# Patient Record
Sex: Male | Born: 1955 | Race: White | Hispanic: No | Smoking: Current some day smoker
Health system: Southern US, Community
[De-identification: ages and names within clinical notes are randomized; demographics above are authoritative.]

## PROBLEM LIST (undated history)

## (undated) DIAGNOSIS — K219 Gastro-esophageal reflux disease without esophagitis: Secondary | ICD-10-CM

## (undated) DIAGNOSIS — R0602 Shortness of breath: Secondary | ICD-10-CM

## (undated) DIAGNOSIS — B49 Unspecified mycosis: Secondary | ICD-10-CM

## (undated) DIAGNOSIS — Z8672 Personal history of thrombophlebitis: Secondary | ICD-10-CM

## (undated) DIAGNOSIS — K2960 Other gastritis without bleeding: Secondary | ICD-10-CM

## (undated) DIAGNOSIS — K208 Other esophagitis without bleeding: Secondary | ICD-10-CM

## (undated) DIAGNOSIS — F319 Bipolar disorder, unspecified: Secondary | ICD-10-CM

## (undated) DIAGNOSIS — I2510 Atherosclerotic heart disease of native coronary artery without angina pectoris: Secondary | ICD-10-CM

## (undated) DIAGNOSIS — M545 Low back pain, unspecified: Secondary | ICD-10-CM

## (undated) DIAGNOSIS — E118 Type 2 diabetes mellitus with unspecified complications: Secondary | ICD-10-CM

## (undated) DIAGNOSIS — I219 Acute myocardial infarction, unspecified: Secondary | ICD-10-CM

## (undated) DIAGNOSIS — E785 Hyperlipidemia, unspecified: Secondary | ICD-10-CM

## (undated) DIAGNOSIS — G8929 Other chronic pain: Secondary | ICD-10-CM

## (undated) DIAGNOSIS — J449 Chronic obstructive pulmonary disease, unspecified: Secondary | ICD-10-CM

## (undated) DIAGNOSIS — J189 Pneumonia, unspecified organism: Secondary | ICD-10-CM

## (undated) DIAGNOSIS — E669 Obesity, unspecified: Secondary | ICD-10-CM

## (undated) DIAGNOSIS — I252 Old myocardial infarction: Secondary | ICD-10-CM

## (undated) DIAGNOSIS — I209 Angina pectoris, unspecified: Secondary | ICD-10-CM

## (undated) DIAGNOSIS — J45909 Unspecified asthma, uncomplicated: Secondary | ICD-10-CM

## (undated) DIAGNOSIS — M1990 Unspecified osteoarthritis, unspecified site: Secondary | ICD-10-CM

## (undated) DIAGNOSIS — J42 Unspecified chronic bronchitis: Secondary | ICD-10-CM

## (undated) DIAGNOSIS — Z22322 Carrier or suspected carrier of Methicillin resistant Staphylococcus aureus: Secondary | ICD-10-CM

## (undated) DIAGNOSIS — Z9049 Acquired absence of other specified parts of digestive tract: Secondary | ICD-10-CM

## (undated) DIAGNOSIS — F419 Anxiety disorder, unspecified: Secondary | ICD-10-CM

## (undated) DIAGNOSIS — I82409 Acute embolism and thrombosis of unspecified deep veins of unspecified lower extremity: Secondary | ICD-10-CM

## (undated) DIAGNOSIS — E7800 Pure hypercholesterolemia, unspecified: Secondary | ICD-10-CM

## (undated) DIAGNOSIS — F329 Major depressive disorder, single episode, unspecified: Secondary | ICD-10-CM

## (undated) DIAGNOSIS — J984 Other disorders of lung: Secondary | ICD-10-CM

## (undated) DIAGNOSIS — G894 Chronic pain syndrome: Secondary | ICD-10-CM

## (undated) DIAGNOSIS — F250 Schizoaffective disorder, bipolar type: Secondary | ICD-10-CM

## (undated) DIAGNOSIS — I10 Essential (primary) hypertension: Secondary | ICD-10-CM

## (undated) DIAGNOSIS — E039 Hypothyroidism, unspecified: Secondary | ICD-10-CM

## (undated) DIAGNOSIS — K5732 Diverticulitis of large intestine without perforation or abscess without bleeding: Secondary | ICD-10-CM

## (undated) DIAGNOSIS — I509 Heart failure, unspecified: Secondary | ICD-10-CM

## (undated) DIAGNOSIS — Z95820 Peripheral vascular angioplasty status with implants and grafts: Secondary | ICD-10-CM

## (undated) HISTORY — DX: Other gastritis without bleeding: K29.60

## (undated) HISTORY — PX: TONSILLECTOMY AND ADENOIDECTOMY: SUR1326

## (undated) HISTORY — DX: Shortness of breath: R06.02

## (undated) HISTORY — DX: Carrier or suspected carrier of methicillin resistant Staphylococcus aureus: Z22.322

## (undated) HISTORY — DX: Acquired absence of other specified parts of digestive tract: Z90.49

## (undated) HISTORY — DX: Acute myocardial infarction, unspecified: I21.9

## (undated) HISTORY — PX: KNEE ARTHROSCOPY: SHX127

## (undated) HISTORY — DX: Schizoaffective disorder, bipolar type: F25.0

## (undated) HISTORY — DX: Chronic pain syndrome: G89.4

## (undated) HISTORY — DX: Pneumonia, unspecified organism: J18.9

## (undated) HISTORY — DX: Gastro-esophageal reflux disease without esophagitis: K21.9

## (undated) HISTORY — PX: APPENDECTOMY: SHX54

## (undated) HISTORY — DX: Atherosclerotic heart disease of native coronary artery without angina pectoris: I25.10

## (undated) HISTORY — PX: INGUINAL HERNIA REPAIR: SUR1180

## (undated) HISTORY — DX: Hyperlipidemia, unspecified: E78.5

## (undated) HISTORY — DX: Major depressive disorder, single episode, unspecified: F32.9

## (undated) HISTORY — DX: Other disorders of lung: J98.4

## (undated) HISTORY — DX: Type 2 diabetes mellitus with unspecified complications: E11.8

## (undated) HISTORY — DX: Unspecified mycosis: B49

## (undated) HISTORY — DX: Other esophagitis: K20.8

## (undated) HISTORY — DX: Other esophagitis without bleeding: K20.80

## (undated) HISTORY — DX: Diverticulitis of large intestine without perforation or abscess without bleeding: K57.32

## (undated) HISTORY — PX: LAPAROSCOPIC CHOLECYSTECTOMY: SUR755

## (undated) HISTORY — DX: Old myocardial infarction: I25.2

## (undated) HISTORY — DX: Personal history of thrombophlebitis: Z86.72

## (undated) HISTORY — DX: Obesity, unspecified: E66.9

---

## 1989-02-21 DIAGNOSIS — I82409 Acute embolism and thrombosis of unspecified deep veins of unspecified lower extremity: Secondary | ICD-10-CM

## 1989-02-21 HISTORY — DX: Acute embolism and thrombosis of unspecified deep veins of unspecified lower extremity: I82.409

## 2000-02-22 DIAGNOSIS — F259 Schizoaffective disorder, unspecified: Secondary | ICD-10-CM

## 2000-02-22 HISTORY — DX: Schizoaffective disorder, unspecified: F25.9

## 2004-05-18 ENCOUNTER — Observation Stay (HOSPITAL_COMMUNITY): Admission: EM | Admit: 2004-05-18 | Discharge: 2004-05-19 | Payer: Self-pay | Admitting: Emergency Medicine

## 2004-09-30 ENCOUNTER — Ambulatory Visit: Payer: Self-pay | Admitting: Psychiatry

## 2004-09-30 ENCOUNTER — Other Ambulatory Visit (HOSPITAL_COMMUNITY): Admission: RE | Admit: 2004-09-30 | Discharge: 2004-10-15 | Payer: Self-pay | Admitting: Psychiatry

## 2004-10-29 ENCOUNTER — Inpatient Hospital Stay (HOSPITAL_COMMUNITY): Admission: RE | Admit: 2004-10-29 | Discharge: 2004-11-08 | Payer: Self-pay | Admitting: Psychiatry

## 2004-11-10 ENCOUNTER — Ambulatory Visit: Payer: Self-pay | Admitting: Psychiatry

## 2004-11-10 ENCOUNTER — Other Ambulatory Visit (HOSPITAL_COMMUNITY): Admission: RE | Admit: 2004-11-10 | Discharge: 2004-11-10 | Payer: Self-pay | Admitting: Psychiatry

## 2004-11-11 ENCOUNTER — Inpatient Hospital Stay (HOSPITAL_COMMUNITY): Admission: RE | Admit: 2004-11-11 | Discharge: 2004-11-18 | Payer: Self-pay | Admitting: Psychiatry

## 2004-11-22 ENCOUNTER — Other Ambulatory Visit (HOSPITAL_COMMUNITY): Admission: RE | Admit: 2004-11-22 | Discharge: 2004-12-08 | Payer: Self-pay | Admitting: Psychiatry

## 2004-12-13 ENCOUNTER — Inpatient Hospital Stay (HOSPITAL_COMMUNITY): Admission: EM | Admit: 2004-12-13 | Discharge: 2004-12-15 | Payer: Self-pay | Admitting: Emergency Medicine

## 2004-12-22 ENCOUNTER — Ambulatory Visit (HOSPITAL_COMMUNITY): Payer: Self-pay | Admitting: Psychiatry

## 2005-01-03 ENCOUNTER — Ambulatory Visit (HOSPITAL_COMMUNITY): Payer: Self-pay | Admitting: Psychiatry

## 2005-01-11 ENCOUNTER — Ambulatory Visit (HOSPITAL_COMMUNITY): Payer: Self-pay | Admitting: Psychiatry

## 2005-01-16 ENCOUNTER — Emergency Department (HOSPITAL_COMMUNITY): Admission: EM | Admit: 2005-01-16 | Discharge: 2005-01-17 | Payer: Self-pay | Admitting: *Deleted

## 2005-01-17 ENCOUNTER — Inpatient Hospital Stay (HOSPITAL_COMMUNITY): Admission: RE | Admit: 2005-01-17 | Discharge: 2005-01-24 | Payer: Self-pay | Admitting: Psychiatry

## 2005-01-17 ENCOUNTER — Ambulatory Visit: Payer: Self-pay | Admitting: Psychiatry

## 2005-01-21 ENCOUNTER — Ambulatory Visit: Payer: Self-pay | Admitting: Psychiatry

## 2005-01-31 ENCOUNTER — Ambulatory Visit (HOSPITAL_COMMUNITY): Payer: Self-pay | Admitting: Psychiatry

## 2005-09-27 ENCOUNTER — Emergency Department (HOSPITAL_COMMUNITY): Admission: EM | Admit: 2005-09-27 | Discharge: 2005-09-28 | Payer: Self-pay | Admitting: Emergency Medicine

## 2005-10-08 ENCOUNTER — Emergency Department (HOSPITAL_COMMUNITY): Admission: EM | Admit: 2005-10-08 | Discharge: 2005-10-08 | Payer: Self-pay | Admitting: Emergency Medicine

## 2007-04-03 ENCOUNTER — Ambulatory Visit: Payer: Self-pay | Admitting: Internal Medicine

## 2007-04-03 ENCOUNTER — Emergency Department (HOSPITAL_COMMUNITY): Admission: EM | Admit: 2007-04-03 | Discharge: 2007-04-03 | Payer: Self-pay | Admitting: Emergency Medicine

## 2007-04-03 ENCOUNTER — Emergency Department (HOSPITAL_COMMUNITY): Admission: EM | Admit: 2007-04-03 | Discharge: 2007-04-03 | Payer: Self-pay | Admitting: Family Medicine

## 2007-04-18 ENCOUNTER — Ambulatory Visit: Payer: Self-pay | Admitting: *Deleted

## 2007-04-18 ENCOUNTER — Ambulatory Visit: Payer: Self-pay | Admitting: Internal Medicine

## 2007-07-01 ENCOUNTER — Emergency Department (HOSPITAL_COMMUNITY): Admission: EM | Admit: 2007-07-01 | Discharge: 2007-07-02 | Payer: Self-pay | Admitting: Emergency Medicine

## 2007-07-02 ENCOUNTER — Ambulatory Visit: Payer: Self-pay | Admitting: Internal Medicine

## 2007-12-07 ENCOUNTER — Ambulatory Visit: Payer: Self-pay | Admitting: Family Medicine

## 2008-01-27 ENCOUNTER — Inpatient Hospital Stay (HOSPITAL_COMMUNITY): Admission: EM | Admit: 2008-01-27 | Discharge: 2008-02-01 | Payer: Self-pay | Admitting: Emergency Medicine

## 2008-01-27 ENCOUNTER — Ambulatory Visit: Payer: Self-pay | Admitting: Internal Medicine

## 2008-01-29 ENCOUNTER — Encounter (INDEPENDENT_AMBULATORY_CARE_PROVIDER_SITE_OTHER): Payer: Self-pay | Admitting: Gastroenterology

## 2008-01-30 ENCOUNTER — Encounter (INDEPENDENT_AMBULATORY_CARE_PROVIDER_SITE_OTHER): Payer: Self-pay | Admitting: Gastroenterology

## 2008-02-22 DIAGNOSIS — I219 Acute myocardial infarction, unspecified: Secondary | ICD-10-CM

## 2008-02-22 HISTORY — DX: Acute myocardial infarction, unspecified: I21.9

## 2008-02-22 HISTORY — PX: CORONARY ANGIOPLASTY WITH STENT PLACEMENT: SHX49

## 2008-07-02 ENCOUNTER — Emergency Department (HOSPITAL_COMMUNITY): Admission: EM | Admit: 2008-07-02 | Discharge: 2008-07-02 | Payer: Self-pay | Admitting: Emergency Medicine

## 2008-07-07 ENCOUNTER — Emergency Department (HOSPITAL_COMMUNITY): Admission: EM | Admit: 2008-07-07 | Discharge: 2008-07-07 | Payer: Self-pay | Admitting: Emergency Medicine

## 2008-07-24 ENCOUNTER — Ambulatory Visit (HOSPITAL_COMMUNITY): Admission: RE | Admit: 2008-07-24 | Discharge: 2008-07-24 | Payer: Self-pay | Admitting: Family Medicine

## 2008-11-19 ENCOUNTER — Inpatient Hospital Stay (HOSPITAL_COMMUNITY): Admission: EM | Admit: 2008-11-19 | Discharge: 2008-11-21 | Payer: Self-pay | Admitting: Cardiology

## 2008-11-19 ENCOUNTER — Encounter: Payer: Self-pay | Admitting: Cardiology

## 2008-11-19 ENCOUNTER — Ambulatory Visit: Payer: Self-pay | Admitting: Cardiology

## 2008-11-20 ENCOUNTER — Encounter: Payer: Self-pay | Admitting: Cardiology

## 2008-11-25 ENCOUNTER — Telehealth: Payer: Self-pay | Admitting: Cardiology

## 2008-11-26 ENCOUNTER — Encounter: Payer: Self-pay | Admitting: Cardiology

## 2008-12-05 DIAGNOSIS — J449 Chronic obstructive pulmonary disease, unspecified: Secondary | ICD-10-CM

## 2008-12-05 DIAGNOSIS — J984 Other disorders of lung: Secondary | ICD-10-CM

## 2008-12-05 DIAGNOSIS — I2510 Atherosclerotic heart disease of native coronary artery without angina pectoris: Secondary | ICD-10-CM

## 2008-12-05 DIAGNOSIS — K2960 Other gastritis without bleeding: Secondary | ICD-10-CM

## 2008-12-05 DIAGNOSIS — E669 Obesity, unspecified: Secondary | ICD-10-CM

## 2008-12-05 DIAGNOSIS — F319 Bipolar disorder, unspecified: Secondary | ICD-10-CM

## 2008-12-05 DIAGNOSIS — F329 Major depressive disorder, single episode, unspecified: Secondary | ICD-10-CM

## 2008-12-05 DIAGNOSIS — I252 Old myocardial infarction: Secondary | ICD-10-CM

## 2008-12-05 DIAGNOSIS — K219 Gastro-esophageal reflux disease without esophagitis: Secondary | ICD-10-CM

## 2008-12-05 DIAGNOSIS — F17200 Nicotine dependence, unspecified, uncomplicated: Secondary | ICD-10-CM | POA: Insufficient documentation

## 2008-12-05 DIAGNOSIS — Z8672 Personal history of thrombophlebitis: Secondary | ICD-10-CM

## 2008-12-05 DIAGNOSIS — I119 Hypertensive heart disease without heart failure: Secondary | ICD-10-CM

## 2008-12-05 DIAGNOSIS — E785 Hyperlipidemia, unspecified: Secondary | ICD-10-CM

## 2008-12-05 HISTORY — DX: Other disorders of lung: J98.4

## 2008-12-05 HISTORY — DX: Other gastritis without bleeding: K29.60

## 2008-12-05 HISTORY — DX: Atherosclerotic heart disease of native coronary artery without angina pectoris: I25.10

## 2008-12-05 HISTORY — DX: Old myocardial infarction: I25.2

## 2008-12-05 HISTORY — DX: Gastro-esophageal reflux disease without esophagitis: K21.9

## 2008-12-05 HISTORY — DX: Personal history of thrombophlebitis: Z86.72

## 2008-12-05 HISTORY — DX: Major depressive disorder, single episode, unspecified: F32.9

## 2008-12-05 HISTORY — DX: Obesity, unspecified: E66.9

## 2008-12-09 ENCOUNTER — Encounter: Payer: Self-pay | Admitting: Nurse Practitioner

## 2008-12-09 ENCOUNTER — Ambulatory Visit: Payer: Self-pay | Admitting: Cardiology

## 2008-12-26 ENCOUNTER — Emergency Department (HOSPITAL_COMMUNITY): Admission: EM | Admit: 2008-12-26 | Discharge: 2008-12-26 | Payer: Self-pay | Admitting: Emergency Medicine

## 2009-01-05 ENCOUNTER — Observation Stay (HOSPITAL_COMMUNITY): Admission: EM | Admit: 2009-01-05 | Discharge: 2009-01-05 | Payer: Self-pay | Admitting: Emergency Medicine

## 2009-03-26 ENCOUNTER — Encounter (INDEPENDENT_AMBULATORY_CARE_PROVIDER_SITE_OTHER): Payer: Self-pay | Admitting: *Deleted

## 2010-03-23 NOTE — Letter (Signed)
Summary: Appointment - Missed  [facility], Main Office  [address]   [place], [place] [address]   Phone: [phone]  Fax: [phone]     March 26, 2009 MRN: [id number]   [name] [address] [place], [place]  [address]   Dear [name],  Our records indicate you missed your appointment on March 18, 2009  with Dr. Juanda Chance. It is very important that we reach you to reschedule this appointment. We look forward to participating in your health care needs. Please contact us at the number listed above at your earliest convenience to reschedule this appointment.     Sincerely, Neurosurgeon Team LG

## 2010-05-26 LAB — COMPREHENSIVE METABOLIC PANEL
ALT: 23 U/L (ref 0–53)
AST: 27 U/L (ref 0–37)
Albumin: 4.2 g/dL (ref 3.5–5.2)
Alkaline Phosphatase: 55 U/L (ref 39–117)
BUN: 5 mg/dL — ABNORMAL LOW (ref 6–23)
CO2: 24 mEq/L (ref 19–32)
Calcium: 8.7 mg/dL (ref 8.4–10.5)
Chloride: 104 mEq/L (ref 96–112)
Creatinine, Ser: 0.71 mg/dL (ref 0.4–1.5)
GFR calc Af Amer: >60 mL/min (ref >60)
GFR calc non Af Amer: >60 mL/min (ref >60)
Glucose, Bld: 105 mg/dL — ABNORMAL HIGH (ref 70–99)
Potassium: 3.3 mEq/L — ABNORMAL LOW (ref 3.5–5.1)
Sodium: 137 mEq/L (ref 135–145)
Total Bilirubin: 0.6 mg/dL (ref 0.3–1.2)
Total Protein: 7.2 g/dL (ref 6.0–8.3)

## 2010-05-26 LAB — URINALYSIS, ROUTINE W REFLEX MICROSCOPIC
Bilirubin Urine: NEGATIVE
Glucose, UA: NEGATIVE mg/dL
Hgb urine dipstick: NEGATIVE
Ketones, ur: NEGATIVE mg/dL
Nitrite: NEGATIVE
Protein, ur: NEGATIVE mg/dL
Specific Gravity, Urine: 1.012 (ref 1.005–1.030)
Urobilinogen, UA: 2 mg/dL — ABNORMAL HIGH (ref 0.0–1.0)
pH: 6 (ref 5.0–8.0)

## 2010-05-26 LAB — CBC
HCT: 41.6 % (ref 39.0–52.0)
Hemoglobin: 14.6 g/dL (ref 13.0–17.0)
MCHC: 35.1 g/dL (ref 30.0–36.0)
MCV: 92.8 fL (ref 78.0–100.0)
Platelets: 149 10*3/uL — ABNORMAL LOW (ref 150–400)
RBC: 4.48 MIL/uL (ref 4.22–5.81)
RDW: 14 % (ref 11.5–15.5)
WBC: 7.1 10*3/uL (ref 4.0–10.5)

## 2010-05-26 LAB — DIFFERENTIAL
Basophils Absolute: 0 10*3/uL (ref 0.0–0.1)
Basophils Relative: 0 % (ref 0–1)
Eosinophils Absolute: 0.1 10*3/uL (ref 0.0–0.7)
Eosinophils Relative: 2 % (ref 0–5)
Lymphocytes Relative: 24 % (ref 12–46)
Lymphs Abs: 1.7 10*3/uL (ref 0.7–4.0)
Monocytes Absolute: 0.4 10*3/uL (ref 0.1–1.0)
Monocytes Relative: 6 % (ref 3–12)
Neutro Abs: 4.8 10*3/uL (ref 1.7–7.7)
Neutrophils Relative %: 68 % (ref 43–77)

## 2010-05-26 LAB — LIPASE, BLOOD: Lipase: 11 U/L (ref 11–59)

## 2010-05-28 LAB — CBC
HCT: 42.5 % (ref 39.0–52.0)
Hemoglobin: 14.7 g/dL (ref 13.0–17.0)
MCHC: 34.4 g/dL (ref 30.0–36.0)
MCV: 93.5 fL (ref 78.0–100.0)
Platelets: 138 10*3/uL — ABNORMAL LOW (ref 150–400)
Platelets: 161 10*3/uL (ref 150–400)
RBC: 4.6 MIL/uL (ref 4.22–5.81)
WBC: 7.5 10*3/uL (ref 4.0–10.5)
WBC: 8 10*3/uL (ref 4.0–10.5)

## 2010-05-28 LAB — CARDIAC PANEL(CRET KIN+CKTOT+MB+TROPI)
CK, MB: 2.1 ng/mL (ref 0.3–4.0)
CK, MB: 31 ng/mL — ABNORMAL HIGH (ref 0.3–4.0)
Relative Index: INVALID (ref 0.0–2.5)
Total CK: 382 U/L — ABNORMAL HIGH (ref 7–232)
Total CK: 495 U/L — ABNORMAL HIGH (ref 7–232)
Total CK: 67 U/L (ref 7–232)
Troponin I: 0.06 ng/mL (ref 0.00–0.06)
Troponin I: 7.36 ng/mL (ref 0.00–0.06)

## 2010-05-28 LAB — HEMOGLOBIN A1C: Mean Plasma Glucose: 103 mg/dL

## 2010-05-28 LAB — TSH: TSH: 2.211 u[IU]/mL (ref 0.350–4.500)

## 2010-05-28 LAB — COMPREHENSIVE METABOLIC PANEL
AST: 51 U/L — ABNORMAL HIGH (ref 0–37)
Albumin: 3.5 g/dL (ref 3.5–5.2)
Albumin: 4.1 g/dL (ref 3.5–5.2)
Alkaline Phosphatase: 55 U/L (ref 39–117)
BUN: 7 mg/dL (ref 6–23)
CO2: 20 mEq/L (ref 19–32)
Calcium: 8.1 mg/dL — ABNORMAL LOW (ref 8.4–10.5)
Chloride: 105 mEq/L (ref 96–112)
Creatinine, Ser: 0.87 mg/dL (ref 0.4–1.5)
GFR calc Af Amer: >60 mL/min (ref >60)
GFR calc non Af Amer: >60 mL/min (ref >60)
Glucose, Bld: 136 mg/dL — ABNORMAL HIGH (ref 70–99)
Potassium: 3.6 mEq/L (ref 3.5–5.1)
Total Bilirubin: 0.8 mg/dL (ref 0.3–1.2)

## 2010-05-28 LAB — POCT I-STAT, CHEM 8
BUN: 6 mg/dL (ref 6–23)
Creatinine, Ser: 0.8 mg/dL (ref 0.4–1.5)
Sodium: 137 mEq/L (ref 135–145)
TCO2: 19 mmol/L (ref 0–100)

## 2010-05-28 LAB — LIPID PANEL
HDL: 25 mg/dL — ABNORMAL LOW (ref >39)
Triglycerides: 436 mg/dL — ABNORMAL HIGH (ref <150)
VLDL: UNDETERMINED mg/dL (ref 0–40)

## 2010-07-06 NOTE — Op Note (Signed)
NAME:  [name], CALI NO.:  [id number]   MEDICAL RECORD NO.:  [id number]          PATIENT TYPE:  INP   LOCATION:  5532                         FACILITY:  [facility]   PHYSICIAN:  Danise Edge, M.D.   DATE OF BIRTH:  26-Oct-1955   DATE OF PROCEDURE:  01/29/2008  DATE OF DISCHARGE:                               OPERATIVE REPORT   PROCEDURE INDICATIONS:  [name] is a 55 year old male, born  07-Apr-1955.  [name] was admitted to [facility] to  evaluate abdominal pain, diarrhea, and Hemoccult positive stool.  He  underwent a CT scan of the abdomen and pelvis which revealed thickening  in the distal esophageal wall.   ENDOSCOPIST:  Danise Edge, MD   PREMEDICATIONS:  Fentanyl 75 mcg and Versed 7.5 mg.   PROCEDURE:  After obtaining informed consent, [name] was placed in  the left lateral decubitus position.  I administered intravenous  fentanyl and intravenous Versed to achieve conscious sedation for the  procedure.  The patient's blood pressure, oxygen saturation, and cardiac  rhythm were monitored throughout the procedure and documented in the  medical record.   The Pentax gastroscope was passed through the posterior hypopharynx into  the proximal esophagus without difficulty.  The hypopharynx, larynx, and  vocal cords appeared normal.   Esophagoscopy:  The proximal and mid segments of the esophageal mucosa  appeared normal.  There are linear erosions in the distal esophagus  extending up from the esophagogastric junction which is noted at  approximately 45 cm from the incisor teeth.  There is no endoscopic  evidence for the presence of Barrett esophagus or esophageal tumors.  Biopsies were performed from the distal esophagus.   Gastroscopy:  Retroflex view of the gastric cardia and fundus was  normal.  The gastric body, antrum, and pylorus appeared normal.  The  pylorus is patent.   Duodenoscopy:  The duodenal bulb and descending  duodenum appeared  normal.   ASSESSMENT:  Gastroesophageal reflux associated with linear erosions in  the distal esophagus, but no endoscopic evidence for the presence of  Barrett esophagus or esophageal tumors.  Distal esophageal biopsies were  performed.   RECOMMENDATIONS:  1. Continue proton pump inhibitor at each morning.  2. Schedule colonoscopy to evaluate guaiac-positive stool.           ______________________________  Danise Edge, M.D.    MJ/MEDQ  D:  01/29/2008  T:  01/29/2008  Job:  [id number]

## 2010-07-06 NOTE — Consult Note (Signed)
[name], [name]:  [id number]   MEDICAL RECORD NO.:  [id number]          PATIENT TYPE:  INP   LOCATION:  5532                         FACILITY:  [facility]   PHYSICIAN:  Danise Edge, M.D.   DATE OF BIRTH:  08-08-1955   DATE OF CONSULTATION:  01/28/2008  DATE OF DISCHARGE:                                 CONSULTATION   PRIMARY CARE PHYSICIAN:  Windle Guard, MD in [place].   PRIMARY GASTROENTEROLOGIST:  None.   Consulting gastroenterologist:  Dr. Vida Rigger   REASON FOR CONSULTATION:  Epigastric pain, FOBT positive, question of  esophageal thickening on CT scan.   HISTORY OF PRESENT ILLNESS:  The patient is a 55 year old male with past  medical history of bipolar disorder, GERD, dyslipidemia, COPD, obesity,  and suicide attempt in 2006, who presented to the emergency room via EMS  complaining of upper abdominal pain that started the day prior to  admission and diarrhea that started the day of admission.  He notes  upper abdominal pain that is constant and aching in nature and  associated with nausea, but no vomiting.  He then developed diarrhea the  next day, but denies melena or hematochezia.  He has not traveled  anywhere recently and has not eaten anything out of the ordinary.  He  resides in a nursing home and several of the other residents have had GI  upset recently.  He denies any recent antibiotic use.  He notes using  ibuprofen 400 mg b.i.d. for 1 week last week, but no other NSAID use.  He notes starting niacin the day before his symptoms started, but no  other medication changes have been made.  He says he had EGD and  colonoscopy about 30 years ago and was notable for GERD.  He has no  family history of GI malignancy.  He has had a 40-to- 50-pound weight  loss in the last year, but notes that this has been intentional and has  been exercising more.  He denies fevers, chills, hematochezia,  hematemesis, and melena.  He also notes URI  symptoms for the last 5 days  prior to admission.  He uses tobacco, but denies any illegal drugs or  alcohol.   PAST MEDICAL HISTORY:  1. GERD - EGD/colonoscopy 30 years ago.  2. Bipolar disorder, followed by the [facility].  3. History of suicide attempts in 2006.  4. Obesity.  5. Dyslipidemia.  6. Tobacco dependence.  7. Herniated disk.  8. COPD.  9. History of DVT, post knee arthroscopy, status post Coumadin      therapy.   PAST SURGICAL HISTORY:  1. Appendectomy.  2. Hernia repair.  3. Tonsillectomy.  4. Knee arthroscopy.   MEDICATIONS:  1. Neurontin 600 mg p.o. t.i.d.  2. Trazodone 300 mg p.o. nightly.  3. Benztropine 1 mg p.o. b.i.d.  4. Cymbalta 60 mg p.o. b.i.d.  5. Seroquel 30 mg p.o. t.i.d.  6. Flexeril p.r.n.  7. Albuterol p.r.n.  8. Protonix 40 mg p.o. daily.  9. Niacin p.o. daily.  10.Ambien p.o. nightly p.r.n.  11.Sudafed p.r.n.   ALLERGIES:  COMPAZINE.   SOCIAL HISTORY:  He is a divorced gentleman who currently resides in a  nursing home.  He is on disability.  He smokes a half a pack a day for  many years, but denies alcohol.  No illegal drugs.   FAMILY HISTORY:  He notes coronary artery disease in his parents.  His  grandmother had breast cancer.  No history of GI malignancy in any  family members.   PHYSICAL EXAMINATION:  VITAL SIGNS:  Temperature max 98.8, blood  pressure 96/59, heart rate 90, respiratory rate 16, and O2 sat 95% on  room air.  GENERAL:  He is alert and oriented x3, appears slightly anxious.  HEENT:  Sclerae anicteric, moist mucous membranes, NG tube in place.  CARDIOVASCULAR:  Regular rate and rhythm, distant heart sounds, no  murmurs, rubs, or gallops.  LUNGS:  Clear to auscultation bilaterally anteriorly.  ABDOMEN:  Hypoactive bowel sounds, obese, soft, nondistended, tender to  palpation in the right upper quadrant, epigastrium, and left upper  quadrant without rebound or guarding.  Well-healed incision noted in the   right lower quadrant.  EXTREMITIES:  No peripheral edema.   LABORATORY DATA:  White blood cell count 10.6, hemoglobin 15.2, platelet  count 150, and MCV 93.2.  Sodium 137, potassium 4.7, chloride 103,  bicarb 24, BUN 9, creatinine 0.88, glucose 100, total bilirubin 1.0,  alkaline phosphatase 54, AST 28, ALT 25, total protein 6.7, albumin 4.0,  and calcium 9.3.  Hemoccult positive, 1out of 2.  TSH 3.622, free T4 is  0.92, LDH 140, and lipase 15.  Lactic acid 3.1.  Urinalysis within  normal limits except for elevated specific gravity of greater than  1.046.   DIAGNOSTIC IMAGING:  1. Acute abdominal series with a question of ileus versus small bowel      obstructions.  2. CAT scan of the abdomen and pelvis with:      a.     Esophageal wall thickening.      b.     Question of partial small bowel obstruction.      c.     A 6-mm pulmonary nodule.   ASSESSMENT/PLAN:  The patient is a 55 year old male with:  1. Epigastric pain/diarrhea:  Unsure if these are actually related at      this point.  Differential to include infectious gastroenteritis      such as viral, Clostridium difficile given history of residing in a      nursing home, esophagitis, peptic ulcer disease, given that he is      hemoccult positive and having significant pain.  I will also      consider malignancy given his history of weight loss.  Medications      may also be contributing to his symptoms.  At this point, we will      proceed with EGD tomorrow to evaluate the esophagus given      abnormality seen on CAT scan.  We will discontinue the patient's NG-      tube given that he is having bowel movements and so does not have a      complete small bowel obstruction and we will start on clear      liquids.  We will make him n.p.o. after midnight for EGD tomorrow.      We will provide symptomatic relief if needed.  Will followup his      stool studies to include stools for enteric pathogens, fecal      lactoferrin,  and C.  diff toxin.  The patient may also benefit from      colonoscopy at some point, especially given that he is hemoccult      positive, however, pending the results of his EGD, this may be able      to be done in the outpatient setting.      Joaquin Courts, MD  Electronically Signed     ______________________________  Danise Edge, M.D.    VW/MEDQ  D:  01/28/2008  T:  01/29/2008  Job:  [id number]

## 2010-07-06 NOTE — Op Note (Signed)
Kenneth, Ray NO.:  [id number]   MEDICAL RECORD NO.:  [id number]          PATIENT TYPE:  INP   LOCATION:  5532                         FACILITY:  [facility]   PHYSICIAN:  Danise Edge, M.D.   DATE OF BIRTH:  1955/06/18   DATE OF PROCEDURE:  01/30/2008  DATE OF DISCHARGE:                               OPERATIVE REPORT   REFERRING PHYSICIAN:  Alvester Morin, MD   OPERATION:  Colonoscopy with colonic biopsy.   PROCEDURE INDICATIONS:  [name] is a 55 year old male born on  [birth year].  [name] to the hospital to evaluate  nonbloody diarrhea and Hemoccult-positive stool.  A CT scan of the  abdomen and pelvis revealed thickening in his distal esophagus.  An  esophagogastroduodenoscopy performed yesterday was essentially normal  except for linear erosions in the distal esophagus.  To evaluate his  guaiac-positive stool, he is undergoing a diagnostic colonoscopy.  To  rule out microscopic colitis, random colonic biopsies will be performed.   ENDOSCOPIST:  Danise Edge, MD   PREMEDICATIONS:  1. Fentanyl 75 mcg.  2. Versed 5 mg.   PROCEDURE:  After obtaining informed consent, [name] was placed in  the left lateral decubitus position.  I administered intravenous  fentanyl and intravenous Versed to achieve conscious sedation for the  procedure.  The patient's blood pressure, oxygen saturation, and cardiac  rhythm were monitored throughout the procedure and documented in the  medical record.   An anal inspection and digital rectal exam were normal.  The Pentax  pediatric colonoscope was introduced into the rectum and advanced to the  mid ascending colon.  With a fair amount of difficulty rolling the  patient from the left lateral decubitus position to the supine position  and the right lateral decubitus position applying abdominal pressure, I  eventually was able to advance the endoscope into the cecum.  Colonic  preparation  for the exam today was good.   Rectum normal.  Retroflexed view of the distal rectum normal.   Sigmoid colon and descending colon.  A few sigmoid colonic diverticula  are noted.  Otherwise, the exam was normal.   Splenic flexure normal.   Transverse colon normal.   Hepatic flexure normal.   Descending colon.  A few diverticula are present in the ascending colon.   Cecum and ileocecal valve normal.   BIOPSIES:  Random colonic biopsies were taken from the right colon and  left colon to rule out microscopic colitis.   ASSESSMENT:  Normal proctocolonoscopy to the cecum.  A few diverticula  are noted in the ascending colon and sigmoid colon.  No endoscopic  evidence for the presence of inflammatory bowel disease or colorectal  neoplasia.  Colonic biopsies to rule out microscopic colitis pending.   RECOMMENDATIONS:  Repeat screening colonoscopy to prevent colon cancer  in 10 years.           ______________________________  Danise Edge, M.D.     MJ/MEDQ  D:  01/30/2008  T:  01/30/2008  Job:  [id number]   cc:   [name], M.D.

## 2010-07-06 NOTE — Discharge Summary (Signed)
NAMEMANDRELL, VANGILDER NO.:  [id number]   MEDICAL RECORD NO.:  [id number]          PATIENT TYPE:  INP   LOCATION:  5501                         FACILITY:  [facility]   PHYSICIAN:  Ileana Roup, M.D.  DATE OF BIRTH:  1955-12-25   DATE OF ADMISSION:  01/27/2008  DATE OF DISCHARGE:  02/01/2008                               DISCHARGE SUMMARY   ADDENDUM   The patient has to stay in the hospital for extra 1 day.  Now his vital  signs have been stable during the extra 1 day.  There are no significant  changes to the discharge summary.  The patient is advised to continue  all the medication that I mentioned in the previously dictated discharge  summary.  The patient is to go to his assisted living facility [facility] [employer]) and he is to follow up with his primary  care physician, Dr. Jeannetta Nap, on February 18, 2008, at 10:30 a.m.  Apparently on the day of discharge, the patient's heparin antibody  screen confirmatory test came back negative, so the patient is not  allergic to heparin.      Blondell Reveal, MD  Electronically Signed      Ileana Roup, M.D.  Electronically Signed    VB/MEDQ  D:  02/01/2008  T:  02/01/2008  Job:  [id number]   cc:   Buren Kos, M.D.

## 2010-07-06 NOTE — Discharge Summary (Signed)
[name], OBESO NO.:  [id number]   MEDICAL RECORD NO.:  [id number]          PATIENT TYPE:  INP   LOCATION:  5501                         FACILITY:  [facility]   PHYSICIAN:  Ileana Roup, M.D.  DATE OF BIRTH:  Dec 19, 1955   DATE OF ADMISSION:  01/27/2008  DATE OF DISCHARGE:                               DISCHARGE SUMMARY   DISCHARGE DIAGNOSES   1. Abdominal pain, most likely secondary to erosive esophagitis (upper      GI endoscopy proven).  2. Lung nodule in left lower lobe (6-mm size on CT scan), needs repeat      CT scan in 6 months.  3. Chronic obstructive pulmonary disease/emphysema.  4. Bipolar disease disorder/major depression.  5. Herniated disk at L4-L5.  6. Obesity.  7. Hypertension, not sure if the patient really has high blood      pressure, not on any medications.  8. Gastroesophageal reflux disease.  9. Hyperlipidemia.  10.History of provoked deep vein thrombosis after arthroscopy of knee.  11.History of Klonopin overdose in 2006.  12.Heparin antibody screen positive, confirmatory test pending.  13.Tobacco abuse, ongoing.  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   DISCHARGE MEDICATIONS   1. Neurontin 600 mg 1 pill 3 times daily.  2. Trazodone 300 mg 1 pill at bedtime.  3. Benztropine 1 mg 1 pill 2 times daily.  4. Cymbalta 60 mg 1 pill 2 times daily.  5. Seroquel 300 mg 1 pill 3 times daily.  6. Cyclobenzaprine 1 pill as needed.  7. Albuterol 1-2 puffs as needed for shortness of breath.  8. Pravastatin 40 mg 1 pill once daily.  9. Protonix 40 mg 1 pill once daily 30 minutes before breakfast.  10.Ambien 10 mg 1 pill at bedtime as needed for insomnia.  11.Percocet 10/650 mg 1 pill every 6 hours as needed for pain.  12.Sudafed 1 tablet as needed for nasal congestion.  13.The patient is advised to stop smoking.   DISPOSITION AND FOLLOWUP   The  patient is to follow up with Dr. Jeannetta Nap ([facility]) on February 18, 2008, at 10:30 a.m. the phone number of Dr.  Jeannetta Nap is [phone].  Dr. Jeannetta Nap is to monitor the patient's abdominal  pain.  Dr. Jeannetta Nap is to follow up with him in 6 months and he is to  advise regarding repeating CT scan of his chest to make sure that the  lung nodules are not increasing in size.  Dr. Jeannetta Nap is to advise  regarding smoking cessation.   PROCEDURES PERFORMED   1. Acute abdominal series done on January 27, 2008.  Impression, no      acute cardiopulmonary process.  Diffuse gaseous small bowel      distention with air scattered along the nondistended colon.  There      is a large volume of residual debris in the stomach.  Imaging      features could be secondary to severe ileus although small bowel      obstruction is a concern.  CT imaging maybe be helpful to further      evaluate.   1. CT of the abdomen and pelvis: impression, there is a      circumferential distal esophageal wall thickening maybe related to      reflux or esophagitis.  The followup endoscopy or barium swallow      would be helpful to further evaluate. A 6-mm pulmonary nodule is      seen in the left lower lobe.  The nodule is incompletely      visualized.  A 4-mm nodule is seen in the left lower lobe on image      30 and 2 small nodules in the left lower lobe.  A followup chest CT      could be performed at 6-12 months. Fluid-filled distended stomach      and proximal small bowel.  Diffuse fatty infiltration of the liver.      There is no focal abnormality in the liver or spleen.  The stomach      is distended.  The duodenum, pancreas, gallbladder, and retinal      glands have normal imaging features.  There is no intraperitoneal      free fluid.  No abdominal lymphadenopathy.  Scattered diverticula      are seen in the abdominal segments of the colon.  CT scan of the      pelvis shows impression of fluid-filled  distended small bowel in      the abdomen and pelvis.  This is associated with fluid-filled      distended stomach.  The patient has air and fluid in the distal      ilium and the proximal colon.  Imaging features may be secondary to      an early or partial small bowel obstruction.  Given the relative      lack of colon distention, ileus is considered less likely at this      time.  There is no evidence of bowel wall thickening.  No      intraperitoneal free fluid or mesenteric interloop fluid.   1. Upper GI endoscopy.  Gastroesophageal reflux associated with linear      erosions in the distal esophagus, but no endoscopic evidence for      the presence of Barrett esophagus or esophageal tumors.  Distal      esophageal biopsies were performed.  Recommend continuing the      proton pump inhibitors at each morning.  Scheduled colonoscopy to      evaluate guaiac-positive stools.   1. Colonoscopy: Assessment of the colonoscopy normal,      proctocolonoscopy to the cecum.  A few diverticula are noted in the      ascending colon and sigmoid colon.  No endoscopic evidence for the      presence of inflammatory bowel disease or colorectal neoplasia.      Recommendations include repeat screening colonoscopy to prevent      colon cancer in 10 years.   1. Biopsy of the esophagus. Inflamed gastroesophageal junction mucosa      with focal erosion.  No fungi, intestinal metaplasia, dysplasia, or      malignancy identified.   1. Biopsy of the colon, benign colonic mucosa.  No significant      inflammation or other abnormalities identified.  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    LABS  Serum lipase 15.  Comprehensive metabolic panel; sodium  134, potassium  4.4, chloride 102, carbon dioxide 19, glucose 134, BUN 11, creatinine  0.84, total bilirubin 0.8, alkaline phosphatase 61, AST 28, ALT 22,  total protein 7.7, albumin 4.7, and calcium 10.3.  Fecal  occult blood  test is negative.  Venous lactic acid level 3.1, which is slightly  elevated.  Blood alcohol level is less than 5, blood LDH level is 140,  hemoglobin A1c 5.3, TSH 3.22, free T4 0.92, blood acetaminophen level is  less than 10, and blood salicylate level is less than 4.  Urinalysis is  negative for nitrites and leukocytes.  Repeat fecal occult blood test is  positive.  Fasting lipid profile; total cholesterol 167, triglycerides  497, HDL cholesterol 30, LDL cholesterol unable to calculate because  triglycerides are over 400.  VLDL cholesterol unable to calculate  because triglycerides are more than 400.  Clostridium difficile toxins  negative.  Fecal lactoferrin positive.  GRDR screen negative.  Cryptosporidium screen negative.  Heparin antibody screen positive, but  this is sent for confirmation and confirmatory test is pending.  PTT 30,  PT 13.8, and INR 1.0.  Stool culture negative for Salmonella, Shigella,  Campylobacter, or Yersinia.   CONSULTATIONS   1. Gastrointestinal consultation: Dr. Danise Edge.  Dr. Laural Benes was      consulted for GI secondary to slightly elevated lactic acid levels      and to rule out the possibility of mesenteric ischemia and for a      possible colonoscopy for a positive fecal occult blood test.  Dr.      Laural Benes recommended upper GI endoscopy to rule out the upper GI      lesions and colonoscopy to rule out the possibility of colonic      neoplasia.   BRIEF ADMITTING HISTORY AND PHYSICAL   He is a 55 year old Caucasian with past medical history of COPD,  hypertension, hyperlipidemia, gastroesophageal reflux disease, obesity,  bipolar disease and bipolar disorder, and major depression, comes to the  emergency room with chief complaints of abdominal pain, diarrhea, and  nausea since 7 p.m. last night.  Complaints have started with diarrhea,  6 times, watery, nonbloody, no mucus.  Abdominal pain started in  umbilical area, crampy and  intermittent and lasted a few hours.  Then  abdominal pain started in upper abdomen (mostly epigastric) constant,  burning type, and 10/10 in severity associated with nausea.  The patient  denies any vomitings, fevers, sick contacts, or new food.  He had [place]  for dinner and other people who ate the same food had no similar  complaints.  He noticed some dark-colored urine this a.m.  Denies any  shortness of breath and chest pain.  Denies any other complaints.  Apparently, the patient describes significant weight loss (questionable  intentional) from 320 lbs to 276 lbs, over the last 1 year.  The patient  denies any fatigue, palpitations, or suicidal ideations.   PHYSICAL EXAMINATION   VITAL SIGNS:  Temperature 97.3, blood pressure 141/95, pulse rate 102,  respiratory 20, and oxygen saturation 96% on room air.  Weight 276 lbs.  GENERAL:  The patient is moderately obese, appears in mild distress  secondary to abdominal pain and looks dehydrated.  EYES:  Pupils equal, round, and reacting to light equal.  Extraocular  movements intact.  No anemia or icterus noted.  ENT:  Moist mucous membranes.  No thrush or erythema.  NECK:  Supple.  No lymph nodes.  No carotid bruit.  RESPIRATORY:  Clear to auscultation bilaterally.  No wheezes.  No  crepitus.  No rhonchi.  CARDIOVASCULAR:  S1 and S2 regular in rate and rhythm.  No murmurs.  No  rubs.  No gallops.  GASTROINTESTINAL:  Abdomen is soft, obese, questionable mild distention,  and tenderness to palpation in the epigastric area and right upper  quadrant and left upper quadrant areas.  No guarding.  No rigidity.  No  fluid wave.  Bowel sounds positive.  Guaiac is negative.  Normal rectal  tone.  EXTREMITIES:  No pedal edema.  SKIN:  Looks dehydrated.  No erythema or any lesions.  LYMPH:  No palpable lymphadenopathy.  The patient is moving all  extremities.  NEUROLOGIC:  Nonfocal.  Motor strength is 5/5 in all 4 extremities.  Sensation is  intact.  Cranial nerves are II through XII are grossly  intact.  Cerebellar signs intact.  PSYCH:  Appropriate.   LABS AT THE TIME OF PRESENTATION   1. At the  time of presentation, CBC; white count of 11.9, RBC 5.29,      hemoglobin 16.7, hematocrit 49.3, MCV 93.2, MCHC 33.9, RDW 13.4,      and platelet count 182.  2. Comprehensive metabolic panel; sodium 134, potassium 4.4, chloride      102, carbon dioxide 19, glucose 134, BUN 11, creatinine 0.84, total      bilirubin 0.8, alkaline phosphatase 61, AST 28, ALT 22, total      protein 7.7, albumin in blood 4.7, and calcium 10.3.   HOSPITAL COURSE   1. Abdominal pain, nausea, and diarrhea.  Initially, the differentials      were very broad and included small bowel obstruction (partial small      bowel obstruction), peptic ulcer disease, gastroesophageal reflux      disease, mesenteric ischemia, acute pancreatitis, and infectious      gastroenteritis.  Initially, the serum lipase is negative ruling      out the possibility of acute pancreatitis and abdominal x-ray      showed gaseous dilatation of small bowels and distention of the      stomach with free air fluid levels suggesting a diagnosis of      partial small bowel obstruction.  We put a nasogastric tube and      drained more than 2 L of fluid.  We treated him symptomatically      with IV fluids for pain control with morphine and antiemetics.  The      patient's nausea and diarrhea resolved the next day.  The patient's      abdominal pain got better.  Secondary to his slightly elevated      lactic acid levels, there was a question of mesenteric ischemia, so      we did the CT scan of the abdomen to rule out the possibility of      mesenteric ischemia.  CT scan of the abdomen also showed findings      suggestive of small bowel obstruction, but there were no signs      ileus or mesenteric ischemia.  We consulted the GI doctors      secondary to slightly elevated lactic acid levels  as well as the      fecal occult blood test being positive.  GI recommended colonoscopy      for the possible colonic polyps bleeding and giving rise to a fecal      occult blood being positive.  They also suggested upper GI  endoscopy.  Upper GI endoscopy showed the findings as mentioned      above and recommended using proton pump inhibitors.  Colonoscopy      was basically normal and advised repeat colonoscopy in 10 years.      The patient at the time of discharge does not have any nausea or      diarrhea, but still complains of mild 2/10 in severity of abdominal      pain.  As the upper GI endoscopy suggested erosions in the distal      part of the esophagus secondary to gastroesophageal reflux disease,      the patient needs to take proton pump inhibitors for a couple of      months.  The patient is to follow up with his primary care      physician Dr. Jeannetta Nap.   1. Lung nodules in the left lower lobe on CT scan.  Apparently, as we      did a CT scan of the abdomen that included the lower portion of the      chest also.  The CT scan of the abdomen showed a 6-mm lung nodule      in the left lower lobe.  We discussed with the Radiology regarding      the pulmonary nodules in the left lower lobe.  Basically      radiologist recommended, if the patient has a history of cancer,      dedicated CT imaging of the chest may be indicated to evaluate for      other nodules.  In the absence of a cancer history, follow up could      be performed as per the Fleischner criteria.  If the patient is at      high risk for bronchogenic carcinoma, followup chest CT at 6-12      months is recommended.  If the patient is at low risk for      bronchogenic carcinoma, followup chest CT at 12 months is      recommended.  As the patient is a smoker for a longtime for nearly      35 years, he is definitely at high risk for bronchogenic carcinoma.      So according to these guidelines he needs to follow up,  so he will      have a follow up chest CT scan at 6 months.  I talked with Dr.      Jeannetta Nap over the phone at [phone] and made him aware of the      pulmonary nodules in the left lower lobe.  We will forward the      carbon copy of the discharge summary to Dr. Jeannetta Nap and Mr. Poer     is to follow up with Dr. Jeannetta Nap in 6 months to the get repeat CT      scan of the chest.  We advised the patient extensively on smoke      cessation.   1. COPD/emphysema.  During the hospitalization, his COPD has been very      stable.  We gave him albuterol nebulizations q.6 h. p.r.n. and we      did not have any problems during the entire hospitalization.  2. Bipolar disorder/major depression.  We continued all his      psychiatric medications.  He needs to follow up with psychiatry as      needed for his psychiatric complaints.  3. Hyperlipidemia.  Secondary to his  GI complaints we stopped his all      statins during the hospitalization.  The patient is to restart all      his home medications at the time of discharge.  4. Hypertension.  Basically, we are not sure if the patient really has      a history of hypertension.  The patient described that he had one      episode of high blood pressure with systolics in the range of 180-      190s during an episode of drug overdose/withdrawal.  Apparently,      the patient is not taking any medications for his blood pressure.      The patient is to follow up with his primary care physician Dr.      Jeannetta Nap in the future and is to follow up with his blood pressure.  5. Thrombocytopenia.  Secondary to his history of DVT in the past,      which was provoked after arthroscopic surgery of the knee, we      started him on a prophylactic dose of heparin.  The patient      developed a thrombocytopenia on the first and second days after      starting heparin treatment.  We checked for heparin antibody      screen, which came back positive.  But the confirmatory test  for      the heparin antibody screen is pending.  As the heparin antibody      preliminary screening test gives a very high false positive      results, it is too early to label him as heparin allergic.  We will      inform Dr. Jeannetta Nap with the confirmatory heparin test results.  6. Herniated disk at L4-L5.  We did not have any problems during the      entire hospitalization.  We gave him his home pain medications and      the patient did not have any problems during the entire      hospitalization.  7. Tobacco abuse.  The patient mentioned that he smokes about half      pack per day over the last 35 years.  We counseled him extensively      during the hospitalization regarding smoking cessation.  We      explained him about the findings of the CT scan of the chest and      the possibility of it being a malignancy.  We explained to him the      need for a repeat chest CT scan in 6 months and the need for a      follow up with Dr. Jeannetta Nap.  During the hospitalization, we gave him      nicotine patches, but the patient mentioned that he is motivated      enough to quit smoking without any nicotine patches.  The patient      is to follow up with Dr. Jeannetta Nap if he needs any help with the      smoking cessation.   DISCHARGE LABS AND VITALS   Temperature 97, blood pressure 116/76, pulse 68, and respiratory 20.  CBC; white count 5.4, RBC 3.82, hemoglobin 12.2, hematocrit 34.9, MCV  91.6, MCHC 34.8, RDW 13.2, and platelet count 122.  Basic metabolic  panel; sodium 141, potassium 3.1, chloride 111, carbon dioxide 24,  glucose 92, BUN 3, creatinine 0.74, calcium 7.9.   On the day of discharge, the patient is  stable, alert and oriented, and  is not in any acute distress.  The patient is to follow up with his  primary care physician Dr. Jeannetta Nap on February 18, 2008, at 10:30 a.m.  His potassium has been repleted on the day of discharge.      Blondell Reveal, MD  Electronically Signed       Ileana Roup, M.D.  Electronically Signed    VB/MEDQ  D:  01/31/2008  T:  02/01/2008  Job:  [id number]   cc:   Windle Guard, M.D.

## 2010-07-09 NOTE — H&P (Signed)
[name], [name]:  [id number]   MEDICAL RECORD NO.:  [id number]          PATIENT TYPE:  INP   LOCATION:  1843                         FACILITY:  [facility]   PHYSICIAN:  Renato Battles, M.D.     DATE OF BIRTH:  [birth year]   DATE OF ADMISSION:  12/13/2004  DATE OF DISCHARGE:                                HISTORY & PHYSICAL   PRIMARY CARE PHYSICIAN:  Unassigned.   REASON FOR ADMISSION:  Overdose and suicide attempt.   HISTORY OF PRESENT ILLNESS:  The patient is a 55 year old white male who has  been feeling depressed recently.  Today he took 30 to 40 tablets of Klonopin  0.25 mg with intention to kill himself.  He also drank half a fifth of  vodka, however, he changed his mind and called a friend of his asking for  help.  His friend called EMS and he was transferred to [facility].  There was no loss of consciousness along the way.  He is saturating well.  He was normotensive on the scene and did not become unstable at any point.   REVIEW OF SYSTEMS:  CONSTITUTIONAL:  No fevers, chills, or night sweats.  Positive for less appetite and weight loss.  GASTROINTESTINAL:  Positive for  nausea.  Positive for constipation.  GENITOURINARY:  No dysuria, hematuria,  or retention.  CARDIOPULMONARY:  No chest pain, shortness of breath, no  orthopnea, no PND, and no cough.   PAST MEDICAL HISTORY:  1.  Hypercholesterolemia.  2.  Low back pain with history of sciatica.  3.  Depression and anxiety.  4.  Questionable history of COPD.  The patient does not have formal      diagnosis.  However, he uses albuterol inhaler at home.  5.  History of noncardiac chest pain.   PAST SURGICAL HISTORY:  1.  Right inguinal hernia repair.  2.  Left knee arthroscopy.  3.  Appendectomy.   FAMILY HISTORY:  Positive for CAD.   SOCIAL HISTORY:  He smokes 1-1/2 packs per day and has been doing so for 30  years.  Denies any regular alcohol intake.  He denies any drug use.   ALLERGIES:  COMPAZINE.   MEDICATIONS:  1.  Zocor 20 mg p.o. nightly.  2.  Nitroglycerin 0.4 mg sublingual p.r.n.  3.  Gabapentin 300 mg p.o. t.i.d.  4.  Risperdal 0.25 mg p.o. b.i.d. plus 1 mg p.o. nightly.  5.  Klonopin 0.5 mg p.o. t.i.d.  6.  Cymbalta unclear dose, 90 mg a day.   PHYSICAL EXAMINATION:  GENERAL:  The patient is obese and has flat affect,  alert and oriented x3.  In no acute distress.  VITAL SIGNS:  Temperature 97.7, heart rate 81, respiratory rate 22, blood  pressure 111/59, O2 saturation 100% on 3 liters of nasal cannula.  HEENT:  Head is normocephalic and atraumatic.  Pupils equal, round, and  reactive to light and accommodation.  Extraocular movements intact  bilaterally.  NECK:  Short and thick.  No thyromegaly.  No carotid bruit.  CHEST:  Positive for diffuse bilateral very mild  faint wheezing.  No rales  or rhonchi.  HEART:  Regular rate and rhythm with no murmurs.  ABDOMEN:  Obese, soft, nontender, and nondistended.  Hypoactive bowel  sounds.  EXTREMITIES:  No cyanosis, clubbing, or edema.  NEUROLOGY:  Except for being drowsy, grossly nonfocal.   STUDIES:  Currently all studies are pending.  I have the drug screen back  which was positive for Benzodiazepine.   IMPRESSION:  1.  Suicide attempt.  2.  Benzodiazepine overdose.  3.  Hypercholesterolemia.  4.  Back pain.  5.  Depression and anxiety.  6.  Questionable chronic obstructive pulmonary disease with significant      smoking history.   PLAN:  1.  Admit to telemetry with a sitter in the room at all times.  2.  Suicide precautions.  3.  Monitor for 24 to 48 hours.  4.  Consult psychiatry.  5.  Continue Zocor.  6.  Continue all medications except for Klonopin.  7.  Use Senokot for constipation.      Renato Battles, M.D.  Electronically Signed     SA/MEDQ  D:  12/13/2004  T:  12/13/2004  Job:  [id number]

## 2010-07-09 NOTE — Discharge Summary (Signed)
[name], [name]              ACCOUNT NO.:  [id number]   MEDICAL RECORD NO.:  [id number]          PATIENT TYPE:  INP   LOCATION:  3740                         FACILITY:  [facility]   PHYSICIAN:  [name], M.D. DATE OF BIRTH:  April 21, 1955   DATE OF ADMISSION:  05/18/2004  DATE OF DISCHARGE:  05/19/2004                                 DISCHARGE SUMMARY   ADMISSION DIAGNOSIS:  1.  New onset angina, rule out myocardial infarction.  2.  Tobacco abuse.  3.  Morbid obesity.  4.  Positive family history of coronary artery disease.   FINAL DIAGNOSIS:  1.  Unstable angina, negative Persantine Myoview, myocardial infarction      ruled out.  2.  Tobacco abuse.  3.  Morbid obesity.  4.  Positive family history of coronary artery disease.   DISCHARGE MEDICATIONS:  1.  Enteric coated aspirin 81 mg 1 tablet daily.  2.  Nexium 40 mg 1 capsule daily half hour before breakfast.  3.  Nitro 0.4 mg sublingual use as directed.   DISCHARGE INSTRUCTIONS:  Diet low salt, low cholesterol.  Activities as  tolerated.  The patient may return back to work on May 22, 2004.  The  patient has been advised to reduce weight.  Follow up with me in two weeks.   CONDITION ON DISCHARGE:  Stable.   BRIEF HISTORY:  [name] is a 55 year old white male with no significant  past medical history except for tobacco abuse, morbid obesity, and positive  family history of coronary artery disease, came to the ER complaining of  retrosternal chest pressure described as someone sitting on his chest, rated  8 over 10, associated with nausea and diaphoresis while at work.  The pain  lasted approximately 30 minutes, relieved with sublingual nitroglycerin by  EMS.  Denies any chest pain at present.  Denies palpitations, light  headedness, but felt dizzy, no syncopal episodes.  States he had similar  chest pain approximately ten plus years ago and had left cath and was told  he had mild coronary artery disease at  another hospital.  The patient  presently complains of severe headache secondary to IV nitrates, denies  relation of chest pain to food, breathing, or movement.   PAST MEDICAL HISTORY:  As above.   PAST SURGICAL HISTORY:  He had appendectomy at the age of 55, he had right  inguinal hernia repair at the age of 55, approximately nine years ago had  left knee surgery, many years ago had tonsillectomy.  He had left cath more  than ten years ago.   SOCIAL HISTORY:  He is divorced, has one child, smokes one pack per day for  30 plus years, no history of alcohol abuse or drug abuse.   FAMILY HISTORY:  Father died of  MI at the age of 55, he had coronary artery  disease and bypass in his 42s.  He died of congestive heart failure.  Mother  had MI at the age of 60.  His grandparents  have significant coronary artery  disease.   PHYSICAL EXAMINATION:  He is alert,  awake, oriented x 3, in no acute  distress.  Blood pressure 104/60, pulse 76 and regular.  Conjunctivae pink.  Neck supple.  No JVD.  Lungs clear to auscultation without rhonchi.  Cardiovascular exam:  S1 and S2 were soft,  there was no S3 gallop, there  was no murmur.  Abdomen soft, bowel sounds present, nontender, obese.  Extremities:  There is no cyanosis, clubbing, and edema.  Pedal pulses were  2+.   LABORATORY DATA:  EKG showed normal sinus rhythm with no acute ischemic  changes.  His lipid panel is still pending.  Two sets of cardiac enzymes and  troponin I were negative.  Sodium 134, potassium 4, chloride 106, bicarb 21,  glucose 85, BUN 9, creatinine 0.7.  Homocystine level 8.32 which was in  normal range.  Hemoglobin 14.9, hematocrit 42.3, white count 13.4, repeat  hemoglobin 13.8, hematocrit 40.2, white count 9.5.   HOSPITAL COURSE:  The patient was admitted to telemetry unit, MI was ruled  out by serial enzymes and EKG, discussed at length with the patient  regarding the options of treatment, i.e., noninvasive stress  test versus  left cath, possible PTCA and stenting, the patient opted for noninvasive  stress testing initially.  The patient underwent Persantine Myoview today  which showed no evidence of reversible ischemia with EF of 61%.  The patient  did not have any further episodes of chest pain during the hospital stay,  discussed at length regarding smoking cessation and weight control, the  patient agrees and states he will try to quit smoking on his own prior to  considering any medications.  The patient will be discharged home on the  above medications and will be followed up in my office in two weeks.  We  will check his lipid panel as an outpatient and if his cholesterol is  elevated, will start him on statins as an outpatient.      MNH/MEDQ  D:  05/19/2004  T:  05/19/2004  Job:  [id number]

## 2010-07-09 NOTE — Discharge Summary (Signed)
NAMEMELQUISEDEC, JOURNEY NO.:  [id number]   MEDICAL RECORD NO.:  [id number]          PATIENT TYPE:  IPS   LOCATION:  0305                          [facility]   PHYSICIAN:  Anselm Jungling, MD  DATE OF BIRTH:  Nov 30, 1955   DATE OF ADMISSION:  11/11/2004  DATE OF DISCHARGE:  11/18/2004                                 DISCHARGE SUMMARY   IDENTIFYING DATA AND REASON FOR ADMISSION:  This was the second [facility]  admission for [name], a 55 year old divorced male who was admitted again  because of depression, anxiety, and suicidal ideation.  He was referred  directly from our Intensive Outpatient psychiatric program, which he had  been attending since his previous discharge.  Please refer to the admission  note for further details pertaining to the symptoms, circumstances, and  history that led to his re hospitalization.  Please also refer to the  initial admission and discharge summaries from his first inpatient stay.   He was again given an Axis I diagnosis of major depressive disorder,  recurrent, without psychotic features.   He came to [place] on his regimen of Cymbalta, Neurontin, and Klonopin.  Non  psychotropic medications included Crestor, for hypertension, and Nexium for  GERD.   MEDICAL AND LABORATORY:  The patient was again physically assessed prior to  admission.  A CBC and chemistry panel were within normal limits with the  exception of slightly increased WBCs at 11,700, which was attributed to  stress reaction.   HOSPITAL COURSE:  The patient was readmitted to the adult inpatient service.  He was continued on his usual trial of Cymbalta 90 mg daily.  Neurontin was  increased to 300 mg t.i.d..  He was continued on Klonopin 0.75 mg t.i.d.   A trial of Inderal to address anxiety symptoms was unsatisfactory.  However,  Risperdal in doses of 0.25 mg b.i.d. and 1 mg at h.s. were very beneficial  in reducing anxiety, and stabilizing sleep.  He was also placed on  Ambien CR  12.5 mg at bedtime for sleep.   Adam focused well and worked hard on his presenting issues.  He indicated  that he wanted to re establish his connection with his higher power, and as  such made arrangements to have the minister of his former church come and  visit him.  This visit did occur and it was very helpful and uplifting for  Annetta South.  He discussed with his minister how he might get re involved with  his church community and their activities immediately following his  discharge.   At the time of his discharge, the patient was in much better spirits, more  optimistic and future oriented, and tolerating medications well.  His sleep  had stabilized.   AFTERCARE PLANS:  The patient was to follow up with the undersigned for  outpatient medication management, and he was to resume participation in the  Intensive Outpatient Program.   DISCHARGE MEDICATIONS:  Neurontin 300 mg t.i.d., Risperdal 0.25 mg b.i.d.  and 1 mg q.h.s., Ambien CR 12.5 mg q.h.s., Klonopin 0.75 mg t.i.d., and  Cymbalta  90 mg daily.   DISCHARGE DIAGNOSES:  AXIS I:  Major depressive disorder, recurrent, without  psychotic features.  AXIS II:  Deferred.  AXIS III:  Gastroesophageal reflux disease and hypertension.  AXIS IV:  Stressors, severe.  AXIS V:  Global assessment of function on discharge 60.   Non psychotropic medications included Protonix 80 mg daily and Zocor 40 mg  daily.           ______________________________  Anselm Jungling, MD  Electronically Signed     SPB/MEDQ  D:  12/14/2004  T:  12/15/2004  Job:  [id number]

## 2010-07-09 NOTE — Discharge Summary (Signed)
[name], Kenneth NO.:  [id number]   MEDICAL RECORD NO.:  [id number]          PATIENT TYPE:  IPS   LOCATION:  0301                          [facility]ACILITY[facility]  [facility]   PHYSICIAN:  Anselm Jungling, MD  DATE OF BIRTH:  17-May-1955   DATE OF ADMISSION:  10/29/2004  DATE OF DISCHARGE:  11/08/2004                                 DISCHARGE SUMMARY   IDENTIFYING DATA AND REASON FOR ADMISSION:  This was a readmission to [facility]  for this 55 year old divorced white male.  He had recently completed a 2-  week outpatient course of intensive treatment at our [facility].  Prior to that he had been hospitalized here on an inpatient basis  because of depression and suicidal ideation.  He returned to [facility] for this  readmission after developing uncontrollable crying and shaking, thinking  about overdosing on his medications, and informing his therapist of this.  Please refer to the admission note for further details pertaining to the  symptoms, circumstances and history that lead to his re-hospitalization.  He  was given an initial Axis I diagnosis of major depressive disorder,  recurrent without psychotic features.   MEDICAL AND LABORATORY:  The patient has a history of GERD and  hypercholesterolemia.  His medical Jasman Murri is Dr. Patrecia Pour in [facility].   ALLERGIES:  He is allergic to COMPAZINE.   ADMISSION LABORATORY:  Included a CBC which was within normal limits as well  as a routine chemistry panel and TSH level.  Toxicology screen was negative  for drugs.   HOSPITAL COURSE:  The patient was readmitted to the adult inpatient  psychiatric service.  He was a good participant in the treatment program.  He was very, very depressed but sincerely wanted to feel better.  He had  intermittent suicidal ideation during the initial portion of his inpatient  stay.   A trial of Neurontin 200 mg t.i.d. was initiated, and Klonopin 0.5 mg b.i.d.  was initiated to address  severe anxiety.  Both of these were helpful and  well tolerated.  At times he had problems with headache and nausea which  were treated symptomatically with Phenergan and Percocet.   On one occasion his headache was severe enough that he needed an  intramuscular injection of Demerol and Phenergan, which resolved his  headache.  Klonopin was later increased to 0.75 mg t.i.d.   The patient discussed wanting to rediscover his religious community and a  spiritual connection with his higher power.  To this end, we requested that  the hospital Chaplain come visit the patient, which was helpful.  Also, the  patient's own church minister came to visit him which was also helpful and  uplifting.   There was a family session that occurred during his inpatient stay involving  one male and one male friend.  They pledged their support to the patient,  and this was helpful to him as well.   At the time of discharge, the patient was still depressed, although he  denied any active thoughts or intent of self-harm.  He acknowledged some  significant but not complete improvement in mood.  Eating and sleeping were  stable.  At that point the patient was felt to be appropriate for discharge  to the intensive outpatient psychiatric treatment beginning the following  day.   AFTERCARE:  The patient was to follow up with the outpatient program on  November 09, 2004.   DISCHARGE MEDICATIONS:  1.  Cymbalta 90 mg daily.  2.  Neurontin 200 mg t.i.d.  3.  Klonopin 0.75 mg t.i.d.  4.  Protonix 80 mg daily.  5.  Zocor 40 mg daily.   DISCHARGE DIAGNOSES:  AXIS I:  Major depressive disorder, recurrent, severe  without psychotic features.  AXIS II:  Deferred.  AXIS III:  Gastroesophageal reflux disease.  Hypertension.  AXIS IV:  Stressors severe.  AXIS V:  Global assessment of function on discharge 55.           ______________________________  Anselm Jungling, MD  Electronically Signed      SPB/MEDQ  D:  12/01/2004  T:  12/01/2004  Job:  [id number]

## 2010-07-09 NOTE — Discharge Summary (Signed)
[name], [name]              ACCOUNT NO.:  [id number]   MEDICAL RECORD NO.:  [id number]          PATIENT TYPE:  INP   LOCATION:  2023                         FACILITY:  [facility]   PHYSICIAN:  Jonna L. Robb Matar, M.D.DATE OF BIRTH:  08-09-1955   DATE OF ADMISSION:  12/13/2004  DATE OF DISCHARGE:  12/15/2004                                 DISCHARGE SUMMARY   PRIMARY CARE PHYSICIAN:  Unassigned.   DIAGNOSES:  1.  Overdose of Klonopin.  2.  Depression.  3.  Anxiety.  4.  Noncardiac chest pain.  5.  History of low-back pain with sciatica.  6.  Hypercholesterolemia.   CODE STATUS:  Full.   PAST SURGICAL HISTORY:  Operations - none.   CONSULTATIONS:  Antonietta Breach, M.D. from psychiatry.   HISTORY:  This is a 55 year old white male who took 30-40 tablets of  Klonopin; states he was going to kill himself.  He drank a half fifth of  vodka.  He changed his mind and called a friend to ask for help.   PHYSICAL EXAMINATION:  The physical exam is unremarkable, other than the  patient being drowsy and overweight.   LABORATORY DATA:  Initial laboratory studies were unremarkable.   HOSPITAL COURSE:  The patient was admitted to telemetry, had a sitter and  was continued on his medications other than Klonopin.  Today the patient  states he just got confused about how much he was taking and was not  planning on committing suicide.  He was supposed to have had an appointment  with Dr. Electa Sniff at [facility], which he missed on December 14, 2004  and another appointment has been made for January 21, 2005.   The patient was seen by Dr. Jeanie Sewer who concurred that the patient was not  committable and the patient did not wish to voluntarily go to the [facility], but has in the past.   DISCHARGE MEDICATIONS:  The patient will be discharged on his previous  medications:  1.  Gabapentin 300 three times a day.  2.  Risperdal 1 mg at bedtime.  3.  Clonazepam 0.5 mg 1.5  three times a day.  4.  Cymbalta 30 mg 3 tablets daily.  5.  Zocor 40 mg at bedtime.  6.  Risperdal 0.2 twice a day.  7.  Protonix 40 mg at bedtime.   The patient will contact Dr. Barrett Shell office for any additional  medications.      Jonna L. Robb Matar, M.D.  Electronically Signed     JLB/MEDQ  D:  12/15/2004  T:  12/16/2004  Job:  [id number]

## 2010-07-09 NOTE — H&P (Signed)
[name], [name]:  [id number]   MEDICAL RECORD NO.:  [id number]          PATIENT TYPE:  IPS   LOCATION:  0503                          [facility]ILITY[facility]  [facility]   PHYSICIAN:  Anselm Jungling, MD  DATE OF BIRTH:  May 03, 1955   DATE OF ADMISSION:  01/17/2005  DATE OF DISCHARGE:                         PSYCHIATRIC ADMISSION ASSESSMENT   IDENTIFYING INFORMATION:  This is a 55 year old divorced white male. This is  a voluntary admission.   HISTORY OF PRESENT ILLNESS:  The patient was brought to he hospital by his  ex-wife after he expressed suicidal thoughts, feeling that he could go on no  longer and having thoughts of killing himself somehow with a knife, cutting  on himself, trying to figure out how to make it look like an accident.  Approximately 2 weeks prior to this, he had an episode where he attempted to  commit suicide by contriving to have the police shoot him.  He called the  police to his home, had a pistol sitting on the kitchen table and when the  police told him to step away from the gun, he reached for it.  They  restrained from shooting him, but the patient was at that time admitted to  [facility] and discharged about a week and a half ago.  He  endorses depressed mood, becoming worse for the past 3 to 4 weeks, which he  attributes to the upcoming holidays and extreme loneliness, having been  divorced for about a year and a half now.  Also has considerable financial  stress and faces eviction from his apartment because he is 2 months behind  in rent.  He endorses reclusiveness, lethargy, anhedonia, depressed mood and  tearfulness for the past 3 weeks, suicidal ideation for the past 3 weeks.  No homicidal thoughts, no auditory or visual hallucinations.   PAST PSYCHIATRIC HISTORY:  The patient is followed by Dr. Geralyn Flash in  the [facility] outpatient clinic.  This is his  third admission to [facility], with the last one  being September 21 to the 28, 2006.  Most recent past admission, however, is  to [facility], where he was discharged about a week and a half ago.  He has a history of major depression without psychosis, with onset a couple  of years ago triggered by the death of his eldest daughter during  childbirth.  This was followed by separation and divorce from his marriage  of 25 years.  He has a history of prior suicidal ideation and attempt.  At  [facility], at that discharge, he had both Depakote and Lamictal  added to his treatment regimen.   SOCIAL HISTORY:  This is a divorced white male, 55 years old.  No  significant substance abuse, although recently has been drinking for the  past 3 days, almost a fifth of liquor daily, but does not typically drink  alcohol and does not typically abuse substances.  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  No legal charges.  Currently living alone in his own  apartment with significant financial stress as noted above.  Family supports  are minimal, although his ex-wife is supportive of him and brought him to  the hospital.  He also has a church community that has provided him with  some financial support and has recently contributed $500 to paying for his  rent.   ALCOHOL AND DRUG HISTORY:  The patient has no significant alcohol or street  drug abuse, has been drinking for the past 3 days, about a fifth a day, no  history of DTs or withdrawal.  His alcohol level was negative in the  emergency room.  He has never been detoxed from alcohol in the past or any  other substances.  Smokes 1 pack per day of cigarettes.  The patient also  reports smoking marijuana with a friend of his, but this is not typical for  him.   PAST MEDICAL HISTORY:  The patient is followed at [facility].  Medical problems include  hypertension and gastroesophageal reflux  disease, obesity, and chronic low back pain.   MEDICATIONS:  Cymbalta 90 mg daily, Methocarbamol 750 mg p.o. t.i.d.,  Depakote 250 mg in the morning and 500 mg at h.s., Ambien CR 12.5 mg at  h.s., Neurontin 300 mg 1 t.i.d., Zocor 40 mg daily, Lamictal 25 mg b.i.d.,  Risperdal 0.5 mg tab 1/2 twice a day and 2 at bedtime, Klonopin 0.75 mg  t.i.d., Protonix 40 mg daily.   POSITIVE PHYSICAL FINDINGS:  Well-nourished, well-developed, obese white  male in no acute distress. Full physical examination was done in the  emergency room and is noted in the record.  No new findings today.  Generally healthy in appearance but disheveled, dirty, hygiene is poor,  unshaven.  VITAL SIGNS:  Temperature 99.6, pulse 98, respirations 20, blood  pressure 133/81.  He reports that he has been compliant with his  medications.  On admission to the unit, patient is 5 feet 11 inches tall,  300 pounds.  NEUROLOGIC:  Within normal limits and nonfocal.  Ambulating  normal with normal arm swing, no remarkable findings.   DRUG ALLERGIES:  The patient is allergic to COMPAZINE.   DIAGNOSTIC STUDIES:  WBC 10.4, hemoglobin 14.3, hematocrit 41.8, platelets  202,000.  Sodium 135, potassium 3.5, chloride 99, carbon dioxide 26, BUN 9,  creatinine 1.4, glucose 110.  Urine drug screen positive for benzodiazepines  and THC.  Valproic acid level is 40.3.  Alcohol level less than 5.   MENTAL STATUS EXAM:  A fully alert male, obese, fairly good eye contact,  disheveled, dirty, hygiene is poor.  Psychomotor slowing, affect flattening  is present.  Subjectively, complains of feeling quite slowed down, lethargic  and anhedonic.  Speech is decreased in amount, slowed in pace, normal in  tone.  Mood depressed, helpless, hopeless.  Thought process logical,  coherent, no racing thoughts, no flight of ideas, no guarding or paranoia, no signs of psychosis.  Clearly suicidal with thoughts of wanting  to  possibly cut himself.  He has clearly been weighing the relative merits of  various suicidal modes, primarily cutting or going back to having a gun.  Thought process is logical, no homicidal thoughts.  Cognitively he is intact  and oriented x3.  Insight is adequate.  Impulse control and judgment within  normal limits.  Intellect within normal limits.   ADMISSION DIAGNOSIS:  AXIS I:  Major depression, recurrent, severe; alcohol  abuse, rule out dependence.  AXIS II:  Deferred.  AXIS III:  Obesity, gastroesophageal reflux disease, hypertension.  AXIS IV:  Severe financial stress, facing eviction, and also loss of his  vehicle because he has not been able to keep up payments.  AXIS V:  Current 35, past year 14.   PLAN:  Plan is to voluntarily admit the patient with q.15 minute checks in  place to alleviate his suicidal thought.  We are going to check a routine  urinalysis, liver enzymes and a TSH on him and we initiated a Librium detox  protocol, but he is actually quite a bit sedated today and has never had a  history of requiring detox before, so we are going to switch him to Librium  25 mg q.6 hours p.r.n. for any withdrawal symptoms and we will check CIWAs  on him q.6 hours while awake.  We are going to continue the Lamictal 25 mg  b.i.d., ask case management to evaluate  his situation, see what we can arrange in terms of credit counseling to  stave off having him evicted, and evaluate how he does here on the Librium  and go from there.   ESTIMATED LENGTH OF STAY:  5 days.      Margaret A. Lorin Picket, N.P.      Anselm Jungling, MD  Electronically Signed    MAS/MEDQ  D:  01/17/2005  T:  01/17/2005  Job:  [id number]

## 2010-07-09 NOTE — H&P (Signed)
NAMELYTLE, MALBURG NO.:  [id number]   MEDICAL RECORD NO.:  [id number]          PATIENT TYPE:  IPS   LOCATION:  0306                          FACILITY:  [facility]   PHYSICIAN:  Geoffery Lyons, M.D.      DATE OF BIRTH:  Jun 23, 1955   DATE OF ADMISSION:  10/29/2004  DATE OF DISCHARGE:                         PSYCHIATRIC ADMISSION ASSESSMENT   HISTORY OF PRESENT ILLNESS:  This is a 55 year old divorced white male.  Apparently he had developed uncontrollable crying and shaking.  He was  thinking about overdosing on his medications when his therapist called.  After speaking with the therapist it was decided that he should probably be  admitted.  The patient recently completed a 2-week outpatient course here at  the [facility].  He was discharged on October 15, 2004 to  return to work on October 18, 2004.  He states that he ran out of medicines  approximately 2 days ago.  As he had not been paid yet, he did not have  money to fill his prescription, and it did not occur to him to call over  here to get more samples.  The patient states he feels very alone.  He has  been separated 2 years and divorced almost 1 year.  He feels done in at 49.  He has angry dreams.  The patient states that the first time that had any  psychiatric treatment was back in the 90s.  His younger daughter who is now  77 was in a car accident and became a quadriplegic.  Two years ago his  daughter, who was at the time 42, died about a week after giving birth to  his granddaughter.   SOCIAL HISTORY:  He went to the 12th grade.  He is employed as a Research officer, trade union; he works for [employer].  He has been married twice, divorced twice.  Currently he is having relationship issues with his ex.  She is controlling  his access to his 25 year old daughter, and it is difficult for him to  participate with the 67-year-old granddaughter.   FAMILY HISTORY:  Apparently his father was an alcoholic.  His mother had  depression.  He smokes 2 packs of cigarettes per day.  He denies any alcohol  or drugs.   PRIMARY CARE Deiondra Denley:  Medical Tamerra Merkley is Dr. Dewain Penning in [facility].   MEDICAL HISTORY:  He is known to have GERD and hypercholesterolemia.   DRUG ALLERGIES:  COMPAZINE, his tongue swells and his jaw pulls to the side.   POSITIVE PHYSICAL FINDINGS:  He is obese.  His vital signs are height 5 feet  11 inches, weight 288, temperature 98, blood pressure 156/80 to 134/81,  pulse 84 to 90, respirations 18.  The remainder of his physical examination  was unremarkable.   MENTAL STATUS EXAM:  He is alert and oriented x 3.  He is appropriately  groomed, dressed and nourished.  His speech is a normal rate.  His mood is  depressed and anxious.  His affect is congruent.  His thought processes are  clear, rational and goal-oriented.  He wants to get the anxiety under  control.  He states he does not quite have panic, but it is difficult for  him to leave the house at times.  Judgment and insight were intact.  Concentration and memory are intact.  He denies suicidal or homicidal  ideation of an active nature.  However, he states if a doctor told him he  had cancer and was going to die that would be okay.  Right now he is just so  alone he feels hopeless.  He denies auditory or visual hallucinations.   ADMISSION DIAGNOSES:  AXIS I:  Depressive disorder.  AXIS II:  Deferred.  AXIS III:  Gastroesophageal reflux disease.  Hypercholesterolemia.  AXIS IV:  Problems with primary support group.  AXIS V:  35.   PLAN:  Admit for safety and stabilization.  Optimize his medications.  We  will continue the Cymbalta 60 mg p.o. daily, the Ambien 10 mg at h.s., the  Klonopin 0.25 mg t.i.d. as needed, and Crestor 10 mg p.o. daily.  We will  add some Neurontin 300 mg p.o. q.3h. while awake to help with his anxiety.      Mickie Leonarda Salon, P.A.-C.      Geoffery Lyons, M.D.  Electronically Signed    MD/MEDQ   D:  10/30/2004  T:  10/30/2004  Job:  [id number]

## 2010-07-09 NOTE — Discharge Summary (Signed)
NAME:  [name], [name]:  [id number]   MEDICAL RECORD NO.:  [id number]          PATIENT TYPE:  IPS   LOCATION:  0503                          FACILITY:  [facility]   PHYSICIAN:  Anselm Jungling, MD  DATE OF BIRTH:  01-Feb-1956   DATE OF ADMISSION:  01/17/2005  DATE OF DISCHARGE:  01/24/2005                                 DISCHARGE SUMMARY   IDENTIFYING DATA AND REASON FOR ADMISSION:  This was the third [facility] admission  for [name] a 55 year old divorced white male who was admitted on a voluntary  basis due to a resurgence of severe depression and suicidal thoughts, with a  specific plan of cutting himself and trying to figure out of how to make it  look like an accident. Approximately 2 weeks prior to this admission he had  an episode where he attempted to commit suicide by contriving to have the  police  shoot him. Please refer to the admission note for further details  pertaining to the symptoms, circumstances and history that led to his  hospitalization. The patient was customarily followed by the undersigned in  the [facility] outpatient clinic. He was given an  initial Axis I diagnosis of major depressive disorder, recurrent, severe,  alcohol abuse, and rule out bipolar disorder.   MEDICAL AND LABORATORY:  [name] returned to [place] with his usual medical history  of obesity, GERD, and hypertension. He was continued on his usual non-  psychotropic regimen consisting of Protonix 40 mg daily, Zocor 40 mg daily,  and Robaxin 750 mg t.i.d.. There were no significant medical issues during  this brief inpatient psychiatric stay.   HOSPITAL COURSE:  The patient was admitted to the adult inpatient service.  He presented as an extremely depressed, sad, tearful gentleman who was very  much wanting help and very cooperative with the admission process and with  treatment expectations throughout. He was continued on Cymbalta 60 mg daily,  Klonopin 0.75 mg  t.i.d., Neurontin 300 mg t.i.d., Depakote 250 mg b.i.d. and  750 mg q.h.s., Risperdal 0.5 mg b.i.d. and 1 mg q.h.s., and Lamictal 25 mg  b.i.d.. In addition he received Ambien CR 12.5 mg q.h.s..   Over the course of his 7-day inpatient stay, he gradually improved with  respect to mood and stability. He was able to focus well on issues  pertaining to his sense of overwhelming loneliness, social isolation, and  sense of loss as surrounds his divorce. He was able to identify sources of  support for himself, in the form of friends, and family were still  supportive of him. In addition he identified individuals at his church,  including his minister. 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   At the time of discharge, the patient was also discussing the possibility of  assisting in basketball coaching, and he had an invitation to do so just  prior.   At the time  of discharge the patient stated that he felt considerably better  and ready to resume his life and outpatient care. He was absent any suicidal  ideation or thoughts of self-harm.   AFTERCARE:  The patient is to follow up with the undersigned on January 21, 2005 at 1 p.m..   DISCHARGE MEDICATIONS:  All, as described above.   DISCHARGE DIAGNOSES:  AXIS I: Major depressive disorder, recurrent without  psychotic features, rule out bipolar disorder, depressed.  AXIS II: Deferred.  AXIS III: History of obesity, hypertension, gastroesophageal reflux disease,  back pain. AXIS IV: Stressors severe.  AXIS V: Global assessment of function  on discharge 70.           ______________________________  Anselm Jungling, MD  Electronically Signed     SPB/MEDQ  D:  01/24/2005  T:  01/24/2005  Job:  [id number]

## 2010-07-09 NOTE — H&P (Signed)
[name], [name]:  [id number]   MEDICAL RECORD NO.:  [id number]          PATIENT TYPE:  IPS   LOCATION:  0305                          FACILITY:  [facility]   PHYSICIAN:  Jeanice Lim, M.D. DATE OF BIRTH:  1956-02-10   DATE OF ADMISSION:  11/11/2004  DATE OF DISCHARGE:                         PSYCHIATRIC ADMISSION ASSESSMENT   TIME OF EXAM:  9:05 a.m.   IDENTIFYING INFORMATION:  This is a 55 year old separated white male.  This  is a voluntary admission.   HISTORY OF PRESENT ILLNESS:  This patient was discharged from [facility] November 08, 2004 and went to the intensive outpatient  clinic.  There he reports he continued to have no contact from his family,  was feeling isolated, increasingly depressed and was having thoughts of  overdosing on his Klonopin.  He told a psychiatrist on November 11, 2004  that he was going to go home and overdose on the Klonopin pills he had.  He  had had the pills by his bedside and almost had taken them the night before.  He gives a history of increasing depression for the past 4 weeks triggered  by conflict with his ex-wife coinciding with the anniversary of the death of  his eldest daughter who died 1 year ago in childbirth.  He has been  separated from his ex-wife for about 1 year now.  His 67 year old  quadriplegic daughter also lives with his ex-wife.  They have been resistant  to contact, were not willing to joint him in any of the memorial activities  about his other daughter's death.  He s feeling isolated, had become  increasingly depressed and having suicidal thoughts of overdosing prior to  his admission on October 29, 2004.  During his stay here September 8 to  November 08, 2004 none of his family members came to participate in his  family session.  He went home again, continued to feel isolated and  suicidal, sleeping poorly at night.  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  He endorsed  anhedonia, depressed mood, hopelessness, tearfulness, denies homicidal  thought, auditory or visual hallucinations.   PAST PSYCHIATRIC HISTORY:  Patient has been followed by Dr. Neita Goodnight,  his psychotherapist, who he has only seen one time prior to his admission on  October 29, 2004.  This was his first episode of depression, and his second  admission to [facility] with the prior admission  being September 8 to November 08, 2004.  He is currently in our intensive  outpatient clinic with Dr. Carolanne Grumbling.  Prior to his admission on  October 29, 2004, the patient has no prior psychiatric history, no prior  outpatient or inpatient psychiatric treatment.  Patient denies a prior  history of panic or mood swings.   SOCIAL HISTORY:  Patient was born and raised in [place], [place].  Parents are deceased.  He was an only child so has no brothers or sisters.  Separated for the past year from his wife.  Eldest  daughter deceased 1 year  ago in childbirth.  Patient has a 56 year old quadriplegic daughter who  lives with the ex-wife.  Patient himself is living alone, socially isolated.  He had previously been affiliated with a church community, has not been in  contact with them for almost a year.  He has been on leave for the past 4  weeks from his job as an Geophysicist/field seismologist.  There are no current legal  problems.  No history of substance abuse.   FAMILY HISTORY:  Noncontributory.   MEDICAL HISTORY:  Patient is followed by Dr. Malachi Bonds in [place],  [place].   MEDICAL PROBLEMS:  Gastroesophageal reflux disease.  Tension headaches by  history.   PAST MEDICAL HISTORY:  Remarkable for herniated L5 disk.  History of panic  attacks.   MEDICATIONS:  1.  Cymbalta 90 mg daily.  2.  Neurontin 200 mg p.o. t.i.d.  3.  Klonopin 0.75 mg p.o. t.i.d.  4.  Crestor 10 mg daily.  5.  Nexium 40 mg  p.o. q.h.s.  Patient reports that he was compliant with these medications at home.  These  were his discharge medications given him on November 08, 2004.   DRUG ALLERGIES:  COMPAZINE which causes dystonia.   POSITIVE PHYSICAL FINDINGS:  VITAL SIGNS:  A well-nourished, well-developed,  obese male about 5 feet 11 inches tall, 299 pounds.  Vital signs on  admission, afebrile, pulse 99, respirations 20, blood pressure 150/85.  GENERAL:  Normocephalic and atraumatic.  EARS, EYES, NOSE AND THROAT:  PERRL.  Sclerae are nonicteric.  Extraocular  movements are normal.  NECK:  Supple, no thyromegaly.  CHEST:  Clear to auscultation.  BREAST:  Exam deferred.  CARDIOVASCULAR:  S1 and S2 heard, no clicks, murmurs or gallops.  ABDOMEN:  Rounded, soft and nontender.  GENITOURINARY:  Deferred.  EXTREMITIES:  No clubbing or cyanosis.  SKIN:  Clear, no signs of self-mutilation  NEUROLOGIC:  Cranial nerves II-XII are intact.  Extraocular movements are  normal.  Motor movements are slowed.  Gait is normal with normal arm swing.  Neuro is nonfocal.  He does have some  psychomotor slowing.   DIAGNOSTIC STUDIES:  WBC 11.7, hemoglobin 14.5, hematocrit 42.2, platelet  count 222,000.  Sodium 136, potassium 3.5, chloride 101, carbon dioxide 28,  BUN 14, creatinine 0.8, glucose 94.  SGOT 21, SGPT 28, alkaline phosphatase  50.  Total bilirubin 0.6.  We will not repeat his TSH at this time since it  was done on the previous admission and was within normal limits.   MENTAL STATUS EXAM:  Fully alert male with affect flattening, motor slowing,  cooperative.  Speech slowed, otherwise normal production and normal tone.  Mood depressed, helpless, hopeless.  Feeling isolated, very lonely.  He  feels he has nothing to live for since his family will not speak to him.  Thought process positive for suicidal ideation with a plan to overdose.  No evidence of psychosis, flight of ideas or paranoia, no homicidal thought.   Cognitively he is intact and oriented x 3.  Intelligence within normal  limits.  Impulse control and judgment within normal limits.   ADMISSION DIAGNOSES:  AXIS I:  Major depression, initial episode, severe.  AXIS II:  Deferred.  AXIS III:  Tension headaches by history.  Gastroesophageal reflux disease.  AXIS IV:  Severe social isolation.  AXIS V:  Current 38, past year 60.   PLAN:  Voluntarily admit the patient with every 15 minute checks  in place.  We are going to add Risperdal 0.5 mg p.o. q.h.s. to his medication regime.  We did some ego supportive psychotherapy today in terms of reviewing  strategies of how he might begin to approach his wife and maintain contact  with his daughter who he misses very much.  Meanwhile, we also discussed a  way to reconnect with his church community.  He has also been urged by our  local spiritual counselors here to consider reestablishing those supports.  For his tension headaches we are going to give him some methocarbamol 500 mg  q.6h. p.r.n. for his headache and ibuprofen 400 mg t.i.d. for the next 4  days, and he can have some attempt to knock this headache out, and then we  will also give him some Percocet 5/325 mg 1-2 tablets q.6h. p.r.n. pain.  Patient is in agreement with the plan.  We are going to continue his  Cymbalta as it is.  Estimated length of stay is 5 days.      Margaret A. Lorin Picket, N.P.      Jeanice Lim, M.D.  Electronically Signed    MAS/MEDQ  D:  11/12/2004  T:  11/12/2004  Job:  [id number]

## 2010-11-26 LAB — CBC
HCT: 40 % (ref 39.0–52.0)
Hemoglobin: 12.9 g/dL — ABNORMAL LOW (ref 13.0–17.0)
Hemoglobin: 13.4 g/dL (ref 13.0–17.0)
Hemoglobin: 15.2 g/dL (ref 13.0–17.0)
MCHC: 33.3 g/dL (ref 30.0–36.0)
MCHC: 33.5 g/dL (ref 30.0–36.0)
MCHC: 34.8 g/dL (ref 30.0–36.0)
MCV: 91.6 fL (ref 78.0–100.0)
Platelets: 122 10*3/uL — ABNORMAL LOW (ref 150–400)
Platelets: 142 10*3/uL — ABNORMAL LOW (ref 150–400)
Platelets: 188 10*3/uL (ref 150–400)
RBC: 4.1 MIL/uL — ABNORMAL LOW (ref 4.22–5.81)
RBC: 4.32 MIL/uL (ref 4.22–5.81)
RBC: 4.91 MIL/uL (ref 4.22–5.81)
RDW: 13.4 % (ref 11.5–15.5)
RDW: 13.4 % (ref 11.5–15.5)
RDW: 13.5 % (ref 11.5–15.5)
WBC: 10.6 10*3/uL — ABNORMAL HIGH (ref 4.0–10.5)
WBC: 7.3 10*3/uL (ref 4.0–10.5)

## 2010-11-26 LAB — ACETAMINOPHEN LEVEL: Acetaminophen (Tylenol), Serum: <10 ug/mL — ABNORMAL LOW (ref 10–30)

## 2010-11-26 LAB — BLOOD GAS, ARTERIAL
Acid-base deficit: 2.9 mmol/L — ABNORMAL HIGH (ref 0.0–2.0)
pCO2 arterial: 36.5 mmHg (ref 35.0–45.0)
pH, Arterial: 7.385 (ref 7.350–7.450)

## 2010-11-26 LAB — URINALYSIS, ROUTINE W REFLEX MICROSCOPIC
Glucose, UA: NEGATIVE mg/dL
Ketones, ur: NEGATIVE mg/dL
Protein, ur: NEGATIVE mg/dL

## 2010-11-26 LAB — COMPREHENSIVE METABOLIC PANEL
ALT: 25 U/L (ref 0–53)
AST: 28 U/L (ref 0–37)
Albumin: 4.7 g/dL (ref 3.5–5.2)
Alkaline Phosphatase: 54 U/L (ref 39–117)
Alkaline Phosphatase: 61 U/L (ref 39–117)
BUN: 11 mg/dL (ref 6–23)
CO2: 24 mEq/L (ref 19–32)
Chloride: 103 mEq/L (ref 96–112)
GFR calc non Af Amer: >60 mL/min (ref >60)
Glucose, Bld: 100 mg/dL — ABNORMAL HIGH (ref 70–99)
Potassium: 4.4 mEq/L (ref 3.5–5.1)
Potassium: 4.7 mEq/L (ref 3.5–5.1)
Sodium: 134 mEq/L — ABNORMAL LOW (ref 135–145)
Sodium: 137 mEq/L (ref 135–145)
Total Protein: 7.7 g/dL (ref 6.0–8.3)

## 2010-11-26 LAB — STOOL CULTURE

## 2010-11-26 LAB — DIFFERENTIAL
Basophils Relative: 0 % (ref 0–1)
Lymphs Abs: 1.8 10*3/uL (ref 0.7–4.0)
Monocytes Absolute: 0.6 10*3/uL (ref 0.1–1.0)
Monocytes Relative: 6 % (ref 3–12)
Neutro Abs: 9.1 10*3/uL — ABNORMAL HIGH (ref 1.7–7.7)

## 2010-11-26 LAB — BASIC METABOLIC PANEL
BUN: 3 mg/dL — ABNORMAL LOW (ref 6–23)
CO2: 23 mEq/L (ref 19–32)
CO2: 24 mEq/L (ref 19–32)
Chloride: 111 mEq/L (ref 96–112)
Creatinine, Ser: 0.74 mg/dL (ref 0.4–1.5)
GFR calc Af Amer: >60 mL/min (ref >60)
GFR calc non Af Amer: >60 mL/min (ref >60)
Glucose, Bld: 94 mg/dL (ref 70–99)
Potassium: 3.7 mEq/L (ref 3.5–5.1)
Sodium: 140 mEq/L (ref 135–145)

## 2010-11-26 LAB — CLOSTRIDIUM DIFFICILE EIA: C difficile Toxins A+B, EIA: NEGATIVE

## 2010-11-26 LAB — ETHANOL: Alcohol, Ethyl (B): <5 mg/dL (ref 0–10)

## 2010-11-26 LAB — HEPARIN INDUCED THROMBOCYTOPENIA PNL
Patient O.D.: 0.246
Serotonin Release: 6 % release (ref <20)

## 2010-11-26 LAB — T4, FREE: Free T4: 0.92 ng/dL (ref 0.89–1.80)

## 2010-11-26 LAB — FECAL LACTOFERRIN, QUANT

## 2010-11-26 LAB — TSH: TSH: 3.622 u[IU]/mL (ref 0.350–4.500)

## 2010-11-26 LAB — LACTATE DEHYDROGENASE: LDH: 140 U/L (ref 94–250)

## 2010-11-26 LAB — LIPID PANEL: Cholesterol: 167 mg/dL (ref 0–200)

## 2010-11-26 LAB — SALICYLATE LEVEL: Salicylate Lvl: <4 mg/dL (ref 2.8–20.0)

## 2010-11-26 LAB — APTT: aPTT: 30 seconds (ref 24–37)

## 2010-11-26 LAB — LACTIC ACID, PLASMA: Lactic Acid, Venous: 3.1 mmol/L — ABNORMAL HIGH (ref 0.5–2.2)

## 2010-11-26 LAB — CULTURE, BLOOD (ROUTINE X 2)

## 2011-05-12 ENCOUNTER — Emergency Department (HOSPITAL_BASED_OUTPATIENT_CLINIC_OR_DEPARTMENT_OTHER)
Admission: EM | Admit: 2011-05-12 | Discharge: 2011-05-12 | Disposition: A | Discharge status: Home / Self Care | Payer: Medicare Other | Attending: Emergency Medicine | Admitting: Emergency Medicine

## 2011-05-12 ENCOUNTER — Emergency Department (INDEPENDENT_AMBULATORY_CARE_PROVIDER_SITE_OTHER): Payer: Medicare Other

## 2011-05-12 ENCOUNTER — Encounter (HOSPITAL_BASED_OUTPATIENT_CLINIC_OR_DEPARTMENT_OTHER): Payer: Self-pay | Admitting: *Deleted

## 2011-05-12 DIAGNOSIS — H9209 Otalgia, unspecified ear: Secondary | ICD-10-CM

## 2011-05-12 DIAGNOSIS — J40 Bronchitis, not specified as acute or chronic: Secondary | ICD-10-CM | POA: Insufficient documentation

## 2011-05-12 DIAGNOSIS — J449 Chronic obstructive pulmonary disease, unspecified: Secondary | ICD-10-CM | POA: Insufficient documentation

## 2011-05-12 DIAGNOSIS — R05 Cough: Secondary | ICD-10-CM

## 2011-05-12 DIAGNOSIS — J4489 Other specified chronic obstructive pulmonary disease: Secondary | ICD-10-CM | POA: Insufficient documentation

## 2011-05-12 DIAGNOSIS — F17200 Nicotine dependence, unspecified, uncomplicated: Secondary | ICD-10-CM | POA: Insufficient documentation

## 2011-05-12 DIAGNOSIS — R0602 Shortness of breath: Secondary | ICD-10-CM | POA: Insufficient documentation

## 2011-05-12 DIAGNOSIS — F319 Bipolar disorder, unspecified: Secondary | ICD-10-CM | POA: Insufficient documentation

## 2011-05-12 DIAGNOSIS — R059 Cough, unspecified: Secondary | ICD-10-CM

## 2011-05-12 HISTORY — DX: Bipolar disorder, unspecified: F31.9

## 2011-05-12 HISTORY — DX: Acute myocardial infarction, unspecified: I21.9

## 2011-05-12 HISTORY — DX: Chronic obstructive pulmonary disease, unspecified: J44.9

## 2011-05-12 MED ORDER — ALBUTEROL SULFATE (5 MG/ML) 0.5% IN NEBU
5.0000 mg | INHALATION_SOLUTION | Freq: Once | RESPIRATORY_TRACT | Status: AC
  Administered 2011-05-12: 5 mg via RESPIRATORY_TRACT
  Filled 2011-05-12: qty 1

## 2011-05-12 MED ORDER — DOXYCYCLINE HYCLATE 100 MG PO CAPS: 100.0000 mg | ORAL_CAPSULE | Freq: Two times a day (BID) | ORAL | Status: AC

## 2011-05-12 MED ORDER — HYDROCODONE-ACETAMINOPHEN 5-325 MG PO TABS: 2.0000 | ORAL_TABLET | Freq: Four times a day (QID) | ORAL | Status: AC | PRN

## 2011-05-12 MED ORDER — HYDROCODONE-ACETAMINOPHEN 5-325 MG PO TABS
2.0000 | ORAL_TABLET | Freq: Once | ORAL | Status: AC
  Administered 2011-05-12: 2 via ORAL
  Filled 2011-05-12: qty 2

## 2011-05-12 NOTE — ED Notes (Signed)
Pt also c/o bilateral ear pain, denies fever

## 2011-05-12 NOTE — ED Notes (Signed)
Pt reports cough, sore throat and bilateral ear pain x 1 week

## 2011-05-12 NOTE — Discharge Instructions (Signed)
Bronchitis Bronchitis is a problem of the air tubes leading to your lungs. This problem makes it hard for air to get in and out of the lungs. You may cough a lot because your air tubes are narrow. Going without care can cause lasting (chronic) bronchitis. HOME CARE   Drink enough fluids to keep your pee (urine) clear or pale yellow.   Use a cool mist humidifier.   Quit smoking if you smoke. If you keep smoking, the bronchitis might not get better.   Only take medicine as told by your doctor.  GET HELP RIGHT AWAY IF:   Coughing keeps you awake.   You start to wheeze.   You become more sick or weak.   You have a hard time breathing or get short of breath.   You cough up blood.   Coughing lasts more than 2 weeks.   You have a fever.   Your baby is older than 3 months with a rectal temperature of 102 F (38.9 C) or higher.   Your baby is 52 months old or younger with a rectal temperature of 100.4 F (38 C) or higher.  MAKE SURE YOU:  Understand these instructions.   Will watch your condition.   Will get help right away if you are not doing well or get worse.  Document Released: 07/27/2007 Document Revised: 01/27/2011 Document Reviewed: 01/09/2009 [facility] Patient Information 2012 [place], [place].   Use your albuterol and Advair inhalers as directed. Return if needed more frequently than prescribed. Ask Dr.Beauford to help you to stop smoking. See him in the office if not feeling better by next week. Return if her condition worsens for any reason

## 2011-05-12 NOTE — ED Provider Notes (Signed)
History     CSN: [id number]  Arrival date & time 05/12/11  1746   First MD Initiated Contact with Patient 05/12/11 2005      Chief Complaint  Patient presents with  . Otalgia    (Consider location/radiation/quality/duration/timing/severity/associated sxs/prior treatment) HPI Complains of cough for 4 days and bilateral your pain worse with coughing also complains of sore throat worse with coughing for the past 4 days treated with Tylenol and Comtrex without relief. Pain is nonradiating not made better by anything. Has been treating his cough with albuterol with partial relief. Maximum temperature 101.4 degrees. No other associated symptoms Past Medical History  Diagnosis Date  . COPD (chronic obstructive pulmonary disease)   . Bipolar 1 disorder   . Heart attack   . Additional heart attack     Past Surgical History  Procedure Date  . Coronary angioplasty with stent placement   . Appendectomy   . Tonsillectomy   . Hernia repair   . Knee surgery     No family history on file.  History  Substance Use Topics  . Smoking status: Current Everyday Smoker  . Smokeless tobacco: Never Used  . Alcohol Use: No      Review of Systems  Constitutional: Positive for fever.  HENT: Positive for ear pain.        Sore throat  Respiratory: Positive for cough and shortness of breath.   Cardiovascular: Negative.   Gastrointestinal: Negative.   Musculoskeletal: Negative.   Skin: Negative.   Neurological: Negative.   Hematological: Negative.   Psychiatric/Behavioral: Negative.   All other systems reviewed and are negative.    Allergies  Prochlorperazine edisylate  Home Medications   Current Outpatient Rx  Name Route Sig Dispense Refill  . FLUTICASONE-SALMETEROL 45-21 MCG/ACT IN AERO Inhalation Inhale 2 puffs into the lungs 2 (two) times daily.    Marland Kitchen LEVOTHYROXINE SODIUM 25 MCG PO TABS Oral Take 25 mcg by mouth daily.    . QUETIAPINE FUMARATE 300 MG PO TABS Oral Take 900 mg  by mouth at bedtime.    Marland Kitchen SIMVASTATIN 20 MG PO TABS Oral Take 20 mg by mouth every evening.    . TRAZODONE HCL 100 MG PO TABS Oral Take 200 mg by mouth at bedtime.    . VENLAFAXINE HCL 100 MG PO TABS Oral Take 300 mg by mouth daily.      BP 142/84  Pulse 90  Temp(Src) 99.1 F (37.3 C) (Oral)  Resp 20  SpO2 97%  Physical Exam  Nursing note and vitals reviewed. Constitutional:       Chronically ill-appearing  HENT:  Head: Normocephalic and atraumatic.       Bilateral tympanic membranes normal  Eyes: Conjunctivae are normal. Pupils are equal, round, and reactive to light.  Neck: Neck supple. No tracheal deviation present. No thyromegaly present.  Cardiovascular: Normal rate and regular rhythm.   No murmur heard. Pulmonary/Chest: Effort normal. He has wheezes.       Coughing  Abdominal: Soft. Bowel sounds are normal. He exhibits no distension. There is no tenderness.  Musculoskeletal: Normal range of motion. He exhibits no edema and no tenderness.  Neurological: He is alert. Coordination normal.  Skin: Skin is warm and dry. No rash noted.  Psychiatric: He has a normal mood and affect.    ED Course  Procedures (including critical care time)  Labs Reviewed - No data to display No results found.   No diagnosis found. 9:35 PM feels improved after treatment with  hydrocodone-A. Pap   MDM  Patient reports he is breathing normally and wheezes at baseline In light of patient's frail condition and history of fever Will treat with antibiotic Plan prescription doxycycline, Norco Followup with Dr. Gerome Apley if not improved by next week Patient encouraged stop smoking Diagnosis #1 bronchitis #2 otalgia #3 tobacco abuse       Doug Sou, MD 05/12/11 2138

## 2011-06-11 ENCOUNTER — Emergency Department (HOSPITAL_BASED_OUTPATIENT_CLINIC_OR_DEPARTMENT_OTHER)
Admission: EM | Admit: 2011-06-11 | Discharge: 2011-06-11 | Disposition: A | Discharge status: Other Acute Inpatient Hospital | Payer: Medicare HMO | Attending: Emergency Medicine | Admitting: Emergency Medicine

## 2011-06-11 ENCOUNTER — Emergency Department (INDEPENDENT_AMBULATORY_CARE_PROVIDER_SITE_OTHER): Payer: Medicare HMO

## 2011-06-11 ENCOUNTER — Encounter (HOSPITAL_BASED_OUTPATIENT_CLINIC_OR_DEPARTMENT_OTHER): Payer: Self-pay | Admitting: *Deleted

## 2011-06-11 DIAGNOSIS — Z79899 Other long term (current) drug therapy: Secondary | ICD-10-CM | POA: Insufficient documentation

## 2011-06-11 DIAGNOSIS — Z7982 Long term (current) use of aspirin: Secondary | ICD-10-CM | POA: Insufficient documentation

## 2011-06-11 DIAGNOSIS — J449 Chronic obstructive pulmonary disease, unspecified: Secondary | ICD-10-CM | POA: Insufficient documentation

## 2011-06-11 DIAGNOSIS — R1011 Right upper quadrant pain: Secondary | ICD-10-CM | POA: Insufficient documentation

## 2011-06-11 DIAGNOSIS — J4489 Other specified chronic obstructive pulmonary disease: Secondary | ICD-10-CM | POA: Insufficient documentation

## 2011-06-11 DIAGNOSIS — I252 Old myocardial infarction: Secondary | ICD-10-CM | POA: Insufficient documentation

## 2011-06-11 DIAGNOSIS — F319 Bipolar disorder, unspecified: Secondary | ICD-10-CM | POA: Insufficient documentation

## 2011-06-11 DIAGNOSIS — K7689 Other specified diseases of liver: Secondary | ICD-10-CM

## 2011-06-11 LAB — CBC
MCH: 31.7 pg (ref 26.0–34.0)
MCV: 89.8 fL (ref 78.0–100.0)
Platelets: 162 10*3/uL (ref 150–400)
RBC: 4.82 MIL/uL (ref 4.22–5.81)
RDW: 14.1 % (ref 11.5–15.5)

## 2011-06-11 LAB — COMPREHENSIVE METABOLIC PANEL
AST: 16 U/L (ref 0–37)
Alkaline Phosphatase: 99 U/L (ref 39–117)
BUN: 6 mg/dL (ref 6–23)
CO2: 25 mEq/L (ref 19–32)
Chloride: 100 mEq/L (ref 96–112)
Creatinine, Ser: 0.7 mg/dL (ref 0.50–1.35)
GFR calc non Af Amer: >90 mL/min (ref >90)
Potassium: 3.8 mEq/L (ref 3.5–5.1)
Total Bilirubin: 0.3 mg/dL (ref 0.3–1.2)

## 2011-06-11 LAB — DIFFERENTIAL
Basophils Absolute: 0 10*3/uL (ref 0.0–0.1)
Basophils Relative: 0 % (ref 0–1)
Eosinophils Absolute: 0.3 10*3/uL (ref 0.0–0.7)
Eosinophils Relative: 3 % (ref 0–5)
Lymphs Abs: 2.7 10*3/uL (ref 0.7–4.0)
Neutrophils Relative %: 66 % (ref 43–77)

## 2011-06-11 LAB — LIPASE, BLOOD: Lipase: 18 U/L (ref 11–59)

## 2011-06-11 LAB — URINALYSIS, ROUTINE W REFLEX MICROSCOPIC
Bilirubin Urine: NEGATIVE
Glucose, UA: NEGATIVE mg/dL
Hgb urine dipstick: NEGATIVE
Protein, ur: NEGATIVE mg/dL
Urobilinogen, UA: 0.2 mg/dL (ref 0.0–1.0)

## 2011-06-11 MED ORDER — HYDROMORPHONE HCL PF 1 MG/ML IJ SOLN
1.0000 mg | Freq: Once | INTRAMUSCULAR | Status: AC
  Administered 2011-06-11: 1 mg via INTRAVENOUS

## 2011-06-11 MED ORDER — SODIUM CHLORIDE 0.9 % IV BOLUS (SEPSIS)
1000.0000 mL | Freq: Once | INTRAVENOUS | Status: AC
  Administered 2011-06-11: 1000 mL via INTRAVENOUS

## 2011-06-11 MED ORDER — HYDROMORPHONE HCL PF 1 MG/ML IJ SOLN
1.0000 mg | Freq: Once | INTRAMUSCULAR | Status: AC
  Administered 2011-06-11: 1 mg via INTRAVENOUS
  Filled 2011-06-11: qty 1

## 2011-06-11 MED ORDER — IOHEXOL 300 MG/ML  SOLN
20.0000 mL | INTRAMUSCULAR | Status: AC
  Administered 2011-06-11 (×2): 20 mL via ORAL

## 2011-06-11 MED ORDER — SODIUM CHLORIDE 0.9 % IV SOLN
INTRAVENOUS | Status: DC
  Administered 2011-06-11: 16:00:00 via INTRAVENOUS

## 2011-06-11 MED ORDER — HYDROMORPHONE HCL PF 1 MG/ML IJ SOLN
INTRAMUSCULAR | Status: AC
  Filled 2011-06-11: qty 1

## 2011-06-11 MED ORDER — HYDROMORPHONE HCL PF 1 MG/ML IJ SOLN
INTRAMUSCULAR | Status: AC
Start: 2011-06-11 — End: 2011-06-11
  Administered 2011-06-11: 1 mg via INTRAVENOUS
  Filled 2011-06-11: qty 1

## 2011-06-11 MED ORDER — IOHEXOL 300 MG/ML  SOLN
100.0000 mL | Freq: Once | INTRAMUSCULAR | Status: AC | PRN
  Administered 2011-06-11: 100 mL via INTRAVENOUS

## 2011-06-11 NOTE — ED Notes (Signed)
Pt states that he has attempted to provide urine specimen, remains unable to do so.

## 2011-06-11 NOTE — ED Notes (Signed)
Patient transported to CT and returned. Attempting to obtain urine sample at this time.

## 2011-06-11 NOTE — ED Notes (Signed)
Pt states he has had right side abd pain for 2-3 days. "Lots of gas". Denies other s/s.

## 2011-06-11 NOTE — ED Notes (Signed)
All LDA's (lines, drains, airways, and wounds) were removed from documentation for discharge purposes.  At time of discharge all LDA's remained intact as were previously documented.    

## 2011-06-11 NOTE — ED Notes (Signed)
Pt states that he cannot provide a urine specimen at this time.  Will check back shortly.    

## 2011-06-11 NOTE — ED Provider Notes (Signed)
CT done, ?liver disease, but no other acute abnormality.  [facility] for u/s.  Wants pt sent to ED for u/s.  Dr. Leata Mouse has notified ED  Rolan Bucco, MD 06/11/11 2002

## 2011-06-11 NOTE — ED Provider Notes (Signed)
History     CSN: [id number]  Arrival date & time 06/11/11  1355   First MD Initiated Contact with Patient 06/11/11 1420      Chief Complaint  Patient presents with  . Abdominal Pain    (Consider location/radiation/quality/duration/timing/severity/associated sxs/prior treatment) HPI This 56 year old male presents with right upper quadrant abdominal pain. He states 2 days ago he started developing intermittent right upper quadrant abdominal pain lasting a few hours at a time off and on from 2 days ago until yesterday afternoon. Starting yesterday evening he developed constant right upper quadrant abdominal pain present all night long as well as all day today until this afternoon. He has no chest pain no shortness breath no cough no fever no nausea no vomiting no diarrhea no dysuria. He has no radiation of his pain. There is no treatment prior to arrival. His pain is moderately severe without radiation. His pain is worse with palpation and position changes. It has no associated symptoms. Past Medical History  Diagnosis Date  . COPD (chronic obstructive pulmonary disease)   . Bipolar 1 disorder   . Heart attack   . Additional heart attack     Past Surgical History  Procedure Date  . Coronary angioplasty with stent placement   . Appendectomy   . Tonsillectomy   . Hernia repair   . Knee surgery     History reviewed. No pertinent family history.  History  Substance Use Topics  . Smoking status: Current Everyday Smoker  . Smokeless tobacco: Never Used  . Alcohol Use: No      Review of Systems  Constitutional: Negative for fever.       10 Systems reviewed and are negative for acute change except as noted in the HPI.  HENT: Negative for congestion.   Eyes: Negative for discharge and redness.  Respiratory: Negative for cough and shortness of breath.   Cardiovascular: Negative for chest pain.  Gastrointestinal: Positive for abdominal pain. Negative for nausea, vomiting,  diarrhea and blood in stool.  Genitourinary: Negative for dysuria.  Musculoskeletal: Negative for back pain.  Skin: Negative for rash.  Neurological: Negative for syncope, numbness and headaches.  Psychiatric/Behavioral:       No behavior change.    Allergies  Rocephin and Prochlorperazine edisylate  Home Medications   Current Outpatient Rx  Name Route Sig Dispense Refill  . ACETAMINOPHEN 500 MG PO CHEW Oral Chew 1,000 mg by mouth every 6 (six) hours as needed. For pain    . ASPIRIN 325 MG PO TABS Oral Take 325 mg by mouth daily.    Marland Kitchen FLUTICASONE-SALMETEROL 45-21 MCG/ACT IN AERO Inhalation Inhale 1 puff into the lungs 2 (two) times daily.     Marland Kitchen LEVOTHYROXINE SODIUM 25 MCG PO TABS Oral Take 25 mcg by mouth daily.    . QUETIAPINE FUMARATE 400 MG PO TABS Oral Take 800 mg by mouth at bedtime.    Marland Kitchen SIMVASTATIN 20 MG PO TABS Oral Take 20 mg by mouth every evening.    . TRAZODONE HCL 100 MG PO TABS Oral Take 200 mg by mouth at bedtime.    . VENLAFAXINE HCL 100 MG PO TABS Oral Take 300 mg by mouth daily.      BP 137/83  Pulse 83  Temp(Src) 98.4 F (36.9 C) (Oral)  Resp 24  Ht 6' (1.829 m)  Wt 280 lb (127.007 kg)  BMI 37.97 kg/m2  SpO2 98%  Physical Exam  Nursing note and vitals reviewed. Constitutional:  Awake, alert, nontoxic appearance.  HENT:  Head: Atraumatic.  Eyes: Right eye exhibits no discharge. Left eye exhibits no discharge.  Neck: Neck supple.  Cardiovascular: Normal rate and regular rhythm.   No murmur heard. Pulmonary/Chest: Effort normal and breath sounds normal. No respiratory distress. He has no wheezes. He has no rales. He exhibits no tenderness.  Abdominal: Soft. Bowel sounds are normal. He exhibits no mass. There is tenderness. There is rebound and guarding.       Moderately tender right upper quadrant with localized rebound without generalized peritonitis; abdomen is morbidly obese  Musculoskeletal: He exhibits no tenderness.       Baseline ROM, no  obvious new focal weakness.  Neurological: He is alert.       Mental status and motor strength appears baseline for patient and situation.  Skin: No rash noted.  Psychiatric: He has a normal mood and affect.    ED Course  Procedures (including critical care time) ECG: Sinus rhythm, ventricular rate 85, normal axis, normal intervals, septal Q waves, no acute ST segment changes noted, no comparison ECG available.  Pain not significantly improving, abd exam unchanged, Pt requests [facility] to consider [place], hospitalist paged at [facility] for transfer request.[phone]  D/w [facility] hospitalist who requests [facility] obtain [facility] in ED since [facility] does not have [place] available either and Pt does not want to go to [facility] or [facility] for [place]. CT ordered. 1715  Labs Reviewed  COMPREHENSIVE METABOLIC PANEL - Abnormal; Notable for the following:    Glucose, Bld 111 (*)    All other components within normal limits  CBC - Abnormal; Notable for the following:    WBC 11.5 (*)    All other components within normal limits  TROPONIN I  LIPASE, BLOOD  DIFFERENTIAL  URINALYSIS, ROUTINE W REFLEX MICROSCOPIC   No results found.   No diagnosis found.    MDM  ED Dispo pnd anticipate transfer to [facility] after CT. Care endorsed to Dr. Fredderick Phenix in ED. 2130        Hurman Horn, MD 06/11/11 [phone]

## 2014-09-24 ENCOUNTER — Emergency Department (HOSPITAL_BASED_OUTPATIENT_CLINIC_OR_DEPARTMENT_OTHER): Payer: Medicare HMO

## 2014-09-24 ENCOUNTER — Encounter (HOSPITAL_BASED_OUTPATIENT_CLINIC_OR_DEPARTMENT_OTHER): Payer: Self-pay | Admitting: Emergency Medicine

## 2014-09-24 ENCOUNTER — Inpatient Hospital Stay (HOSPITAL_BASED_OUTPATIENT_CLINIC_OR_DEPARTMENT_OTHER)
Admission: EM | Admit: 2014-09-24 | Discharge: 2014-09-25 | DRG: 192 | Disposition: A | Discharge status: Home Health | Payer: Medicare HMO | Attending: Internal Medicine | Admitting: Internal Medicine

## 2014-09-24 DIAGNOSIS — R072 Precordial pain: Secondary | ICD-10-CM | POA: Diagnosis not present

## 2014-09-24 DIAGNOSIS — Z79891 Long term (current) use of opiate analgesic: Secondary | ICD-10-CM

## 2014-09-24 DIAGNOSIS — Z9049 Acquired absence of other specified parts of digestive tract: Secondary | ICD-10-CM

## 2014-09-24 DIAGNOSIS — Z6834 Body mass index (BMI) 34.0-34.9, adult: Secondary | ICD-10-CM

## 2014-09-24 DIAGNOSIS — R0789 Other chest pain: Secondary | ICD-10-CM | POA: Diagnosis present

## 2014-09-24 DIAGNOSIS — E669 Obesity, unspecified: Secondary | ICD-10-CM | POA: Diagnosis present

## 2014-09-24 DIAGNOSIS — R1011 Right upper quadrant pain: Secondary | ICD-10-CM

## 2014-09-24 DIAGNOSIS — G8929 Other chronic pain: Secondary | ICD-10-CM | POA: Diagnosis present

## 2014-09-24 DIAGNOSIS — Z885 Allergy status to narcotic agent status: Secondary | ICD-10-CM

## 2014-09-24 DIAGNOSIS — Z888 Allergy status to other drugs, medicaments and biological substances status: Secondary | ICD-10-CM

## 2014-09-24 DIAGNOSIS — Z720 Tobacco use: Secondary | ICD-10-CM | POA: Diagnosis not present

## 2014-09-24 DIAGNOSIS — J45909 Unspecified asthma, uncomplicated: Secondary | ICD-10-CM | POA: Diagnosis present

## 2014-09-24 DIAGNOSIS — M549 Dorsalgia, unspecified: Secondary | ICD-10-CM | POA: Diagnosis present

## 2014-09-24 DIAGNOSIS — Z955 Presence of coronary angioplasty implant and graft: Secondary | ICD-10-CM

## 2014-09-24 DIAGNOSIS — J441 Chronic obstructive pulmonary disease with (acute) exacerbation: Secondary | ICD-10-CM | POA: Diagnosis present

## 2014-09-24 DIAGNOSIS — Z7982 Long term (current) use of aspirin: Secondary | ICD-10-CM

## 2014-09-24 DIAGNOSIS — I25119 Atherosclerotic heart disease of native coronary artery with unspecified angina pectoris: Secondary | ICD-10-CM

## 2014-09-24 DIAGNOSIS — E039 Hypothyroidism, unspecified: Secondary | ICD-10-CM | POA: Diagnosis present

## 2014-09-24 DIAGNOSIS — K760 Fatty (change of) liver, not elsewhere classified: Secondary | ICD-10-CM | POA: Diagnosis present

## 2014-09-24 DIAGNOSIS — Z881 Allergy status to other antibiotic agents status: Secondary | ICD-10-CM

## 2014-09-24 DIAGNOSIS — I252 Old myocardial infarction: Secondary | ICD-10-CM

## 2014-09-24 DIAGNOSIS — F319 Bipolar disorder, unspecified: Secondary | ICD-10-CM | POA: Diagnosis present

## 2014-09-24 DIAGNOSIS — R109 Unspecified abdominal pain: Secondary | ICD-10-CM | POA: Diagnosis present

## 2014-09-24 DIAGNOSIS — I2510 Atherosclerotic heart disease of native coronary artery without angina pectoris: Secondary | ICD-10-CM | POA: Diagnosis present

## 2014-09-24 DIAGNOSIS — R079 Chest pain, unspecified: Secondary | ICD-10-CM | POA: Diagnosis present

## 2014-09-24 DIAGNOSIS — I10 Essential (primary) hypertension: Secondary | ICD-10-CM | POA: Diagnosis present

## 2014-09-24 DIAGNOSIS — Z79899 Other long term (current) drug therapy: Secondary | ICD-10-CM

## 2014-09-24 HISTORY — DX: Essential (primary) hypertension: I10

## 2014-09-24 HISTORY — DX: Unspecified asthma, uncomplicated: J45.909

## 2014-09-24 HISTORY — DX: Hypothyroidism, unspecified: E03.9

## 2014-09-24 LAB — COMPREHENSIVE METABOLIC PANEL
ALBUMIN: 3.5 g/dL (ref 3.5–5.0)
ALBUMIN: 3.1 g/dL — AB (ref 3.5–5.0)
ALT: 17 U/L (ref 17–63)
ALT: 17 U/L (ref 17–63)
AST: 21 U/L (ref 15–41)
AST: 18 U/L (ref 15–41)
Alkaline Phosphatase: 80 U/L (ref 38–126)
Alkaline Phosphatase: 73 U/L (ref 38–126)
Anion gap: 12 (ref 5–15)
Anion gap: 10 (ref 5–15)
BILIRUBIN TOTAL: 0.4 mg/dL (ref 0.3–1.2)
BUN: 5 mg/dL — AB (ref 6–20)
BUN: 6 mg/dL (ref 6–20)
CHLORIDE: 101 mmol/L (ref 101–111)
CHLORIDE: 103 mmol/L (ref 101–111)
CO2: 26 mmol/L (ref 22–32)
CO2: 25 mmol/L (ref 22–32)
CREATININE: 0.83 mg/dL (ref 0.61–1.24)
Calcium: 8.8 mg/dL — ABNORMAL LOW (ref 8.9–10.3)
Calcium: 8.3 mg/dL — ABNORMAL LOW (ref 8.9–10.3)
Creatinine, Ser: 0.86 mg/dL (ref 0.61–1.24)
GFR calc Af Amer: >60 mL/min (ref >60)
GFR calc Af Amer: >60 mL/min (ref >60)
GFR calc non Af Amer: >60 mL/min (ref >60)
GLUCOSE: 118 mg/dL — AB (ref 65–99)
Glucose, Bld: 112 mg/dL — ABNORMAL HIGH (ref 65–99)
POTASSIUM: 3.2 mmol/L — AB (ref 3.5–5.1)
POTASSIUM: 2.9 mmol/L — AB (ref 3.5–5.1)
Sodium: 139 mmol/L (ref 135–145)
Sodium: 138 mmol/L (ref 135–145)
Total Bilirubin: 0.4 mg/dL (ref 0.3–1.2)
Total Protein: 7 g/dL (ref 6.5–8.1)
Total Protein: 6 g/dL — ABNORMAL LOW (ref 6.5–8.1)

## 2014-09-24 LAB — RAPID URINE DRUG SCREEN, HOSP PERFORMED
Amphetamines: NOT DETECTED
BARBITURATES: NOT DETECTED
BENZODIAZEPINES: POSITIVE — AB
Cocaine: NOT DETECTED
Opiates: POSITIVE — AB
TETRAHYDROCANNABINOL: NOT DETECTED

## 2014-09-24 LAB — CBC WITH DIFFERENTIAL/PLATELET
BASOS ABS: 0 10*3/uL (ref 0.0–0.1)
BASOS PCT: 0 % (ref 0–1)
Basophils Absolute: 0 10*3/uL (ref 0.0–0.1)
Basophils Relative: 0 % (ref 0–1)
EOS PCT: 2 % (ref 0–5)
Eosinophils Absolute: 0.1 10*3/uL (ref 0.0–0.7)
Eosinophils Absolute: 0.2 10*3/uL (ref 0.0–0.7)
Eosinophils Relative: 2 % (ref 0–5)
HCT: 35.7 % — ABNORMAL LOW (ref 39.0–52.0)
HEMATOCRIT: 33.6 % — AB (ref 39.0–52.0)
Hemoglobin: 12.3 g/dL — ABNORMAL LOW (ref 13.0–17.0)
Hemoglobin: 11.7 g/dL — ABNORMAL LOW (ref 13.0–17.0)
LYMPHS ABS: 2.5 10*3/uL (ref 0.7–4.0)
LYMPHS PCT: 34 % (ref 12–46)
LYMPHS PCT: 40 % (ref 12–46)
Lymphs Abs: 3.4 10*3/uL (ref 0.7–4.0)
MCH: 28.6 pg (ref 26.0–34.0)
MCH: 28.8 pg (ref 26.0–34.0)
MCHC: 34.5 g/dL (ref 30.0–36.0)
MCHC: 34.8 g/dL (ref 30.0–36.0)
MCV: 83 fL (ref 78.0–100.0)
MCV: 82.8 fL (ref 78.0–100.0)
MONOS PCT: 6 % (ref 3–12)
Monocytes Absolute: 0.6 10*3/uL (ref 0.1–1.0)
Monocytes Absolute: 0.5 10*3/uL (ref 0.1–1.0)
Monocytes Relative: 7 % (ref 3–12)
Neutro Abs: 4.2 10*3/uL (ref 1.7–7.7)
Neutro Abs: 4.3 10*3/uL (ref 1.7–7.7)
Neutrophils Relative %: 57 % (ref 43–77)
Neutrophils Relative %: 52 % (ref 43–77)
PLATELETS: 211 10*3/uL (ref 150–400)
PLATELETS: 171 10*3/uL (ref 150–400)
RBC: 4.3 MIL/uL (ref 4.22–5.81)
RBC: 4.06 MIL/uL — ABNORMAL LOW (ref 4.22–5.81)
RDW: 15 % (ref 11.5–15.5)
RDW: 15 % (ref 11.5–15.5)
WBC: 7.4 10*3/uL (ref 4.0–10.5)
WBC: 8.4 10*3/uL (ref 4.0–10.5)

## 2014-09-24 LAB — TROPONIN I
Troponin I: <0.03 ng/mL (ref <0.031)
Troponin I: <0.03 ng/mL (ref <0.031)
Troponin I: <0.03 ng/mL (ref <0.031)

## 2014-09-24 LAB — I-STAT ARTERIAL BLOOD GAS, ED
Acid-Base Excess: 5 mmol/L — ABNORMAL HIGH (ref 0.0–2.0)
Bicarbonate: 29.2 mEq/L — ABNORMAL HIGH (ref 20.0–24.0)
O2 Saturation: 50 %
PCO2 ART: 43.1 mmHg (ref 35.0–45.0)
PH ART: 7.44 (ref 7.350–7.450)
PO2 ART: 26 mmHg — AB (ref 80.0–100.0)
Patient temperature: 98.9
TCO2: 31 mmol/L (ref 0–100)

## 2014-09-24 LAB — ETHANOL: Alcohol, Ethyl (B): <5 mg/dL (ref <5)

## 2014-09-24 LAB — URINALYSIS, ROUTINE W REFLEX MICROSCOPIC
BILIRUBIN URINE: NEGATIVE
Glucose, UA: NEGATIVE mg/dL
Hgb urine dipstick: NEGATIVE
Ketones, ur: NEGATIVE mg/dL
Leukocytes, UA: NEGATIVE
Nitrite: NEGATIVE
Protein, ur: NEGATIVE mg/dL
Specific Gravity, Urine: 1.044 — ABNORMAL HIGH (ref 1.005–1.030)
Urobilinogen, UA: 0.2 mg/dL (ref 0.0–1.0)
pH: 7 (ref 5.0–8.0)

## 2014-09-24 LAB — I-STAT CG4 LACTIC ACID, ED: Lactic Acid, Venous: 1.67 mmol/L (ref 0.5–2.0)

## 2014-09-24 LAB — ACETAMINOPHEN LEVEL: Acetaminophen (Tylenol), Serum: <10 ug/mL — ABNORMAL LOW (ref 10–30)

## 2014-09-24 LAB — PROTIME-INR
INR: 1.08 (ref 0.00–1.49)
Prothrombin Time: 14.2 seconds (ref 11.6–15.2)

## 2014-09-24 LAB — LIPASE, BLOOD: LIPASE: 13 U/L — AB (ref 22–51)

## 2014-09-24 LAB — RETICULOCYTES
RBC.: 4.06 MIL/uL — ABNORMAL LOW (ref 4.22–5.81)
RETIC COUNT ABSOLUTE: 65 10*3/uL (ref 19.0–186.0)
RETIC CT PCT: 1.6 % (ref 0.4–3.1)

## 2014-09-24 LAB — SALICYLATE LEVEL

## 2014-09-24 LAB — LACTIC ACID, PLASMA: Lactic Acid, Venous: 1.5 mmol/L (ref 0.5–2.0)

## 2014-09-24 LAB — SEDIMENTATION RATE: SED RATE: 28 mm/h — AB (ref 0–16)

## 2014-09-24 LAB — D-DIMER, QUANTITATIVE: D-Dimer, Quant: 0.7 ug/mL-FEU — ABNORMAL HIGH (ref 0.00–0.48)

## 2014-09-24 LAB — C-REACTIVE PROTEIN: CRP: 0.6 mg/dL (ref <1.0)

## 2014-09-24 LAB — BRAIN NATRIURETIC PEPTIDE: B Natriuretic Peptide: 50.4 pg/mL (ref 0.0–100.0)

## 2014-09-24 MED ORDER — MOMETASONE FURO-FORMOTEROL FUM 100-5 MCG/ACT IN AERO
2.0000 | INHALATION_SPRAY | Freq: Two times a day (BID) | RESPIRATORY_TRACT | Status: DC
  Administered 2014-09-25: 2 via RESPIRATORY_TRACT
  Filled 2014-09-24: qty 8.8

## 2014-09-24 MED ORDER — GI COCKTAIL ~~LOC~~: 30.0000 mL | Freq: Four times a day (QID) | ORAL | Status: DC | PRN

## 2014-09-24 MED ORDER — ASPIRIN 81 MG PO CHEW: 324.0000 mg | CHEWABLE_TABLET | Freq: Once | ORAL | Status: DC

## 2014-09-24 MED ORDER — ASPIRIN EC 325 MG PO TBEC: 325.0000 mg | DELAYED_RELEASE_TABLET | Freq: Every day | ORAL | Status: DC

## 2014-09-24 MED ORDER — ACETAMINOPHEN 325 MG PO TABS: 650.0000 mg | ORAL_TABLET | ORAL | Status: DC | PRN

## 2014-09-24 MED ORDER — TEMAZEPAM 15 MG PO CAPS
30.0000 mg | ORAL_CAPSULE | Freq: Every day | ORAL | Status: DC
  Administered 2014-09-24: 30 mg via ORAL
  Filled 2014-09-24: qty 2

## 2014-09-24 MED ORDER — IPRATROPIUM-ALBUTEROL 0.5-2.5 (3) MG/3ML IN SOLN
3.0000 mL | Freq: Two times a day (BID) | RESPIRATORY_TRACT | Status: DC
  Administered 2014-09-25: 3 mL via RESPIRATORY_TRACT
  Filled 2014-09-24: qty 3

## 2014-09-24 MED ORDER — IPRATROPIUM-ALBUTEROL 0.5-2.5 (3) MG/3ML IN SOLN: 3.0000 mL | Freq: Four times a day (QID) | RESPIRATORY_TRACT | Status: DC

## 2014-09-24 MED ORDER — SIMVASTATIN 20 MG PO TABS
20.0000 mg | ORAL_TABLET | Freq: Every evening | ORAL | Status: DC
  Administered 2014-09-24: 20 mg via ORAL
  Filled 2014-09-24: qty 1

## 2014-09-24 MED ORDER — ONDANSETRON HCL 4 MG/2ML IJ SOLN
4.0000 mg | Freq: Four times a day (QID) | INTRAMUSCULAR | Status: DC | PRN
  Administered 2014-09-25: 4 mg via INTRAVENOUS
  Filled 2014-09-24: qty 2

## 2014-09-24 MED ORDER — TRAZODONE HCL 100 MG PO TABS
200.0000 mg | ORAL_TABLET | Freq: Every day | ORAL | Status: DC
  Administered 2014-09-24: 200 mg via ORAL
  Filled 2014-09-24: qty 2

## 2014-09-24 MED ORDER — CYCLOBENZAPRINE HCL 10 MG PO TABS
5.0000 mg | ORAL_TABLET | Freq: Four times a day (QID) | ORAL | Status: DC | PRN
  Administered 2014-09-25: 5 mg via ORAL
  Filled 2014-09-24: qty 1

## 2014-09-24 MED ORDER — GI COCKTAIL ~~LOC~~
30.0000 mL | Freq: Once | ORAL | Status: AC
  Administered 2014-09-24: 30 mL via ORAL
  Filled 2014-09-24: qty 30

## 2014-09-24 MED ORDER — MORPHINE SULFATE 4 MG/ML IJ SOLN
4.0000 mg | Freq: Once | INTRAMUSCULAR | Status: AC
  Administered 2014-09-24: 4 mg via INTRAVENOUS
  Filled 2014-09-24: qty 1

## 2014-09-24 MED ORDER — PANTOPRAZOLE SODIUM 40 MG IV SOLR
40.0000 mg | Freq: Two times a day (BID) | INTRAVENOUS | Status: DC
Start: 2014-09-24 — End: 2014-09-25
  Administered 2014-09-24 – 2014-09-25 (×2): 40 mg via INTRAVENOUS
  Filled 2014-09-24 (×2): qty 40

## 2014-09-24 MED ORDER — NITROGLYCERIN 0.4 MG SL SUBL
SUBLINGUAL_TABLET | SUBLINGUAL | Status: AC
  Administered 2014-09-24: 0.4 mg via SUBLINGUAL
  Filled 2014-09-24: qty 3

## 2014-09-24 MED ORDER — NITROGLYCERIN 0.4 MG SL SUBL
0.4000 mg | SUBLINGUAL_TABLET | SUBLINGUAL | Status: DC | PRN
  Administered 2014-09-24 (×3): 0.4 mg via SUBLINGUAL

## 2014-09-24 MED ORDER — ALBUTEROL SULFATE (2.5 MG/3ML) 0.083% IN NEBU
5.0000 mg | INHALATION_SOLUTION | Freq: Once | RESPIRATORY_TRACT | Status: AC
  Administered 2014-09-24: 5 mg via RESPIRATORY_TRACT
  Filled 2014-09-24: qty 6

## 2014-09-24 MED ORDER — ONDANSETRON HCL 4 MG/2ML IJ SOLN
4.0000 mg | Freq: Once | INTRAMUSCULAR | Status: AC
  Administered 2014-09-24: 4 mg via INTRAVENOUS
  Filled 2014-09-24: qty 2

## 2014-09-24 MED ORDER — QUETIAPINE FUMARATE 50 MG PO TABS
100.0000 mg | ORAL_TABLET | Freq: Two times a day (BID) | ORAL | Status: DC
  Administered 2014-09-24 – 2014-09-25 (×2): 100 mg via ORAL
  Filled 2014-09-24 (×2): qty 2

## 2014-09-24 MED ORDER — LISINOPRIL 10 MG PO TABS
10.0000 mg | ORAL_TABLET | Freq: Every day | ORAL | Status: DC
  Administered 2014-09-25: 10 mg via ORAL
  Filled 2014-09-24: qty 1

## 2014-09-24 MED ORDER — IOHEXOL 350 MG/ML SOLN
100.0000 mL | Freq: Once | INTRAVENOUS | Status: AC | PRN
  Administered 2014-09-24: 100 mL via INTRAVENOUS

## 2014-09-24 MED ORDER — VENLAFAXINE HCL 75 MG PO TABS: 300.0000 mg | ORAL_TABLET | Freq: Every day | ORAL | Status: DC

## 2014-09-24 MED ORDER — MORPHINE SULFATE 2 MG/ML IJ SOLN
2.0000 mg | INTRAMUSCULAR | Status: DC | PRN
  Administered 2014-09-24 – 2014-09-25 (×2): 2 mg via INTRAVENOUS
  Filled 2014-09-24 (×2): qty 1

## 2014-09-24 MED ORDER — POTASSIUM CHLORIDE CRYS ER 20 MEQ PO TBCR
40.0000 meq | EXTENDED_RELEASE_TABLET | Freq: Once | ORAL | Status: AC
  Administered 2014-09-24: 40 meq via ORAL
  Filled 2014-09-24: qty 2

## 2014-09-24 MED ORDER — IPRATROPIUM-ALBUTEROL 0.5-2.5 (3) MG/3ML IN SOLN: 3.0000 mL | Freq: Four times a day (QID) | RESPIRATORY_TRACT | Status: DC | PRN

## 2014-09-24 MED ORDER — LEVOTHYROXINE SODIUM 25 MCG PO TABS
25.0000 ug | ORAL_TABLET | Freq: Every day | ORAL | Status: DC
  Administered 2014-09-25: 25 ug via ORAL
  Filled 2014-09-24: qty 1

## 2014-09-24 MED ORDER — OXYCODONE HCL 5 MG PO TABS
10.0000 mg | ORAL_TABLET | ORAL | Status: DC | PRN
  Administered 2014-09-25 (×3): 10 mg via ORAL
  Filled 2014-09-24 (×4): qty 2

## 2014-09-24 NOTE — Progress Notes (Signed)
Called by [facility], PCP -unassigned 59/M with Chest pain and SoB H/o COPD, CAD, with PCI and stent in 2010 Complaining of chest tightness and dyspnea CXR unremarkable, EKG unchanged, troponin, BNP negative, no wheezing -improved with nitro and Morphine x1 -Accepted to tele, [facility], Team [place], [place]

## 2014-09-24 NOTE — ED Notes (Signed)
ABG results given to MD. I am positive this was venous and not arterial. MD does not want another stick at this time. RT will continue to monitor.

## 2014-09-24 NOTE — ED Notes (Signed)
Pt c/o centralized chest pain started approximately one hour ago while walking in his house.  Pt also had some sob, diaphoresis, nausea and dizziness at the time.  Pt states the pain radiates to his left arm and up his neck with some tingling in his left hand.  Pt under went cholecystectomy 2 weeks ago.  Incisions noted to abdomen,  No infection noted.

## 2014-09-24 NOTE — ED Provider Notes (Signed)
CSN: [id number]     Arrival date & time 09/24/14  0829 History   First MD Initiated Contact with Patient 09/24/14 [phone]     Chief Complaint  Patient presents with  . Chest Pain     (Consider location/radiation/quality/duration/timing/severity/associated sxs/prior Treatment) HPI Comments: Patient presents with chest pain. He appears to be very sleepy and is difficult to understand. History is limited secondary to this. The information I gather is that he's been having some pain in his central and left chest that started during the night last night. He's had some associated shortness of breath that started last night. He denies any cough or chest congestion. He does report some intermittent fevers. He recently had a cholecystectomy about 2 weeks ago and has some mild pain to his upper abdomen but he states it's unchanged from the pain these had post surgery. He denies any urinary symptoms. He denies any alcohol use. He states he takes oxycodone for back pain but he denies taking any today. He is not oxygen requiring. He does have a history of COPD and bipolar disorder. As I'm talking to the patient, he has bizarre statements such as "I cut my swimming time down a minute and half".  Patient is a 59 y.o. male presenting with chest pain.  Chest Pain Associated symptoms: abdominal pain, fatigue, fever and shortness of breath   Associated symptoms: no back pain, no cough, no diaphoresis, no dizziness, no headache, no nausea, no numbness, not vomiting and no weakness     Past Medical History  Diagnosis Date  . COPD (chronic obstructive pulmonary disease)   . Bipolar 1 disorder   . Heart attack   . Additional heart attack   . Hypothyroidism   . Asthma   . Hypertension    Past Surgical History  Procedure Laterality Date  . Coronary angioplasty with stent placement    . Appendectomy    . Tonsillectomy    . Hernia repair    . Knee surgery    . Cholecystectomy     No family history on  file. History  Substance Use Topics  . Smoking status: Current Every Day Smoker  . Smokeless tobacco: Never Used  . Alcohol Use: No    Review of Systems  Constitutional: Positive for fever and fatigue. Negative for chills and diaphoresis.  HENT: Negative for congestion, rhinorrhea and sneezing.   Eyes: Negative.   Respiratory: Positive for shortness of breath. Negative for cough and chest tightness.   Cardiovascular: Positive for chest pain. Negative for leg swelling.  Gastrointestinal: Positive for abdominal pain. Negative for nausea, vomiting, diarrhea and blood in stool.  Genitourinary: Negative for frequency, hematuria, flank pain and difficulty urinating.  Musculoskeletal: Negative for back pain and arthralgias.  Skin: Negative for rash.  Neurological: Negative for dizziness, speech difficulty, weakness, numbness and headaches.      Allergies  Rocephin; Prochlorperazine edisylate; Toradol; and Tramadol  Home Medications   Prior to Admission medications   Medication Sig Start Date End Date Taking? Authorizing Provider  acetaminophen (TYLENOL) 500 MG chewable tablet Chew 1,000 mg by mouth every 6 (six) hours as needed. For pain    Historical Provider, MD  aspirin 325 MG tablet Take 325 mg by mouth daily.    Historical Provider, MD  fluticasone-salmeterol (ADVAIR HFA) 45-21 MCG/ACT inhaler Inhale 1 puff into the lungs 2 (two) times daily.     Historical Provider, MD  levothyroxine (SYNTHROID, LEVOTHROID) 25 MCG tablet Take 25 mcg by mouth daily.  Historical Provider, MD  QUEtiapine (SEROQUEL) 400 MG tablet Take 800 mg by mouth at bedtime.    Historical Provider, MD  simvastatin (ZOCOR) 20 MG tablet Take 20 mg by mouth every evening.    Historical Provider, MD  traZODone (DESYREL) 100 MG tablet Take 200 mg by mouth at bedtime.    Historical Provider, MD  venlafaxine (EFFEXOR) 100 MG tablet Take 300 mg by mouth daily.    Historical Provider, MD   BP 124/109 mmHg  Pulse 70   Temp(Src) 98.9 F (37.2 C)  Resp 11  Ht 6' (1.829 m)  Wt 260 lb (117.935 kg)  BMI 35.25 kg/m2  SpO2 100% Physical Exam  Constitutional: He appears well-developed and well-nourished.  HENT:  Head: Normocephalic and atraumatic.  Eyes: Pupils are equal, round, and reactive to light.  Neck: Normal range of motion. Neck supple.  Cardiovascular: Normal rate, regular rhythm and normal heart sounds.   Pulmonary/Chest: Effort normal and breath sounds normal. No respiratory distress. He has no wheezes. He has no rales. He exhibits no tenderness.  Abdominal: Soft. Bowel sounds are normal. There is tenderness (well healing surgical incisions, mild tenderness to the right upper quadrant). There is no rebound and no guarding.  Musculoskeletal: Normal range of motion. He exhibits no edema.  Lymphadenopathy:    He has no cervical adenopathy.  Neurological:  Patient is drowsy but will wake up and answer questions. He then falls back asleep. He is oriented to person place and year. He moves all extremities symmetrically without focal deficits.  Skin: Skin is warm and dry. No rash noted.  Psychiatric: He has a normal mood and affect.    ED Course  Procedures (including critical care time) Labs Review Results for orders placed or performed during the hospital encounter of 09/24/14  Comprehensive metabolic panel  Result Value Ref Range   Sodium 139 135 - 145 mmol/L   Potassium 3.2 (L) 3.5 - 5.1 mmol/L   Chloride 101 101 - 111 mmol/L   CO2 26 22 - 32 mmol/L   Glucose, Bld 112 (H) 65 - 99 mg/dL   BUN 5 (L) 6 - 20 mg/dL   Creatinine, Ser 0.86 0.61 - 1.24 mg/dL   Calcium 8.8 (L) 8.9 - 10.3 mg/dL   Total Protein 7.0 6.5 - 8.1 g/dL   Albumin 3.5 3.5 - 5.0 g/dL   AST 21 15 - 41 U/L   ALT 17 17 - 63 U/L   Alkaline Phosphatase 80 38 - 126 U/L   Total Bilirubin 0.4 0.3 - 1.2 mg/dL   GFR calc non Af Amer >60 >60 mL/min   GFR calc Af Amer >60 >60 mL/min   Anion gap 12 5 - 15  Ethanol  Result Value  Ref Range   Alcohol, Ethyl (B) <5 <5 mg/dL  CBC with Differential  Result Value Ref Range   WBC 7.4 4.0 - 10.5 K/uL   RBC 4.30 4.22 - 5.81 MIL/uL   Hemoglobin 12.3 (L) 13.0 - 17.0 g/dL   HCT 35.7 (L) 39.0 - 52.0 %   MCV 83.0 78.0 - 100.0 fL   MCH 28.6 26.0 - 34.0 pg   MCHC 34.5 30.0 - 36.0 g/dL   RDW 15.0 11.5 - 15.5 %   Platelets 211 150 - 400 K/uL   Neutrophils Relative % 57 43 - 77 %   Neutro Abs 4.2 1.7 - 7.7 K/uL   Lymphocytes Relative 34 12 - 46 %   Lymphs Abs 2.5 0.7 - 4.0  K/uL   Monocytes Relative 7 3 - 12 %   Monocytes Absolute 0.6 0.1 - 1.0 K/uL   Eosinophils Relative 2 0 - 5 %   Eosinophils Absolute 0.1 0.0 - 0.7 K/uL   Basophils Relative 0 0 - 1 %   Basophils Absolute 0.0 0.0 - 0.1 K/uL  Salicylate level  Result Value Ref Range   Salicylate Lvl <1.9 2.8 - 30.0 mg/dL  Acetaminophen level  Result Value Ref Range   Acetaminophen (Tylenol), Serum <10 (L) 10 - 30 ug/mL  D-dimer, quantitative  Result Value Ref Range   D-Dimer, Quant 0.70 (H) 0.00 - 0.48 ug/mL-FEU  Urinalysis, Routine w reflex microscopic (not at [facility])  Result Value Ref Range   Color, Urine YELLOW YELLOW   APPearance CLEAR CLEAR   Specific Gravity, Urine 1.044 (H) 1.005 - 1.030   pH 7.0 5.0 - 8.0   Glucose, UA NEGATIVE NEGATIVE mg/dL   Hgb urine dipstick NEGATIVE NEGATIVE   Bilirubin Urine NEGATIVE NEGATIVE   Ketones, ur NEGATIVE NEGATIVE mg/dL   Protein, ur NEGATIVE NEGATIVE mg/dL   Urobilinogen, UA 0.2 0.0 - 1.0 mg/dL   Nitrite NEGATIVE NEGATIVE   Leukocytes, UA NEGATIVE NEGATIVE  Urine rapid drug screen (hosp performed)  Result Value Ref Range   Opiates POSITIVE (A) NONE DETECTED   Cocaine NONE DETECTED NONE DETECTED   Benzodiazepines POSITIVE (A) NONE DETECTED   Amphetamines NONE DETECTED NONE DETECTED   Tetrahydrocannabinol NONE DETECTED NONE DETECTED   Barbiturates NONE DETECTED NONE DETECTED  Troponin I  Result Value Ref Range   Troponin I <0.03 <0.031 ng/mL  Brain natriuretic  peptide  Result Value Ref Range   B Natriuretic Peptide 50.4 0.0 - 100.0 pg/mL  Troponin I  Result Value Ref Range   Troponin I <0.03 <0.031 ng/mL  I-Stat CG4 Lactic Acid, ED  Result Value Ref Range   Lactic Acid, Venous 1.67 0.5 - 2.0 mmol/L  I-Stat arterial blood gas, ED  Result Value Ref Range   pH, Arterial 7.440 7.350 - 7.450   pCO2 arterial 43.1 35.0 - 45.0 mmHg   pO2, Arterial 26.0 (LL) 80.0 - 100.0 mmHg   Bicarbonate 29.2 (H) 20.0 - 24.0 mEq/L   TCO2 31 0 - 100 mmol/L   O2 Saturation 50.0 %   Acid-Base Excess 5.0 (H) 0.0 - 2.0 mmol/L   Patient temperature 98.9 F    Collection site RADIAL, ALLEN'S TEST ACCEPTABLE    Drawn by Operator    Sample type ARTERIAL    Comment NOTIFIED PHYSICIAN    Dg Chest 2 View  09/24/2014   CLINICAL DATA:  Chest pain  EXAM: CHEST  2 VIEW  COMPARISON:  July 30, 2014 and September 07, 2014  FINDINGS: There is no edema or consolidation. Heart size and pulmonary vascularity are normal. No adenopathy. No bone lesions. No pneumothorax.  IMPRESSION: No edema or consolidation.   Electronically Signed   By: Lowella Grip III M.D.   On: 09/24/2014 09:30   Ct Angio Chest Pe W/cm &/or Wo Cm  09/24/2014   CLINICAL DATA:  Shortness of breath and chest pain. Recent cholecystectomy.  EXAM: CT ANGIOGRAPHY CHEST WITH CONTRAST  TECHNIQUE: Multidetector CT imaging of the chest was performed using the standard protocol during bolus administration of intravenous contrast. Multiplanar CT image reconstructions and MIPs were obtained to evaluate the vascular anatomy.  CONTRAST:  127m OMNIPAQUE IOHEXOL 350 MG/ML SOLN  COMPARISON:  Chest CT Jul 07, 2014; chest radiograph September 24, 2014 new  FINDINGS: There is no demonstrable pulmonary embolus. There is no thoracic aortic aneurysm or dissection. The visualized great vessels appear unremarkable. The left vertebral artery arises directly from the aortic arch, an anatomic variant.  There is mild patchy atelectasis bilaterally. There is  no airspace consolidation or edema. There is a stable nodular opacity in the anterior segment of the left lower lobe measuring 8 x 7 mm, stable. This nodular opacity is currently seen on axial slice 59 series 6. There is a stable 3 mm nodular opacity in the lateral segment left lower lobe seen on slice 61 series 6. No new parenchymal nodular opacities are identified.  Thyroid appears normal. There is no demonstrable thoracic adenopathy. The pericardium is slightly thickened anteriorly. There is left ventricular hypertrophy. There are multiple foci of coronary artery calcification.  Visualized upper abdominal structures appear unremarkable.  There is degenerative change in the thoracic spine. There are no blastic or lytic bone lesions.  Review of the MIP images confirms the above findings.  IMPRESSION: No demonstrable pulmonary embolus.  Atherosclerotic change. There are multiple foci of coronary artery calcification.  Nodular lesions are again noted in the left lower lobe, largest measuring 8 x 7 mm. Not these nodular opacity should be followed based on Fleischner Society guidelines. If the patient is at high risk for bronchogenic carcinoma, follow-up chest CT at 3-20month is recommended. If the patient is at low risk for bronchogenic carcinoma, follow-up chest CT at 6-12 months is recommended. This recommendation follows the consensus statement: Guidelines for Management of Small Pulmonary Nodules Detected on CT Scans: A Statement from the [place] published in Radiology 2005; 237:395-400.  There is scattered atelectatic change bilaterally. No edema or consolidation. No adenopathy.  Slight anterior pericardial thickening is noted. Significance of this finding is uncertain. There is left ventricular hypertrophy.   Electronically Signed   By: WLowella GripIII M.D.   On: 09/24/2014 12:00      Imaging Review Dg Chest 2 View  09/24/2014   CLINICAL DATA:  Chest pain  EXAM: CHEST  2 VIEW   COMPARISON:  July 30, 2014 and September 07, 2014  FINDINGS: There is no edema or consolidation. Heart size and pulmonary vascularity are normal. No adenopathy. No bone lesions. No pneumothorax.  IMPRESSION: No edema or consolidation.   Electronically Signed   By: WLowella GripIII M.D.   On: 09/24/2014 09:30   Ct Angio Chest Pe W/cm &/or Wo Cm  09/24/2014   CLINICAL DATA:  Shortness of breath and chest pain. Recent cholecystectomy.  EXAM: CT ANGIOGRAPHY CHEST WITH CONTRAST  TECHNIQUE: Multidetector CT imaging of the chest was performed using the standard protocol during bolus administration of intravenous contrast. Multiplanar CT image reconstructions and MIPs were obtained to evaluate the vascular anatomy.  CONTRAST:  1056mOMNIPAQUE IOHEXOL 350 MG/ML SOLN  COMPARISON:  Chest CT Jul 07, 2014; chest radiograph September 24, 2014 new  FINDINGS: There is no demonstrable pulmonary embolus. There is no thoracic aortic aneurysm or dissection. The visualized great vessels appear unremarkable. The left vertebral artery arises directly from the aortic arch, an anatomic variant.  There is mild patchy atelectasis bilaterally. There is no airspace consolidation or edema. There is a stable nodular opacity in the anterior segment of the left lower lobe measuring 8 x 7 mm, stable. This nodular opacity is currently seen on axial slice 59 series 6. There is a stable 3 mm nodular opacity in the lateral segment left lower lobe seen on  slice 61 series 6. No new parenchymal nodular opacities are identified.  Thyroid appears normal. There is no demonstrable thoracic adenopathy. The pericardium is slightly thickened anteriorly. There is left ventricular hypertrophy. There are multiple foci of coronary artery calcification.  Visualized upper abdominal structures appear unremarkable.  There is degenerative change in the thoracic spine. There are no blastic or lytic bone lesions.  Review of the MIP images confirms the above findings.   IMPRESSION: No demonstrable pulmonary embolus.  Atherosclerotic change. There are multiple foci of coronary artery calcification.  Nodular lesions are again noted in the left lower lobe, largest measuring 8 x 7 mm. Not these nodular opacity should be followed based on Fleischner Society guidelines. If the patient is at high risk for bronchogenic carcinoma, follow-up chest CT at 3-57month is recommended. If the patient is at low risk for bronchogenic carcinoma, follow-up chest CT at 6-12 months is recommended. This recommendation follows the consensus statement: Guidelines for Management of Small Pulmonary Nodules Detected on CT Scans: A Statement from the [place] published in Radiology 2005; 237:395-400.  There is scattered atelectatic change bilaterally. No edema or consolidation. No adenopathy.  Slight anterior pericardial thickening is noted. Significance of this finding is uncertain. There is left ventricular hypertrophy.   Electronically Signed   By: WLowella GripIII M.D.   On: 09/24/2014 12:00     EKG Interpretation   Date/Time:  Wednesday September 24 2014 08:37:15 EDT Ventricular Rate:  79 PR Interval:  136 QRS Duration: 74 QT Interval:  414 QTC Calculation: 474 R Axis:   37 Text Interpretation:  Normal sinus rhythm Low voltage QRS Borderline ECG  since last tracing no significant change Confirmed by Laurette Villescas  MD, Ormand Senn  ([id number] on 09/24/2014 9:59:59 AM      MDM   Final diagnoses:  None    While patient is in the ED, he complained of ongoing tightness across his chest. He felt like it also was sitting on his chest. He had associated shortness of breath. He was given nitroglycerin 3 which ultimately relieved his symptoms in conjunction with a dose of morphine. He took a full strength aspirin prior to arrival. In review of his records, he did have an ST elevation MI in 2010 during which time he had a catheterization which stent placement.  His EKG is not showing ischemic  changes. His troponin is negative. However given his history and ongoing chest pain, I will consult the hospitalist for admission.  I did try nebulizer treatment but he did not get relief in symptoms with this. His lungs do not sound significantly wheezy are diminished.   MMalvin Johns MD 09/24/14 [phone]

## 2014-09-24 NOTE — ED Notes (Signed)
Patient transported to X-ray 

## 2014-09-24 NOTE — H&P (Signed)
[facility] History and Physical  Patient: [name]  MRN: [id number]  DOB: 01/09/1956  DOS: the patient was seen and examined on 09/24/2014 PCP: No primary care provider on file.  Referring physician: [facility] Chief Complaint: chest pain and abdominal pain  HPI: [name] is a 59 y.o. male with Past medical history of COPD, morbid obesity, bipolar disorder, hypothyroidism, hypertension, coronary artery disease, recent history of cholecystectomy. The patient is presenting with complaints of abdominal pain with nausea as well as chest pain. This all symptoms have been ongoing chronically occurring on and off. The patient mentions the chest pain has started again since last 2 days. The pain is located on the left side and feels like a sharp stabbing pain. He denies any fever or chills to compose of some cough. He denies any expectoration. He denies any oxygen use at his baseline. He complains of orthopnea as well as PND. He compose of nausea and a chronic right-sided abdominal pain since his surgery. He denies any diarrhea at present but has 4 days of black color bowel movement prior to his arrival. He denies any burning urination or active bleeding anywhere else. He mentions he is compliant with all medications. He denies any  Focal deficit.  The patient is coming from home.  At his baseline ambulates without any support And is independent for most of his ADL manages her medication on his own.  Review of Systems: as mentioned in the history of present illness.  A comprehensive review of the other systems is negative.  Past Medical History  Diagnosis Date  . COPD (chronic obstructive pulmonary disease)   . Bipolar 1 disorder   . Heart attack   . Additional heart attack   . Hypothyroidism   . Asthma   . Hypertension    Past Surgical History  Procedure Laterality Date  . Coronary angioplasty with stent placement    . Appendectomy    .  Tonsillectomy    . Hernia repair    . Knee surgery    . Cholecystectomy     Social History:  reports that he has been smoking.  He has never used smokeless tobacco. He reports that he does not drink alcohol or use illicit drugs.  Allergies  Allergen Reactions  . Rocephin [Ceftriaxone Sodium In Dextrose] Anaphylaxis  . Prochlorperazine Edisylate Other (See Comments)    Childhood Reaction.  . Toradol [Ketorolac Tromethamine] Nausea And Vomiting    Pt states he is not allergic to ibuprofen  . Tramadol Nausea And Vomiting    No family history on file.  Prior to Admission medications   Medication Sig Start Date End Date Taking? Authorizing Provider  lisinopril (PRINIVIL,ZESTRIL) 10 MG tablet Take 10 mg by mouth daily. 07/02/14 07/02/15 Yes Historical Provider, MD  predniSONE (DELTASONE) 10 MG tablet Take 50 mg by mouth daily. Ending 09-26-14 09/22/14 09/26/14 Yes Historical Provider, MD  promethazine (PHENERGAN) 25 MG suppository Place 25 mg rectally every 6 (six) hours as needed. 09/22/14 09/29/14 Yes Historical Provider, MD  promethazine (PHENERGAN) 25 MG tablet Take 25 mg by mouth every 6 (six) hours as needed for nausea.  09/22/14 09/29/14 Yes Historical Provider, MD  acetaminophen (TYLENOL) 500 MG chewable tablet Chew 1,000 mg by mouth every 6 (six) hours as needed. For pain    Historical Provider, MD  aspirin 325 MG tablet Take 325 mg by mouth daily.    Historical Provider, MD  cyclobenzaprine (FLEXERIL) 5 MG tablet Take 5 mg by mouth  every 6 (six) hours as needed (for back pain).  08/26/14   Historical Provider, MD  docusate sodium (STOOL SOFTENER) 100 MG capsule Take 100 mg by mouth 2 (two) times daily.    Historical Provider, MD  fluticasone-salmeterol (ADVAIR HFA) 45-21 MCG/ACT inhaler Inhale 1 puff into the lungs 2 (two) times daily.     Historical Provider, MD  hydrOXYzine (VISTARIL) 25 MG capsule Take 50 mg by mouth at bedtime as needed. 09/02/14   Historical Provider, MD  levothyroxine  (SYNTHROID, LEVOTHROID) 25 MCG tablet Take 25 mcg by mouth daily.    Historical Provider, MD  Multiple Vitamin (MULTI-VITAMINS) TABS Take 1 tablet by mouth daily.    Historical Provider, MD  nitroGLYCERIN (NITROSTAT) 0.4 MG SL tablet Place 0.4 mg under the tongue every 5 (five) minutes as needed for chest pain.     Historical Provider, MD  omeprazole (PRILOSEC) 20 MG capsule Take 20 mg by mouth 2 (two) times daily. 09/19/14   Historical Provider, MD  Oxycodone HCl 10 MG TABS Take 10 mg by mouth every 4 (four) hours as needed (for pain).  09/10/14   Historical Provider, MD  polyethylene glycol (MIRALAX / GLYCOLAX) packet Take 17 g by mouth daily as needed for mild constipation.     Historical Provider, MD  promethazine (PHENERGAN) 25 MG tablet Take 25 mg by mouth every 6 (six) hours as needed for nausea.  09/22/14   Historical Provider, MD  QUEtiapine (SEROQUEL) 100 MG tablet Take 100 mg by mouth 2 (two) times daily. 09/12/14   Historical Provider, MD  simvastatin (ZOCOR) 20 MG tablet Take 20 mg by mouth every evening.    Historical Provider, MD  temazepam (RESTORIL) 30 MG capsule Take 30 mg by mouth daily. 09/23/14   Historical Provider, MD  traZODone (DESYREL) 100 MG tablet Take 200 mg by mouth at bedtime.    Historical Provider, MD  venlafaxine (EFFEXOR) 100 MG tablet Take 300 mg by mouth daily.    Historical Provider, MD    Physical Exam: Filed Vitals:   09/24/14 1800 09/24/14 1858 09/24/14 1937 09/24/14 2109  BP: 147/67 124/78 131/95   Pulse: 64 71 69 69  Temp:  98 F (36.7 C) 98.3 F (36.8 C)   TempSrc:  Oral Oral   Resp: '17 19 12 17  '$ Height:  6' (1.829 m)    Weight:  117.255 kg (258 lb 8 oz)    SpO2: 92% 100% 100% 92%    General: Alert, Awake and Oriented to Time, Place and Person. Appear in moderate distress Eyes: PERRL ENT: Oral Mucosa clear moist. Neck: present JVD Cardiovascular: S1 and S2 Present, no Murmur, Peripheral Pulses Present Respiratory: Bilateral Air entry equal and  Decreased,  Faint basal Crackles, bilateral expiratory wheezes Abdomen: Bowel Sound present, Soft and right-sided tender Skin: non Rash Extremities: no Pedal edema, no calf tenderness Neurologic: Grossly no focal neuro deficit.  Labs on Admission:  CBC:  Recent Labs Lab 09/24/14 0900 09/24/14 2130  WBC 7.4 8.4  NEUTROABS 4.2 4.3  HGB 12.3* 11.7*  HCT 35.7* 33.6*  MCV 83.0 82.8  PLT 211 171    CMP     Component Value Date/Time   NA 138 09/24/2014 2130   K 2.9* 09/24/2014 2130   CL 103 09/24/2014 2130   CO2 25 09/24/2014 2130   GLUCOSE 118* 09/24/2014 2130   BUN 6 09/24/2014 2130   CREATININE 0.83 09/24/2014 2130   CALCIUM 8.3* 09/24/2014 2130   PROT 6.0* 09/24/2014 2130  ALBUMIN 3.1* 09/24/2014 2130   AST 18 09/24/2014 2130   ALT 17 09/24/2014 2130   ALKPHOS 73 09/24/2014 2130   BILITOT 0.4 09/24/2014 2130   GFRNONAA >60 09/24/2014 2130   GFRAA >60 09/24/2014 2130     Recent Labs Lab 09/24/14 2130  LIPASE 13*     Recent Labs Lab 09/24/14 0900 09/24/14 1355 09/24/14 2130  TROPONINI <0.03 <0.03 <0.03   BNP (last 3 results)  Recent Labs  09/24/14 1150  BNP 50.4    ProBNP (last 3 results) No results for input(s): PROBNP in the last 8760 hours.   Radiological Exams on Admission: Dg Chest 2 View  09/24/2014   CLINICAL DATA:  Chest pain  EXAM: CHEST  2 VIEW  COMPARISON:  July 30, 2014 and September 07, 2014  FINDINGS: There is no edema or consolidation. Heart size and pulmonary vascularity are normal. No adenopathy. No bone lesions. No pneumothorax.  IMPRESSION: No edema or consolidation.   Electronically Signed   By: Lowella Grip III M.D.   On: 09/24/2014 09:30   Ct Angio Chest Pe W/cm &/or Wo Cm  09/24/2014   CLINICAL DATA:  Shortness of breath and chest pain. Recent cholecystectomy.  EXAM: CT ANGIOGRAPHY CHEST WITH CONTRAST  TECHNIQUE: Multidetector CT imaging of the chest was performed using the standard protocol during bolus administration of  intravenous contrast. Multiplanar CT image reconstructions and MIPs were obtained to evaluate the vascular anatomy.  CONTRAST:  188m OMNIPAQUE IOHEXOL 350 MG/ML SOLN  COMPARISON:  Chest CT Jul 07, 2014; chest radiograph September 24, 2014 new  FINDINGS: There is no demonstrable pulmonary embolus. There is no thoracic aortic aneurysm or dissection. The visualized great vessels appear unremarkable. The left vertebral artery arises directly from the aortic arch, an anatomic variant.  There is mild patchy atelectasis bilaterally. There is no airspace consolidation or edema. There is a stable nodular opacity in the anterior segment of the left lower lobe measuring 8 x 7 mm, stable. This nodular opacity is currently seen on axial slice 59 series 6. There is a stable 3 mm nodular opacity in the lateral segment left lower lobe seen on slice 61 series 6. No new parenchymal nodular opacities are identified.  Thyroid appears normal. There is no demonstrable thoracic adenopathy. The pericardium is slightly thickened anteriorly. There is left ventricular hypertrophy. There are multiple foci of coronary artery calcification.  Visualized upper abdominal structures appear unremarkable.  There is degenerative change in the thoracic spine. There are no blastic or lytic bone lesions.  Review of the MIP images confirms the above findings.  IMPRESSION: No demonstrable pulmonary embolus.  Atherosclerotic change. There are multiple foci of coronary artery calcification.  Nodular lesions are again noted in the left lower lobe, largest measuring 8 x 7 mm. Not these nodular opacity should be followed based on Fleischner Society guidelines. If the patient is at high risk for bronchogenic carcinoma, follow-up chest CT at [phone] recommended. If the patient is at low risk for bronchogenic carcinoma, follow-up chest CT at 6-12 months is recommended. This recommendation follows the consensus statement: Guidelines for Management of Small  Pulmonary Nodules Detected on CT Scans: A Statement from the [employer] published in Radiology 2005; 237:395-400.  There is scattered atelectatic change bilaterally. No edema or consolidation. No adenopathy.  Slight anterior pericardial thickening is noted. Significance of this finding is uncertain. There is left ventricular hypertrophy.   Electronically Signed   By: WiLowella GripII M.D.  On: 09/24/2014 12:00   EKG: Independently reviewed. normal sinus rhythm, nonspecific ST and T waves changes.  Assessment/Plan Principal Problem:   COPD exacerbation Active Problems:   Obesity   MYOCARDIAL INFARCTION, HX OF   Coronary atherosclerosis   Chest pain   Status post cholecystectomy   1. COPD exacerbation The patient is presenting with numbness of progressively worsening shortness of breath as well as chest pain as well as nausea and abdominal pain. The patient has recently undergone cholecystectomy and has right-sided abdominal pain since then. The patient had CT scan with PE protocol which was negative for any pulmonary embolism. He has bilateral expiratory wheezing which is suggestive for possible COPD exacerbation. The patient has recently completed 2 courses of antibiotics for COPD exacerbation as well as course of prednisone twice. Currently I would continue him on prednisone and also DuoNeb's. We'll monitor him on telemetry.  2.chest pain. Less likely cardiac in etiology. We'll monitor serial troponin. Echocardiogram in the morning. Patient remains nothing by mouth after midnight.  3.right-sided abdominal pain. The patient had multiple CT scans and ER visit after his recent cholecystectomy due to his right-sided abdominal pain. At present his lactic acid as well as lipase levels are negative. LFTs are stable. With this chronic abdominal pain most likely is postoperative. Continue close monitoring. Right upper quadrant ultrasound in the  morning.  4.hypothyroidism. Continue Synthroid.  5.motor disorder. Continue home medications.   Advance goals of care discussion: full code as per my discussion with patient. Patient's daughter will be his power of attorney.   DVT Prophylaxis: subcutaneous Heparin Nutrition: nothing by mouth except medications  Disposition: Admitted as inpatient,stepdown unit.  Author: Berle Mull, MD [facility] Pager: [phone] 09/24/2014  If 7PM-7AM, please contact night-coverage [email or web address] Password [email or web address]

## 2014-09-25 ENCOUNTER — Ambulatory Visit (HOSPITAL_COMMUNITY): Payer: Medicare HMO

## 2014-09-25 ENCOUNTER — Inpatient Hospital Stay (HOSPITAL_COMMUNITY): Payer: Medicare HMO

## 2014-09-25 DIAGNOSIS — Z9049 Acquired absence of other specified parts of digestive tract: Secondary | ICD-10-CM

## 2014-09-25 DIAGNOSIS — R072 Precordial pain: Secondary | ICD-10-CM

## 2014-09-25 DIAGNOSIS — J441 Chronic obstructive pulmonary disease with (acute) exacerbation: Principal | ICD-10-CM

## 2014-09-25 HISTORY — DX: Acquired absence of other specified parts of digestive tract: Z90.49

## 2014-09-25 LAB — PROCALCITONIN: Procalcitonin: <0.1 ng/mL

## 2014-09-25 LAB — COMPREHENSIVE METABOLIC PANEL
ALT: 17 U/L (ref 17–63)
AST: 20 U/L (ref 15–41)
Albumin: 3.4 g/dL — ABNORMAL LOW (ref 3.5–5.0)
Alkaline Phosphatase: 78 U/L (ref 38–126)
Anion gap: 10 (ref 5–15)
CALCIUM: 8.7 mg/dL — AB (ref 8.9–10.3)
CO2: 25 mmol/L (ref 22–32)
Chloride: 104 mmol/L (ref 101–111)
Creatinine, Ser: 0.83 mg/dL (ref 0.61–1.24)
GFR calc non Af Amer: >60 mL/min (ref >60)
Glucose, Bld: 94 mg/dL (ref 65–99)
Potassium: 4 mmol/L (ref 3.5–5.1)
Sodium: 139 mmol/L (ref 135–145)
Total Bilirubin: 0.5 mg/dL (ref 0.3–1.2)
Total Protein: 6.5 g/dL (ref 6.5–8.1)

## 2014-09-25 LAB — CBC WITH DIFFERENTIAL/PLATELET
BASOS PCT: 0 % (ref 0–1)
Basophils Absolute: 0 10*3/uL (ref 0.0–0.1)
Eosinophils Absolute: 0.2 10*3/uL (ref 0.0–0.7)
Eosinophils Relative: 3 % (ref 0–5)
HCT: 37.8 % — ABNORMAL LOW (ref 39.0–52.0)
Hemoglobin: 12.8 g/dL — ABNORMAL LOW (ref 13.0–17.0)
Lymphocytes Relative: 34 % (ref 12–46)
Lymphs Abs: 2.4 10*3/uL (ref 0.7–4.0)
MCH: 28.2 pg (ref 26.0–34.0)
MCHC: 33.9 g/dL (ref 30.0–36.0)
MCV: 83.3 fL (ref 78.0–100.0)
Monocytes Absolute: 0.4 10*3/uL (ref 0.1–1.0)
Monocytes Relative: 5 % (ref 3–12)
Neutro Abs: 4.2 10*3/uL (ref 1.7–7.7)
Neutrophils Relative %: 58 % (ref 43–77)
Platelets: 188 10*3/uL (ref 150–400)
RBC: 4.54 MIL/uL (ref 4.22–5.81)
RDW: 15 % (ref 11.5–15.5)
WBC: 7.2 10*3/uL (ref 4.0–10.5)

## 2014-09-25 LAB — TROPONIN I: Troponin I: <0.03 ng/mL (ref <0.031)

## 2014-09-25 LAB — LACTIC ACID, PLASMA: Lactic Acid, Venous: 1.8 mmol/L (ref 0.5–2.0)

## 2014-09-25 LAB — GLUCOSE, CAPILLARY: Glucose-Capillary: 85 mg/dL (ref 65–99)

## 2014-09-25 MED ORDER — PROMETHAZINE HCL 25 MG PO TABS: 25.0000 mg | ORAL_TABLET | Freq: Four times a day (QID) | ORAL | Status: DC | PRN

## 2014-09-25 MED ORDER — METHYLPREDNISOLONE SODIUM SUCC 40 MG IJ SOLR
40.0000 mg | Freq: Two times a day (BID) | INTRAMUSCULAR | Status: DC
  Administered 2014-09-25: 40 mg via INTRAVENOUS
  Filled 2014-09-25: qty 1

## 2014-09-25 MED ORDER — POTASSIUM CHLORIDE CRYS ER 20 MEQ PO TBCR
40.0000 meq | EXTENDED_RELEASE_TABLET | Freq: Three times a day (TID) | ORAL | Status: DC
  Administered 2014-09-25: 40 meq via ORAL
  Filled 2014-09-25: qty 4

## 2014-09-25 MED ORDER — CYCLOBENZAPRINE HCL 5 MG PO TABS: 5.0000 mg | ORAL_TABLET | Freq: Four times a day (QID) | ORAL | Status: DC | PRN

## 2014-09-25 MED ORDER — VENLAFAXINE HCL ER 75 MG PO CP24
75.0000 mg | ORAL_CAPSULE | Freq: Every day | ORAL | Status: DC
  Administered 2014-09-25: 75 mg via ORAL
  Filled 2014-09-25 (×2): qty 1

## 2014-09-25 NOTE — Discharge Summary (Signed)
Physician Discharge Summary  [name] MRN: [id number] DOB/AGE: [birth year] 60 y.o.  PCP: No primary care provider on file.   Admit date: 09/24/2014 Discharge date: 09/25/2014  Discharge Diagnoses:     Principal Problem:   COPD exacerbation Active Problems:   Obesity   MYOCARDIAL INFARCTION, HX OF   Coronary atherosclerosis   Chest pain   Status post cholecystectomy    Follow-up recommendations Follow-up with PCP in 3-5 days , including although additional recommended appointments as below Follow-up CBC, CMP in 3-5 days Patient is to follow-up with his outpatient cardiologist in [facility] for further risk stratification Patient was discharged from [facility] after completion of 2-D echo Patient would need follow-up chest CT at 3-61months , to be arranged for by PCP    Medication List    TAKE these medications        acetaminophen 500 MG chewable tablet  Commonly known as:  TYLENOL  Chew 1,000 mg by mouth every 6 (six) hours as needed. For pain     aspirin 325 MG tablet  Take 325 mg by mouth daily.     cyclobenzaprine 5 MG tablet  Commonly known as:  FLEXERIL  Take 5 mg by mouth every 6 (six) hours as needed (for back pain).     fluticasone-salmeterol 45-21 MCG/ACT inhaler  Commonly known as:  ADVAIR HFA  Inhale 1 puff into the lungs 2 (two) times daily.     hydrOXYzine 25 MG capsule  Commonly known as:  VISTARIL  Take 50 mg by mouth at bedtime as needed.     levothyroxine 25 MCG tablet  Commonly known as:  SYNTHROID, LEVOTHROID  Take 25 mcg by mouth daily.     lisinopril 10 MG tablet  Commonly known as:  PRINIVIL,ZESTRIL  Take 10 mg by mouth daily.     MULTI-VITAMINS Tabs  Take 1 tablet by mouth daily.     nitroGLYCERIN 0.4 MG SL tablet  Commonly known as:  NITROSTAT  Place 0.4 mg under the tongue every 5 (five) minutes as needed for chest pain.     omeprazole 20 MG capsule  Commonly known as:  PRILOSEC  Take 20 mg by mouth 2 (two) times  daily.     Oxycodone HCl 10 MG Tabs  Take 10 mg by mouth every 4 (four) hours as needed (for pain).     polyethylene glycol packet  Commonly known as:  MIRALAX / GLYCOLAX  Take 17 g by mouth daily as needed for mild constipation.     predniSONE 10 MG tablet  Commonly known as:  DELTASONE  Take 50 mg by mouth daily. Ending 09-26-14     promethazine 25 MG suppository  Commonly known as:  PHENERGAN  Place 25 mg rectally every 6 (six) hours as needed.     promethazine 25 MG tablet  Commonly known as:  PHENERGAN  Take 1 tablet (25 mg total) by mouth every 6 (six) hours as needed for nausea.     QUEtiapine 100 MG tablet  Commonly known as:  SEROQUEL  Take 100 mg by mouth 2 (two) times daily.     simvastatin 20 MG tablet  Commonly known as:  ZOCOR  Take 20 mg by mouth every evening.     STOOL SOFTENER 100 MG capsule  Generic drug:  docusate sodium  Take 100 mg by mouth 2 (two) times daily.     temazepam 30 MG capsule  Commonly known as:  RESTORIL  Take 30 mg by  mouth daily.     traZODone 100 MG tablet  Commonly known as:  DESYREL  Take 200 mg by mouth at bedtime.     venlafaxine 100 MG tablet  Commonly known as:  EFFEXOR  Take 300 mg by mouth daily.         Discharge Condition: Stable    Disposition: 02-Short Term Hospital   Consults:  Cardiology   Significant Diagnostic Studies:  Dg Chest 2 View  09/24/2014   CLINICAL DATA:  Chest pain  EXAM: CHEST  2 VIEW  COMPARISON:  July 30, 2014 and September 07, 2014  FINDINGS: There is no edema or consolidation. Heart size and pulmonary vascularity are normal. No adenopathy. No bone lesions. No pneumothorax.  IMPRESSION: No edema or consolidation.   Electronically Signed   By: Lowella Grip III M.D.   On: 09/24/2014 09:30   Ct Angio Chest Pe W/cm &/or Wo Cm  09/24/2014   CLINICAL DATA:  Shortness of breath and chest pain. Recent cholecystectomy.  EXAM: CT ANGIOGRAPHY CHEST WITH CONTRAST  TECHNIQUE: Multidetector CT  imaging of the chest was performed using the standard protocol during bolus administration of intravenous contrast. Multiplanar CT image reconstructions and MIPs were obtained to evaluate the vascular anatomy.  CONTRAST:  150mL OMNIPAQUE IOHEXOL 350 MG/ML SOLN  COMPARISON:  Chest CT Jul 07, 2014; chest radiograph September 24, 2014 new  FINDINGS: There is no demonstrable pulmonary embolus. There is no thoracic aortic aneurysm or dissection. The visualized great vessels appear unremarkable. The left vertebral artery arises directly from the aortic arch, an anatomic variant.  There is mild patchy atelectasis bilaterally. There is no airspace consolidation or edema. There is a stable nodular opacity in the anterior segment of the left lower lobe measuring 8 x 7 mm, stable. This nodular opacity is currently seen on axial slice 59 series 6. There is a stable 3 mm nodular opacity in the lateral segment left lower lobe seen on slice 61 series 6. No new parenchymal nodular opacities are identified.  Thyroid appears normal. There is no demonstrable thoracic adenopathy. The pericardium is slightly thickened anteriorly. There is left ventricular hypertrophy. There are multiple foci of coronary artery calcification.  Visualized upper abdominal structures appear unremarkable.  There is degenerative change in the thoracic spine. There are no blastic or lytic bone lesions.  Review of the MIP images confirms the above findings.  IMPRESSION: No demonstrable pulmonary embolus.  Atherosclerotic change. There are multiple foci of coronary artery calcification.  Nodular lesions are again noted in the left lower lobe, largest measuring 8 x 7 mm. Not these nodular opacity should be followed based on Fleischner Society guidelines. If the patient is at high risk for bronchogenic carcinoma, follow-up chest CT at 3-57months is recommended. If the patient is at low risk for bronchogenic carcinoma, follow-up chest CT at 6-12 months is recommended.  This recommendation follows the consensus statement: Guidelines for Management of Small Pulmonary Nodules Detected on CT Scans: A Statement from the [place] as published in Radiology 2005; 237:395-400.  There is scattered atelectatic change bilaterally. No edema or consolidation. No adenopathy.  Slight anterior pericardial thickening is noted. Significance of this finding is uncertain. There is left ventricular hypertrophy.   Electronically Signed   By: Lowella Grip III M.D.   On: 09/24/2014 12:00   US Abdomen Limited Ruq  09/25/2014   CLINICAL DATA:  Right upper quadrant abdominal pain for the past 2 weeks since cholecystectomy  EXAM: US ABDOMEN LIMITED -  RIGHT UPPER QUADRANT  COMPARISON:  Abdominal ultrasound of September 07, 2014 performed preoperatively.  FINDINGS: Gallbladder:  The gallbladder is surgically absent. There are no abnormal echoes observed in the gallbladder fossa.  Common bile duct:  Diameter: 4.8 mm  Liver:  The hepatic echotexture remains increased diffusely. There is no focal mass or ductal dilation.  IMPRESSION: 1. There is no acute abnormality demonstrated in the gallbladder fossa. 2. Fatty infiltrative change of the liver.   Electronically Signed   By: David  Martinique M.D.   On: 09/25/2014 08:55     Echocardiogram Pending at this time    [facility] Weights   09/24/14 0840 09/24/14 1858 09/25/14 0400  Weight: 117.935 kg (260 lb) 117.255 kg (258 lb 8 oz) 116.075 kg (255 lb 14.4 oz)     Microbiology: No results found for this or any previous visit (from the past 240 hour(s)).     Blood Culture    Component Value Date/Time   SDES STOOL 01/28/2008 0223   SPECREQUEST IMMUNE:NORM 01/28/2008 0223   CULT  01/28/2008 0207    NO SALMONELLA, SHIGELLA, CAMPYLOBACTER, OR YERSINIA ISOLATED   REPTSTATUS 01/28/2008 FINAL 01/28/2008 0223      Labs: Results for orders placed or performed during the hospital encounter of 09/24/14 (from the past 48 hour(s))  Comprehensive  metabolic panel     Status: Abnormal   Collection Time: 09/24/14  9:00 AM  Result Value Ref Range   Sodium 139 135 - 145 mmol/L   Potassium 3.2 (L) 3.5 - 5.1 mmol/L   Chloride 101 101 - 111 mmol/L   CO2 26 22 - 32 mmol/L   Glucose, Bld 112 (H) 65 - 99 mg/dL   BUN 5 (L) 6 - 20 mg/dL   Creatinine, Ser 0.86 0.61 - 1.24 mg/dL   Calcium 8.8 (L) 8.9 - 10.3 mg/dL   Total Protein 7.0 6.5 - 8.1 g/dL   Albumin 3.5 3.5 - 5.0 g/dL   AST 21 15 - 41 U/L   ALT 17 17 - 63 U/L   Alkaline Phosphatase 80 38 - 126 U/L   Total Bilirubin 0.4 0.3 - 1.2 mg/dL   GFR calc non Af Amer >60 >60 mL/min   GFR calc Af Amer >60 >60 mL/min    Comment: (NOTE) The eGFR has been calculated using the CKD EPI equation. This calculation has not been validated in all clinical situations. eGFR's persistently <60 mL/min signify possible Chronic Kidney Disease.    Anion gap 12 5 - 15  Ethanol     Status: None   Collection Time: 09/24/14  9:00 AM  Result Value Ref Range   Alcohol, Ethyl (B) <5 <5 mg/dL    Comment:        LOWEST DETECTABLE LIMIT FOR SERUM ALCOHOL IS 5 mg/dL FOR MEDICAL PURPOSES ONLY   CBC with Differential     Status: Abnormal   Collection Time: 09/24/14  9:00 AM  Result Value Ref Range   WBC 7.4 4.0 - 10.5 K/uL   RBC 4.30 4.22 - 5.81 MIL/uL   Hemoglobin 12.3 (L) 13.0 - 17.0 g/dL   HCT 35.7 (L) 39.0 - 52.0 %   MCV 83.0 78.0 - 100.0 fL   MCH 28.6 26.0 - 34.0 pg   MCHC 34.5 30.0 - 36.0 g/dL   RDW 15.0 11.5 - 15.5 %   Platelets 211 150 - 400 K/uL   Neutrophils Relative % 57 43 - 77 %   Neutro Abs 4.2 1.7 - 7.7 K/uL  Lymphocytes Relative 34 12 - 46 %   Lymphs Abs 2.5 0.7 - 4.0 K/uL   Monocytes Relative 7 3 - 12 %   Monocytes Absolute 0.6 0.1 - 1.0 K/uL   Eosinophils Relative 2 0 - 5 %   Eosinophils Absolute 0.1 0.0 - 0.7 K/uL   Basophils Relative 0 0 - 1 %   Basophils Absolute 0.0 0.0 - 0.1 K/uL    Comment: Performed at [facility]  Salicylate level     Status: None   Collection  Time: 09/24/14  9:00 AM  Result Value Ref Range   Salicylate Lvl <4.0 2.8 - 30.0 mg/dL  Acetaminophen level     Status: Abnormal   Collection Time: 09/24/14  9:00 AM  Result Value Ref Range   Acetaminophen (Tylenol), Serum <10 (L) 10 - 30 ug/mL    Comment:        THERAPEUTIC CONCENTRATIONS VARY SIGNIFICANTLY. A RANGE OF 10-30 ug/mL MAY BE AN EFFECTIVE CONCENTRATION FOR MANY PATIENTS. HOWEVER, SOME ARE BEST TREATED AT CONCENTRATIONS OUTSIDE THIS RANGE. ACETAMINOPHEN CONCENTRATIONS >150 ug/mL AT 4 HOURS AFTER INGESTION AND >50 ug/mL AT 12 HOURS AFTER INGESTION ARE OFTEN ASSOCIATED WITH TOXIC REACTIONS.   D-dimer, quantitative     Status: Abnormal   Collection Time: 09/24/14  9:00 AM  Result Value Ref Range   D-Dimer, Quant 0.70 (H) 0.00 - 0.48 ug/mL-FEU    Comment:        AT THE INHOUSE ESTABLISHED CUTOFF VALUE OF 0.48 ug/mL FEU, THIS ASSAY HAS BEEN DOCUMENTED IN THE LITERATURE TO HAVE A SENSITIVITY AND NEGATIVE PREDICTIVE VALUE OF AT LEAST 98 TO 99%.  THE TEST RESULT SHOULD BE CORRELATED WITH AN ASSESSMENT OF THE CLINICAL PROBABILITY OF DVT / VTE.   Troponin I     Status: None   Collection Time: 09/24/14  9:00 AM  Result Value Ref Range   Troponin I <0.03 <0.031 ng/mL    Comment:        NO INDICATION OF MYOCARDIAL INJURY.   I-Stat CG4 Lactic Acid, ED     Status: None   Collection Time: 09/24/14  9:20 AM  Result Value Ref Range   Lactic Acid, Venous 1.67 0.5 - 2.0 mmol/L  I-Stat arterial blood gas, ED     Status: Abnormal   Collection Time: 09/24/14  9:20 AM  Result Value Ref Range   pH, Arterial 7.440 7.350 - 7.450   pCO2 arterial 43.1 35.0 - 45.0 mmHg   pO2, Arterial 26.0 (LL) 80.0 - 100.0 mmHg   Bicarbonate 29.2 (H) 20.0 - 24.0 mEq/L   TCO2 31 0 - 100 mmol/L   O2 Saturation 50.0 %   Acid-Base Excess 5.0 (H) 0.0 - 2.0 mmol/L   Patient temperature 98.9 F    Collection site RADIAL, ALLEN'S TEST ACCEPTABLE    Drawn by Operator    Sample type ARTERIAL     Comment NOTIFIED PHYSICIAN   Brain natriuretic peptide     Status: None   Collection Time: 09/24/14 11:50 AM  Result Value Ref Range   B Natriuretic Peptide 50.4 0.0 - 100.0 pg/mL  Troponin I     Status: None   Collection Time: 09/24/14  1:55 PM  Result Value Ref Range   Troponin I <0.03 <0.031 ng/mL    Comment:        NO INDICATION OF MYOCARDIAL INJURY.   Urinalysis, Routine w reflex microscopic (not at [facility])     Status: Abnormal   Collection Time: 09/24/14  2:00 PM  Result Value Ref Range   Color, Urine YELLOW YELLOW   APPearance CLEAR CLEAR   Specific Gravity, Urine 1.044 (H) 1.005 - 1.030   pH 7.0 5.0 - 8.0   Glucose, UA NEGATIVE NEGATIVE mg/dL   Hgb urine dipstick NEGATIVE NEGATIVE   Bilirubin Urine NEGATIVE NEGATIVE   Ketones, ur NEGATIVE NEGATIVE mg/dL   Protein, ur NEGATIVE NEGATIVE mg/dL   Urobilinogen, UA 0.2 0.0 - 1.0 mg/dL   Nitrite NEGATIVE NEGATIVE   Leukocytes, UA NEGATIVE NEGATIVE    Comment: MICROSCOPIC NOT DONE ON URINES WITH NEGATIVE PROTEIN, BLOOD, LEUKOCYTES, NITRITE, OR GLUCOSE <1000 mg/dL.  Urine rapid drug screen (hosp performed)     Status: Abnormal   Collection Time: 09/24/14  2:00 PM  Result Value Ref Range   Opiates POSITIVE (A) NONE DETECTED   Cocaine NONE DETECTED NONE DETECTED   Benzodiazepines POSITIVE (A) NONE DETECTED   Amphetamines NONE DETECTED NONE DETECTED   Tetrahydrocannabinol NONE DETECTED NONE DETECTED   Barbiturates NONE DETECTED NONE DETECTED    Comment:        DRUG SCREEN FOR MEDICAL PURPOSES ONLY.  IF CONFIRMATION IS NEEDED FOR ANY PURPOSE, NOTIFY LAB WITHIN 5 DAYS.        LOWEST DETECTABLE LIMITS FOR URINE DRUG SCREEN Drug Class       Cutoff (ng/mL) Amphetamine      1000 Barbiturate      200 Benzodiazepine   809 Tricyclics       983 Opiates          300 Cocaine          300 THC              50   Procalcitonin - Baseline     Status: None   Collection Time: 09/24/14  8:34 PM  Result Value Ref Range   Procalcitonin  <0.10 ng/mL    Comment:        Interpretation: PCT (Procalcitonin) <= 0.5 ng/mL: Systemic infection (sepsis) is not likely. Local bacterial infection is possible. REPEATED TO VERIFY (NOTE)         ICU PCT Algorithm               Non ICU PCT Algorithm    ----------------------------     ------------------------------         PCT < 0.25 ng/mL                 PCT < 0.1 ng/mL     Stopping of antibiotics            Stopping of antibiotics       strongly encouraged.               strongly encouraged.    ----------------------------     ------------------------------       PCT level decrease by               PCT < 0.25 ng/mL       >= 80% from peak PCT       OR PCT 0.25 - 0.5 ng/mL          Stopping of antibiotics                                             encouraged.     Stopping of antibiotics  encouraged.    ----------------------------     ------------------------------       PCT level decrease by              PCT >= 0.25 ng/mL       < 80% from peak PCT        AND PCT >= 0.5 ng/mL             Continuing antibiotics                                              encouraged.       Continuing antibiotics            encouraged.    ----------------------------     ------------------------------     PCT level increase compared          PCT > 0.5 ng/mL         with peak PCT AND          PCT >= 0.5 ng/mL             Escalation of antibiotics                                          strongly encouraged.      Escalation of antibiotics        strongly encouraged.   CBC with Differential/Platelet     Status: Abnormal   Collection Time: 09/24/14  9:30 PM  Result Value Ref Range   WBC 8.4 4.0 - 10.5 K/uL    Comment: REPEATED TO VERIFY WHITE COUNT CONFIRMED ON SMEAR    RBC 4.06 (L) 4.22 - 5.81 MIL/uL   Hemoglobin 11.7 (L) 13.0 - 17.0 g/dL   HCT 33.6 (L) 39.0 - 52.0 %   MCV 82.8 78.0 - 100.0 fL   MCH 28.8 26.0 - 34.0 pg   MCHC 34.8 30.0 - 36.0 g/dL   RDW 15.0 11.5 - 15.5 %    Platelets 171 150 - 400 K/uL    Comment: REPEATED TO VERIFY PLATELET COUNT CONFIRMED BY SMEAR    Neutrophils Relative % 52 43 - 77 %   Lymphocytes Relative 40 12 - 46 %   Monocytes Relative 6 3 - 12 %   Eosinophils Relative 2 0 - 5 %   Basophils Relative 0 0 - 1 %   Neutro Abs 4.3 1.7 - 7.7 K/uL   Lymphs Abs 3.4 0.7 - 4.0 K/uL   Monocytes Absolute 0.5 0.1 - 1.0 K/uL   Eosinophils Absolute 0.2 0.0 - 0.7 K/uL   Basophils Absolute 0.0 0.0 - 0.1 K/uL  Comprehensive metabolic panel     Status: Abnormal   Collection Time: 09/24/14  9:30 PM  Result Value Ref Range   Sodium 138 135 - 145 mmol/L   Potassium 2.9 (L) 3.5 - 5.1 mmol/L   Chloride 103 101 - 111 mmol/L   CO2 25 22 - 32 mmol/L   Glucose, Bld 118 (H) 65 - 99 mg/dL   BUN 6 6 - 20 mg/dL   Creatinine, Ser 0.83 0.61 - 1.24 mg/dL   Calcium 8.3 (L) 8.9 - 10.3 mg/dL   Total Protein 6.0 (L) 6.5 - 8.1 g/dL   Albumin 3.1 (L) 3.5 - 5.0 g/dL  AST 18 15 - 41 U/L   ALT 17 17 - 63 U/L   Alkaline Phosphatase 73 38 - 126 U/L   Total Bilirubin 0.4 0.3 - 1.2 mg/dL   GFR calc non Af Amer >60 >60 mL/min   GFR calc Af Amer >60 >60 mL/min    Comment: (NOTE) The eGFR has been calculated using the CKD EPI equation. This calculation has not been validated in all clinical situations. eGFR's persistently <60 mL/min signify possible Chronic Kidney Disease.    Anion gap 10 5 - 15  Protime-INR     Status: None   Collection Time: 09/24/14  9:30 PM  Result Value Ref Range   Prothrombin Time 14.2 11.6 - 15.2 seconds   INR 1.08 0.00 - 1.49  Lactic acid, plasma     Status: None   Collection Time: 09/24/14  9:30 PM  Result Value Ref Range   Lactic Acid, Venous 1.5 0.5 - 2.0 mmol/L  Lipase, blood     Status: Abnormal   Collection Time: 09/24/14  9:30 PM  Result Value Ref Range   Lipase 13 (L) 22 - 51 U/L  Troponin I (q 6hr x 3)     Status: None   Collection Time: 09/24/14  9:30 PM  Result Value Ref Range   Troponin I <0.03 <0.031 ng/mL     Comment:        NO INDICATION OF MYOCARDIAL INJURY.   C-reactive protein     Status: None   Collection Time: 09/24/14  9:30 PM  Result Value Ref Range   CRP 0.6 <1.0 mg/dL  Sedimentation rate     Status: Abnormal   Collection Time: 09/24/14  9:30 PM  Result Value Ref Range   Sed Rate 28 (H) 0 - 16 mm/hr  Reticulocytes     Status: Abnormal   Collection Time: 09/24/14  9:30 PM  Result Value Ref Range   Retic Ct Pct 1.6 0.4 - 3.1 %   RBC. 4.06 (L) 4.22 - 5.81 MIL/uL   Retic Count, Manual 65.0 19.0 - 186.0 K/uL  Lactic acid, plasma     Status: None   Collection Time: 09/24/14 10:20 PM  Result Value Ref Range   Lactic Acid, Venous 1.8 0.5 - 2.0 mmol/L  Troponin I (q 6hr x 3)     Status: None   Collection Time: 09/25/14  2:45 AM  Result Value Ref Range   Troponin I <0.03 <0.031 ng/mL    Comment:        NO INDICATION OF MYOCARDIAL INJURY.   Troponin I (q 6hr x 3)     Status: None   Collection Time: 09/25/14  7:55 AM  Result Value Ref Range   Troponin I <0.03 <0.031 ng/mL    Comment:        NO INDICATION OF MYOCARDIAL INJURY.   CBC with Differential/Platelet     Status: Abnormal   Collection Time: 09/25/14  7:55 AM  Result Value Ref Range   WBC 7.2 4.0 - 10.5 K/uL   RBC 4.54 4.22 - 5.81 MIL/uL   Hemoglobin 12.8 (L) 13.0 - 17.0 g/dL   HCT 37.8 (L) 39.0 - 52.0 %   MCV 83.3 78.0 - 100.0 fL   MCH 28.2 26.0 - 34.0 pg   MCHC 33.9 30.0 - 36.0 g/dL   RDW 15.0 11.5 - 15.5 %   Platelets 188 150 - 400 K/uL   Neutrophils Relative % 58 43 - 77 %   Neutro Abs 4.2  1.7 - 7.7 K/uL   Lymphocytes Relative 34 12 - 46 %   Lymphs Abs 2.4 0.7 - 4.0 K/uL   Monocytes Relative 5 3 - 12 %   Monocytes Absolute 0.4 0.1 - 1.0 K/uL   Eosinophils Relative 3 0 - 5 %   Eosinophils Absolute 0.2 0.0 - 0.7 K/uL   Basophils Relative 0 0 - 1 %   Basophils Absolute 0.0 0.0 - 0.1 K/uL  Comprehensive metabolic panel     Status: Abnormal   Collection Time: 09/25/14  7:55 AM  Result Value Ref Range    Sodium 139 135 - 145 mmol/L   Potassium 4.0 3.5 - 5.1 mmol/L   Chloride 104 101 - 111 mmol/L   CO2 25 22 - 32 mmol/L   Glucose, Bld 94 65 - 99 mg/dL   BUN <5 (L) 6 - 20 mg/dL   Creatinine, Ser 0.83 0.61 - 1.24 mg/dL   Calcium 8.7 (L) 8.9 - 10.3 mg/dL   Total Protein 6.5 6.5 - 8.1 g/dL   Albumin 3.4 (L) 3.5 - 5.0 g/dL   AST 20 15 - 41 U/L   ALT 17 17 - 63 U/L   Alkaline Phosphatase 78 38 - 126 U/L   Total Bilirubin 0.5 0.3 - 1.2 mg/dL   GFR calc non Af Amer >60 >60 mL/min   GFR calc Af Amer >60 >60 mL/min    Comment: (NOTE) The eGFR has been calculated using the CKD EPI equation. This calculation has not been validated in all clinical situations. eGFR's persistently <60 mL/min signify possible Chronic Kidney Disease.    Anion gap 10 5 - 15  Glucose, capillary     Status: None   Collection Time: 09/25/14 11:33 AM  Result Value Ref Range   Glucose-Capillary 85 65 - 99 mg/dL     Lipid Panel     Component Value Date/Time   CHOL  11/20/2008 0425    162        ATP III CLASSIFICATION:  <200     mg/dL   Desirable  200-239  mg/dL   Borderline High  >=240    mg/dL   High          TRIG 436* 11/20/2008 0425   HDL 25* 11/20/2008 0425   CHOLHDL 6.5 11/20/2008 0425   VLDL UNABLE TO CALCULATE IF TRIGLYCERIDE OVER 400 mg/dL 11/20/2008 0425   LDLCALC  11/20/2008 0425    UNABLE TO CALCULATE IF TRIGLYCERIDE OVER 400 mg/dL        Total Cholesterol/HDL:CHD Risk Coronary Heart Disease Risk Table                     Men   Women  1/2 Average Risk   3.4   3.3  Average Risk       5.0   4.4  2 X Average Risk   9.6   7.1  3 X Average Risk  23.4   11.0        Use the calculated Patient Ratio above and the CHD Risk Table to determine the patient's CHD Risk.        ATP III CLASSIFICATION (LDL):  <100     mg/dL   Optimal  100-129  mg/dL   Near or Above                    Optimal  130-159  mg/dL   Borderline  160-189  mg/dL   High  >190  mg/dL   Very High     Lab Results   Component Value Date   HGBA1C  11/19/2008    5.2 (NOTE) The ADA recommends the following therapeutic goal for glycemic control related to Hgb A1c measurement: Goal of therapy: <6.5 Hgb A1c  Reference: American Diabetes Association: Clinical Practice Recommendations 2010, Diabetes Care, 2010, 33: (Suppl  1).   HGBA1C  01/27/2008    5.3 (NOTE)   The ADA recommends the following therapeutic goal for glycemic   control related to Hgb A1C measurement:   Goal of Therapy:   < 7.0% Hgb A1C   Reference: American Diabetes Association: Clinical Practice   Recommendations 2008, Diabetes Care,  2008, 31:(Suppl 1).     Lab Results  Component Value Date   LDLCALC  11/20/2008    UNABLE TO CALCULATE IF TRIGLYCERIDE OVER 400 mg/dL        Total Cholesterol/HDL:CHD Risk Coronary Heart Disease Risk Table                     Men   Women  1/2 Average Risk   3.4   3.3  Average Risk       5.0   4.4  2 X Average Risk   9.6   7.1  3 X Average Risk  23.4   11.0        Use the calculated Patient Ratio above and the CHD Risk Table to determine the patient's CHD Risk.        ATP III CLASSIFICATION (LDL):  <100     mg/dL   Optimal  100-129  mg/dL   Near or Above                    Optimal  130-159  mg/dL   Borderline  160-189  mg/dL   High  >190     mg/dL   Very High   CREATININE 0.83 09/25/2014     HPI : Patient presents with chest pain. He appears to be very sleepy and is difficult to understand. History is limited secondary to this. The information I gather is that he's been having some pain in his central and left chest that started during the night last night. He's had some associated shortness of breath that started last night. He denies any cough or chest congestion. He does report some intermittent fevers. He recently had a cholecystectomy about 2 weeks ago and has some mild pain to his upper abdomen but he states it's unchanged from the pain these had post surgery. He denies any urinary symptoms.  He denies any alcohol use. He states he takes oxycodone for back pain but he denies taking any today. He is not oxygen requiring. He does have a history of COPD and bipolar disorder. Patient has been on 50 mg of prednisone at home that she was supposed to complete on 09/26/14. He also stated that his abdominal pain is chronic and was present prior to his gallbladder surgery.   HOSPITAL COURSE: COPD exacerbation-patient was already on steroids prior to admission, no wheezing upon admission Oxygen requirements unchanged and the patient is currently on room air. CT PE protocol showed no pulmonary embolism, it showed Nodular lesions are again noted in the left lower lobe, largest measuring 8 x 7 mm. Not these nodular opacity should be followed based on Fleischner Society guidelines. If the patient is at high risk for bronchogenic carcinoma, follow-up chest CT at 3-75months  No antibiotics are being  provided, pulse oximetry evaluation being done prior to discharge  Abdominal pain, very nonspecific and present for many many months, lipase and liver function negative, right upper quadrant ultrasound shows fatty liver, no other acute pathology, patient is already on chronic narcotics and no changes are being made in this regimen.   Chest pain, no evidence of PE, cardiology does not suspect unstable angina, his chest pain has atypical features, low risk EKG and cardiac enzymes. do not recommend any inpatient workup. Patient will discharge home today and follow-up with his outpatient cardiologist   Discharge Exam:    Blood pressure 132/82, pulse 81, temperature 97.9 F (36.6 C), temperature source Oral, resp. rate 18, height 6' (1.829 m), weight 116.075 kg (255 lb 14.4 oz), SpO2 98 %.  General: Alert, Awake and Oriented to Time, Place and Person. Appear in moderate distress Eyes: PERRL ENT: Oral Mucosa clear moist. Neck: present JVD Cardiovascular: S1 and S2 Present, no Murmur, Peripheral Pulses  Present Respiratory: Bilateral Air entry equal and Decreased,  Faint basal Crackles, bilateral expiratory wheezes Abdomen: Bowel Sound present, Soft and right-sided tender Skin: non Rash Extremities: no Pedal edema, no calf tenderness Neurologic: Grossly no focal neuro deficit.       Discharge Instructions    Diet - low sodium heart healthy    Complete by:  As directed      Increase activity slowly    Complete by:  As directed            Follow-up Information    Follow up with Dr.  Senaida Lange. Schedule an appointment as soon as possible for a visit in 3 days.   Contact information:   Patient's primary cardiologist      Follow up with PCP. Schedule an appointment as soon as possible for a visit in 3 days.      SignedReyne Dumas 09/25/2014, 11:43 AM        Time spent >45 mins

## 2014-09-25 NOTE — Evaluation (Signed)
Physical Therapy Evaluation Patient Details Name: [name] MRN: [id number] DOB: September 04, 1955 Today's Date: 09/25/2014   History of Present Illness  59 y.o. male admitted to [facility] on 09/24/14 with chest and abdominal pain.  Pt dx with COPD exacerbation.  Pt with significant PMHx of bipolar d/o, heart attack, asthma, HTN, hernia repair, and knee surgery.  Clinical Impression  Pt weak and deconditioned.  He could not walk further than 5 feet without feeling weak and lightheaded. VSS throughout session including O2 sats on RA in the mid to upper 90s despite DOE 2/4.  Orthostatic BPs tested and were negative.  Pt reports he really has not eaten in 4 days.  He is not safe to go home in current state of mobility and independence.  Recommend SNF placement for rehab before returning home alone.   PT to follow acutely for deficits listed below.       Follow Up Recommendations SNF;Supervision/Assistance - 24 hour    Equipment Recommendations  None recommended by PT    Recommendations for Other Services    NA   Precautions / Restrictions Precautions Precautions: Fall Precaution Comments: per pt h/o falls      Mobility  Bed Mobility Overal bed mobility: Modified Independent             General bed mobility comments: slow to move HOB elevated and relying on bed rail to get to sitting.   Transfers Overall transfer level: Needs assistance Equipment used: Rolling walker (2 wheeled) Transfers: Sit to/from Stand Sit to Stand: Min guard         General transfer comment: Min guard assist to help him transition to standing over weak legs.   Ambulation/Gait Ambulation/Gait assistance: Min assist Ambulation Distance (Feet): 5 Feet Assistive device: Rolling walker (2 wheeled) Gait Pattern/deviations: Step-through pattern;Shuffle Gait velocity: decreased Gait velocity interpretation: Below normal speed for age/gender General Gait Details: Pt with slow, shuffling gait pattern.  Reports  lightheadedness and weakness in standing, unable to walk further.  Chair pulled up behind him as his trunk was swaying (at his hips) and his knees were buckling bilaterally.  Eyes were open with no signs of them rolling back in his head.  O2 sats, HR were stable 2/4 DOE on RA.  Orthostatic BPs were checked and negative prior to attempt at gait.          Balance Overall balance assessment: Needs assistance Sitting-balance support: Feet supported;No upper extremity supported Sitting balance-Leahy Scale: Good     Standing balance support: Bilateral upper extremity supported Standing balance-Leahy Scale: Poor                               Pertinent Vitals/Pain Pain Assessment: 0-10 Pain Score: 6  Pain Location: abdomen and chest, generalized Pain Descriptors / Indicators: Burning;Constant Pain Intervention(s): Limited activity within patient's tolerance;Monitored during session;Repositioned    Home Living Family/patient expects to be discharged to:: Private residence Living Arrangements: Alone   Type of Home: House Home Access: Stairs to enter Entrance Stairs-Rails: None Entrance Stairs-Number of Steps: 5 Home Layout: One level Home Equipment: Environmental consultant - 2 wheels;Cane - quad      Prior Function Level of Independence: Independent         Comments: per pt, history of falls due to "getting dizzy and blacking out"        Extremity/Trunk Assessment   Upper Extremity Assessment: Generalized weakness  Lower Extremity Assessment: Generalized weakness      Cervical / Trunk Assessment: Normal (per pt h/o chronic low back pain, no surgery)  Communication   Communication: HOH  Cognition Arousal/Alertness: Awake/alert Behavior During Therapy: WFL for tasks assessed/performed Overall Cognitive Status: Within Functional Limits for tasks assessed                         Exercises General Exercises - Lower Extremity Long Arc Quad:  AROM;Both;10 reps;Seated Hip ABduction/ADduction: AROM;Both;10 reps;Seated Hip Flexion/Marching: AROM;Both;10 reps;Seated Toe Raises: AROM;Both;10 reps;Seated Heel Raises: AROM;Both;10 reps;Seated Other Exercises Other Exercises: HEP printout of above listed exercises given to pt      Assessment/Plan    PT Assessment Patient needs continued PT services  PT Diagnosis Difficulty walking;Abnormality of gait;Generalized weakness   PT Problem List Decreased strength;Decreased activity tolerance;Decreased mobility;Decreased balance;Decreased knowledge of use of DME;Obesity;Pain  PT Treatment Interventions DME instruction;Gait training;Stair training;Functional mobility training;Therapeutic activities;Therapeutic exercise;Balance training;Neuromuscular re-education;Patient/family education   PT Goals (Current goals can be found in the Care Plan section) Acute Rehab PT Goals Patient Stated Goal: to get stronger PT Goal Formulation: With patient Time For Goal Achievement: 10/09/14 Potential to Achieve Goals: Good    Frequency Min 3X/week   Barriers to discharge Decreased caregiver support pt has no assistance at discharge.        End of Session Equipment Utilized During Treatment: Gait belt Activity Tolerance: Patient limited by fatigue Patient left: in chair;with call bell/phone within reach;with chair alarm set Nurse Communication: Mobility status         Time: 1130-1206 PT Time Calculation (min) (ACUTE ONLY): 36 min   Charges:   PT Evaluation $Initial PT Evaluation Tier I: 1 Procedure PT Treatments $Therapeutic Activity: 8-22 mins        [name] B. [place], [place], DPT [phone]   09/25/2014, 12:26 PM

## 2014-09-25 NOTE — Clinical Social Work Note (Signed)
Clinical Social Work Assessment  Patient Details  Name: [name] MRN: [id number] Date of Birth: 1955/04/18  Date of referral:  09/25/14               Reason for consult:  Facility Placement                Permission sought to share information with:    Permission granted to share information::  No  Name::        Agency::     Relationship::     Contact Information:     Housing/Transportation Living arrangements for the past 2 months:  Single Family Home Source of Information:  Patient Patient Interpreter Needed:  None Criminal Activity/Legal Involvement Pertinent to Current Situation/Hospitalization:  No - Comment as needed Significant Relationships:  Friend, Neighbor Lives with:  Self Do you feel safe going back to the place where you live?  Yes Need for family participation in patient care:  No (Coment)  Care giving concerns:  Pt lives alone and ambulating very little and requiring assistance.    Social Worker assessment / plan:  CSW visited pt room to discuss recommendation. Pt seated in chair and open to speaking with CSW. Pt understanding of recommendation and of medical team concerns however he is hesitant to agree to SNF. CSW asked what was causing pt hesitation and he answered that he worries he will never be able to leave. CSW explained insurance authorization process and payment as well as pt right to leave facility as long as he has capacity to make this decision. Pt expressed understanding of this information but decided he would like to dc home. CSW asked if pt has friends/family that will be able to stay with pt at dc. Pt explained that he has a friend/neighbor that will be able to check-in on him. CSW asked if pt friend would be able to stay with him. Pt informed CSW that he would not be able to stay with him 24 hr but would potentially be possible for him to stay with pt at night and check in during the day. Pt is open to [place] with [facility] as he has used  them before. He is also open to home health social worker incase he changes his mind about SNF or has other resources needs once he is home. CSW notified RNCM, MD, and charge nurse of plan. Pt informed CSW he will need a taxi called for him at dc but he will be able to pay for this. Pt has no further hospital social work needs. CSW signing off.  Employment status:    Nurse, adult PT Recommendations:  [place] / Referral to community resources:  [place]  Patient/Family's Response to care:  Pt not agreeable to SNF and would like to dc home  Patient/Family's Understanding of and Emotional Response to Diagnosis, Current Treatment, and Prognosis:  CSW unsure of pt understanding of his condition. However, he was able to state understanding of medical team concerns for home safety. Pt with appropriate emotional response.  Emotional Assessment Appearance:  Appears stated age Attitude/Demeanor/Rapport:  Other (Cooperative) Affect (typically observed):  Calm, Pleasant Orientation:  Oriented to Self, Oriented to Place, Oriented to  Time, Oriented to Situation Alcohol / Substance use:  Not Applicable Psych involvement (Current and /or in the community):  No (Comment)  Discharge Needs  Concerns to be addressed:  Discharge Planning Concerns, Home Safety Concerns Readmission within the last 30  days:  No Current discharge risk:  Dependent with Mobility Barriers to Discharge:  No Barriers Identified   [place], [place]

## 2014-09-25 NOTE — Care Management Note (Signed)
Case Management Note Marvetta Gibbons RN, BSN Unit 2W-Case Manager [phone] Covering 3w  Patient Details  Name: Russel Morain MRN: [id number] Date of Birth: [birth year]  Subjective/Objective:   Pt admitted with COPD                 Action/Plan: PTA pt lived at home- per PT recommendation for SNF- CSW saw pt - declined SNF wants to return home with [facility]- has used Haematologist in past- spoke with pt and confirmed agency of choice as [facility]- referral called to [employer] with Haematologist for [facility] -PT/OT/aide/SW- CSW assisting with transportation - no further CM needed noted  Expected Discharge Date:      09/25/14            Expected Discharge Plan:  [place]  In-House Referral:  Clinical Social Work  Discharge planning Services  CM Consult  Post Acute Care Choice:    Choice offered to:  Patient  DME Arranged:    DME Agency:     HH Arranged:  PT, OT, Nurse's Aide [place] Agency:  [facility]  Status of Service:  Completed, signed off  Medicare Important Message Given:    NA- <3days Date Medicare IM Given:    Medicare IM give by:    Date Additional Medicare IM Given:    Additional Medicare Important Message give by:     If discussed at [place] of Stay Meetings, dates discussed:    Additional Comments:  Dawayne Patricia, RN 09/25/2014, 2:24 PM

## 2014-09-25 NOTE — Progress Notes (Signed)
Pt anxious to go home and called for taxi before echocardiogram could be performed. Educated pt to have follow-up with his cardiologist in 3 days and sent home with discharge packet with instructions and information about home health. IV's removed and belongings that were kept in safe were returned to pt. Pt was taken by wheelchair to meet taxi.

## 2014-09-25 NOTE — Progress Notes (Addendum)
Chest pain rounds Atypical symptoms for cardiac etiology of chest pain, low risk ECG and cardiac enzymes. Echo pending. At this point further cardiac assessment does not appear to be necessary, please reconsult if justified by change in clinical scenario or echocardiography findings. The patient has known coronary artery disease and has not had recent follow-up. Please arrange outpatient cardiology appointment at discharge.

## 2014-09-25 NOTE — Care Management Important Message (Signed)
Important Message  Patient Details  Name: [name] MRN: [id number] Date of Birth: 1955-07-01   Medicare Important Message Given:  N/A - LOS <3 / Initial given by admissions    Dawayne Patricia, RN 09/25/2014, 2:45 PM

## 2014-09-25 NOTE — Progress Notes (Signed)
Utilization review completed.  

## 2014-10-20 NOTE — Progress Notes (Signed)
Received call post discharge on 10/20/14 from [place] liaison regarding pt- [facility] has been attempted to find pt since discharge with several drive by attempts without success- and have made multiple phone call attempts and have been unable to reach pt to start [facility] services per conversation with [place] Evens on the last drive by- someone at the address that is listed in epic did answer the door only to tell [place] RN that no one by pt's name lived there nor did they know of anyone by that name- stating that they had lived there 10 years. Upon looking into pt's record in epic- the address in the current chart is the same one listed in one from 2006 and is the same one listed on pt's current copy of his driver's license which is valid until 2019. Unsure if pt just doesn't want [place] services or is staying elsewhere- [facility] at this time will close out the referral.

## 2014-10-23 ENCOUNTER — Emergency Department (HOSPITAL_BASED_OUTPATIENT_CLINIC_OR_DEPARTMENT_OTHER): Payer: Medicare HMO

## 2014-10-23 ENCOUNTER — Encounter (HOSPITAL_BASED_OUTPATIENT_CLINIC_OR_DEPARTMENT_OTHER): Payer: Self-pay | Admitting: Adult Health

## 2014-10-23 ENCOUNTER — Emergency Department (HOSPITAL_BASED_OUTPATIENT_CLINIC_OR_DEPARTMENT_OTHER)
Admission: EM | Admit: 2014-10-23 | Discharge: 2014-10-23 | Disposition: A | Discharge status: Home / Self Care | Payer: Medicare HMO | Attending: Emergency Medicine | Admitting: Emergency Medicine

## 2014-10-23 DIAGNOSIS — Z7982 Long term (current) use of aspirin: Secondary | ICD-10-CM | POA: Diagnosis not present

## 2014-10-23 DIAGNOSIS — I252 Old myocardial infarction: Secondary | ICD-10-CM | POA: Diagnosis not present

## 2014-10-23 DIAGNOSIS — R110 Nausea: Secondary | ICD-10-CM

## 2014-10-23 DIAGNOSIS — Z720 Tobacco use: Secondary | ICD-10-CM | POA: Insufficient documentation

## 2014-10-23 DIAGNOSIS — Z79899 Other long term (current) drug therapy: Secondary | ICD-10-CM | POA: Insufficient documentation

## 2014-10-23 DIAGNOSIS — Z9049 Acquired absence of other specified parts of digestive tract: Secondary | ICD-10-CM | POA: Diagnosis not present

## 2014-10-23 DIAGNOSIS — R1013 Epigastric pain: Secondary | ICD-10-CM | POA: Insufficient documentation

## 2014-10-23 DIAGNOSIS — I10 Essential (primary) hypertension: Secondary | ICD-10-CM | POA: Diagnosis not present

## 2014-10-23 DIAGNOSIS — E039 Hypothyroidism, unspecified: Secondary | ICD-10-CM | POA: Diagnosis not present

## 2014-10-23 DIAGNOSIS — F319 Bipolar disorder, unspecified: Secondary | ICD-10-CM | POA: Insufficient documentation

## 2014-10-23 DIAGNOSIS — R1010 Upper abdominal pain, unspecified: Secondary | ICD-10-CM | POA: Diagnosis not present

## 2014-10-23 DIAGNOSIS — Z9089 Acquired absence of other organs: Secondary | ICD-10-CM | POA: Insufficient documentation

## 2014-10-23 DIAGNOSIS — J441 Chronic obstructive pulmonary disease with (acute) exacerbation: Secondary | ICD-10-CM | POA: Diagnosis not present

## 2014-10-23 DIAGNOSIS — Z9861 Coronary angioplasty status: Secondary | ICD-10-CM | POA: Diagnosis not present

## 2014-10-23 DIAGNOSIS — R112 Nausea with vomiting, unspecified: Secondary | ICD-10-CM | POA: Diagnosis not present

## 2014-10-23 DIAGNOSIS — R109 Unspecified abdominal pain: Secondary | ICD-10-CM | POA: Diagnosis present

## 2014-10-23 LAB — COMPREHENSIVE METABOLIC PANEL
ALT: 15 U/L — ABNORMAL LOW (ref 17–63)
ANION GAP: 12 (ref 5–15)
AST: 26 U/L (ref 15–41)
Albumin: 3.9 g/dL (ref 3.5–5.0)
Alkaline Phosphatase: 77 U/L (ref 38–126)
BUN: 6 mg/dL (ref 6–20)
CHLORIDE: 103 mmol/L (ref 101–111)
CO2: 25 mmol/L (ref 22–32)
Calcium: 8.9 mg/dL (ref 8.9–10.3)
Creatinine, Ser: 0.8 mg/dL (ref 0.61–1.24)
GFR calc Af Amer: >60 mL/min (ref >60)
Glucose, Bld: 141 mg/dL — ABNORMAL HIGH (ref 65–99)
POTASSIUM: 3.2 mmol/L — AB (ref 3.5–5.1)
Sodium: 140 mmol/L (ref 135–145)
Total Bilirubin: 0.5 mg/dL (ref 0.3–1.2)
Total Protein: 7 g/dL (ref 6.5–8.1)

## 2014-10-23 LAB — CBC WITH DIFFERENTIAL/PLATELET
BASOS PCT: 0 % (ref 0–1)
Basophils Absolute: 0 10*3/uL (ref 0.0–0.1)
EOS PCT: 2 % (ref 0–5)
Eosinophils Absolute: 0.2 10*3/uL (ref 0.0–0.7)
HCT: 37.6 % — ABNORMAL LOW (ref 39.0–52.0)
Hemoglobin: 12.9 g/dL — ABNORMAL LOW (ref 13.0–17.0)
Lymphocytes Relative: 24 % (ref 12–46)
Lymphs Abs: 2.2 10*3/uL (ref 0.7–4.0)
MCH: 28.9 pg (ref 26.0–34.0)
MCHC: 34.3 g/dL (ref 30.0–36.0)
MCV: 84.3 fL (ref 78.0–100.0)
MONO ABS: 0.5 10*3/uL (ref 0.1–1.0)
Monocytes Relative: 5 % (ref 3–12)
Neutro Abs: 6.3 10*3/uL (ref 1.7–7.7)
Neutrophils Relative %: 69 % (ref 43–77)
PLATELETS: 191 10*3/uL (ref 150–400)
RBC: 4.46 MIL/uL (ref 4.22–5.81)
RDW: 14.9 % (ref 11.5–15.5)
WBC: 9.2 10*3/uL (ref 4.0–10.5)

## 2014-10-23 LAB — I-STAT CG4 LACTIC ACID, ED
LACTIC ACID, VENOUS: 3.14 mmol/L — AB (ref 0.5–2.0)
LACTIC ACID, VENOUS: 2.24 mmol/L — AB (ref 0.5–2.0)

## 2014-10-23 LAB — LIPASE, BLOOD: LIPASE: 13 U/L — AB (ref 22–51)

## 2014-10-23 LAB — URINALYSIS, ROUTINE W REFLEX MICROSCOPIC
Bilirubin Urine: NEGATIVE
Glucose, UA: NEGATIVE mg/dL
Hgb urine dipstick: NEGATIVE
Ketones, ur: NEGATIVE mg/dL
Leukocytes, UA: NEGATIVE
NITRITE: NEGATIVE
PH: 6.5 (ref 5.0–8.0)
Protein, ur: NEGATIVE mg/dL
Specific Gravity, Urine: 1.011 (ref 1.005–1.030)
UROBILINOGEN UA: 1 mg/dL (ref 0.0–1.0)

## 2014-10-23 MED ORDER — PREDNISONE 20 MG PO TABS: 40.0000 mg | ORAL_TABLET | Freq: Every day | ORAL | Status: DC

## 2014-10-23 MED ORDER — PROMETHAZINE HCL 25 MG/ML IJ SOLN
12.5000 mg | Freq: Once | INTRAMUSCULAR | Status: AC
  Administered 2014-10-23: 12.5 mg via INTRAVENOUS
  Filled 2014-10-23: qty 1

## 2014-10-23 MED ORDER — PROMETHAZINE HCL 25 MG/ML IJ SOLN
25.0000 mg | Freq: Once | INTRAMUSCULAR | Status: AC
  Administered 2014-10-23: 25 mg via INTRAVENOUS
  Filled 2014-10-23: qty 1

## 2014-10-23 MED ORDER — IPRATROPIUM-ALBUTEROL 0.5-2.5 (3) MG/3ML IN SOLN
3.0000 mL | Freq: Four times a day (QID) | RESPIRATORY_TRACT | Status: DC
  Filled 2014-10-23 (×2): qty 3

## 2014-10-23 MED ORDER — SODIUM CHLORIDE 0.9 % IV BOLUS (SEPSIS)
500.0000 mL | Freq: Once | INTRAVENOUS | Status: AC
  Administered 2014-10-23: 500 mL via INTRAVENOUS

## 2014-10-23 MED ORDER — MORPHINE SULFATE (PF) 4 MG/ML IV SOLN
4.0000 mg | Freq: Once | INTRAVENOUS | Status: AC
  Administered 2014-10-23: 4 mg via INTRAVENOUS
  Filled 2014-10-23: qty 1

## 2014-10-23 MED ORDER — ONDANSETRON HCL 4 MG/2ML IJ SOLN
4.0000 mg | Freq: Once | INTRAMUSCULAR | Status: AC
  Administered 2014-10-23: 4 mg via INTRAVENOUS
  Filled 2014-10-23: qty 2

## 2014-10-23 MED ORDER — DOXYCYCLINE HYCLATE 100 MG PO CAPS: 100.0000 mg | ORAL_CAPSULE | Freq: Two times a day (BID) | ORAL | Status: DC

## 2014-10-23 MED ORDER — OXYCODONE-ACETAMINOPHEN 5-325 MG PO TABS
1.0000 | ORAL_TABLET | Freq: Once | ORAL | Status: AC
Start: 2014-10-23 — End: 2014-10-23
  Administered 2014-10-23: 1 via ORAL
  Filled 2014-10-23: qty 1

## 2014-10-23 MED ORDER — PREDNISONE 10 MG PO TABS
60.0000 mg | ORAL_TABLET | Freq: Once | ORAL | Status: AC
  Administered 2014-10-23: 60 mg via ORAL
  Filled 2014-10-23 (×2): qty 1

## 2014-10-23 MED ORDER — SODIUM CHLORIDE 0.9 % IV BOLUS (SEPSIS)
1000.0000 mL | Freq: Once | INTRAVENOUS | Status: AC
  Administered 2014-10-23: 1000 mL via INTRAVENOUS

## 2014-10-23 MED ORDER — PROMETHAZINE HCL 25 MG PO TABS: 25.0000 mg | ORAL_TABLET | Freq: Four times a day (QID) | ORAL | Status: DC | PRN

## 2014-10-23 NOTE — ED Notes (Signed)
Presents with 3 days of dull abdominal pain associated with nausea and vomiting-endorses 6 episodes of emesis-denies diarrhea. Endorses fevers of 102.0 at home-unable to hold down fluids.

## 2014-10-23 NOTE — ED Provider Notes (Signed)
CSN: [id number]     Arrival date & time 10/23/14  0429 History   First MD Initiated Contact with Patient 10/23/14 [phone]     Chief Complaint  Patient presents with  . Abdominal Pain     (Consider location/radiation/quality/duration/timing/severity/associated sxs/prior Treatment) HPI  This is a 59 year old male with a history of COPD, coronary artery disease, hypertension who presents with a 2-3 day history of dull abdominal pain. Patient reports that the pain is in the midline and nonradiating. It is dull. Currently 8 out of 10. He has associated nausea and vomiting. Denies any diarrhea. Last normal bowel movement was proximal to 4 days ago. States that he had a fever to 101 at home last night. He reports that he is unable to take his medications at home secondary to vomiting. Reports that this pain is different than his prior pain following his cholecystectomy. Denies any dysuria or hematuria. Denies any chest pain. Baseline shortness of breath with severe COPD.  Past Medical History  Diagnosis Date  . COPD (chronic obstructive pulmonary disease)   . Bipolar 1 disorder   . Heart attack   . Additional heart attack   . Hypothyroidism   . Asthma   . Hypertension    Past Surgical History  Procedure Laterality Date  . Coronary angioplasty with stent placement    . Appendectomy    . Tonsillectomy    . Hernia repair    . Knee surgery    . Cholecystectomy     History reviewed. No pertinent family history. Social History  Substance Use Topics  . Smoking status: Current Every Day Smoker  . Smokeless tobacco: Never Used  . Alcohol Use: No    Review of Systems  Constitutional: Negative.  Negative for fever.  Respiratory: Positive for shortness of breath and wheezing. Negative for chest tightness.   Cardiovascular: Negative.  Negative for chest pain.  Gastrointestinal: Positive for nausea, vomiting and abdominal pain. Negative for diarrhea.  Genitourinary: Negative.  Negative for  dysuria and hematuria.  Musculoskeletal: Negative for back pain.  Neurological: Negative for headaches.  All other systems reviewed and are negative.     Allergies  Rocephin; Prochlorperazine edisylate; Toradol; and Tramadol  Home Medications   Prior to Admission medications   Medication Sig Start Date End Date Taking? Authorizing Provider  acetaminophen (TYLENOL) 500 MG chewable tablet Chew 1,000 mg by mouth every 6 (six) hours as needed. For pain    Historical Provider, MD  aspirin 325 MG tablet Take 325 mg by mouth daily.    Historical Provider, MD  cyclobenzaprine (FLEXERIL) 5 MG tablet Take 1 tablet (5 mg total) by mouth every 6 (six) hours as needed (for back pain). 09/25/14   Reyne Dumas, MD  docusate sodium (STOOL SOFTENER) 100 MG capsule Take 100 mg by mouth 2 (two) times daily.    Historical Provider, MD  fluticasone-salmeterol (ADVAIR HFA) 45-21 MCG/ACT inhaler Inhale 1 puff into the lungs 2 (two) times daily.     Historical Provider, MD  hydrOXYzine (VISTARIL) 25 MG capsule Take 50 mg by mouth at bedtime as needed. 09/02/14   Historical Provider, MD  levothyroxine (SYNTHROID, LEVOTHROID) 25 MCG tablet Take 25 mcg by mouth daily.    Historical Provider, MD  lisinopril (PRINIVIL,ZESTRIL) 10 MG tablet Take 10 mg by mouth daily. 07/02/14 07/02/15  Historical Provider, MD  Multiple Vitamin (MULTI-VITAMINS) TABS Take 1 tablet by mouth daily.    Historical Provider, MD  nitroGLYCERIN (NITROSTAT) 0.4 MG SL tablet Place 0.4  mg under the tongue every 5 (five) minutes as needed for chest pain.     Historical Provider, MD  omeprazole (PRILOSEC) 20 MG capsule Take 20 mg by mouth 2 (two) times daily. 09/19/14   Historical Provider, MD  Oxycodone HCl 10 MG TABS Take 10 mg by mouth every 4 (four) hours as needed (for pain).  09/10/14   Historical Provider, MD  polyethylene glycol (MIRALAX / GLYCOLAX) packet Take 17 g by mouth daily as needed for mild constipation.     Historical Provider, MD   promethazine (PHENERGAN) 25 MG suppository Place 25 mg rectally every 6 (six) hours as needed. 09/22/14 09/29/14  Historical Provider, MD  promethazine (PHENERGAN) 25 MG tablet Take 1 tablet (25 mg total) by mouth every 6 (six) hours as needed for nausea. 09/25/14   Reyne Dumas, MD  QUEtiapine (SEROQUEL) 100 MG tablet Take 100 mg by mouth 2 (two) times daily. 09/12/14   Historical Provider, MD  simvastatin (ZOCOR) 20 MG tablet Take 20 mg by mouth every evening.    Historical Provider, MD  temazepam (RESTORIL) 30 MG capsule Take 30 mg by mouth daily. 09/23/14   Historical Provider, MD  traZODone (DESYREL) 100 MG tablet Take 200 mg by mouth at bedtime.    Historical Provider, MD  venlafaxine (EFFEXOR) 100 MG tablet Take 300 mg by mouth daily.    Historical Provider, MD   BP 148/94 mmHg  Pulse 89  Temp(Src) 98.3 F (36.8 C) (Oral)  Resp 26  Ht 6' (1.829 m)  Wt 252 lb (114.306 kg)  BMI 34.17 kg/m2  SpO2 100% Physical Exam  Constitutional: He is oriented to person, place, and time. No distress.  Elderly, chronically ill-appearing  HENT:  Head: Normocephalic and atraumatic.  Mucous membranes dry  Eyes: Pupils are equal, round, and reactive to light.  Neck: Neck supple.  Cardiovascular: Normal rate, regular rhythm and normal heart sounds.   No murmur heard. Pulmonary/Chest: Effort normal. No respiratory distress. He has wheezes.  Mild tachypnea  Abdominal: Soft. Bowel sounds are normal. There is tenderness. There is no rebound and no guarding.  Mild epigastric tenderness to palpation without rebound or guarding  Musculoskeletal: He exhibits no edema.  Neurological: He is alert and oriented to person, place, and time.  Skin: Skin is warm and dry.  Psychiatric: He has a normal mood and affect.  Nursing note and vitals reviewed.   ED Course  Procedures (including critical care time) Labs Review Labs Reviewed  CBC WITH DIFFERENTIAL/PLATELET - Abnormal; Notable for the following:     Hemoglobin 12.9 (*)    HCT 37.6 (*)    All other components within normal limits  COMPREHENSIVE METABOLIC PANEL - Abnormal; Notable for the following:    Potassium 3.2 (*)    Glucose, Bld 141 (*)    ALT 15 (*)    All other components within normal limits  LIPASE, BLOOD - Abnormal; Notable for the following:    Lipase 13 (*)    All other components within normal limits  I-STAT CG4 LACTIC ACID, ED - Abnormal; Notable for the following:    Lactic Acid, Venous 3.14 (*)    All other components within normal limits  URINALYSIS, ROUTINE W REFLEX MICROSCOPIC (NOT AT [facility])  I-STAT CG4 LACTIC ACID, ED    Imaging Review Dg Abd Acute W/chest  10/23/2014   CLINICAL DATA:  Abdominal pain, nausea and fever.  EXAM: DG ABDOMEN ACUTE W/ 1V CHEST  COMPARISON:  CT 10/21/2014.  FINDINGS: There  is no evidence of dilated bowel loops or free intraperitoneal air. No biliary or urinary calculi are evident. Heart size and mediastinal contours are within normal limits. Both lungs are clear except for a 4 mm nodular opacity in the lateral left lung which is unchanged from 07/17/2012 and benign.  IMPRESSION: Negative abdominal radiographs.  No acute cardiopulmonary disease.   Electronically Signed   By: Andreas Newport M.D.   On: 10/23/2014 05:44   I have personally reviewed and evaluated these images and lab results as part of my medical decision-making.   EKG Interpretation None      MDM   Final diagnoses:  None    Patient presents with abdominal pain and vomiting. Has had several presentations since his cholecystectomy with similar symptoms. I reviewed the patient's chart and it appears he was seen approximately 36 hours ago at [facility]. At that time he had a CT scan that was reassuring. He was discharged home. When I asked the patient, patient states that that the symptoms persisted. Basic labwork obtained. Lactate is mildly elevated at 3.14. Otherwise lab work is reassuring. Patient was given  fluids and repeat lactate obtained.  Repeat lactate after 1 L of fluids down trending to 2.2. Patient was ordered an additional 500 mL of fluid. Of note, she was noted to be wheezing. He was in no respiratory distress. States that he feels a little more short of breath than his normal. Chest x-ray shows no pneumonia. Patient was given a duo neb and prednisone. Suspect mild COPD exacerbation. This is similar to his prior presentation here when he was admitted with abdominal pain and COPD exacerbation in early August.  On recheck, he reports improvement of his shortness of breath with duo neb. He continues to endorse nausea but has not produced any emesis and tolerated oral steroidal and oral pain medication. He has pain medication at home. He does not have anything for nausea. Will discharge with Phenergan. Was given information for GI follow-up. Was given strict return precautions.  After history, exam, and medical workup I feel the patient has been appropriately medically screened and is safe for discharge home. Pertinent diagnoses were discussed with the patient. Patient was given return precautions.     Merryl Hacker, MD 10/23/14 [phone]

## 2014-10-23 NOTE — ED Notes (Signed)
Pt attempting to find ride home due to narcotics administration. Will hold PO percocet and d/c instructions until driver arrives. Pt in agreement with plan.

## 2014-10-23 NOTE — Discharge Instructions (Signed)
You were seen today for abdominal pain. The cause of your pain at this time is unknown. He had a CT scan less than 2 days ago that was reassuring. Given the chronicity of your pain, you need to see a GI specialist. You will be given nausea medication.  You were also noted to be wheezing. He has a history of severe COPD. You will given a course of steroids and antibiotics.  Abdominal Pain Many things can cause abdominal pain. Usually, abdominal pain is not caused by a disease and will improve without treatment. It can often be observed and treated at home. Your health care provider will do a physical exam and possibly order blood tests and X-rays to help determine the seriousness of your pain. However, in many cases, more time must pass before a clear cause of the pain can be found. Before that point, your health care provider may not know if you need more testing or further treatment. HOME CARE INSTRUCTIONS  Monitor your abdominal pain for any changes. The following actions may help to alleviate any discomfort you are experiencing:  Only take over-the-counter or prescription medicines as directed by your health care provider.  Do not take laxatives unless directed to do so by your health care provider.  Try a clear liquid diet (broth, tea, or water) as directed by your health care provider. Slowly move to a bland diet as tolerated. SEEK MEDICAL CARE IF:  You have unexplained abdominal pain.  You have abdominal pain associated with nausea or diarrhea.  You have pain when you urinate or have a bowel movement.  You experience abdominal pain that wakes you in the night.  You have abdominal pain that is worsened or improved by eating food.  You have abdominal pain that is worsened with eating fatty foods.  You have a fever. SEEK IMMEDIATE MEDICAL CARE IF:   Your pain does not go away within 2 hours.  You keep throwing up (vomiting).  Your pain is felt only in portions of the abdomen, such  as the right side or the left lower portion of the abdomen.  You pass bloody or black tarry stools. MAKE SURE YOU:  Understand these instructions.   Will watch your condition.   Will get help right away if you are not doing well or get worse.  Document Released: 11/17/2004 Document Revised: 02/12/2013 Document Reviewed: 10/17/2012 [facility] Patient Information 2015 [place], [place]. This information is not intended to replace advice given to you by your health care provider. Make sure you discuss any questions you have with your health care provider. Chronic Obstructive Pulmonary Disease Chronic obstructive pulmonary disease (COPD) is a common lung condition in which airflow from the lungs is limited. COPD is a general term that can be used to describe many different lung problems that limit airflow, including both chronic bronchitis and emphysema. If you have COPD, your lung function will probably never return to normal, but there are measures you can take to improve lung function and make yourself feel better.  CAUSES   Smoking (common).   Exposure to secondhand smoke.   Genetic problems.  Chronic inflammatory lung diseases or recurrent infections. SYMPTOMS   Shortness of breath, especially with physical activity.   Deep, persistent (chronic) cough with a large amount of thick mucus.   Wheezing.   Rapid breaths (tachypnea).   Gray or bluish discoloration (cyanosis) of the skin, especially in fingers, toes, or lips.   Fatigue.   Weight loss.   Frequent  infections or episodes when breathing symptoms become much worse (exacerbations).   Chest tightness. DIAGNOSIS  Your health care provider will take a medical history and perform a physical examination to make the initial diagnosis. Additional tests for COPD may include:   Lung (pulmonary) function tests.  Chest X-ray.  CT scan.  Blood tests. TREATMENT  Treatment available to help you feel better when  you have COPD includes:   Inhaler and nebulizer medicines. These help manage the symptoms of COPD and make your breathing more comfortable.  Supplemental oxygen. Supplemental oxygen is only helpful if you have a low oxygen level in your blood.   Exercise and physical activity. These are beneficial for nearly all people with COPD. Some people may also benefit from a pulmonary rehabilitation program. HOME CARE INSTRUCTIONS   Take all medicines (inhaled or pills) as directed by your health care provider.  Avoid over-the-counter medicines or cough syrups that dry up your airway (such as antihistamines) and slow down the elimination of secretions unless instructed otherwise by your health care provider.   If you are a smoker, the most important thing that you can do is stop smoking. Continuing to smoke will cause further lung damage and breathing trouble. Ask your health care provider for help with quitting smoking. He or she can direct you to community resources or hospitals that provide support.  Avoid exposure to irritants such as smoke, chemicals, and fumes that aggravate your breathing.  Use oxygen therapy and pulmonary rehabilitation if directed by your health care provider. If you require home oxygen therapy, ask your health care provider whether you should purchase a pulse oximeter to measure your oxygen level at home.   Avoid contact with individuals who have a contagious illness.  Avoid extreme temperature and humidity changes.  Eat healthy foods. Eating smaller, more frequent meals and resting before meals may help you maintain your strength.  Stay active, but balance activity with periods of rest. Exercise and physical activity will help you maintain your ability to do things you want to do.  Preventing infection and hospitalization is very important when you have COPD. Make sure to receive all the vaccines your health care provider recommends, especially the pneumococcal and  influenza vaccines. Ask your health care provider whether you need a pneumonia vaccine.  Learn and use relaxation techniques to manage stress.  Learn and use controlled breathing techniques as directed by your health care provider. Controlled breathing techniques include:   Pursed lip breathing. Start by breathing in (inhaling) through your nose for 1 second. Then, purse your lips as if you were going to whistle and breathe out (exhale) through the pursed lips for 2 seconds.   Diaphragmatic breathing. Start by putting one hand on your abdomen just above your waist. Inhale slowly through your nose. The hand on your abdomen should move out. Then purse your lips and exhale slowly. You should be able to feel the hand on your abdomen moving in as you exhale.   Learn and use controlled coughing to clear mucus from your lungs. Controlled coughing is a series of short, progressive coughs. The steps of controlled coughing are:  1. Lean your head slightly forward.  2. Breathe in deeply using diaphragmatic breathing.  3. Try to hold your breath for 3 seconds.  4. Keep your mouth slightly open while coughing twice.  5. Spit any mucus out into a tissue.  6. Rest and repeat the steps once or twice as needed. SEEK MEDICAL CARE IF:  You are coughing up more mucus than usual.   There is a change in the color or thickness of your mucus.   Your breathing is more labored than usual.   Your breathing is faster than usual.  SEEK IMMEDIATE MEDICAL CARE IF:   You have shortness of breath while you are resting.   You have shortness of breath that prevents you from:  Being able to talk.   Performing your usual physical activities.   You have chest pain lasting longer than 5 minutes.   Your skin color is more cyanotic than usual.  You measure low oxygen saturations for longer than 5 minutes with a pulse oximeter. MAKE SURE YOU:   Understand these instructions.  Will watch your  condition.  Will get help right away if you are not doing well or get worse. Document Released: 11/17/2004 Document Revised: 06/24/2013 Document Reviewed: 10/04/2012 [facility] Patient Information 2015 [place], [place]. This information is not intended to replace advice given to you by your health care provider. Make sure you discuss any questions you have with your health care provider.

## 2014-12-17 ENCOUNTER — Emergency Department (HOSPITAL_BASED_OUTPATIENT_CLINIC_OR_DEPARTMENT_OTHER)
Admission: EM | Admit: 2014-12-17 | Discharge: 2014-12-18 | Disposition: A | Discharge status: Home / Self Care | Payer: Medicare HMO | Attending: Emergency Medicine | Admitting: Emergency Medicine

## 2014-12-17 ENCOUNTER — Emergency Department (HOSPITAL_BASED_OUTPATIENT_CLINIC_OR_DEPARTMENT_OTHER): Payer: Medicare HMO

## 2014-12-17 ENCOUNTER — Encounter (HOSPITAL_BASED_OUTPATIENT_CLINIC_OR_DEPARTMENT_OTHER): Payer: Self-pay

## 2014-12-17 DIAGNOSIS — F319 Bipolar disorder, unspecified: Secondary | ICD-10-CM | POA: Insufficient documentation

## 2014-12-17 DIAGNOSIS — Z720 Tobacco use: Secondary | ICD-10-CM | POA: Diagnosis not present

## 2014-12-17 DIAGNOSIS — E039 Hypothyroidism, unspecified: Secondary | ICD-10-CM | POA: Insufficient documentation

## 2014-12-17 DIAGNOSIS — Z7982 Long term (current) use of aspirin: Secondary | ICD-10-CM | POA: Diagnosis not present

## 2014-12-17 DIAGNOSIS — Z9861 Coronary angioplasty status: Secondary | ICD-10-CM | POA: Insufficient documentation

## 2014-12-17 DIAGNOSIS — R39198 Other difficulties with micturition: Secondary | ICD-10-CM | POA: Diagnosis not present

## 2014-12-17 DIAGNOSIS — I10 Essential (primary) hypertension: Secondary | ICD-10-CM | POA: Diagnosis not present

## 2014-12-17 DIAGNOSIS — Z79899 Other long term (current) drug therapy: Secondary | ICD-10-CM | POA: Insufficient documentation

## 2014-12-17 DIAGNOSIS — R350 Frequency of micturition: Secondary | ICD-10-CM | POA: Insufficient documentation

## 2014-12-17 DIAGNOSIS — J441 Chronic obstructive pulmonary disease with (acute) exacerbation: Secondary | ICD-10-CM | POA: Insufficient documentation

## 2014-12-17 DIAGNOSIS — R112 Nausea with vomiting, unspecified: Secondary | ICD-10-CM | POA: Diagnosis not present

## 2014-12-17 DIAGNOSIS — R0782 Intercostal pain: Secondary | ICD-10-CM | POA: Diagnosis not present

## 2014-12-17 DIAGNOSIS — R61 Generalized hyperhidrosis: Secondary | ICD-10-CM | POA: Insufficient documentation

## 2014-12-17 DIAGNOSIS — R079 Chest pain, unspecified: Secondary | ICD-10-CM | POA: Diagnosis present

## 2014-12-17 DIAGNOSIS — Z8674 Personal history of sudden cardiac arrest: Secondary | ICD-10-CM | POA: Insufficient documentation

## 2014-12-17 DIAGNOSIS — Z792 Long term (current) use of antibiotics: Secondary | ICD-10-CM | POA: Insufficient documentation

## 2014-12-17 DIAGNOSIS — M7981 Nontraumatic hematoma of soft tissue: Secondary | ICD-10-CM | POA: Diagnosis not present

## 2014-12-17 LAB — CBC
HCT: 35.9 % — ABNORMAL LOW (ref 39.0–52.0)
Hemoglobin: 12.6 g/dL — ABNORMAL LOW (ref 13.0–17.0)
MCH: 29.4 pg (ref 26.0–34.0)
MCHC: 35.1 g/dL (ref 30.0–36.0)
MCV: 83.9 fL (ref 78.0–100.0)
PLATELETS: 253 10*3/uL (ref 150–400)
RBC: 4.28 MIL/uL (ref 4.22–5.81)
RDW: 14.2 % (ref 11.5–15.5)
WBC: 17 10*3/uL — AB (ref 4.0–10.5)

## 2014-12-17 LAB — HEPATIC FUNCTION PANEL
ALBUMIN: 4 g/dL (ref 3.5–5.0)
ALT: 18 U/L (ref 17–63)
AST: 30 U/L (ref 15–41)
Alkaline Phosphatase: 79 U/L (ref 38–126)
BILIRUBIN INDIRECT: 0.4 mg/dL (ref 0.3–0.9)
Bilirubin, Direct: 0.1 mg/dL (ref 0.1–0.5)
TOTAL PROTEIN: 7.2 g/dL (ref 6.5–8.1)
Total Bilirubin: 0.5 mg/dL (ref 0.3–1.2)

## 2014-12-17 LAB — BASIC METABOLIC PANEL
Anion gap: 11 (ref 5–15)
BUN: 6 mg/dL (ref 6–20)
CALCIUM: 8.7 mg/dL — AB (ref 8.9–10.3)
CO2: 21 mmol/L — ABNORMAL LOW (ref 22–32)
CREATININE: 0.65 mg/dL (ref 0.61–1.24)
Chloride: 98 mmol/L — ABNORMAL LOW (ref 101–111)
GFR calc Af Amer: >60 mL/min (ref >60)
GLUCOSE: 149 mg/dL — AB (ref 65–99)
POTASSIUM: 3 mmol/L — AB (ref 3.5–5.1)
SODIUM: 130 mmol/L — AB (ref 135–145)

## 2014-12-17 LAB — TROPONIN I

## 2014-12-17 LAB — LIPASE, BLOOD: LIPASE: 15 U/L (ref 11–51)

## 2014-12-17 MED ORDER — FAMOTIDINE 20 MG PO TABS: 20.0000 mg | ORAL_TABLET | Freq: Two times a day (BID) | ORAL | Status: DC

## 2014-12-17 MED ORDER — IPRATROPIUM-ALBUTEROL 0.5-2.5 (3) MG/3ML IN SOLN
3.0000 mL | Freq: Four times a day (QID) | RESPIRATORY_TRACT | Status: DC
  Administered 2014-12-17: 3 mL via RESPIRATORY_TRACT
  Filled 2014-12-17: qty 3

## 2014-12-17 MED ORDER — ONDANSETRON 4 MG PO TBDP: ORAL_TABLET | ORAL | Status: DC

## 2014-12-17 MED ORDER — PANTOPRAZOLE SODIUM 40 MG IV SOLR
40.0000 mg | Freq: Once | INTRAVENOUS | Status: AC
  Administered 2014-12-17: 40 mg via INTRAVENOUS
  Filled 2014-12-17: qty 40

## 2014-12-17 MED ORDER — ONDANSETRON HCL 4 MG/2ML IJ SOLN
4.0000 mg | Freq: Once | INTRAMUSCULAR | Status: AC
  Administered 2014-12-17: 4 mg via INTRAVENOUS
  Filled 2014-12-17: qty 2

## 2014-12-17 NOTE — ED Notes (Signed)
Pt c/o cp, sob, n/v x 3 hours  States pain is radiating to jaw

## 2014-12-17 NOTE — ED Provider Notes (Addendum)
CSN: [id number]     Arrival date & time 12/17/14  2042 History  By signing my name below, I, Terrance Branch, attest that this documentation has been prepared under the direction and in the presence of Charlesetta Shanks, MD. Electronically Signed: Randa Evens, ED Scribe. 12/17/2014. 11:52 PM.     Chief Complaint  Patient presents with  . Chest Pain  . Shortness of Breath   Patient is a 59 y.o. male presenting with chest pain and shortness of breath. The history is provided by the patient. No language interpreter was used.  Chest Pain Associated symptoms: diaphoresis, nausea, shortness of breath and vomiting   Associated symptoms: no fever   Shortness of Breath Associated symptoms: chest pain, diaphoresis and vomiting   Associated symptoms: no fever    HPI Comments: [name] is a 59 y.o. male who presents to the Emergency Department complaining of CP onset today at 6 PM. The patient symptoms started very abruptly with vomiting. He reports this is something that has occurred sporadically since his gallbladder removal. He reports he threw up 5 times fairly close together and then after that developed pain in his left lower chest. He can't think of anything he ate that triggered the symptoms. Pt describes the pain as a pressure that increases to a sharp pain when deep breathing Pt states that he his symptoms began with nausea and vomiting x5. Pt also reports SOB, diaphoresis. Pt states that the pain is worse with deep breathing. Pt states that he has an everyday smoker. Denies fever or cough. Pt states that he has a Hx of 2 MI's and DVT. Pt also reports having difficulty urinating and frequency at night.   Past Medical History  Diagnosis Date  . COPD (chronic obstructive pulmonary disease) ([place])   . Bipolar 1 disorder ([place])   . Heart attack ([place])   . Additional heart attack   . Hypothyroidism   . Asthma   . Hypertension    Past Surgical History  Procedure Laterality Date  .  Coronary angioplasty with stent placement    . Appendectomy    . Tonsillectomy    . Hernia repair    . Knee surgery    . Cholecystectomy     No family history on file. Social History  Substance Use Topics  . Smoking status: Current Every Day Smoker  . Smokeless tobacco: Never Used  . Alcohol Use: No    Review of Systems  Constitutional: Positive for diaphoresis. Negative for fever.  Respiratory: Positive for shortness of breath.   Cardiovascular: Positive for chest pain.  Gastrointestinal: Positive for nausea and vomiting.  Genitourinary: Positive for frequency and difficulty urinating. Negative for dysuria.  All other systems reviewed and are negative.    Allergies  Rocephin; Prochlorperazine edisylate; Toradol; and Tramadol  Home Medications   Prior to Admission medications   Medication Sig Start Date End Date Taking? Authorizing Provider  acetaminophen (TYLENOL) 500 MG chewable tablet Chew 1,000 mg by mouth every 6 (six) hours as needed. For pain    Historical Provider, MD  aspirin 325 MG tablet Take 325 mg by mouth daily.    Historical Provider, MD  cyclobenzaprine (FLEXERIL) 5 MG tablet Take 1 tablet (5 mg total) by mouth every 6 (six) hours as needed (for back pain). 09/25/14   Reyne Dumas, MD  docusate sodium (STOOL SOFTENER) 100 MG capsule Take 100 mg by mouth 2 (two) times daily.    Historical Provider, MD  doxycycline (VIBRAMYCIN) 100 MG  capsule Take 1 capsule (100 mg total) by mouth 2 (two) times daily. 10/23/14   Merryl Hacker, MD  famotidine (PEPCID) 20 MG tablet Take 1 tablet (20 mg total) by mouth 2 (two) times daily. 12/17/14   Charlesetta Shanks, MD  fluticasone-salmeterol (ADVAIR HFA) [id number] MCG/ACT inhaler Inhale 1 puff into the lungs 2 (two) times daily.     Historical Provider, MD  hydrOXYzine (VISTARIL) 25 MG capsule Take 50 mg by mouth at bedtime as needed. 09/02/14   Historical Provider, MD  levothyroxine (SYNTHROID, LEVOTHROID) 25 MCG tablet Take 25 mcg by  mouth daily.    Historical Provider, MD  lisinopril (PRINIVIL,ZESTRIL) 10 MG tablet Take 10 mg by mouth daily. 07/02/14 07/02/15  Historical Provider, MD  Multiple Vitamin (MULTI-VITAMINS) TABS Take 1 tablet by mouth daily.    Historical Provider, MD  nitroGLYCERIN (NITROSTAT) 0.4 MG SL tablet Place 0.4 mg under the tongue every 5 (five) minutes as needed for chest pain.     Historical Provider, MD  omeprazole (PRILOSEC) 20 MG capsule Take 20 mg by mouth 2 (two) times daily. 09/19/14   Historical Provider, MD  ondansetron (ZOFRAN ODT) 4 MG disintegrating tablet '4mg'$  ODT q4 hours prn nausea/vomit 12/17/14   Charlesetta Shanks, MD  Oxycodone HCl 10 MG TABS Take 10 mg by mouth every 4 (four) hours as needed (for pain).  09/10/14   Historical Provider, MD  polyethylene glycol (MIRALAX / GLYCOLAX) packet Take 17 g by mouth daily as needed for mild constipation.     Historical Provider, MD  predniSONE (DELTASONE) 20 MG tablet Take 2 tablets (40 mg total) by mouth daily with breakfast. 10/23/14   Merryl Hacker, MD  promethazine (PHENERGAN) 25 MG tablet Take 1 tablet (25 mg total) by mouth every 6 (six) hours as needed for nausea or vomiting. 10/23/14   Merryl Hacker, MD  QUEtiapine (SEROQUEL) 100 MG tablet Take 100 mg by mouth 2 (two) times daily. 09/12/14   Historical Provider, MD  simvastatin (ZOCOR) 20 MG tablet Take 20 mg by mouth every evening.    Historical Provider, MD  temazepam (RESTORIL) 30 MG capsule Take 30 mg by mouth daily. 09/23/14   Historical Provider, MD  traZODone (DESYREL) 100 MG tablet Take 200 mg by mouth at bedtime.    Historical Provider, MD  venlafaxine (EFFEXOR) 100 MG tablet Take 300 mg by mouth daily.    Historical Provider, MD   BP 144/86 mmHg  Pulse 90  Temp(Src) 98.7 F (37.1 C) (Oral)  Resp 16  Ht 6' (1.829 m)  Wt 252 lb (114.306 kg)  BMI 34.17 kg/m2  SpO2 99%   Physical Exam  Constitutional: He is oriented to person, place, and time. He appears well-developed and  well-nourished. No distress.  HENT:  Head: Normocephalic and atraumatic.  Eyes: Conjunctivae and EOM are normal.  Neck: Neck supple. No tracheal deviation present.  Cardiovascular: Normal rate and regular rhythm.  Exam reveals no gallop and no friction rub.   No murmur heard. Pulmonary/Chest: Effort normal. No respiratory distress. He has wheezes.  Expiratory wheezing in right base.   Musculoskeletal: Normal range of motion. He exhibits no edema or tenderness.  Patient has a minor abrasion to the pretibial right lower extremity. There is mild erythema surrounding that. His calves are soft and nontender. He does not have lower extremity edema.  Neurological: He is alert and oriented to person, place, and time.  Skin: Skin is warm and dry.  Psychiatric: He has a normal  mood and affect. His behavior is normal.  Nursing note and vitals reviewed.   ED Course  Procedures (including critical care time) DIAGNOSTIC STUDIES: Oxygen Saturation is 99% on 2L [place], normal by my interpretation.    COORDINATION OF CARE: 9:45 PM-Discussed treatment plan with pt at bedside and pt agreed to plan.     Labs Review Labs Reviewed  BASIC METABOLIC PANEL - Abnormal; Notable for the following:    Sodium 130 (*)    Potassium 3.0 (*)    Chloride 98 (*)    CO2 21 (*)    Glucose, Bld 149 (*)    Calcium 8.7 (*)    All other components within normal limits  CBC - Abnormal; Notable for the following:    WBC 17.0 (*)    Hemoglobin 12.6 (*)    HCT 35.9 (*)    All other components within normal limits  TROPONIN I  HEPATIC FUNCTION PANEL  LIPASE, BLOOD  TROPONIN I    Imaging Review Dg Chest 2 View  12/17/2014  CLINICAL DATA:  Left-sided chest pain.  Shortness of breath. EXAM: CHEST  2 VIEW COMPARISON:  11/29/2014 FINDINGS: Lungs are clear without airspace disease or pulmonary edema. Heart and mediastinum are within normal limits and stable. The trachea is midline. No pleural effusions. IMPRESSION: No  active cardiopulmonary disease. Electronically Signed   By: Markus Daft M.D.   On: 12/17/2014 21:29   I have personally reviewed and evaluated these images and lab results as part of my medical decision-making.   EKG Interpretation   Date/Time:  Wednesday December 17 2014 20:49:34 EDT Ventricular Rate:  97 PR Interval:  142 QRS Duration: 76 QT Interval:  388 QTC Calculation: 492 R Axis:   20 Text Interpretation:  Normal sinus rhythm Low voltage QRS Cannot rule out  Anterior infarct , age undetermined Abnormal ECG Confirmed by Johnney Killian,  MD, Jeannie Done [phone]) on 12/17/2014 9:22:00 PM      MDM   Final diagnoses:  Non-intractable vomiting with nausea, vomiting of unspecified type  Intercostal pain   Patient's pain began after an abrupt onset of vomiting multiple episodes. This apparently has occurred sporadically since the patient's cholecystectomy. The patient does not show signs of acute obstruction. His pain is now chest pain that is reproducible with movements and deep breath. This is consistent with chest wall strain after vigorous emesis. Epigastrium is soft without guarding. EKG does not have ischemic appearance. Patient has not had further vomiting since in the emergency department. Vital signs are stable. Patient be treated for nausea with Zofran and initiated on twice a day Pepcid. He is instructed to follow-up with his family doctor check within one to 2 days. Patient reports that his insurance will not cover for Zofran and will only cover for tablet form Phenergan. He reports he is almost out of that and would like to get another prescription. He reports at home he typically takes oxycodone 10 mg immediate release for pain control.     Charlesetta Shanks, MD 12/17/14 [id number]  Charlesetta Shanks, MD 12/18/14 [phone]

## 2014-12-17 NOTE — Discharge Instructions (Signed)
Nausea and Vomiting Nausea is a sick feeling that often comes before throwing up (vomiting). Vomiting is a reflex where stomach contents come out of your mouth. Vomiting can cause severe loss of body fluids (dehydration). Children and elderly adults can become dehydrated quickly, especially if they also have diarrhea. Nausea and vomiting are symptoms of a condition or disease. It is important to find the cause of your symptoms. CAUSES   Direct irritation of the stomach lining. This irritation can result from increased acid production (gastroesophageal reflux disease), infection, food poisoning, taking certain medicines (such as nonsteroidal anti-inflammatory drugs), alcohol use, or tobacco use.  Signals from the brain.These signals could be caused by a headache, heat exposure, an inner ear disturbance, increased pressure in the brain from injury, infection, a tumor, or a concussion, pain, emotional stimulus, or metabolic problems.  An obstruction in the gastrointestinal tract (bowel obstruction).  Illnesses such as diabetes, hepatitis, gallbladder problems, appendicitis, kidney problems, cancer, sepsis, atypical symptoms of a heart attack, or eating disorders.  Medical treatments such as chemotherapy and radiation.  Receiving medicine that makes you sleep (general anesthetic) during surgery. DIAGNOSIS Your caregiver may ask for tests to be done if the problems do not improve after a few days. Tests may also be done if symptoms are severe or if the reason for the nausea and vomiting is not clear. Tests may include:  Urine tests.  Blood tests.  Stool tests.  Cultures (to look for evidence of infection).  X-rays or other imaging studies. Test results can help your caregiver make decisions about treatment or the need for additional tests. TREATMENT You need to stay well hydrated. Drink frequently but in small amounts.You may wish to drink water, sports drinks, clear broth, or eat frozen  ice pops or gelatin dessert to help stay hydrated.When you eat, eating slowly may help prevent nausea.There are also some antinausea medicines that may help prevent nausea. HOME CARE INSTRUCTIONS   Take all medicine as directed by your caregiver.  If you do not have an appetite, do not force yourself to eat. However, you must continue to drink fluids.  If you have an appetite, eat a normal diet unless your caregiver tells you differently.  Eat a variety of complex carbohydrates (rice, wheat, potatoes, bread), lean meats, yogurt, fruits, and vegetables.  Avoid high-fat foods because they are more difficult to digest.  Drink enough water and fluids to keep your urine clear or pale yellow.  If you are dehydrated, ask your caregiver for specific rehydration instructions. Signs of dehydration may include:  Severe thirst.  Dry lips and mouth.  Dizziness.  Dark urine.  Decreasing urine frequency and amount.  Confusion.  Rapid breathing or pulse. SEEK IMMEDIATE MEDICAL CARE IF:   You have blood or brown flecks (like coffee grounds) in your vomit.  You have black or bloody stools.  You have a severe headache or stiff neck.  You are confused.  You have severe abdominal pain.  You have chest pain or trouble breathing.  You do not urinate at least once every 8 hours.  You develop cold or clammy skin.  You continue to vomit for longer than 24 to 48 hours.  You have a fever. MAKE SURE YOU:   Understand these instructions.  Will watch your condition.  Will get help right away if you are not doing well or get worse.   This information is not intended to replace advice given to you by your health care provider. Make sure  you discuss any questions you have with your health care provider.   Document Released: 02/07/2005 Document Revised: 05/02/2011 Document Reviewed: 07/07/2010 Elsevier Interactive Patient Education 2016 [employer]  Chest Wall Pain Chest wall pain  is pain in or around the bones and muscles of your chest. Sometimes, an injury causes this pain. Sometimes, the cause may not be known. This pain may take several weeks or longer to get better. HOME CARE INSTRUCTIONS  Pay attention to any changes in your symptoms. Take these actions to help with your pain:   Rest as told by your health care provider.   Avoid activities that cause pain. These include any activities that use your chest muscles or your abdominal and side muscles to lift heavy items.   If directed, apply ice to the painful area:  Put ice in a plastic bag.  Place a towel between your skin and the bag.  Leave the ice on for 20 minutes, 2-3 times per day.  Take over-the-counter and prescription medicines only as told by your health care provider.  Do not use tobacco products, including cigarettes, chewing tobacco, and e-cigarettes. If you need help quitting, ask your health care provider.  Keep all follow-up visits as told by your health care provider. This is important. SEEK MEDICAL CARE IF:  You have a fever.  Your chest pain becomes worse.  You have new symptoms. SEEK IMMEDIATE MEDICAL CARE IF:  You have nausea or vomiting.  You feel sweaty or light-headed.  You have a cough with phlegm (sputum) or you cough up blood.  You develop shortness of breath.   This information is not intended to replace advice given to you by your health care provider. Make sure you discuss any questions you have with your health care provider.   Document Released: 02/07/2005 Document Revised: 10/29/2014 Document Reviewed: 05/05/2014 Elsevier Interactive Patient Education [employer].

## 2014-12-17 NOTE — ED Notes (Signed)
Pt c/o left side chest pain that started at 1815.  Pain radiates to right side of chest with SOB, n/v and diaphoresis associated with pain.  Pt has hx of COPD, 2 heart attacks with 3 stent placements.

## 2014-12-18 LAB — TROPONIN I

## 2014-12-18 MED ORDER — ACETAMINOPHEN 500 MG PO TABS
1000.0000 mg | ORAL_TABLET | Freq: Once | ORAL | Status: AC
  Administered 2014-12-18: 1000 mg via ORAL
  Filled 2014-12-18: qty 2

## 2014-12-18 MED ORDER — PROMETHAZINE HCL 25 MG PO TABS: 25.0000 mg | ORAL_TABLET | Freq: Four times a day (QID) | ORAL | Status: DC | PRN

## 2015-02-08 ENCOUNTER — Emergency Department (HOSPITAL_BASED_OUTPATIENT_CLINIC_OR_DEPARTMENT_OTHER): Payer: Medicare HMO

## 2015-02-08 ENCOUNTER — Emergency Department (HOSPITAL_BASED_OUTPATIENT_CLINIC_OR_DEPARTMENT_OTHER)
Admission: EM | Admit: 2015-02-08 | Discharge: 2015-02-08 | Payer: Medicare HMO | Attending: Emergency Medicine | Admitting: Emergency Medicine

## 2015-02-08 ENCOUNTER — Encounter (HOSPITAL_BASED_OUTPATIENT_CLINIC_OR_DEPARTMENT_OTHER): Payer: Self-pay | Admitting: Emergency Medicine

## 2015-02-08 DIAGNOSIS — J441 Chronic obstructive pulmonary disease with (acute) exacerbation: Secondary | ICD-10-CM | POA: Insufficient documentation

## 2015-02-08 DIAGNOSIS — F319 Bipolar disorder, unspecified: Secondary | ICD-10-CM | POA: Insufficient documentation

## 2015-02-08 DIAGNOSIS — I252 Old myocardial infarction: Secondary | ICD-10-CM | POA: Diagnosis not present

## 2015-02-08 DIAGNOSIS — I10 Essential (primary) hypertension: Secondary | ICD-10-CM | POA: Insufficient documentation

## 2015-02-08 DIAGNOSIS — Z7982 Long term (current) use of aspirin: Secondary | ICD-10-CM | POA: Diagnosis not present

## 2015-02-08 DIAGNOSIS — R079 Chest pain, unspecified: Secondary | ICD-10-CM | POA: Diagnosis present

## 2015-02-08 DIAGNOSIS — Z79899 Other long term (current) drug therapy: Secondary | ICD-10-CM | POA: Insufficient documentation

## 2015-02-08 DIAGNOSIS — Z7951 Long term (current) use of inhaled steroids: Secondary | ICD-10-CM | POA: Diagnosis not present

## 2015-02-08 DIAGNOSIS — E039 Hypothyroidism, unspecified: Secondary | ICD-10-CM | POA: Insufficient documentation

## 2015-02-08 DIAGNOSIS — Z792 Long term (current) use of antibiotics: Secondary | ICD-10-CM | POA: Insufficient documentation

## 2015-02-08 DIAGNOSIS — F17200 Nicotine dependence, unspecified, uncomplicated: Secondary | ICD-10-CM | POA: Insufficient documentation

## 2015-02-08 DIAGNOSIS — Z7952 Long term (current) use of systemic steroids: Secondary | ICD-10-CM | POA: Diagnosis not present

## 2015-02-08 LAB — BASIC METABOLIC PANEL
Anion gap: 12 (ref 5–15)
BUN: 6 mg/dL (ref 6–20)
CALCIUM: 9.1 mg/dL (ref 8.9–10.3)
CO2: 22 mmol/L (ref 22–32)
Chloride: 99 mmol/L — ABNORMAL LOW (ref 101–111)
Creatinine, Ser: 0.68 mg/dL (ref 0.61–1.24)
GFR calc Af Amer: >60 mL/min (ref >60)
Glucose, Bld: 151 mg/dL — ABNORMAL HIGH (ref 65–99)
POTASSIUM: 3.2 mmol/L — AB (ref 3.5–5.1)
SODIUM: 133 mmol/L — AB (ref 135–145)

## 2015-02-08 LAB — TROPONIN I: TROPONIN I: 0.03 ng/mL (ref <0.031)

## 2015-02-08 LAB — CBC
HEMATOCRIT: 38.4 % — AB (ref 39.0–52.0)
Hemoglobin: 13.4 g/dL (ref 13.0–17.0)
MCH: 28.8 pg (ref 26.0–34.0)
MCHC: 34.9 g/dL (ref 30.0–36.0)
MCV: 82.6 fL (ref 78.0–100.0)
PLATELETS: 230 10*3/uL (ref 150–400)
RBC: 4.65 MIL/uL (ref 4.22–5.81)
RDW: 13.8 % (ref 11.5–15.5)
WBC: 12 10*3/uL — AB (ref 4.0–10.5)

## 2015-02-08 MED ORDER — FENTANYL CITRATE (PF) 100 MCG/2ML IJ SOLN
100.0000 ug | Freq: Once | INTRAMUSCULAR | Status: AC
  Administered 2015-02-08: 100 ug via INTRAVENOUS
  Filled 2015-02-08: qty 2

## 2015-02-08 MED ORDER — LEVALBUTEROL HCL 0.63 MG/3ML IN NEBU
0.6300 mg | INHALATION_SOLUTION | Freq: Once | RESPIRATORY_TRACT | Status: AC
  Administered 2015-02-08: 0.63 mg via RESPIRATORY_TRACT
  Filled 2015-02-08: qty 3

## 2015-02-08 MED ORDER — NITROGLYCERIN 0.4 MG SL SUBL
0.4000 mg | SUBLINGUAL_TABLET | SUBLINGUAL | Status: AC | PRN
  Administered 2015-02-08 (×3): 0.4 mg via SUBLINGUAL
  Filled 2015-02-08: qty 1

## 2015-02-08 MED ORDER — POTASSIUM CHLORIDE CRYS ER 20 MEQ PO TBCR
40.0000 meq | EXTENDED_RELEASE_TABLET | Freq: Once | ORAL | Status: AC
  Administered 2015-02-08: 40 meq via ORAL
  Filled 2015-02-08: qty 2

## 2015-02-08 MED ORDER — ONDANSETRON HCL 4 MG/2ML IJ SOLN
4.0000 mg | Freq: Once | INTRAMUSCULAR | Status: AC
  Administered 2015-02-08: 4 mg via INTRAVENOUS
  Filled 2015-02-08: qty 2

## 2015-02-08 MED ORDER — PROMETHAZINE HCL 25 MG/ML IJ SOLN
12.5000 mg | Freq: Once | INTRAMUSCULAR | Status: AC
  Administered 2015-02-08: 12.5 mg via INTRAVENOUS
  Filled 2015-02-08: qty 1

## 2015-02-08 MED ORDER — ONDANSETRON HCL 4 MG/2ML IJ SOLN: 4.0000 mg | Freq: Once | INTRAMUSCULAR | Status: DC

## 2015-02-08 NOTE — ED Provider Notes (Signed)
CSN: [id number]     Arrival date & time 02/08/15  8546 History   First MD Initiated Contact with Patient 02/08/15 0305     Chief Complaint  Patient presents with  . Chest Pain     (Consider location/radiation/quality/duration/timing/severity/associated sxs/prior Treatment) HPI  This is a 59 year old male with a history of coronary artery disease and COPD. He is here with chest pain that began about an hour prior to arrival. The pain is located in his substernal region radiating to his jaw. He characterizes it as like previous angina and rates a 9 out of 10 presently. He took 3 nitroglycerin tablets prior to arrival without relief. He did develop a headache after nitroglycerin. He takes aspirin 325 milligrams daily. He has associated shortness of breath and nausea and had diaphoresis with its onset.   Past Medical History  Diagnosis Date  . COPD (chronic obstructive pulmonary disease) ([place])   . Bipolar 1 disorder ([place])   . Heart attack ([place])   . Additional heart attack   . Hypothyroidism   . Asthma   . Hypertension    Past Surgical History  Procedure Laterality Date  . Coronary angioplasty with stent placement    . Appendectomy    . Tonsillectomy    . Hernia repair    . Knee surgery    . Cholecystectomy     No family history on file. Social History  Substance Use Topics  . Smoking status: Current Every Day Smoker  . Smokeless tobacco: Never Used  . Alcohol Use: No    Review of Systems  All other systems reviewed and are negative.   Allergies  Rocephin; Prochlorperazine edisylate; Toradol; and Tramadol  Home Medications   Prior to Admission medications   Medication Sig Start Date End Date Taking? Authorizing Provider  acetaminophen (TYLENOL) 500 MG chewable tablet Chew 1,000 mg by mouth every 6 (six) hours as needed. For pain    Historical Provider, MD  aspirin 325 MG tablet Take 325 mg by mouth daily.    Historical Provider, MD  cyclobenzaprine (FLEXERIL) 5 MG  tablet Take 1 tablet (5 mg total) by mouth every 6 (six) hours as needed (for back pain). 09/25/14   Reyne Dumas, MD  docusate sodium (STOOL SOFTENER) 100 MG capsule Take 100 mg by mouth 2 (two) times daily.    Historical Provider, MD  doxycycline (VIBRAMYCIN) 100 MG capsule Take 1 capsule (100 mg total) by mouth 2 (two) times daily. 10/23/14   Merryl Hacker, MD  famotidine (PEPCID) 20 MG tablet Take 1 tablet (20 mg total) by mouth 2 (two) times daily. 12/17/14   Charlesetta Shanks, MD  fluticasone-salmeterol (ADVAIR HFA) [phone] MCG/ACT inhaler Inhale 1 puff into the lungs 2 (two) times daily.     Historical Provider, MD  hydrOXYzine (VISTARIL) 25 MG capsule Take 50 mg by mouth at bedtime as needed. 09/02/14   Historical Provider, MD  levothyroxine (SYNTHROID, LEVOTHROID) 25 MCG tablet Take 25 mcg by mouth daily.    Historical Provider, MD  lisinopril (PRINIVIL,ZESTRIL) 10 MG tablet Take 10 mg by mouth daily. 07/02/14 07/02/15  Historical Provider, MD  Multiple Vitamin (MULTI-VITAMINS) TABS Take 1 tablet by mouth daily.    Historical Provider, MD  nitroGLYCERIN (NITROSTAT) 0.4 MG SL tablet Place 0.4 mg under the tongue every 5 (five) minutes as needed for chest pain.     Historical Provider, MD  omeprazole (PRILOSEC) 20 MG capsule Take 20 mg by mouth 2 (two) times daily. 09/19/14  Historical Provider, MD  ondansetron (ZOFRAN ODT) 4 MG disintegrating tablet '4mg'$  ODT q4 hours prn nausea/vomit 12/17/14   Charlesetta Shanks, MD  Oxycodone HCl 10 MG TABS Take 10 mg by mouth every 4 (four) hours as needed (for pain).  09/10/14   Historical Provider, MD  polyethylene glycol (MIRALAX / GLYCOLAX) packet Take 17 g by mouth daily as needed for mild constipation.     Historical Provider, MD  predniSONE (DELTASONE) 20 MG tablet Take 2 tablets (40 mg total) by mouth daily with breakfast. 10/23/14   Merryl Hacker, MD  promethazine (PHENERGAN) 25 MG tablet Take 1 tablet (25 mg total) by mouth every 6 (six) hours as needed for  nausea or vomiting. 10/23/14   Merryl Hacker, MD  promethazine (PHENERGAN) 25 MG tablet Take 1 tablet (25 mg total) by mouth every 6 (six) hours as needed for nausea or vomiting. 12/18/14   Charlesetta Shanks, MD  QUEtiapine (SEROQUEL) 100 MG tablet Take 100 mg by mouth 2 (two) times daily. 09/12/14   Historical Provider, MD  simvastatin (ZOCOR) 20 MG tablet Take 20 mg by mouth every evening.    Historical Provider, MD  temazepam (RESTORIL) 30 MG capsule Take 30 mg by mouth daily. 09/23/14   Historical Provider, MD  traZODone (DESYREL) 100 MG tablet Take 200 mg by mouth at bedtime.    Historical Provider, MD  venlafaxine (EFFEXOR) 100 MG tablet Take 300 mg by mouth daily.    Historical Provider, MD   BP 139/75 mmHg  Pulse 73  Resp 10  Ht 6' (1.829 m)  Wt 250 lb (113.399 kg)  BMI 33.90 kg/m2  SpO2 100%   Physical Exam  General: Well-developed, well-nourished male in no acute distress; appearance consistent with age of record HENT: normocephalic; atraumatic Eyes: pupils equal, round and reactive to light; extraocular muscles intact Neck: supple Heart: regular rate and rhythm; no murmurs, rubs or gallops Lungs: Inspiratory and expiratory wheezing; tachypnea Abdomen: soft; nondistended; nontender; hepatomegaly; bowel sounds present Extremities: No deformity; full range of motion; pulses normal Neurologic: Awake, alert and oriented; motor function intact in all extremities and symmetric; no facial droop Skin: Warm and dry Psychiatric: Anxious    ED Course  Procedures (including critical care time)  CRITICAL CARE Performed by: Doyt Castellana L Total critical care time: 35 minutes Critical care time was exclusive of separately billable procedures and treating other patients. Critical care was necessary to treat or prevent imminent or life-threatening deterioration. Critical care was time spent personally by me on the following activities: development of treatment plan with patient and/or  surrogate as well as nursing, discussions with consultants, evaluation of patient's response to treatment, examination of patient, obtaining history from patient or surrogate, ordering and performing treatments and interventions, ordering and review of laboratory studies, ordering and review of radiographic studies, pulse oximetry and re-evaluation of patient's condition.   MDM  Nursing notes and vitals signs, including pulse oximetry, reviewed.  Summary of this visit's results, reviewed by myself:   EKG Interpretation  Date/Time:  Sunday February 08 2015 03:50:26 EST Ventricular Rate:  78 PR Interval:  140 QRS Duration: 74 QT Interval:  436 QTC Calculation: 497 R Axis:   29 Text Interpretation:  Normal sinus rhythm Low voltage QRS Overwise unchanged Confirmed by Florina Ou  MD, Jenny Reichmann ([id number]) on 02/08/2015 3:56:30 AM       EKG Interpretation  Date/Time:  Sunday February 08 2015 03:50:26 EST Ventricular Rate:  78 PR Interval:  140 QRS Duration: 74 QT  Interval:  436 QTC Calculation: 497 R Axis:   29 Text Interpretation:  Normal sinus rhythm Low voltage QRS Overwise unchanged Confirmed by Lihanna Biever  MD, Jenny Reichmann ([id number]) on 02/08/2015 3:56:30 AM       Labs:  Results for orders placed or performed during the hospital encounter of 02/08/15 (from the past 24 hour(s))  Basic metabolic panel     Status: Abnormal   Collection Time: 02/08/15  3:05 AM  Result Value Ref Range   Sodium 133 (L) 135 - 145 mmol/L   Potassium 3.2 (L) 3.5 - 5.1 mmol/L   Chloride 99 (L) 101 - 111 mmol/L   CO2 22 22 - 32 mmol/L   Glucose, Bld 151 (H) 65 - 99 mg/dL   BUN 6 6 - 20 mg/dL   Creatinine, Ser 0.68 0.61 - 1.24 mg/dL   Calcium 9.1 8.9 - 10.3 mg/dL   GFR calc non Af Amer >60 >60 mL/min   GFR calc Af Amer >60 >60 mL/min   Anion gap 12 5 - 15  CBC     Status: Abnormal   Collection Time: 02/08/15  3:05 AM  Result Value Ref Range   WBC 12.0 (H) 4.0 - 10.5 K/uL   RBC 4.65 4.22 - 5.81 MIL/uL   Hemoglobin 13.4  13.0 - 17.0 g/dL   HCT 38.4 (L) 39.0 - 52.0 %   MCV 82.6 78.0 - 100.0 fL   MCH 28.8 26.0 - 34.0 pg   MCHC 34.9 30.0 - 36.0 g/dL   RDW 13.8 11.5 - 15.5 %   Platelets 230 150 - 400 K/uL  Troponin I     Status: None   Collection Time: 02/08/15  3:05 AM  Result Value Ref Range   Troponin I <0.03 <0.031 ng/mL  Troponin I     Status: None   Collection Time: 02/08/15  4:45 AM  Result Value Ref Range   Troponin I 0.03 <0.031 ng/mL    Imaging Studies: Dg Chest 2 View  02/08/2015  CLINICAL DATA:  Chest pain with cold sweats starting about an hour ago. Shortness of breath and nausea. EXAM: CHEST  2 VIEW COMPARISON:  01/13/2015 FINDINGS: The heart size and mediastinal contours are within normal limits. Both lungs are clear. The visualized skeletal structures are unremarkable. IMPRESSION: No active cardiopulmonary disease. Electronically Signed   By: Lucienne Capers M.D.   On: 02/08/2015 03:23   3:58 AM Pain now 5 out of 10 after 3 sublingual nitroglycerin and 100 micrograms of fentanyl IV. EKG essentially unchanged.   4:42 AM Accepted for transfer to [facility] by Dr. Milana Obey. Pain now 4/10. Repeat troponin pending.  5:35 AM Awaiting transfer to [facility].     Shanon Rosser, MD 02/08/15 [phone]

## 2015-02-08 NOTE — ED Notes (Signed)
[facility] Team at bedside

## 2015-02-08 NOTE — ED Notes (Signed)
Patient transported to X-ray 

## 2015-02-08 NOTE — ED Notes (Signed)
Chest pain with "cold sweats" that started about an hour ago. Took 3 nitroglycerin at home with no relief. Reports he takes '324mg'$  of ASA daily. Reports shortness of breath, and nausea. Personal history of 2 MI's and 3 cardiac stents.

## 2015-02-08 NOTE — ED Notes (Signed)
Pt resting quietly, cont on cardiac monitor with NSR noted, denies any chest pain at this time

## 2015-07-22 LAB — LIPID PANEL
Cholesterol: 131 (ref 0–200)
HDL: 32 — AB (ref 35–70)
LDL Cholesterol: 58
Triglycerides: 206 — AB (ref 40–160)

## 2015-11-05 ENCOUNTER — Emergency Department (HOSPITAL_BASED_OUTPATIENT_CLINIC_OR_DEPARTMENT_OTHER): Payer: Medicare Other

## 2015-11-05 ENCOUNTER — Encounter (HOSPITAL_COMMUNITY)
Admission: EM | Disposition: A | Discharge status: Home / Self Care | Payer: Self-pay | Source: Home / Self Care | Attending: Cardiology

## 2015-11-05 ENCOUNTER — Encounter (HOSPITAL_BASED_OUTPATIENT_CLINIC_OR_DEPARTMENT_OTHER): Payer: Self-pay | Admitting: Emergency Medicine

## 2015-11-05 ENCOUNTER — Inpatient Hospital Stay (HOSPITAL_BASED_OUTPATIENT_CLINIC_OR_DEPARTMENT_OTHER)
Admission: EM | Admit: 2015-11-05 | Discharge: 2015-11-06 | DRG: 247 | Disposition: A | Discharge status: Home / Self Care | Payer: Medicare Other | Attending: Cardiology | Admitting: Cardiology

## 2015-11-05 DIAGNOSIS — N189 Chronic kidney disease, unspecified: Secondary | ICD-10-CM | POA: Diagnosis not present

## 2015-11-05 DIAGNOSIS — I25110 Atherosclerotic heart disease of native coronary artery with unstable angina pectoris: Secondary | ICD-10-CM | POA: Diagnosis not present

## 2015-11-05 DIAGNOSIS — I2584 Coronary atherosclerosis due to calcified coronary lesion: Secondary | ICD-10-CM | POA: Diagnosis not present

## 2015-11-05 DIAGNOSIS — Z7982 Long term (current) use of aspirin: Secondary | ICD-10-CM | POA: Diagnosis not present

## 2015-11-05 DIAGNOSIS — E785 Hyperlipidemia, unspecified: Secondary | ICD-10-CM | POA: Diagnosis present

## 2015-11-05 DIAGNOSIS — F319 Bipolar disorder, unspecified: Secondary | ICD-10-CM | POA: Diagnosis not present

## 2015-11-05 DIAGNOSIS — Z7952 Long term (current) use of systemic steroids: Secondary | ICD-10-CM | POA: Diagnosis not present

## 2015-11-05 DIAGNOSIS — Z955 Presence of coronary angioplasty implant and graft: Secondary | ICD-10-CM | POA: Diagnosis not present

## 2015-11-05 DIAGNOSIS — I252 Old myocardial infarction: Secondary | ICD-10-CM

## 2015-11-05 DIAGNOSIS — I209 Angina pectoris, unspecified: Secondary | ICD-10-CM | POA: Diagnosis not present

## 2015-11-05 DIAGNOSIS — F17210 Nicotine dependence, cigarettes, uncomplicated: Secondary | ICD-10-CM | POA: Diagnosis present

## 2015-11-05 DIAGNOSIS — E039 Hypothyroidism, unspecified: Secondary | ICD-10-CM | POA: Diagnosis not present

## 2015-11-05 DIAGNOSIS — Z79899 Other long term (current) drug therapy: Secondary | ICD-10-CM | POA: Diagnosis not present

## 2015-11-05 DIAGNOSIS — Z86718 Personal history of other venous thrombosis and embolism: Secondary | ICD-10-CM

## 2015-11-05 DIAGNOSIS — R079 Chest pain, unspecified: Secondary | ICD-10-CM | POA: Diagnosis present

## 2015-11-05 DIAGNOSIS — I129 Hypertensive chronic kidney disease with stage 1 through stage 4 chronic kidney disease, or unspecified chronic kidney disease: Secondary | ICD-10-CM | POA: Diagnosis not present

## 2015-11-05 DIAGNOSIS — Z6833 Body mass index (BMI) 33.0-33.9, adult: Secondary | ICD-10-CM

## 2015-11-05 DIAGNOSIS — Z23 Encounter for immunization: Secondary | ICD-10-CM | POA: Diagnosis not present

## 2015-11-05 DIAGNOSIS — I10 Essential (primary) hypertension: Secondary | ICD-10-CM | POA: Diagnosis not present

## 2015-11-05 DIAGNOSIS — E669 Obesity, unspecified: Secondary | ICD-10-CM | POA: Diagnosis not present

## 2015-11-05 DIAGNOSIS — I2510 Atherosclerotic heart disease of native coronary artery without angina pectoris: Secondary | ICD-10-CM

## 2015-11-05 DIAGNOSIS — Z7951 Long term (current) use of inhaled steroids: Secondary | ICD-10-CM | POA: Diagnosis not present

## 2015-11-05 DIAGNOSIS — Z95820 Peripheral vascular angioplasty status with implants and grafts: Secondary | ICD-10-CM

## 2015-11-05 DIAGNOSIS — I25119 Atherosclerotic heart disease of native coronary artery with unspecified angina pectoris: Secondary | ICD-10-CM

## 2015-11-05 DIAGNOSIS — J449 Chronic obstructive pulmonary disease, unspecified: Secondary | ICD-10-CM | POA: Diagnosis present

## 2015-11-05 DIAGNOSIS — K219 Gastro-esophageal reflux disease without esophagitis: Secondary | ICD-10-CM | POA: Diagnosis not present

## 2015-11-05 HISTORY — PX: CARDIAC CATHETERIZATION: SHX172

## 2015-11-05 HISTORY — DX: Atherosclerotic heart disease of native coronary artery without angina pectoris: I25.10

## 2015-11-05 HISTORY — DX: Low back pain, unspecified: M54.50

## 2015-11-05 HISTORY — DX: Unspecified chronic bronchitis: J42

## 2015-11-05 HISTORY — DX: Anxiety disorder, unspecified: F41.9

## 2015-11-05 HISTORY — DX: Other chronic pain: G89.29

## 2015-11-05 HISTORY — DX: Low back pain: M54.5

## 2015-11-05 HISTORY — PX: CORONARY ANGIOPLASTY WITH STENT PLACEMENT: SHX49

## 2015-11-05 HISTORY — DX: Peripheral vascular angioplasty status with implants and grafts: Z95.820

## 2015-11-05 HISTORY — DX: Pneumonia, unspecified organism: J18.9

## 2015-11-05 HISTORY — DX: Angina pectoris, unspecified: I20.9

## 2015-11-05 HISTORY — DX: Pure hypercholesterolemia, unspecified: E78.00

## 2015-11-05 HISTORY — DX: Unspecified osteoarthritis, unspecified site: M19.90

## 2015-11-05 HISTORY — DX: Acute embolism and thrombosis of unspecified deep veins of unspecified lower extremity: I82.409

## 2015-11-05 HISTORY — DX: Gastro-esophageal reflux disease without esophagitis: K21.9

## 2015-11-05 LAB — BASIC METABOLIC PANEL
ANION GAP: 9 (ref 5–15)
BUN: 13 mg/dL (ref 6–20)
CALCIUM: 9.3 mg/dL (ref 8.9–10.3)
CO2: 25 mmol/L (ref 22–32)
Chloride: 101 mmol/L (ref 101–111)
Creatinine, Ser: 0.73 mg/dL (ref 0.61–1.24)
GFR calc Af Amer: >60 mL/min (ref >60)
GLUCOSE: 100 mg/dL — AB (ref 65–99)
POTASSIUM: 4 mmol/L (ref 3.5–5.1)
SODIUM: 135 mmol/L (ref 135–145)

## 2015-11-05 LAB — BRAIN NATRIURETIC PEPTIDE: B NATRIURETIC PEPTIDE 5: 6 pg/mL (ref 0.0–100.0)

## 2015-11-05 LAB — CBC
HCT: 43.2 % (ref 39.0–52.0)
HCT: 41.1 % (ref 39.0–52.0)
HEMOGLOBIN: 15.2 g/dL (ref 13.0–17.0)
Hemoglobin: 13.7 g/dL (ref 13.0–17.0)
MCH: 29.6 pg (ref 26.0–34.0)
MCH: 28.5 pg (ref 26.0–34.0)
MCHC: 35.2 g/dL (ref 30.0–36.0)
MCHC: 33.3 g/dL (ref 30.0–36.0)
MCV: 84 fL (ref 78.0–100.0)
MCV: 85.4 fL (ref 78.0–100.0)
Platelets: 242 10*3/uL (ref 150–400)
Platelets: 215 10*3/uL (ref 150–400)
RBC: 5.14 MIL/uL (ref 4.22–5.81)
RBC: 4.81 MIL/uL (ref 4.22–5.81)
RDW: 15.1 % (ref 11.5–15.5)
RDW: 15 % (ref 11.5–15.5)
WBC: 12.6 10*3/uL — AB (ref 4.0–10.5)
WBC: 17.2 10*3/uL — AB (ref 4.0–10.5)

## 2015-11-05 LAB — POCT ACTIVATED CLOTTING TIME
Activated Clotting Time: 241 s
Activated Clotting Time: 252 s
Activated Clotting Time: 367 s

## 2015-11-05 LAB — CREATININE, SERUM
Creatinine, Ser: 0.94 mg/dL (ref 0.61–1.24)
GFR calc non Af Amer: >60 mL/min (ref >60)

## 2015-11-05 LAB — MRSA PCR SCREENING: MRSA by PCR: NEGATIVE

## 2015-11-05 LAB — TROPONIN I: Troponin I: <0.03 ng/mL (ref <0.03)

## 2015-11-05 SURGERY — LEFT HEART CATH AND CORONARY ANGIOGRAPHY

## 2015-11-05 MED ORDER — IOPAMIDOL (ISOVUE-370) INJECTION 76%
INTRAVENOUS | Status: AC
  Filled 2015-11-05: qty 100

## 2015-11-05 MED ORDER — SODIUM CHLORIDE 0.9% FLUSH
3.0000 mL | Freq: Two times a day (BID) | INTRAVENOUS | Status: DC
  Administered 2015-11-05: 3 mL via INTRAVENOUS

## 2015-11-05 MED ORDER — HEPARIN BOLUS VIA INFUSION
4000.0000 [IU] | Freq: Once | INTRAVENOUS | Status: AC
  Administered 2015-11-05: 4000 [IU] via INTRAVENOUS

## 2015-11-05 MED ORDER — ADULT MULTIVITAMIN W/MINERALS CH
1.0000 | ORAL_TABLET | Freq: Every day | ORAL | Status: DC
  Administered 2015-11-06: 08:00:00 1 via ORAL
  Filled 2015-11-05 (×2): qty 1

## 2015-11-05 MED ORDER — INFLUENZA VAC SPLIT QUAD 0.5 ML IM SUSY
0.5000 mL | PREFILLED_SYRINGE | INTRAMUSCULAR | Status: AC
  Administered 2015-11-06: 10:00:00 0.5 mL via INTRAMUSCULAR

## 2015-11-05 MED ORDER — HYDROMORPHONE HCL 1 MG/ML IJ SOLN
INTRAMUSCULAR | Status: DC | PRN
  Administered 2015-11-05: 0.5 mg via INTRAVENOUS

## 2015-11-05 MED ORDER — POLYETHYLENE GLYCOL 3350 17 G PO PACK: 17.0000 g | PACK | Freq: Every day | ORAL | Status: DC | PRN

## 2015-11-05 MED ORDER — FENTANYL CITRATE (PF) 100 MCG/2ML IJ SOLN
INTRAMUSCULAR | Status: DC | PRN
  Administered 2015-11-05: 25 ug via INTRAVENOUS
  Administered 2015-11-05: 50 ug via INTRAVENOUS
  Administered 2015-11-05: 25 ug via INTRAVENOUS

## 2015-11-05 MED ORDER — NITROGLYCERIN 1 MG/10 ML FOR IR/CATH LAB
INTRA_ARTERIAL | Status: DC | PRN
  Administered 2015-11-05: 200 ug via INTRACORONARY

## 2015-11-05 MED ORDER — IPRATROPIUM-ALBUTEROL 0.5-2.5 (3) MG/3ML IN SOLN: 3.0000 mL | RESPIRATORY_TRACT | Status: DC

## 2015-11-05 MED ORDER — VERAPAMIL HCL 2.5 MG/ML IV SOLN
INTRAVENOUS | Status: DC | PRN
  Administered 2015-11-05: 10 mL via INTRA_ARTERIAL

## 2015-11-05 MED ORDER — QUETIAPINE FUMARATE 100 MG PO TABS
100.0000 mg | ORAL_TABLET | Freq: Two times a day (BID) | ORAL | Status: DC
  Administered 2015-11-05 – 2015-11-06 (×2): 100 mg via ORAL
  Filled 2015-11-05 (×2): qty 1

## 2015-11-05 MED ORDER — OXYCODONE HCL 5 MG PO TABS
10.0000 mg | ORAL_TABLET | ORAL | Status: DC | PRN
  Administered 2015-11-05 – 2015-11-06 (×5): 10 mg via ORAL
  Filled 2015-11-05 (×5): qty 2

## 2015-11-05 MED ORDER — ATORVASTATIN CALCIUM 80 MG PO TABS
80.0000 mg | ORAL_TABLET | Freq: Every day | ORAL | Status: DC
  Administered 2015-11-05: 22:00:00 80 mg via ORAL
  Filled 2015-11-05: qty 1

## 2015-11-05 MED ORDER — HEPARIN (PORCINE) IN NACL 100-0.45 UNIT/ML-% IJ SOLN
1200.0000 [IU]/h | INTRAMUSCULAR | Status: DC
  Administered 2015-11-05: 1200 [IU]/h via INTRAVENOUS
  Filled 2015-11-05: qty 250

## 2015-11-05 MED ORDER — TEMAZEPAM 15 MG PO CAPS
30.0000 mg | ORAL_CAPSULE | Freq: Every day | ORAL | Status: DC
  Administered 2015-11-05: 30 mg via ORAL
  Filled 2015-11-05: qty 2

## 2015-11-05 MED ORDER — VENLAFAXINE HCL ER 150 MG PO CP24
300.0000 mg | ORAL_CAPSULE | Freq: Every day | ORAL | Status: DC
  Administered 2015-11-05 – 2015-11-06 (×2): 300 mg via ORAL
  Filled 2015-11-05 (×2): qty 2

## 2015-11-05 MED ORDER — LISINOPRIL 10 MG PO TABS
10.0000 mg | ORAL_TABLET | Freq: Every day | ORAL | Status: DC
Start: 2015-11-06 — End: 2015-11-06
  Administered 2015-11-06: 10 mg via ORAL
  Filled 2015-11-05: qty 1

## 2015-11-05 MED ORDER — ENOXAPARIN SODIUM 40 MG/0.4ML ~~LOC~~ SOLN
40.0000 mg | SUBCUTANEOUS | Status: DC
  Administered 2015-11-06: 40 mg via SUBCUTANEOUS
  Filled 2015-11-05: qty 0.4

## 2015-11-05 MED ORDER — CLOPIDOGREL BISULFATE 300 MG PO TABS
ORAL_TABLET | ORAL | Status: DC | PRN
  Administered 2015-11-05: 600 mg via ORAL

## 2015-11-05 MED ORDER — IPRATROPIUM-ALBUTEROL 0.5-2.5 (3) MG/3ML IN SOLN
3.0000 mL | Freq: Two times a day (BID) | RESPIRATORY_TRACT | Status: DC
  Administered 2015-11-06: 08:00:00 3 mL via RESPIRATORY_TRACT
  Filled 2015-11-05: qty 3

## 2015-11-05 MED ORDER — ASPIRIN 81 MG PO CHEW
324.0000 mg | CHEWABLE_TABLET | Freq: Once | ORAL | Status: AC
  Administered 2015-11-05: 324 mg via ORAL
  Filled 2015-11-05: qty 4

## 2015-11-05 MED ORDER — HYDROMORPHONE HCL 1 MG/ML IJ SOLN
INTRAMUSCULAR | Status: AC
  Filled 2015-11-05: qty 1

## 2015-11-05 MED ORDER — CLOPIDOGREL BISULFATE 75 MG PO TABS
75.0000 mg | ORAL_TABLET | Freq: Every day | ORAL | Status: DC
  Administered 2015-11-06: 75 mg via ORAL
  Filled 2015-11-05: qty 1

## 2015-11-05 MED ORDER — ANGIOPLASTY BOOK
Freq: Once | Status: AC
  Administered 2015-11-05: 22:00:00
  Filled 2015-11-05: qty 1

## 2015-11-05 MED ORDER — TRAZODONE HCL 50 MG PO TABS
200.0000 mg | ORAL_TABLET | Freq: Every day | ORAL | Status: DC
  Administered 2015-11-05: 22:00:00 200 mg via ORAL
  Filled 2015-11-05: qty 4

## 2015-11-05 MED ORDER — SODIUM CHLORIDE 0.9 % IV SOLN
INTRAVENOUS | Status: AC
  Administered 2015-11-05: 18:00:00 via INTRAVENOUS

## 2015-11-05 MED ORDER — MIDAZOLAM HCL 2 MG/2ML IJ SOLN
INTRAMUSCULAR | Status: DC | PRN
Start: 2015-11-05 — End: 2015-11-05
  Administered 2015-11-05: 1 mg via INTRAVENOUS
  Administered 2015-11-05: 2 mg via INTRAVENOUS
  Administered 2015-11-05: 1 mg via INTRAVENOUS

## 2015-11-05 MED ORDER — FENTANYL CITRATE (PF) 100 MCG/2ML IJ SOLN
INTRAMUSCULAR | Status: AC
  Filled 2015-11-05: qty 2

## 2015-11-05 MED ORDER — ADENOSINE 12 MG/4ML IV SOLN
INTRAVENOUS | Status: AC
  Filled 2015-11-05: qty 4

## 2015-11-05 MED ORDER — METHYLPREDNISOLONE SODIUM SUCC 125 MG IJ SOLR
125.0000 mg | Freq: Once | INTRAMUSCULAR | Status: AC
  Administered 2015-11-05: 125 mg via INTRAVENOUS
  Filled 2015-11-05: qty 2

## 2015-11-05 MED ORDER — HEPARIN SODIUM (PORCINE) 1000 UNIT/ML IJ SOLN
INTRAMUSCULAR | Status: AC
  Filled 2015-11-05: qty 1

## 2015-11-05 MED ORDER — MORPHINE SULFATE (PF) 4 MG/ML IV SOLN
4.0000 mg | Freq: Once | INTRAVENOUS | Status: AC
  Administered 2015-11-05: 4 mg via INTRAVENOUS
  Filled 2015-11-05: qty 1

## 2015-11-05 MED ORDER — SODIUM CHLORIDE 0.9 % IV SOLN: 250.0000 mL | INTRAVENOUS | Status: DC | PRN

## 2015-11-05 MED ORDER — MIDAZOLAM HCL 2 MG/2ML IJ SOLN
INTRAMUSCULAR | Status: AC
  Filled 2015-11-05: qty 2

## 2015-11-05 MED ORDER — IPRATROPIUM-ALBUTEROL 0.5-2.5 (3) MG/3ML IN SOLN
3.0000 mL | RESPIRATORY_TRACT | Status: DC
  Administered 2015-11-05 (×3): 3 mL via RESPIRATORY_TRACT
  Filled 2015-11-05 (×3): qty 3

## 2015-11-05 MED ORDER — LEVOTHYROXINE SODIUM 25 MCG PO TABS
25.0000 ug | ORAL_TABLET | Freq: Every day | ORAL | Status: DC
  Administered 2015-11-06: 06:00:00 25 ug via ORAL
  Filled 2015-11-05: qty 1

## 2015-11-05 MED ORDER — ASPIRIN 81 MG PO CHEW
81.0000 mg | CHEWABLE_TABLET | Freq: Every day | ORAL | Status: DC
  Administered 2015-11-06: 81 mg via ORAL
  Filled 2015-11-05: qty 1

## 2015-11-05 MED ORDER — SODIUM CHLORIDE 0.9 % IV SOLN: INTRAVENOUS | Status: DC

## 2015-11-05 MED ORDER — ACETAMINOPHEN 325 MG PO TABS: 650.0000 mg | ORAL_TABLET | ORAL | Status: DC | PRN

## 2015-11-05 MED ORDER — ONDANSETRON HCL 4 MG/2ML IJ SOLN
4.0000 mg | Freq: Once | INTRAMUSCULAR | Status: AC
  Administered 2015-11-05: 4 mg via INTRAVENOUS
  Filled 2015-11-05: qty 2

## 2015-11-05 MED ORDER — NITROGLYCERIN 1 MG/10 ML FOR IR/CATH LAB
INTRA_ARTERIAL | Status: AC
  Filled 2015-11-05: qty 10

## 2015-11-05 MED ORDER — ONDANSETRON HCL 4 MG/2ML IJ SOLN
INTRAMUSCULAR | Status: AC
  Filled 2015-11-05: qty 2

## 2015-11-05 MED ORDER — NITROGLYCERIN IN D5W 200-5 MCG/ML-% IV SOLN
5.0000 ug/min | INTRAVENOUS | Status: DC
  Administered 2015-11-05: 5 ug/min via INTRAVENOUS
  Filled 2015-11-05: qty 250

## 2015-11-05 MED ORDER — IPRATROPIUM-ALBUTEROL 0.5-2.5 (3) MG/3ML IN SOLN
3.0000 mL | Freq: Once | RESPIRATORY_TRACT | Status: AC
  Administered 2015-11-05: 3 mL via RESPIRATORY_TRACT
  Filled 2015-11-05: qty 3

## 2015-11-05 MED ORDER — ONDANSETRON HCL 4 MG/2ML IJ SOLN: 4.0000 mg | Freq: Four times a day (QID) | INTRAMUSCULAR | Status: DC | PRN

## 2015-11-05 MED ORDER — PANTOPRAZOLE SODIUM 40 MG PO TBEC
40.0000 mg | DELAYED_RELEASE_TABLET | Freq: Every day | ORAL | Status: DC
  Administered 2015-11-05 – 2015-11-06 (×2): 40 mg via ORAL
  Filled 2015-11-05 (×2): qty 1

## 2015-11-05 MED ORDER — VENLAFAXINE HCL ER 150 MG PO CP24: 300.0000 mg | ORAL_CAPSULE | Freq: Every day | ORAL | Status: DC

## 2015-11-05 MED ORDER — FAMOTIDINE 20 MG PO TABS
20.0000 mg | ORAL_TABLET | Freq: Two times a day (BID) | ORAL | Status: DC
  Administered 2015-11-05 – 2015-11-06 (×2): 20 mg via ORAL
  Filled 2015-11-05 (×2): qty 1

## 2015-11-05 MED ORDER — LIDOCAINE HCL (PF) 1 % IJ SOLN
INTRAMUSCULAR | Status: AC
  Filled 2015-11-05: qty 30

## 2015-11-05 MED ORDER — CLOPIDOGREL BISULFATE 300 MG PO TABS
ORAL_TABLET | ORAL | Status: AC
  Filled 2015-11-05: qty 1

## 2015-11-05 MED ORDER — DOCUSATE SODIUM 100 MG PO CAPS
100.0000 mg | ORAL_CAPSULE | Freq: Two times a day (BID) | ORAL | Status: DC
  Administered 2015-11-05: 22:00:00 100 mg via ORAL
  Filled 2015-11-05: qty 1

## 2015-11-05 MED ORDER — HEPARIN SODIUM (PORCINE) 1000 UNIT/ML IJ SOLN
INTRAMUSCULAR | Status: DC | PRN
  Administered 2015-11-05: 6000 [IU] via INTRAVENOUS
  Administered 2015-11-05: 5000 [IU] via INTRAVENOUS
  Administered 2015-11-05 (×2): 2000 [IU] via INTRAVENOUS

## 2015-11-05 MED ORDER — VERAPAMIL HCL 2.5 MG/ML IV SOLN
INTRAVENOUS | Status: AC
  Filled 2015-11-05: qty 2

## 2015-11-05 MED ORDER — CYCLOBENZAPRINE HCL 10 MG PO TABS
5.0000 mg | ORAL_TABLET | Freq: Four times a day (QID) | ORAL | Status: DC | PRN
  Administered 2015-11-05 – 2015-11-06 (×2): 5 mg via ORAL
  Filled 2015-11-05 (×2): qty 1

## 2015-11-05 MED ORDER — LIDOCAINE HCL (PF) 1 % IJ SOLN
INTRAMUSCULAR | Status: DC | PRN
  Administered 2015-11-05: 4 mL

## 2015-11-05 MED ORDER — IOPAMIDOL (ISOVUE-370) INJECTION 76%
INTRAVENOUS | Status: DC | PRN
  Administered 2015-11-05: 145 mL via INTRA_ARTERIAL

## 2015-11-05 MED ORDER — ADENOSINE (DIAGNOSTIC) 140MCG/KG/MIN
INTRAVENOUS | Status: DC | PRN
  Administered 2015-11-05: 140 ug/kg/min via INTRAVENOUS

## 2015-11-05 MED ORDER — MOMETASONE FURO-FORMOTEROL FUM 100-5 MCG/ACT IN AERO
2.0000 | INHALATION_SPRAY | Freq: Two times a day (BID) | RESPIRATORY_TRACT | Status: DC
  Administered 2015-11-05 – 2015-11-06 (×2): 2 via RESPIRATORY_TRACT
  Filled 2015-11-05: qty 8.8

## 2015-11-05 MED ORDER — HEPARIN (PORCINE) IN NACL 2-0.9 UNIT/ML-% IJ SOLN
INTRAMUSCULAR | Status: DC | PRN
  Administered 2015-11-05: 1500 mL

## 2015-11-05 MED ORDER — HEPARIN (PORCINE) IN NACL 2-0.9 UNIT/ML-% IJ SOLN
INTRAMUSCULAR | Status: AC
  Filled 2015-11-05: qty 1500

## 2015-11-05 MED ORDER — QUETIAPINE FUMARATE 100 MG PO TABS
100.0000 mg | ORAL_TABLET | Freq: Two times a day (BID) | ORAL | Status: DC
  Filled 2015-11-05: qty 1

## 2015-11-05 MED ORDER — ONDANSETRON HCL 4 MG/2ML IJ SOLN
INTRAMUSCULAR | Status: DC | PRN
  Administered 2015-11-05: 4 mg via INTRAVENOUS

## 2015-11-05 MED ORDER — SODIUM CHLORIDE 0.9% FLUSH: 3.0000 mL | INTRAVENOUS | Status: DC | PRN

## 2015-11-05 SURGICAL SUPPLY — 21 items
BALLN TREK RX 2.5X12 (BALLOONS) ×3
BALLN ~~LOC~~ TREK RX 3.25X12 (BALLOONS) ×3
BALLOON TREK RX 2.5X12 (BALLOONS) IMPLANT
BALLOON ~~LOC~~ TREK RX 3.25X12 (BALLOONS) IMPLANT
CATH IMPULSE 5F ANG/FL3.5 (CATHETERS) ×2 IMPLANT
CATH VISTA GUIDE 6FR XBLAD3.5 (CATHETERS) ×2 IMPLANT
COVER PRB 48X5XTLSCP FOLD TPE (BAG) IMPLANT
COVER PROBE 5X48 (BAG) ×3
COVER SWIFTLINK CONNECTOR (BAG) IMPLANT
DEVICE RAD COMP TR BAND LRG (VASCULAR PRODUCTS) ×2 IMPLANT
GLIDESHEATH SLEND SS 6F .021 (SHEATH) ×2 IMPLANT
GUIDEWIRE PRESSURE COMET II (WIRE) ×2 IMPLANT
KIT ENCORE 26 ADVANTAGE (KITS) ×2 IMPLANT
KIT HEART LEFT (KITS) ×3 IMPLANT
PACK CARDIAC CATHETERIZATION (CUSTOM PROCEDURE TRAY) ×3 IMPLANT
STENT XIENCE ALPINE RX 3.0X18 (Permanent Stent) ×2 IMPLANT
SYR MEDRAD MARK V 150ML (SYRINGE) ×3 IMPLANT
TRANSDUCER W/STOPCOCK (MISCELLANEOUS) ×3 IMPLANT
TUBING CIL FLEX 10 FLL-RA (TUBING) ×3 IMPLANT
WIRE HI TORQ VERSACORE-J 145CM (WIRE) ×2 IMPLANT
WIRE SAFE-T 1.5MM-J .035X260CM (WIRE) ×2 IMPLANT

## 2015-11-05 NOTE — ED Notes (Signed)
Patient transported from X-ray to room 2

## 2015-11-05 NOTE — ED Provider Notes (Addendum)
TIME SEEN: 5:25 AM  CHIEF COMPLAINT: Shortness of breath, chest pain  HPI: Pt is a 60 y.o. male with history of CAD status post 3 stents, hypertension, COPD, continued tobacco abuse, bipolar disorder who presents to the emergency department with shortness of breath and chest pain.  Describes the pain as a central chest heaviness that radiates into his left arm and caused tingling in his left fingers. States pain started 1 hour prior to arrival while at rest. No known aggravating factors. He states he took 2 nitroglycerin at home which did help his pain but did not completely resolve it. Has had diaphoresis, nausea and dizziness. No vomiting. No fever. Has chronic cough that is unchanged. Also wheezes chronically but he states he does not feel like this is his COPD. States that he feels like this is similar to his prior heart attacks. States his cardiologist was previously with [facility] in [place] but he no longer sees this physician. States his last stress test was over 2 years ago. Reports his last cardiac catheterization was many years ago. Denies history of PE or DVT.  ROS: See HPI Constitutional: no fever  Eyes: no drainage  ENT: no runny nose   Cardiovascular:   chest pain  Resp:  SOB  GI: no vomiting GU: no dysuria Integumentary: no rash  Allergy: no hives  Musculoskeletal: no leg swelling  Neurological: no slurred speech ROS otherwise negative  PAST MEDICAL HISTORY/PAST SURGICAL HISTORY:  Past Medical History:  Diagnosis Date  . Additional heart attack   . Asthma   . Bipolar 1 disorder ([place])   . COPD (chronic obstructive pulmonary disease) ([place])   . Heart attack ([place])   . Hypertension   . Hypothyroidism     MEDICATIONS:  Prior to Admission medications   Medication Sig Start Date End Date Taking? Authorizing Provider  acetaminophen (TYLENOL) 500 MG chewable tablet Chew 1,000 mg by mouth every 6 (six) hours as needed. For pain    Historical Provider, MD  aspirin 325 MG  tablet Take 325 mg by mouth daily.    Historical Provider, MD  cyclobenzaprine (FLEXERIL) 5 MG tablet Take 1 tablet (5 mg total) by mouth every 6 (six) hours as needed (for back pain). 09/25/14   Reyne Dumas, MD  docusate sodium (STOOL SOFTENER) 100 MG capsule Take 100 mg by mouth 2 (two) times daily.    Historical Provider, MD  doxycycline (VIBRAMYCIN) 100 MG capsule Take 1 capsule (100 mg total) by mouth 2 (two) times daily. 10/23/14   Merryl Hacker, MD  famotidine (PEPCID) 20 MG tablet Take 1 tablet (20 mg total) by mouth 2 (two) times daily. 12/17/14   Charlesetta Shanks, MD  fluticasone-salmeterol (ADVAIR HFA) [phone] MCG/ACT inhaler Inhale 1 puff into the lungs 2 (two) times daily.     Historical Provider, MD  hydrOXYzine (VISTARIL) 25 MG capsule Take 50 mg by mouth at bedtime as needed. 09/02/14   Historical Provider, MD  levothyroxine (SYNTHROID, LEVOTHROID) 25 MCG tablet Take 25 mcg by mouth daily.    Historical Provider, MD  lisinopril (PRINIVIL,ZESTRIL) 10 MG tablet Take 10 mg by mouth daily. 07/02/14 07/02/15  Historical Provider, MD  Multiple Vitamin (MULTI-VITAMINS) TABS Take 1 tablet by mouth daily.    Historical Provider, MD  nitroGLYCERIN (NITROSTAT) 0.4 MG SL tablet Place 0.4 mg under the tongue every 5 (five) minutes as needed for chest pain.     Historical Provider, MD  omeprazole (PRILOSEC) 20 MG capsule Take 20 mg by mouth  2 (two) times daily. 09/19/14   Historical Provider, MD  ondansetron (ZOFRAN ODT) 4 MG disintegrating tablet '4mg'$  ODT q4 hours prn nausea/vomit 12/17/14   Charlesetta Shanks, MD  Oxycodone HCl 10 MG TABS Take 10 mg by mouth every 4 (four) hours as needed (for pain).  09/10/14   Historical Provider, MD  polyethylene glycol (MIRALAX / GLYCOLAX) packet Take 17 g by mouth daily as needed for mild constipation.     Historical Provider, MD  predniSONE (DELTASONE) 20 MG tablet Take 2 tablets (40 mg total) by mouth daily with breakfast. 10/23/14   Merryl Hacker, MD  promethazine  (PHENERGAN) 25 MG tablet Take 1 tablet (25 mg total) by mouth every 6 (six) hours as needed for nausea or vomiting. 10/23/14   Merryl Hacker, MD  promethazine (PHENERGAN) 25 MG tablet Take 1 tablet (25 mg total) by mouth every 6 (six) hours as needed for nausea or vomiting. 12/18/14   Charlesetta Shanks, MD  QUEtiapine (SEROQUEL) 100 MG tablet Take 100 mg by mouth 2 (two) times daily. 09/12/14   Historical Provider, MD  simvastatin (ZOCOR) 20 MG tablet Take 20 mg by mouth every evening.    Historical Provider, MD  temazepam (RESTORIL) 30 MG capsule Take 30 mg by mouth daily. 09/23/14   Historical Provider, MD  traZODone (DESYREL) 100 MG tablet Take 200 mg by mouth at bedtime.    Historical Provider, MD  venlafaxine (EFFEXOR) 100 MG tablet Take 300 mg by mouth daily.    Historical Provider, MD    ALLERGIES:  Allergies  Allergen Reactions  . Rocephin [Ceftriaxone Sodium In Dextrose] Anaphylaxis  . Prochlorperazine Edisylate Other (See Comments)    Childhood Reaction.  . Toradol [Ketorolac Tromethamine] Nausea And Vomiting    Pt states he is not allergic to ibuprofen  . Tramadol Nausea And Vomiting    SOCIAL HISTORY:  Social History  Substance Use Topics  . Smoking status: Current Every Day Smoker  . Smokeless tobacco: Never Used  . Alcohol use No    FAMILY HISTORY: No family history on file.  EXAM: BP 118/87 (BP Location: Left Arm)   Temp 98.3 F (36.8 C) (Oral)   Resp 20   Ht 6' (1.829 m)   Wt 250 lb (113.4 kg)   SpO2 100%   BMI 33.91 kg/m  CONSTITUTIONAL: Alert and oriented and responds appropriately to questions. Chronically ill-appearing, obese, appears uncomfortable, slightly diaphoretic, Clutching at his chest HEAD: Normocephalic EYES: Conjunctivae clear, PERRL ENT: normal nose; no rhinorrhea; moist mucous membranes NECK: Supple, no meningismus, no LAD  CARD: RRR; S1 and S2 appreciated; no murmurs, no clicks, no rubs, no gallops RESP: Normal chest excursion without  splinting or tachypnea; breath sounds equal bilaterally, slightly diminished at bases bilaterally, diffuse expiratory wheezing, no rhonchi, no rales, no hypoxia or respiratory distress, speaking full sentences ABD/GI: Normal bowel sounds; non-distended; soft, non-tender, no rebound, no guarding, no peritoneal signs BACK:  The back appears normal and is non-tender to palpation, there is no CVA tenderness EXT: Normal ROM in all joints; non-tender to palpation; no edema; normal capillary refill; no cyanosis, no calf tenderness or swelling, 2+ radial and DP pulses bilaterally    SKIN: Normal color for age and race; warm; no rash NEURO: Moves all extremities equally, sensation to light touch intact diffusely, cranial nerves II through XII intact PSYCH: The patient's mood and manner are appropriate. Grooming and personal hygiene are appropriate.  MEDICAL DECISION MAKING: Patient here with chest pain. Has history  of CAD as well as COPD. Suspect this could be a combination of both. EKG shows no new ischemic abnormality. We'll obtain cardiac labs, chest x-ray. We'll give DuoNeb, Solu-Medrol and reassess. We'll give aspirin and start a nitroglycerin drip. I feel he will need admission. He would like to go to [facility]. Has no history of PE or DVT. Pain is not pleuritic. He is not tachycardic, tachypneic or hypoxic. Denies tearing sensation, back pain or abdominal pain. No neurologic deficits. Equal pulses in all extremities. He is not hypertensive. No history of aneurysm. Last CT scan was one year ago. Doubt dissection at this time. I am concerned for angina in this patient.  ED PROGRESS: 6:30 AM  Patient is having some improvement in pain with nitroglycerin drip. We'll give morphine, Zofran as well. First troponin negative. BNP normal. Repeat EKG shows no significant change. We'll also start heparin. I feel he will need to be seen by cardiology. It appears the last time he was admitted for chest pain was  in August 2016. He had negative troponins then and was supposed to have an echocardiogram but it doesn't appear he had one. Last echocardiogram in our system was in 2010. Last stress test was in 2006. Was supposed to follow-up with a cardiologist as an outpatient after his last admission but reports he did not.  His wheezing and aeration did improve after 1 DuoNeb. We'll give a second.  Patient's chest x-ray shows no cardiomegaly, no widened mediastinum, no edema, infiltrate, pneumothorax, pleural effusion. We'll discuss with cardiology at [facility] for admission. I feel he'll need admission to step down given we are unable to get him chest pain-free at this time.   6:40 AM  On further review of OSH records, it appears patient had an echocardiogram in May 2017 which showed an EF of 55%, mild aortic regurgitation. He also appears to have had a stress echocardiogram in September 2016 which showed no significant ST changes during stress and no arrhythmias during the test. Also documented that there were no symptoms during the procedure.  7:15 AM  D/w Dr. Meda Coffee with cardiology who agrees on excepting patient for admission to inpatient, stepdown bed. Patient's pain is still improving and he is still hemodynamically stable but he is not yet pain-free. Patient has received full dose aspirin. Heparin started as well.     EKG Interpretation  Date/Time:  Thursday November 05 2015 05:23:14 EDT Ventricular Rate:  84 PR Interval:    QRS Duration: 108 QT Interval:  389 QTC Calculation: 460 R Axis:   62 Text Interpretation:  Sinus rhythm Incomplete left bundle branch block Low voltage with right axis deviation Minimal ST elevation, lateral leads Baseline wander in lead(s) V1 V2 V6 No significant change since last tracing Confirmed by WARD,  DO, KRISTEN [phone]) on 11/05/2015 5:29:29 AM        EKG Interpretation  Date/Time:  Thursday November 05 2015 05:29:36 EDT Ventricular Rate:  82 PR  Interval:    QRS Duration: 88 QT Interval:  372 QTC Calculation: 435 R Axis:   56 Text Interpretation:  Sinus rhythm Low voltage, precordial leads No significant change since last tracing Confirmed by WARD,  DO, KRISTEN [phone]) on 11/05/2015 6:23:24 AM        EKG Interpretation  Date/Time:  Thursday November 05 2015 06:21:33 EDT Ventricular Rate:  83 PR Interval:    QRS Duration: 81 QT Interval:  371 QTC Calculation: 436 R Axis:   60 Text  Interpretation:  Sinus rhythm Low voltage, precordial leads Consider anterior infarct No significant change since last tracing Confirmed by WARD,  DO, KRISTEN ([id number]) on 11/05/2015 6:24:47 AM           CRITICAL CARE Performed by: Nyra Jabs   Total critical care time: 45 minutes  Critical care time was exclusive of separately billable procedures and treating other patients.  Critical care was necessary to treat or prevent imminent or life-threatening deterioration.  Critical care was time spent personally by me on the following activities: development of treatment plan with patient and/or surrogate as well as nursing, discussions with consultants, evaluation of patient's response to treatment, examination of patient, obtaining history from patient or surrogate, ordering and performing treatments and interventions, ordering and review of laboratory studies, ordering and review of radiographic studies, pulse oximetry and re-evaluation of patient's condition.    [place], DO 11/05/15 [place], DO 11/05/15 0301

## 2015-11-05 NOTE — Interval H&P Note (Signed)
History and Physical Interval Note:  11/05/2015 2:38 PM  [name]  has presented today for cardiac catheterization, with the diagnosis of unstable angina.  The various methods of treatment have been discussed with the patient and family. After consideration of risks, benefits and other options for treatment, the patient has consented to  Procedure(s): Left Heart Cath and Coronary Angiography (N/A) as a surgical intervention .  The patient's history has been reviewed, patient examined, no change in status, stable for surgery.  I have reviewed the patient's chart and labs.  Questions were answered to the patient's satisfaction.    Cath Lab Visit (complete for each Cath Lab visit)  Clinical Evaluation Leading to the Procedure:   ACS: Yes.    Non-ACS:    Anginal Classification: CCS IV  Anti-ischemic medical therapy: No Therapy  Non-Invasive Test Results: No non-invasive testing performed  Prior CABG: No previous CABG        [name]

## 2015-11-05 NOTE — ED Notes (Signed)
Patient transported to X-ray 

## 2015-11-05 NOTE — Progress Notes (Signed)
Pt admitted to the floor from [facility].  Pt having 5/10 stabbing chest pain, pt is also short of breath and diaphoretic.  VSS and EKG obtained.  Suanne Marker, [place] with cardiology notified for orders.  Will continue to closely monitor.

## 2015-11-05 NOTE — Progress Notes (Signed)
[place] for heparin Indication: chest pain/ACS  Allergies  Allergen Reactions  . Rocephin [Ceftriaxone Sodium In Dextrose] Anaphylaxis  . Prochlorperazine Edisylate Other (See Comments)    Childhood Reaction.  . Toradol [Ketorolac Tromethamine] Nausea And Vomiting    Pt states he is not allergic to ibuprofen  . Tramadol Nausea And Vomiting    Patient Measurements: Height: 6' (182.9 cm) Weight: 250 lb (113.4 kg) IBW/kg (Calculated) : 77.6 Heparin Dosing Weight: 102 kg  Vital Signs: Temp: 98.3 F (36.8 C) (09/14 0525) Temp Source: Oral (09/14 0525) BP: 118/87 (09/14 0525) Pulse Rate: 86 (09/14 0618)  Labs:  Recent Labs  11/05/15 0530  HGB 15.2  HCT 43.2  PLT 242  CREATININE 0.73  TROPONINI <0.03    Estimated Creatinine Clearance: 126 mL/min (by C-G formula based on SCr of 0.73 mg/dL).   Medications:  See EMR  Assessment: Heparin gtt for patient with acute chest pain. CBC stable, Scr 0.7. No anticoagulants prior to admission. Initial troponin is negative.   Goal of Therapy:  Heparin level 0.3-0.7 units/ml Monitor platelets by anticoagulation protocol: Yes   Plan:  -heparin bolus 4000 units x1 then 1200 units/hr -daily HL, CBC -Check confirmatory level     Harvel Quale 11/05/2015,6:21 AM

## 2015-11-05 NOTE — H&P (Signed)
History and Physical   Patient ID: [name] MRN: [id number], DOB/AGE: 1955-08-23 60 y.o. Date of Encounter: 11/05/2015  Primary Physician: Beckie Salts, MD Primary Cardiologist: Dr Beatrix Fetters  Chief Complaint:  Chest pain  HPI: [name] is a 60 y.o. 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  Eval for CP 2016 w/ neg DBA echo, EF normal. Saw Dr Beatrix Fetters last 08/2014.   Pt woke today with chest pain, diaphoresis, nausea and SOB. It was a squeezing pain, substernal, 9/10. It radiated to both arms with numbness. NTG x 2 at home helped a little, 7/10. When that did not work, he went to [facility]. He was given ASA 324 mg, heparin, Duoneb, steroids, Zofran, nitro gtt, and morphine totaling 12 mg. The pain is now down to a 5/10. Initial ez negative and ECG not acute.   He gets chest pain with coughing at times, but this does not remind him of his heart. It is more like his heart attack in 2010 and his admission in 2016, when a stress test was negative.    Past Medical History:  Diagnosis Date  . Asthma   . Bipolar 1 disorder ([place])   . COPD (chronic obstructive pulmonary disease) ([place])   . Heart attack ([place]) 2010   BMS x 2 RCA  . Hypertension   . Hypothyroidism     Surgical History:  Past Surgical History:  Procedure Laterality Date  . APPENDECTOMY    . CHOLECYSTECTOMY    . CORONARY ANGIOPLASTY WITH STENT PLACEMENT  2010   90% RCA s/p BMS x 2, 50% LAD, 30% CFX, EF 45-50%  . HERNIA REPAIR    . KNEE SURGERY    . TONSILLECTOMY       I have reviewed the patient's current medications. Prior to Admission medications   Medication Sig  aspirin 325 MG tablet Take 325 mg by mouth daily.  docusate sodium (STOOL SOFTENER) 100 MG capsule Take 100 mg by mouth 2 (two) times daily.  famotidine (PEPCID) 20 MG tablet Take 1 tablet  (20 mg total) by mouth 2 (two) times daily.  fluticasone-salmeterol (ADVAIR HFA) 45-21 MCG/ACT inhaler Inhale 1 puff into the lungs 2 (two) times daily.   levothyroxine (SYNTHROID, LEVOTHROID) 25 MCG tablet Take 25 mcg by mouth daily.  lisinopril (PRINIVIL,ZESTRIL) 10 MG tablet Take 10 mg by mouth daily.  Multiple Vitamin (MULTI-VITAMINS) TABS Take 1 tablet by mouth daily.  nitroGLYCERIN (NITROSTAT) 0.4 MG SL tablet Place 0.4 mg under the tongue every 5 (five) minutes as needed for chest pain.   omeprazole (PRILOSEC) 20 MG capsule Take 20 mg by mouth 2 (two) times daily.  Oxycodone HCl 10 MG TABS Take 10 mg by mouth every 4 (four) hours as needed (for pain).   polyethylene glycol (MIRALAX / GLYCOLAX) packet Take 17 g by mouth daily as needed for mild constipation.   QUEtiapine (SEROQUEL) 100 MG tablet Take 100 mg by mouth 2 (two) times daily.  simvastatin (ZOCOR) 20 MG tablet Take 20 mg by mouth every evening.  temazepam (RESTORIL) 30 MG capsule Take 30 mg by mouth daily.  venlafaxine (EFFEXOR) 100 MG tablet Take 300 mg by mouth daily.  acetaminophen (TYLENOL) 500 MG chewable tablet Chew 1,000 mg by mouth every 6 (six) hours as needed. For pain  cyclobenzaprine (FLEXERIL) 5 MG tablet  Take 1 tablet (5 mg total) by mouth every 6 (six) hours as needed (for back pain).  doxycycline (VIBRAMYCIN) 100 MG capsule Take 1 capsule (100 mg total) by mouth 2 (two) times daily.  hydrOXYzine (VISTARIL) 25 MG capsule Take 50 mg by mouth at bedtime as needed.  ondansetron (ZOFRAN ODT) 4 MG disintegrating tablet '4mg'$  ODT q4 hours prn nausea/vomit  predniSONE (DELTASONE) 20 MG tablet Take 2 tablets (40 mg total) by mouth daily with breakfast.  promethazine (PHENERGAN) 25 MG tablet Take 1 tablet (25 mg total) by mouth every 6 (six) hours as needed for nausea or vomiting.  promethazine (PHENERGAN) 25 MG tablet Take 1 tablet (25 mg total) by mouth every 6 (six) hours as needed for nausea or vomiting.  traZODone  (DESYREL) 100 MG tablet Take 200 mg by mouth at bedtime.   Scheduled Meds: . ipratropium-albuterol  3 mL Nebulization Q4H   Continuous Infusions: . heparin 1,200 Units/hr (11/05/15 0705)  . nitroGLYCERIN Stopped (11/05/15 8588)   Allergies:  Allergies  Allergen Reactions  . Rocephin [Ceftriaxone Sodium In Dextrose] Anaphylaxis  . Prochlorperazine Edisylate Other (See Comments)    Childhood Reaction.  . Toradol [Ketorolac Tromethamine] Nausea And Vomiting    Pt states he is not allergic to ibuprofen  . Tramadol Nausea And Vomiting    Social History   Social History  . Marital status: Divorced    Spouse name: N/A  . Number of children: N/A  . Years of education: N/A   Occupational History  . Disabled    Social History Main Topics  . Smoking status: Current Every Day Smoker    Packs/day: 0.50  . Smokeless tobacco: Never Used  . Alcohol use No  . Drug use: No  . Sexual activity: Not on file   Other Topics Concern  . Not on file   Social History Narrative   Lives alone in [place].    Family History  Problem Relation Age of Onset  . Heart Problems Mother     77  . Heart disease Father     26   Family Status  Relation Status  . Mother Deceased  . Father Deceased  . Maternal Grandmother Deceased  . Maternal Grandfather Deceased  . Paternal Grandmother Deceased  . Paternal Grandfather Deceased    Review of Systems:   Full 14-point review of systems otherwise negative except as noted above.  Physical Exam: Blood pressure 129/87, pulse 82, temperature 99.8 F (37.7 C), temperature source Oral, resp. rate 14, height 6' (1.829 m), weight 252 lb 8 oz (114.5 kg), SpO2 100 %. General: Well developed, well nourished,male in no acute distress. Head: Normocephalic, atraumatic, sclera non-icteric, no xanthomas, nares are without discharge. Dentition: poor Neck: No carotid bruits. JVD not elevated. No thyromegally Lungs: Good expansion bilaterally. without wheezes  or rhonchi. No tenderness Heart: Regular rate and rhythm with S1 S2.  No S3 or S4.  No murmur, no rubs, or gallops appreciated. Abdomen: Soft, non-tender, non-distended with normoactive bowel sounds. No hepatomegaly. No rebound/guarding. No obvious abdominal masses. Msk:  Strength and tone appear normal for age. No joint deformities or effusions, no spine or costo-vertebral angle tenderness. Extremities: No clubbing or cyanosis. No edema.  Distal pedal pulses are 2+ in 4 extrem Neuro: Alert and oriented X 3. Moves all extremities spontaneously. No focal deficits noted. Psych:  Responds to questions appropriately with a normal affect. Skin: No rashes or lesions noted  Labs:   Lab Results  Component Value Date  WBC 12.6 (H) 11/05/2015   HGB 15.2 11/05/2015   HCT 43.2 11/05/2015   MCV 84.0 11/05/2015   PLT 242 11/05/2015      Recent Labs Lab 11/05/15 0530  NA 135  K 4.0  CL 101  CO2 25  BUN 13  CREATININE 0.73  CALCIUM 9.3  GLUCOSE 100*    Recent Labs  11/05/15 0530 11/05/15 0830  TROPONINI <0.03 <0.03   Lab Results  Component Value Date   CHOL  11/20/2008    162        ATP III CLASSIFICATION:  <200     mg/dL   Desirable  200-239  mg/dL   Borderline High  >=240    mg/dL   High          HDL 25 (L) 11/20/2008   LDLCALC  11/20/2008    UNABLE TO CALCULATE IF TRIGLYCERIDE OVER 400 mg/dL          TRIG 436 (H) 11/20/2008   B Natriuretic Peptide  Date/Time Value Ref Range Status  11/05/2015 05:30 AM 6.0 0.0 - 100.0 pg/mL Final  09/24/2014 11:50 AM 50.4 0.0 - 100.0 pg/mL Final    Radiology/Studies: Dg Chest 2 View  Result Date: 11/05/2015 CLINICAL DATA:  Chest pain and shortness of breath EXAM: CHEST  2 VIEW COMPARISON:  10/28/2015 FINDINGS: Cardiomediastinal contours are unchanged. No focal airspace consolidation or pulmonary edema. No pneumothorax or pleural effusion. IMPRESSION: No active cardiopulmonary disease. Electronically Signed   By: Ulyses Jarred M.D.    On: 11/05/2015 06:34   Echo: 2010 1. Left ventricle: The cavity size was normal. Wall thickness was  normal. Systolic function was mildly reduced. The estimated  ejection fraction was in the range of 45% to 50%. The inferior  and posterior walls appear hypokinetic. Doppler parameters are  consistent with abnormal left ventricular relaxation (grade 1  diastolic dysfunction). 2. Aortic valve: There was no stenosis. 3. Mitral valve: No significant regurgitation. 4. Right ventricle: The cavity size was normal. Systolic function  was normal. 5. Pulmonary arteries: No TR doppler jet so unable to estimate PA  systolic pressure. 6. Systemic veins: The IVC was not visualized. Impressions: - Technically difficult study with poor acoustic windows. Normal LV  size with mildly decreased systolic function, EF 14-78%. The  inferior and posterior walls are not well visualized but appear  hypokinetic.  ECG: 09/14 SR, inferior Q waves, minor changes from 2016  ASSESSMENT AND PLAN:  Active Problems:   Chest pain - per pt, this is like his MI - last time he had pain like this, his DBA was negative.  - however, risk factors are not well-controlled and he had residual LAD 50% and CFX 30% in 2010 - with ongoing pain, mult CRFs and +personal hx CAD, consider cath - assess for CRF control and continue home rx.   Augusto Garbe 11/05/2015 1:41 PM Beeper [phone]  Agree with note by Rosaria Ferries PA-C  Pt with H/O CAD s/p Inf STEMI 2010 Rx with PCI/ stent RCA. + CRF with ongoing tob, HTN, HLD. Pt awoke with CP this AM assoc with diaphoresis, SOB and LUE radiation. He has been given SL---> IV NTG as well as MSO4. Currently srtill having CP. Exam beign, labs OK, trop neg, EKG w/o acute changes. Will need urgent cath. The patient understands that risks included but are not limited to stroke (1 in 1000), death (1 in 13), kidney failure [usually  temporary] (1 in 500), bleeding (1 in  200), allergic reaction [possibly serious] (1 in 200). The patient understands and agrees to proceed  Lorretta Harp, M.D., [place], [facility], [place], [place] [address] [place], [place]  [address]  [phone] 11/05/2015 2:09 PM

## 2015-11-05 NOTE — ED Notes (Signed)
Dr. Audie Pinto notified of pt's BP 89/53 and chest pain. NTG gtt stopped. See orders and VS record

## 2015-11-05 NOTE — ED Notes (Signed)
Peripheral IV attempted in Right wrist by Sherri Rad. IV unsuccessful. Crystal RRT at bedside for secondary attempt.

## 2015-11-05 NOTE — ED Triage Notes (Signed)
Patient states that he woke up about an hour ago with acute SOB and multiple other complaints including Chest pain. Patient with history of MI in the past

## 2015-11-05 NOTE — ED Notes (Signed)
Report given to [place] and Joss B. RN pt condition improved

## 2015-11-05 NOTE — ED Notes (Signed)
Dr Audie Pinto aware of pt's VS since pausing NTG gtt. VORB to D/C NTG gtt received

## 2015-11-05 NOTE — Progress Notes (Signed)
TR BAND REMOVAL  LOCATION:    right radial  DEFLATED PER PROTOCOL:    Yes.    TIME BAND OFF / DRESSING APPLIED:    2130   SITE UPON ARRIVAL:    Level 0  SITE AFTER BAND REMOVAL:    Level 0  CIRCULATION SENSATION AND MOVEMENT:    Within Normal Limits   Yes.    COMMENTS:   Pt tolerated well; radial site care teaching done and pt verbalizes understanding.

## 2015-11-06 ENCOUNTER — Encounter (HOSPITAL_COMMUNITY): Payer: Self-pay | Admitting: Internal Medicine

## 2015-11-06 DIAGNOSIS — Z7952 Long term (current) use of systemic steroids: Secondary | ICD-10-CM | POA: Diagnosis not present

## 2015-11-06 DIAGNOSIS — F319 Bipolar disorder, unspecified: Secondary | ICD-10-CM | POA: Diagnosis present

## 2015-11-06 DIAGNOSIS — E039 Hypothyroidism, unspecified: Secondary | ICD-10-CM | POA: Diagnosis present

## 2015-11-06 DIAGNOSIS — Z955 Presence of coronary angioplasty implant and graft: Secondary | ICD-10-CM | POA: Diagnosis not present

## 2015-11-06 DIAGNOSIS — Z23 Encounter for immunization: Secondary | ICD-10-CM | POA: Diagnosis not present

## 2015-11-06 DIAGNOSIS — E785 Hyperlipidemia, unspecified: Secondary | ICD-10-CM | POA: Diagnosis present

## 2015-11-06 DIAGNOSIS — Z6833 Body mass index (BMI) 33.0-33.9, adult: Secondary | ICD-10-CM | POA: Diagnosis not present

## 2015-11-06 DIAGNOSIS — E669 Obesity, unspecified: Secondary | ICD-10-CM | POA: Diagnosis present

## 2015-11-06 DIAGNOSIS — I2584 Coronary atherosclerosis due to calcified coronary lesion: Secondary | ICD-10-CM | POA: Diagnosis present

## 2015-11-06 DIAGNOSIS — F17210 Nicotine dependence, cigarettes, uncomplicated: Secondary | ICD-10-CM | POA: Diagnosis present

## 2015-11-06 DIAGNOSIS — J449 Chronic obstructive pulmonary disease, unspecified: Secondary | ICD-10-CM | POA: Diagnosis present

## 2015-11-06 DIAGNOSIS — R079 Chest pain, unspecified: Secondary | ICD-10-CM | POA: Diagnosis present

## 2015-11-06 DIAGNOSIS — I2510 Atherosclerotic heart disease of native coronary artery without angina pectoris: Secondary | ICD-10-CM | POA: Diagnosis not present

## 2015-11-06 DIAGNOSIS — I209 Angina pectoris, unspecified: Secondary | ICD-10-CM | POA: Diagnosis not present

## 2015-11-06 DIAGNOSIS — Z7951 Long term (current) use of inhaled steroids: Secondary | ICD-10-CM | POA: Diagnosis not present

## 2015-11-06 DIAGNOSIS — N189 Chronic kidney disease, unspecified: Secondary | ICD-10-CM | POA: Diagnosis present

## 2015-11-06 DIAGNOSIS — Z7982 Long term (current) use of aspirin: Secondary | ICD-10-CM | POA: Diagnosis not present

## 2015-11-06 DIAGNOSIS — I129 Hypertensive chronic kidney disease with stage 1 through stage 4 chronic kidney disease, or unspecified chronic kidney disease: Secondary | ICD-10-CM | POA: Diagnosis present

## 2015-11-06 DIAGNOSIS — Z95820 Peripheral vascular angioplasty status with implants and grafts: Secondary | ICD-10-CM

## 2015-11-06 DIAGNOSIS — I25110 Atherosclerotic heart disease of native coronary artery with unstable angina pectoris: Secondary | ICD-10-CM | POA: Diagnosis present

## 2015-11-06 DIAGNOSIS — Z79899 Other long term (current) drug therapy: Secondary | ICD-10-CM | POA: Diagnosis not present

## 2015-11-06 DIAGNOSIS — I252 Old myocardial infarction: Secondary | ICD-10-CM | POA: Diagnosis not present

## 2015-11-06 HISTORY — DX: Peripheral vascular angioplasty status with implants and grafts: Z95.820

## 2015-11-06 LAB — CBC
HCT: 38.9 % — ABNORMAL LOW (ref 39.0–52.0)
Hemoglobin: 12.7 g/dL — ABNORMAL LOW (ref 13.0–17.0)
MCH: 28.2 pg (ref 26.0–34.0)
MCHC: 32.6 g/dL (ref 30.0–36.0)
MCV: 86.4 fL (ref 78.0–100.0)
PLATELETS: 187 10*3/uL (ref 150–400)
RBC: 4.5 MIL/uL (ref 4.22–5.81)
RDW: 14.9 % (ref 11.5–15.5)
WBC: 13.4 10*3/uL — AB (ref 4.0–10.5)

## 2015-11-06 LAB — BASIC METABOLIC PANEL
Anion gap: 9 (ref 5–15)
BUN: 13 mg/dL (ref 6–20)
CHLORIDE: 105 mmol/L (ref 101–111)
CO2: 21 mmol/L — ABNORMAL LOW (ref 22–32)
CREATININE: 0.81 mg/dL (ref 0.61–1.24)
Calcium: 8.9 mg/dL (ref 8.9–10.3)
Glucose, Bld: 112 mg/dL — ABNORMAL HIGH (ref 65–99)
POTASSIUM: 4.2 mmol/L (ref 3.5–5.1)
SODIUM: 135 mmol/L (ref 135–145)

## 2015-11-06 MED ORDER — ATORVASTATIN CALCIUM 80 MG PO TABS: 80.0000 mg | ORAL_TABLET | Freq: Every day | ORAL | 6 refills | Status: DC

## 2015-11-06 MED ORDER — LISINOPRIL 10 MG PO TABS: 10.0000 mg | ORAL_TABLET | Freq: Every day | ORAL | 11 refills | Status: DC

## 2015-11-06 MED ORDER — CLOPIDOGREL BISULFATE 75 MG PO TABS: 75.0000 mg | ORAL_TABLET | Freq: Every day | ORAL | 11 refills | Status: DC

## 2015-11-06 MED ORDER — PANTOPRAZOLE SODIUM 40 MG PO TBEC: 40.0000 mg | DELAYED_RELEASE_TABLET | Freq: Every day | ORAL | 11 refills | Status: DC

## 2015-11-06 MED ORDER — ASPIRIN 81 MG PO CHEW: 81.0000 mg | CHEWABLE_TABLET | Freq: Every day | ORAL | Status: DC

## 2015-11-06 NOTE — Care Management Note (Signed)
Case Management Note  Patient Details  Name: [name] MRN: [id number] Date of Birth: 29-Aug-1955  Subjective/Objective:     Patient is s/p coroanary intervention, on plavix and asa.  Patient dc today, no needs.               Action/Plan:   Expected Discharge Date:                  Expected Discharge Plan:  Home/Self Care  In-House Referral:     Discharge planning Services  CM Consult  Post Acute Care Choice:    Choice offered to:     DME Arranged:    DME Agency:     HH Arranged:    HH Agency:     Status of Service:  Completed, signed off  If discussed at [employer] of Stay Meetings, dates discussed:    Additional Comments:  Zenon Mayo, RN 11/06/2015, 9:11 AM

## 2015-11-06 NOTE — Discharge Instructions (Signed)
Call [facility] at [phone] if any bleeding, swelling or drainage at cath site.  May shower, no tub baths for 48 hours for groin sticks. No lifting over 5 pounds for 3 days.  No Driving for 3 days.    Heart Healthy Diet  DO NOT stop plavix and asprin, these meds keep the stent open -  If you stop you could have a heart attack.  Follow up with Dr. Stanford Breed at the [facility] office.

## 2015-11-06 NOTE — Progress Notes (Signed)
CARDIAC REHAB PHASE I   PRE:  Rate/Rhythm: 79 SR  BP:  Supine: 139/78  Sitting:   Standing:    SaO2: 97%RA  MODE:  Ambulation: 100 ft   POST:  Rate/Rhythm: 91 SR  BP:  Supine:   Sitting: 146/96  Standing:    SaO2: 97%RA 0805-0902 Pt stated chest tightness 4 on scale 1-10 in bed. Pt stated MD aware that he has been having chest tightness. Pt walked 100 ft with rolling walker and asst x 1 with steady gait. Chest tightness to 5 with walk and back to 4 after back to bed. Reviewed NTG use, heart healthy diet, plavix with stent and modified ex ed. Pt limited in walking due to orthopedic issues. Discussed CRP 2 and will refer to [facility]. Discussed smoking cessation . Pt has gone from 2ppd to 6 cigarettes a day and is going to keep cutting down until he quits. Gave smoking cessation handout. Did not want fake cigarette. Pt stated he has lost over 50 pounds eating healthier.   Graylon Good, RN BSN  11/06/2015 8:57 AM

## 2015-11-06 NOTE — Discharge Summary (Signed)
Physician Discharge Summary       Patient ID: [name] MRN: [id number] DOB/AGE: 26-Jul-1955 60 y.o.  Admit date: 11/05/2015 Discharge date: 11/06/2015 Primary Cardiologist:Dr. Olevia Perches   Now Dr. Stanford Breed    Discharge Diagnoses:  Principal Problem:   Ischemic chest pain [facility]) Active Problems:   S/P angioplasty with stent 11/05/15 PCI DES to mLAD    Hyperlipidemia LDL goal <70   BIPOLAR DISORDER UNSPECIFIED   Coronary atherosclerosis   Chest pain   Discharged Condition: good  Procedures: 11/05/15  By Dr. Saunders Revel  Procedures   Coronary Stent Intervention  Intravascular Pressure Wire/FFR Study  Left Heart Cath and Coronary Angiography  Conclusion   1. Multivessel coronary artery disease, including 40% ostial/proximal and 50-60% mid LAD stenoses (FFR 0.66), 60% ostial OM1 stenosis, diffuse disease of the mid RCA up to 40% in the distal vessel. 2. Patent stents in the ostial, proximal, and mid RCA with mild in-stent restenosis up to 20%. 3. Normal left ventricular contraction LVEF>65%). 4. Normal left ventricular filling pressure (LVEDP 12 mmHg). 5.  Successful FFR-guided PCI to mid LAD with placement of a Xience Alpine 3.0 x 18 mm drug-eluting stent with 0% residual stenosis and TIMI-3 flow.  Recommendations: 1.  Dual antiplatelet therapy with aspirin and clopidogrel for 12 months. 2.  Aggressive secondary prevention; will increase statin to atorvastatin 80 mg daily. 3.  Medical management of chest pain and evaluation for potential non-coronary etiologies of pain, given lack of obstructive CAD at the end of the procedure and normal EKG.   Coronary Findings   Dominance: Right  Left Main  Vessel is large.  Left Anterior Descending  Vessel is large.  Ost LAD lesion, 40% stenosed. The lesion is calcified.  Mid LAD lesion, 55% stenosed. Culprit lesion. The lesion is calcified. Pressure wire/FFR was performed on the lesion. FFR: 0.66.  Angioplasty: Lesion length: 15 mm. Lesion  crossed with guidewire using a GUIDEWIRE COMET PRESSURE. Pre-stent angioplasty was performed using a BALLOON TREK RX 2.5X12. Maximum pressure: 12 atm. A STENT XIENCE ALPINE RX 3.0X18 drug eluting stent was successfully placed. Minimum lumen area: 3.1 mm. Stent strut is well apposed. Post-stent angioplasty was performed using a BALLOON [place] Drain 2.70J50. Maximum pressure: 14 atm. The pre-interventional distal flow is normal (TIMI 3). The post-interventional distal flow is normal (TIMI 3). The intervention was successful . No complications occurred at this lesion.  There is no residual stenosis post intervention.  Dist LAD lesion, 30% stenosed. The lesion is irregular.  First Diagonal Branch  Vessel is moderate in size.  Second Diagonal Branch  Vessel is small in size.  Third Diagonal Branch  Vessel is small in size.  Ramus Intermedius  Vessel is small.  Left Circumflex  Vessel is moderate in size.  Prox Cx to Dist Cx lesion, 30% stenosed. The lesion is irregular.  First Obtuse Marginal Branch  Vessel is moderate in size.  Ost 1st Mrg to 1st Mrg lesion, 60% stenosed.  Second Obtuse Marginal Branch  Vessel is small in size.  Third Obtuse Marginal Branch  Vessel is small in size.  Right Coronary Artery  Vessel is large. There is mild diffuse disease throughout the vessel.  Ost RCA to Mid RCA lesion, 20% stenosed. The lesion was previously treated using a bare metal stent over 2 years ago.  Mid RCA lesion, 10% stenosed. The lesion was previously treated using a bare metal stent over 2 years ago.  Dist RCA lesion, 40% stenosed. The lesion is discrete.  Right Posterior Descending Artery  The vessel exhibits minimal luminal irregularities.  Right Posterior Atrioventricular Branch  There is mild diffuse disease in the vessel.  Wall Motion   Resting               Left Heart   Left Ventricle The left ventricular size is normal. The left ventricular systolic function is normal. LV end  diastolic pressure is normal. The left ventricular ejection fraction is greater than 65% by visual estimate. No regional wall motion abnormalities.    Coronary Diagrams   Diagnostic Diagram     Post-Intervention Diagram     Implants     Permanent Stent  Stent Xience Alpine Rx 3.0x18 - [id number] - Implanted    Inventory item: Stent Xience Alpine Rx 3.0x18 Model/Cat number: [id number]  Manufacturer: [employer] Lot number: [id number]  Device identifier: [id number] Device identifier type: GS1  GUDID Information   Request status Successful    Brand name: [place]bot Version/Model: [id number]  Company name: [employer] MRI safety info as of 11/05/15: MR Conditional  Contains dry or latex rubber: No    GMDN P.T. name: Drug-eluting coronary artery stent, non-bioabsorbable-polymer-coated    As of 11/05/2015   Status: Implanted       Hospital Course: 60 y.o. 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  Eval for CP 2016 w/ neg DBA echo, EF normal. Saw Dr Beatrix Fetters last 08/2014.   Pt woke 11/05/15 with chest pain, diaphoresis, nausea and SOB. It was a squeezing pain, substernal, 9/10. It radiated to both arms with numbness. NTG x 2 at home helped a little, 7/10. When that did not work, he went to [place]. He was given ASA 324 mg, heparin, Duoneb, steroids, Zofran, nitro gtt, and morphine totaling 12 mg. The pain in ER was down to a 5/10. Initial ez negative and ECG not acute.    ECG: 09/14 SR, inferior Q waves, minor changes from 2016  He went to the cath lab with above procedure-PCI to mLAD with DES. Normal EF.  He continued with mild pain not felt to be cardiac.  Troponins were neg. He ambulated with Cardiac rehab and seen and evaluated by Dr. Gwenlyn Found . Labs OK. Can DC home on DAPT.  He had been treated for COPD  exacerbation on 10/30/15 by PCP, by admit ABX and steroids stopped.   He was given recommendation to stop smoking and is down to 6 cigarettes a day.  Cardiac rehab was recommended.   Needs labs for hepatic and lipids.  Will get as outpt.  Now on statin higher dose statin. Continue ACE, no BB but BP soft prior to discharge, may add as outpt.     Consults: None  Significant Diagnostic Studies:  BMP Latest Ref Rng & Units 11/06/2015 11/05/2015 11/05/2015  Glucose 65 - 99 mg/dL 112(H) - 100(H)  BUN 6 - 20 mg/dL 13 - 13  Creatinine 0.61 - 1.24 mg/dL 0.81 0.94 0.73  Sodium 135 - 145 mmol/L 135 - 135  Potassium 3.5 - 5.1 mmol/L 4.2 - 4.0  Chloride 101 - 111 mmol/L 105 - 101  CO2 22 - 32 mmol/L 21(L) - 25  Calcium 8.9 - 10.3 mg/dL 8.9 - 9.3   CBC Latest Ref Rng & Units 11/06/2015 11/05/2015 11/05/2015  WBC 4.0 - 10.5 K/uL 13.4(H) 17.2(H)  12.6(H)  Hemoglobin 13.0 - 17.0 g/dL 12.7(L) 13.7 15.2  Hematocrit 39.0 - 52.0 % 38.9(L) 41.1 43.2  Platelets 150 - 400 K/uL 187 215 242    Troponins Negative  CHEST  2 VIEW COMPARISON:  10/28/2015 FINDINGS: Cardiomediastinal contours are unchanged. No focal airspace consolidation or pulmonary edema. No pneumothorax or pleural effusion.  IMPRESSION: No active cardiopulmonary disease.  Discharge Exam: Blood pressure 115/64, pulse 79, temperature 98.1 F (36.7 C), temperature source Oral, resp. rate 18, height 6' (1.829 m), weight 258 lb 13.1 oz (117.4 kg), SpO2 99 %.  Disposition: Home  Discharge Instructions    Amb Referral to Cardiac Rehabilitation    Complete by:  As directed    Referring to [facility] Phase 2   Diagnosis:  Coronary Stents       Medication List    STOP taking these medications   aspirin 325 MG tablet Replaced by:  aspirin 81 MG chewable tablet   doxycycline 100 MG capsule Commonly known as:  VIBRAMYCIN   omeprazole 20 MG capsule Commonly known as:  PRILOSEC Replaced by:  pantoprazole 40 MG tablet   predniSONE 20  MG tablet Commonly known as:  DELTASONE   simvastatin 20 MG tablet Commonly known as:  ZOCOR     TAKE these medications   acetaminophen 500 MG chewable tablet Commonly known as:  TYLENOL Chew 1,000 mg by mouth every 6 (six) hours as needed. For pain   aspirin 81 MG chewable tablet Chew 1 tablet (81 mg total) by mouth daily. Start taking on:  11/07/2015 Replaces:  aspirin 325 MG tablet   atorvastatin 80 MG tablet Commonly known as:  LIPITOR Take 1 tablet (80 mg total) by mouth daily at 6 PM.   clopidogrel 75 MG tablet Commonly known as:  PLAVIX Take 1 tablet (75 mg total) by mouth daily with breakfast. Start taking on:  11/07/2015   cyclobenzaprine 5 MG tablet Commonly known as:  FLEXERIL Take 1 tablet (5 mg total) by mouth every 6 (six) hours as needed (for back pain).   famotidine 20 MG tablet Commonly known as:  PEPCID Take 1 tablet (20 mg total) by mouth 2 (two) times daily.   fluticasone-salmeterol 45-21 MCG/ACT inhaler Commonly known as:  ADVAIR HFA Inhale 1 puff into the lungs 2 (two) times daily.   hydrOXYzine 25 MG capsule Commonly known as:  VISTARIL Take 50 mg by mouth at bedtime as needed.   levothyroxine 25 MCG tablet Commonly known as:  SYNTHROID, LEVOTHROID Take 25 mcg by mouth daily.   lisinopril 10 MG tablet Commonly known as:  PRINIVIL,ZESTRIL Take 1 tablet (10 mg total) by mouth daily. Start taking on:  11/07/2015   MULTI-VITAMINS Tabs Take 1 tablet by mouth daily.   nitroGLYCERIN 0.4 MG SL tablet Commonly known as:  NITROSTAT Place 0.4 mg under the tongue every 5 (five) minutes as needed for chest pain.   ondansetron 4 MG disintegrating tablet Commonly known as:  ZOFRAN ODT '4mg'$  ODT q4 hours prn nausea/vomit   Oxycodone HCl 10 MG Tabs Take 10 mg by mouth every 4 (four) hours as needed (for pain).   pantoprazole 40 MG tablet Commonly known as:  PROTONIX Take 1 tablet (40 mg total) by mouth daily. Start taking on:  11/07/2015 Replaces:   omeprazole 20 MG capsule   polyethylene glycol packet Commonly known as:  MIRALAX / GLYCOLAX Take 17 g by mouth daily as needed for mild constipation.   promethazine 25 MG tablet Commonly known as:  PHENERGAN Take 1 tablet (25 mg total) by mouth every 6 (six) hours as needed for nausea or vomiting. What changed:  Another medication with the same name was removed. Continue taking this medication, and follow the directions you see here.   QUEtiapine 100 MG tablet Commonly known as:  SEROQUEL Take 100 mg by mouth 2 (two) times daily.   STOOL SOFTENER 100 MG capsule Generic drug:  docusate sodium Take 100 mg by mouth 2 (two) times daily.   temazepam 30 MG capsule Commonly known as:  RESTORIL Take 30 mg by mouth daily.   traZODone 100 MG tablet Commonly known as:  DESYREL Take 200 mg by mouth at bedtime.   venlafaxine 100 MG tablet Commonly known as:  EFFEXOR Take 300 mg by mouth daily.      Follow-up Information    Kirk Ruths, MD Follow up on 11/18/2015.   Specialty:  Cardiology Why:  at12:30 pm  Contact information: [address] [place] [phone]            Discharge Instructions: Call [place] at [phone] if any bleeding, swelling or drainage at cath site.  May shower, no tub baths for 48 hours for groin sticks. No lifting over 5 pounds for 3 days.  No Driving for 3 days.    Heart Healthy Diet  DO NOT stop plavix and asprin, thes meds keep the stent open -  If you stop you could have a heart attack.  Follow up with Dr. Stanford Breed at the [facility] office.    Signed: Cecilie Kicks Nurse Practitioner-Certified [facility]: [facility] 11/06/2015, 1:18 PM  Time spent on discharge : > 30 minutes.

## 2015-11-06 NOTE — Progress Notes (Signed)
Subjective:  No CP/ SOB s/p FFR guided mid LAD PCI/DES with Xience stent. Nl LV fxn  Objective:  Temp:  [97.4 F (36.3 C)-99.8 F (37.7 C)] 97.4 F (36.3 C) (09/15 0745) Pulse Rate:  [0-102] 82 (09/15 0745) Resp:  [0-46] 16 (09/15 0745) BP: (119-175)/(72-116) 139/78 (09/15 0745) SpO2:  [0 %-100 %] 99 % (09/15 0812) Weight:  [252 lb 8 oz (114.5 kg)-258 lb 13.1 oz (117.4 kg)] 258 lb 13.1 oz (117.4 kg) (09/15 0319) Weight change: 2 lb 8 oz (1.134 kg)  Intake/Output from previous day: 09/14 0701 - 09/15 0700 In: 1261.3 [P.O.:480; I.V.:781.3] Out: 1800 [Urine:1800]  Intake/Output from this shift: Total I/O In: 360 [P.O.:360] Out: 600 [Urine:600]  Physical Exam: General appearance: alert and no distress Neck: no adenopathy, no carotid bruit, no JVD, supple, symmetrical, trachea midline and thyroid not enlarged, symmetric, no tenderness/mass/nodules Lungs: clear to auscultation bilaterally Heart: regular rate and rhythm, S1, S2 normal, no murmur, click, rub or gallop Extremities: Right radial puncture site OK  Lab Results: Results for orders placed or performed during the hospital encounter of 11/05/15 (from the past 48 hour(s))  Basic metabolic panel     Status: Abnormal   Collection Time: 11/05/15  5:30 AM  Result Value Ref Range   Sodium 135 135 - 145 mmol/L   Potassium 4.0 3.5 - 5.1 mmol/L   Chloride 101 101 - 111 mmol/L   CO2 25 22 - 32 mmol/L   Glucose, Bld 100 (H) 65 - 99 mg/dL   BUN 13 6 - 20 mg/dL   Creatinine, Ser 0.73 0.61 - 1.24 mg/dL   Calcium 9.3 8.9 - 10.3 mg/dL   GFR calc non Af Amer >60 >60 mL/min   GFR calc Af Amer >60 >60 mL/min    Comment: (NOTE) The eGFR has been calculated using the CKD EPI equation. This calculation has not been validated in all clinical situations. eGFR's persistently <60 mL/min signify possible Chronic Kidney Disease.    Anion gap 9 5 - 15  CBC     Status: Abnormal   Collection Time: 11/05/15  5:30 AM  Result Value  Ref Range   WBC 12.6 (H) 4.0 - 10.5 K/uL   RBC 5.14 4.22 - 5.81 MIL/uL   Hemoglobin 15.2 13.0 - 17.0 g/dL   HCT 43.2 39.0 - 52.0 %   MCV 84.0 78.0 - 100.0 fL   MCH 29.6 26.0 - 34.0 pg   MCHC 35.2 30.0 - 36.0 g/dL   RDW 15.1 11.5 - 15.5 %   Platelets 242 150 - 400 K/uL  Troponin I     Status: None   Collection Time: 11/05/15  5:30 AM  Result Value Ref Range   Troponin I <0.03 <0.03 ng/mL  Brain natriuretic peptide     Status: None   Collection Time: 11/05/15  5:30 AM  Result Value Ref Range   B Natriuretic Peptide 6.0 0.0 - 100.0 pg/mL  Troponin I     Status: None   Collection Time: 11/05/15  8:30 AM  Result Value Ref Range   Troponin I <0.03 <0.03 ng/mL  MRSA PCR Screening     Status: None   Collection Time: 11/05/15  1:03 PM  Result Value Ref Range   MRSA by PCR NEGATIVE NEGATIVE    Comment:        The GeneXpert MRSA Assay (FDA approved for NASAL specimens only), is one component of a comprehensive MRSA colonization surveillance program. It is not intended  to diagnose MRSA infection nor to guide or monitor treatment for MRSA infections.   POCT Activated clotting time     Status: None   Collection Time: 11/05/15  3:36 PM  Result Value Ref Range   Activated Clotting Time 241 seconds  POCT Activated clotting time     Status: None   Collection Time: 11/05/15  3:48 PM  Result Value Ref Range   Activated Clotting Time 367 seconds  POCT Activated clotting time     Status: None   Collection Time: 11/05/15  3:57 PM  Result Value Ref Range   Activated Clotting Time 252 seconds  CBC     Status: Abnormal   Collection Time: 11/05/15  5:13 PM  Result Value Ref Range   WBC 17.2 (H) 4.0 - 10.5 K/uL   RBC 4.81 4.22 - 5.81 MIL/uL   Hemoglobin 13.7 13.0 - 17.0 g/dL   HCT 41.1 39.0 - 52.0 %   MCV 85.4 78.0 - 100.0 fL   MCH 28.5 26.0 - 34.0 pg   MCHC 33.3 30.0 - 36.0 g/dL   RDW 15.0 11.5 - 15.5 %   Platelets 215 150 - 400 K/uL  Creatinine, serum     Status: None   Collection  Time: 11/05/15  5:13 PM  Result Value Ref Range   Creatinine, Ser 0.94 0.61 - 1.24 mg/dL   GFR calc non Af Amer >60 >60 mL/min   GFR calc Af Amer >60 >60 mL/min    Comment: (NOTE) The eGFR has been calculated using the CKD EPI equation. This calculation has not been validated in all clinical situations. eGFR's persistently <60 mL/min signify possible Chronic Kidney Disease.   CBC     Status: Abnormal   Collection Time: 11/06/15  4:20 AM  Result Value Ref Range   WBC 13.4 (H) 4.0 - 10.5 K/uL   RBC 4.50 4.22 - 5.81 MIL/uL   Hemoglobin 12.7 (L) 13.0 - 17.0 g/dL   HCT 38.9 (L) 39.0 - 52.0 %   MCV 86.4 78.0 - 100.0 fL   MCH 28.2 26.0 - 34.0 pg   MCHC 32.6 30.0 - 36.0 g/dL   RDW 14.9 11.5 - 15.5 %   Platelets 187 150 - 400 K/uL  Basic metabolic panel     Status: Abnormal   Collection Time: 11/06/15  4:20 AM  Result Value Ref Range   Sodium 135 135 - 145 mmol/L   Potassium 4.2 3.5 - 5.1 mmol/L   Chloride 105 101 - 111 mmol/L   CO2 21 (L) 22 - 32 mmol/L   Glucose, Bld 112 (H) 65 - 99 mg/dL   BUN 13 6 - 20 mg/dL   Creatinine, Ser 0.81 0.61 - 1.24 mg/dL   Calcium 8.9 8.9 - 10.3 mg/dL   GFR calc non Af Amer >60 >60 mL/min   GFR calc Af Amer >60 >60 mL/min    Comment: (NOTE) The eGFR has been calculated using the CKD EPI equation. This calculation has not been validated in all clinical situations. eGFR's persistently <60 mL/min signify possible Chronic Kidney Disease.    Anion gap 9 5 - 15    Imaging: Imaging results have been reviewed  Tele- NSR  Assessment/Plan:   1. Active Problems: 2.   Chest pain 3.   Ischemic chest pain ([place]) 4.   Time Spent Directly with Patient:  20 minutes  Length of Stay:  LOS: 1 day   S/P radial LHC by Dr End. RCA stents widely patent. Intermediate mid  LAD lesion signif by FFR and stented with a Xience DES. Nl LV fxn. No other signif CAD. Exam benign. Labs OK. Can DC home on DAPT. Needs OV with MLP 2-3 weeks then F/U with one of [place] 6-8  weeks   Quay Burow 11/06/2015, 9:23 AM

## 2015-11-09 ENCOUNTER — Emergency Department (HOSPITAL_COMMUNITY)
Admission: EM | Admit: 2015-11-09 | Discharge: 2015-11-09 | Disposition: A | Discharge status: Home / Self Care | Payer: Medicare HMO | Attending: Emergency Medicine | Admitting: Emergency Medicine

## 2015-11-09 ENCOUNTER — Emergency Department (HOSPITAL_COMMUNITY): Payer: Medicare HMO

## 2015-11-09 ENCOUNTER — Encounter (HOSPITAL_COMMUNITY): Payer: Self-pay

## 2015-11-09 DIAGNOSIS — Z7982 Long term (current) use of aspirin: Secondary | ICD-10-CM | POA: Insufficient documentation

## 2015-11-09 DIAGNOSIS — E039 Hypothyroidism, unspecified: Secondary | ICD-10-CM | POA: Diagnosis not present

## 2015-11-09 DIAGNOSIS — I252 Old myocardial infarction: Secondary | ICD-10-CM | POA: Diagnosis not present

## 2015-11-09 DIAGNOSIS — Z955 Presence of coronary angioplasty implant and graft: Secondary | ICD-10-CM | POA: Diagnosis not present

## 2015-11-09 DIAGNOSIS — Z79899 Other long term (current) drug therapy: Secondary | ICD-10-CM | POA: Insufficient documentation

## 2015-11-09 DIAGNOSIS — R079 Chest pain, unspecified: Secondary | ICD-10-CM

## 2015-11-09 DIAGNOSIS — I10 Essential (primary) hypertension: Secondary | ICD-10-CM | POA: Diagnosis not present

## 2015-11-09 DIAGNOSIS — R0602 Shortness of breath: Secondary | ICD-10-CM | POA: Insufficient documentation

## 2015-11-09 DIAGNOSIS — R0789 Other chest pain: Secondary | ICD-10-CM | POA: Diagnosis not present

## 2015-11-09 DIAGNOSIS — J449 Chronic obstructive pulmonary disease, unspecified: Secondary | ICD-10-CM | POA: Diagnosis not present

## 2015-11-09 DIAGNOSIS — I2510 Atherosclerotic heart disease of native coronary artery without angina pectoris: Secondary | ICD-10-CM | POA: Insufficient documentation

## 2015-11-09 DIAGNOSIS — F17200 Nicotine dependence, unspecified, uncomplicated: Secondary | ICD-10-CM | POA: Insufficient documentation

## 2015-11-09 LAB — CBC
HCT: 42.9 % (ref 39.0–52.0)
HEMOGLOBIN: 14.3 g/dL (ref 13.0–17.0)
MCH: 28.5 pg (ref 26.0–34.0)
MCHC: 33.3 g/dL (ref 30.0–36.0)
MCV: 85.5 fL (ref 78.0–100.0)
Platelets: 197 10*3/uL (ref 150–400)
RBC: 5.02 MIL/uL (ref 4.22–5.81)
RDW: 14.6 % (ref 11.5–15.5)
WBC: 10.9 10*3/uL — ABNORMAL HIGH (ref 4.0–10.5)

## 2015-11-09 LAB — BASIC METABOLIC PANEL
ANION GAP: 12 (ref 5–15)
BUN: 8 mg/dL (ref 6–20)
CALCIUM: 9.3 mg/dL (ref 8.9–10.3)
CO2: 22 mmol/L (ref 22–32)
CREATININE: 0.73 mg/dL (ref 0.61–1.24)
Chloride: 100 mmol/L — ABNORMAL LOW (ref 101–111)
Glucose, Bld: 117 mg/dL — ABNORMAL HIGH (ref 65–99)
Potassium: 3.8 mmol/L (ref 3.5–5.1)
SODIUM: 134 mmol/L — AB (ref 135–145)

## 2015-11-09 LAB — I-STAT TROPONIN, ED
TROPONIN I, POC: 0 ng/mL (ref 0.00–0.08)
Troponin i, poc: 0 ng/mL (ref 0.00–0.08)

## 2015-11-09 LAB — BRAIN NATRIURETIC PEPTIDE: B Natriuretic Peptide: 7.3 pg/mL (ref 0.0–100.0)

## 2015-11-09 MED ORDER — ONDANSETRON HCL 4 MG/2ML IJ SOLN
4.0000 mg | Freq: Once | INTRAMUSCULAR | Status: AC
  Administered 2015-11-09: 4 mg via INTRAVENOUS
  Filled 2015-11-09: qty 2

## 2015-11-09 MED ORDER — MORPHINE SULFATE (PF) 4 MG/ML IV SOLN
INTRAVENOUS | Status: AC
  Administered 2015-11-09: 07:00:00
  Filled 2015-11-09: qty 1

## 2015-11-09 MED ORDER — MORPHINE SULFATE (PF) 4 MG/ML IV SOLN
4.0000 mg | Freq: Once | INTRAVENOUS | Status: AC
  Administered 2015-11-09: 4 mg via INTRAVENOUS
  Filled 2015-11-09: qty 1

## 2015-11-09 MED ORDER — MORPHINE SULFATE (PF) 4 MG/ML IV SOLN
4.0000 mg | Freq: Once | INTRAVENOUS | Status: DC
  Filled 2015-11-09: qty 1

## 2015-11-09 NOTE — ED Triage Notes (Signed)
Pt comes from home via [facility], c/o CP, seen recently and had stent placement in prox. LAD on 9/14. CP started around 9pm, substernal, non radiating, c/o nausea no vomiting. PTA received 2 nitro, 324 ASA.

## 2015-11-09 NOTE — Discharge Instructions (Signed)
Continue your current medications. Follow up with cardiology as scheduled. Return if any worsening symptoms.

## 2015-11-09 NOTE — ED Provider Notes (Signed)
[facility] DEPT Provider Note   CSN: [id number] Arrival date & time: 11/09/15  0457     History   Chief Complaint Chief Complaint  Patient presents with  . Chest Pain    HPI [name] is a 60 y.o. male.  HPI [name] is a 60 y.o. male with hx of asthma, COPD, DVTs, CAD, presents to ED with complaint of chest pain and SOB. Patient states his pain started approximately 10 hours ago. He describes pain as throbbing. It is located in the left chest. It does not radiate. Patient with history of coronary disease, had cardiac cath and stenting on 11/05/15. Pt states he has associated SOB, dizziness, nausea. States took aspirin and nitro with small relief of the pain. No swelling in extremities. No cough. No fever, chills.   Past Medical History:  Diagnosis Date  . Anginal pain ([place])   . Anxiety   . Arthritis    "right hip; herniated L4-5" (11/05/2015)  . Asthma   . Bipolar 1 disorder ([place])   . Chronic bronchitis ([place])   . Chronic lower back pain   . COPD (chronic obstructive pulmonary disease) ([place])   . Coronary artery disease   . DVT (deep venous thrombosis) ([place])    "left calf; after 1st knee scope"  . GERD (gastroesophageal reflux disease)   . Heart attack ([place]) 2010   BMS x 2 RCA  . High cholesterol   . Hypertension   . Hypothyroidism   . Pneumonia    "several times"  . S/P angioplasty with stent 11/05/15 PCI DES to mLAD  11/06/2015    Patient Active Problem List   Diagnosis Date Noted  . S/P angioplasty with stent 11/05/15 PCI DES to mLAD  11/06/2015  . Ischemic chest pain ([place])   . Status post cholecystectomy 09/25/2014  . Chest pain 09/24/2014  . COPD exacerbation ([place]) 09/24/2014  . Chest pain of uncertain etiology   . Hyperlipidemia LDL goal <70 12/05/2008  . Obesity 12/05/2008  . DEPRESSION, MAJOR 12/05/2008  . BIPOLAR DISORDER UNSPECIFIED 12/05/2008  . TOBACCO ABUSE 12/05/2008  . HYPERTENSION, UNSPECIFIED 12/05/2008  . MYOCARDIAL INFARCTION, HX  OF 12/05/2008  . Coronary atherosclerosis 12/05/2008  . COPD 12/05/2008  . PULMONARY NODULE, LEFT LOWER LOBE 12/05/2008  . GERD 12/05/2008  . EROSIVE GASTRITIS 12/05/2008  . DEEP VENOUS THROMBOPHLEBITIS, HX OF 12/05/2008    Past Surgical History:  Procedure Laterality Date  . APPENDECTOMY    . CARDIAC CATHETERIZATION N/A 11/05/2015   Procedure: Left Heart Cath and Coronary Angiography;  Surgeon: Nelva Bush, MD;  Location: [facility];  Service: Cardiovascular;  Laterality: N/A;  . CARDIAC CATHETERIZATION N/A 11/05/2015   Procedure: Coronary Stent Intervention;  Surgeon: Nelva Bush, MD;  Location: [facility];  Service: Cardiovascular;  Laterality: N/A;  . CARDIAC CATHETERIZATION N/A 11/05/2015   Procedure: Intravascular Pressure Wire/FFR Study;  Surgeon: Nelva Bush, MD;  Location: [facility];  Service: Cardiovascular;  Laterality: N/A;  . CORONARY ANGIOPLASTY WITH STENT PLACEMENT  2010   90% RCA s/p BMS x 2, 50% LAD, 30% CFX, EF 45-50%  . CORONARY ANGIOPLASTY WITH STENT PLACEMENT  11/05/2015   "1 today; makes a total of 4 stents" (11/05/2015)  . INGUINAL HERNIA REPAIR Right   . KNEE ARTHROSCOPY Left X 3  . LAPAROSCOPIC CHOLECYSTECTOMY    . TONSILLECTOMY AND ADENOIDECTOMY         Home Medications    Prior to Admission medications   Medication Sig  Start Date End Date Taking? Authorizing Provider  acetaminophen (TYLENOL) 500 MG chewable tablet Chew 1,000 mg by mouth every 6 (six) hours as needed. For pain    Historical Provider, MD  aspirin 81 MG chewable tablet Chew 1 tablet (81 mg total) by mouth daily. 11/07/15   Isaiah Serge, NP  atorvastatin (LIPITOR) 80 MG tablet Take 1 tablet (80 mg total) by mouth daily at 6 PM. 11/06/15   Isaiah Serge, NP  clopidogrel (PLAVIX) 75 MG tablet Take 1 tablet (75 mg total) by mouth daily with breakfast. 11/07/15   Isaiah Serge, NP  cyclobenzaprine (FLEXERIL) 5 MG tablet Take 1 tablet (5 mg total) by mouth  every 6 (six) hours as needed (for back pain). 09/25/14   Reyne Dumas, MD  docusate sodium (STOOL SOFTENER) 100 MG capsule Take 100 mg by mouth 2 (two) times daily.    Historical Provider, MD  famotidine (PEPCID) 20 MG tablet Take 1 tablet (20 mg total) by mouth 2 (two) times daily. 12/17/14   Charlesetta Shanks, MD  fluticasone-salmeterol (ADVAIR HFA) [phone] MCG/ACT inhaler Inhale 1 puff into the lungs 2 (two) times daily.     Historical Provider, MD  hydrOXYzine (VISTARIL) 25 MG capsule Take 50 mg by mouth at bedtime as needed. 09/02/14   Historical Provider, MD  levothyroxine (SYNTHROID, LEVOTHROID) 25 MCG tablet Take 25 mcg by mouth daily.    Historical Provider, MD  lisinopril (PRINIVIL,ZESTRIL) 10 MG tablet Take 1 tablet (10 mg total) by mouth daily. 11/07/15   Isaiah Serge, NP  Multiple Vitamin (MULTI-VITAMINS) TABS Take 1 tablet by mouth daily.    Historical Provider, MD  nitroGLYCERIN (NITROSTAT) 0.4 MG SL tablet Place 0.4 mg under the tongue every 5 (five) minutes as needed for chest pain.     Historical Provider, MD  ondansetron (ZOFRAN ODT) 4 MG disintegrating tablet '4mg'$  ODT q4 hours prn nausea/vomit 12/17/14   Charlesetta Shanks, MD  Oxycodone HCl 10 MG TABS Take 10 mg by mouth every 4 (four) hours as needed (for pain).  09/10/14   Historical Provider, MD  pantoprazole (PROTONIX) 40 MG tablet Take 1 tablet (40 mg total) by mouth daily. 11/07/15   Isaiah Serge, NP  polyethylene glycol [facility] / Floria Raveling) packet Take 17 g by mouth daily as needed for mild constipation.     Historical Provider, MD  promethazine (PHENERGAN) 25 MG tablet Take 1 tablet (25 mg total) by mouth every 6 (six) hours as needed for nausea or vomiting. 10/23/14   Merryl Hacker, MD  QUEtiapine (SEROQUEL) 100 MG tablet Take 100 mg by mouth 2 (two) times daily. 09/12/14   Historical Provider, MD  temazepam (RESTORIL) 30 MG capsule Take 30 mg by mouth daily. 09/23/14   Historical Provider, MD  traZODone (DESYREL) 100 MG tablet Take  200 mg by mouth at bedtime.    Historical Provider, MD  venlafaxine (EFFEXOR) 100 MG tablet Take 300 mg by mouth daily.    Historical Provider, MD    Family History Family History  Problem Relation Age of Onset  . Heart Problems Mother     97  . Heart disease Father     43    Social History Social History  Substance Use Topics  . Smoking status: Current Every Day Smoker    Packs/day: 0.33    Years: 42.00  . Smokeless tobacco: Never Used     Comment: 11/05/2015 "down from 2ppd to 6 cigarettes/day"  . Alcohol use No  Allergies   Rocephin [ceftriaxone sodium in dextrose]; Prochlorperazine edisylate; Toradol [ketorolac tromethamine]; and Tramadol   Review of Systems Review of Systems  Constitutional: Negative for chills and fever.  Respiratory: Positive for chest tightness and shortness of breath. Negative for cough.   Cardiovascular: Positive for chest pain. Negative for palpitations and leg swelling.  Gastrointestinal: Positive for nausea. Negative for abdominal distention, abdominal pain, diarrhea and vomiting.  Genitourinary: Negative for dysuria, frequency, hematuria and urgency.  Musculoskeletal: Negative for arthralgias, myalgias, neck pain and neck stiffness.  Skin: Negative for rash.  Allergic/Immunologic: Negative for immunocompromised state.  Neurological: Positive for dizziness and light-headedness. Negative for weakness, numbness and headaches.  All other systems reviewed and are negative.    Physical Exam Updated Vital Signs BP (!) 161/133   Pulse 94   Temp 98.6 F (37 C)   Resp 18   Ht 6' (1.829 m)   Wt 113.4 kg   SpO2 100%   BMI 33.91 kg/m   Physical Exam  Constitutional: He appears well-developed and well-nourished. No distress.  HENT:  Head: Normocephalic and atraumatic.  Eyes: Conjunctivae are normal.  Neck: Neck supple.  Cardiovascular: Normal rate, regular rhythm and normal heart sounds.   Pulmonary/Chest: Effort normal. No  respiratory distress. He has no wheezes. He has no rales.  Abdominal: Soft. Bowel sounds are normal. He exhibits no distension. There is no tenderness. There is no rebound.  Musculoskeletal: He exhibits no edema.  Neurological: He is alert.  Skin: Skin is warm and dry.  Nursing note and vitals reviewed.    ED Treatments / Results  Labs (all labs ordered are listed, but only abnormal results are displayed) Labs Reviewed  BASIC METABOLIC PANEL - Abnormal; Notable for the following:       Result Value   Sodium 134 (*)    Chloride 100 (*)    Glucose, Bld 117 (*)    All other components within normal limits  CBC - Abnormal; Notable for the following:    WBC 10.9 (*)    All other components within normal limits  BRAIN NATRIURETIC PEPTIDE  I-STAT TROPOININ, ED    EKG  EKG Interpretation  Date/Time:  Monday November 09 2015 06:00:52 EDT Ventricular Rate:  89 PR Interval:    QRS Duration: 99 QT Interval:  370 QTC Calculation: 451 R Axis:   69 Text Interpretation:  Sinus rhythm No significant change since last tracing Confirmed by Ashok Cordia  MD, Lennette Bihari ([id number]) on 11/09/2015 7:54:24 AM       Radiology Dg Chest 2 View  Result Date: 11/09/2015 CLINICAL DATA:  60 y/o  M; chest pain with shortness of breath. EXAM: CHEST  2 VIEW COMPARISON:  11/05/2015 chest radiograph. FINDINGS: Stable cardiomediastinal silhouette given differences in technique. Clear lungs. No pneumothorax. No pleural effusion. No acute osseous abnormality is evident. Mild multilevel degenerative changes of the spine. IMPRESSION: Clear lungs. Electronically Signed   By: Kristine Garbe M.D.   On: 11/09/2015 05:41    Procedures Procedures (including critical care time)  Medications Ordered in ED Medications  morphine 4 MG/ML injection 4 mg (not administered)  ondansetron (ZOFRAN) injection 4 mg (not administered)     Initial Impression / Assessment and Plan / ED Course  I have reviewed the triage vital  signs and the nursing notes.  Pertinent labs & imaging results that were available during my care of the patient were reviewed by me and considered in my medical decision making (see chart for details).  Clinical  Course   7:13 AM Discussed with Dr. Stanford Breed with cardiology. Asked to consult. Dr. Stanford Breed looked over pt's results and records. Does not believe pt needs any further treatment at this time. I will recheck 2nd trop. Pt has a follow up apt in few days.   8:36 AM Delta trop negative. Will dc home with return precautions.   Final Clinical Impressions(s) / ED Diagnoses   Final diagnoses:  Chest pain, unspecified chest pain type    New Prescriptions New Prescriptions   No medications on file     Jeannett Senior, PA-C 11/09/15 [place], DO 11/10/15 [id number]

## 2015-11-16 NOTE — Progress Notes (Deleted)
HPI: Follow-up coronary artery disease. Patient had a bare metal stent 2 to the right coronary artery in 2010 in the setting of a myocardial infarction. Echocardiogram 2010 showed ejection fraction 45-50%, inferior posterior hypokinesis, grade 1 diastolic dysfunction. Admitted with unstable angina September 2017 and had cardiac catheterization. Patient had patent stents in the RCA. There is a 60% mid LAD with positive FFR. LV function normal. Patient had PCI of the mid LAD with a drug-eluting stent. Note reviewing records the patient had a CTA in August 2016 that showed a left lower lobe nodule with follow-up recommended. Patient's emergency room September 18 chest pain enzymes negative.    Current Outpatient Prescriptions  Medication Sig Dispense Refill  . acetaminophen (TYLENOL) 500 MG chewable tablet Chew 1,000 mg by mouth every 6 (six) hours as needed. For pain    . aspirin 81 MG chewable tablet Chew 1 tablet (81 mg total) by mouth daily.    Marland Kitchen atorvastatin (LIPITOR) 80 MG tablet Take 1 tablet (80 mg total) by mouth daily at 6 PM. 30 tablet 6  . clopidogrel (PLAVIX) 75 MG tablet Take 1 tablet (75 mg total) by mouth daily with breakfast. 30 tablet 11  . cyclobenzaprine (FLEXERIL) 5 MG tablet Take 1 tablet (5 mg total) by mouth every 6 (six) hours as needed (for back pain). 45 tablet 0  . docusate sodium (STOOL SOFTENER) 100 MG capsule Take 100 mg by mouth 2 (two) times daily.    . famotidine (PEPCID) 20 MG tablet Take 1 tablet (20 mg total) by mouth 2 (two) times daily. 30 tablet 0  . fluticasone-salmeterol (ADVAIR HFA) 45-21 MCG/ACT inhaler Inhale 1 puff into the lungs 2 (two) times daily.     Marland Kitchen levothyroxine (SYNTHROID, LEVOTHROID) 25 MCG tablet Take 25 mcg by mouth daily.    Marland Kitchen lisinopril (PRINIVIL,ZESTRIL) 10 MG tablet Take 1 tablet (10 mg total) by mouth daily. 30 tablet 11  . Multiple Vitamin (MULTI-VITAMINS) TABS Take 1 tablet by mouth daily.    . nitroGLYCERIN (NITROSTAT) 0.4 MG SL  tablet Place 0.4 mg under the tongue every 5 (five) minutes as needed for chest pain.     . Oxycodone HCl 10 MG TABS Take 10 mg by mouth every 4 (four) hours.   0  . pantoprazole (PROTONIX) 40 MG tablet Take 1 tablet (40 mg total) by mouth daily. 30 tablet 11  . polyethylene glycol (MIRALAX / GLYCOLAX) packet Take 17 g by mouth daily as needed for mild constipation.     . promethazine (PHENERGAN) 25 MG tablet Take 1 tablet (25 mg total) by mouth every 6 (six) hours as needed for nausea or vomiting. 30 tablet 0  . QUEtiapine (SEROQUEL) 100 MG tablet Take 100 mg by mouth 2 (two) times daily.  2  . temazepam (RESTORIL) 30 MG capsule Take 30 mg by mouth daily.    . traZODone (DESYREL) 100 MG tablet Take 200 mg by mouth at bedtime.    Marland Kitchen venlafaxine (EFFEXOR) 100 MG tablet Take 300 mg by mouth daily.     No current facility-administered medications for this visit.      Past Medical History:  Diagnosis Date  . Anginal pain ([place])   . Anxiety   . Arthritis    "right hip; herniated L4-5" (11/05/2015)  . Asthma   . Bipolar 1 disorder ([place])   . Chronic bronchitis ([place])   . Chronic lower back pain   . COPD (chronic obstructive pulmonary disease) ([place])   .  Coronary artery disease   . DVT (deep venous thrombosis) ([place])    "left calf; after 1st knee scope"  . GERD (gastroesophageal reflux disease)   . Heart attack ([place]) 2010   BMS x 2 RCA  . High cholesterol   . Hypertension   . Hypothyroidism   . Pneumonia    "several times"  . S/P angioplasty with stent 11/05/15 PCI DES to mLAD  11/06/2015    Past Surgical History:  Procedure Laterality Date  . APPENDECTOMY    . CARDIAC CATHETERIZATION N/A 11/05/2015   Procedure: Left Heart Cath and Coronary Angiography;  Surgeon: Nelva Bush, MD;  Location: [facility];  Service: Cardiovascular;  Laterality: N/A;  . CARDIAC CATHETERIZATION N/A 11/05/2015   Procedure: Coronary Stent Intervention;  Surgeon: Nelva Bush, MD;  Location: [facility];  Service: Cardiovascular;  Laterality: N/A;  . CARDIAC CATHETERIZATION N/A 11/05/2015   Procedure: Intravascular Pressure Wire/FFR Study;  Surgeon: Nelva Bush, MD;  Location: [facility];  Service: Cardiovascular;  Laterality: N/A;  . CORONARY ANGIOPLASTY WITH STENT PLACEMENT  2010   90% RCA s/p BMS x 2, 50% LAD, 30% CFX, EF 45-50%  . CORONARY ANGIOPLASTY WITH STENT PLACEMENT  11/05/2015   "1 today; makes a total of 4 stents" (11/05/2015)  . INGUINAL HERNIA REPAIR Right   . KNEE ARTHROSCOPY Left X 3  . LAPAROSCOPIC CHOLECYSTECTOMY    . TONSILLECTOMY AND ADENOIDECTOMY      Social History   Social History  . Marital status: Divorced    Spouse name: N/A  . Number of children: N/A  . Years of education: N/A   Occupational History  . Disabled    Social History Main Topics  . Smoking status: Current Every Day Smoker    Packs/day: 0.33    Years: 42.00  . Smokeless tobacco: Never Used     Comment: 11/05/2015 "down from 2ppd to 6 cigarettes/day"  . Alcohol use No  . Drug use: No  . Sexual activity: Yes   Other Topics Concern  . Not on file   Social History Narrative   Lives alone in [place].    Family History  Problem Relation Age of Onset  . Heart Problems Mother     55  . Heart disease Father     77    ROS: no fevers or chills, productive cough, hemoptysis, dysphasia, odynophagia, melena, hematochezia, dysuria, hematuria, rash, seizure activity, orthopnea, PND, pedal edema, claudication. Remaining systems are negative.  Physical Exam: Well-developed well-nourished in no acute distress.  Skin is warm and dry.  HEENT is normal.  Neck is supple.  Chest is clear to auscultation with normal expansion.  Cardiovascular exam is regular rate and rhythm.  Abdominal exam nontender or distended. No masses palpated. Extremities show no edema. neuro grossly intact  ECG

## 2015-11-17 ENCOUNTER — Encounter: Payer: Self-pay | Admitting: Cardiology

## 2015-11-18 ENCOUNTER — Encounter: Payer: Medicare Other | Admitting: Cardiology

## 2015-11-30 NOTE — Progress Notes (Deleted)
HPI: Follow-up coronary artery disease-patient has a history of bare-metal stent to his right coronary artery in 2010 in the setting of inferior myocardial infarction. Echo 2010 showed ejection fraction 34-28%, grade 1 diastolic dysfunction.  chest CT August 2016 showed left lower lobe nodules and follow-up recommended 6-12 months. Patient recently admitted with chest pain. Patient ruled out for myocardial infarction. Cardiac catheterization in September 2017 showed 50% mid LAD with FFR 0.66, 60% ostial first marginal and 40% distal RCA. LV function was normal and left ventricular end-diastolic pressure 12 mmHg. Patient had PCI of his LAD with a drug-eluting stent. Patient seen in follow-up September 18 for chest pain and enzymes negative. Since last seen,   Current Outpatient Prescriptions  Medication Sig Dispense Refill  . acetaminophen (TYLENOL) 500 MG chewable tablet Chew 1,000 mg by mouth every 6 (six) hours as needed. For pain    . aspirin 81 MG chewable tablet Chew 1 tablet (81 mg total) by mouth daily.    Marland Kitchen atorvastatin (LIPITOR) 80 MG tablet Take 1 tablet (80 mg total) by mouth daily at 6 PM. 30 tablet 6  . clopidogrel (PLAVIX) 75 MG tablet Take 1 tablet (75 mg total) by mouth daily with breakfast. 30 tablet 11  . cyclobenzaprine (FLEXERIL) 5 MG tablet Take 1 tablet (5 mg total) by mouth every 6 (six) hours as needed (for back pain). 45 tablet 0  . docusate sodium (STOOL SOFTENER) 100 MG capsule Take 100 mg by mouth 2 (two) times daily.    . famotidine (PEPCID) 20 MG tablet Take 1 tablet (20 mg total) by mouth 2 (two) times daily. 30 tablet 0  . fluticasone-salmeterol (ADVAIR HFA) 45-21 MCG/ACT inhaler Inhale 1 puff into the lungs 2 (two) times daily.     Marland Kitchen levothyroxine (SYNTHROID, LEVOTHROID) 25 MCG tablet Take 25 mcg by mouth daily.    Marland Kitchen lisinopril (PRINIVIL,ZESTRIL) 10 MG tablet Take 1 tablet (10 mg total) by mouth daily. 30 tablet 11  . Multiple Vitamin (MULTI-VITAMINS) TABS  Take 1 tablet by mouth daily.    . nitroGLYCERIN (NITROSTAT) 0.4 MG SL tablet Place 0.4 mg under the tongue every 5 (five) minutes as needed for chest pain.     . Oxycodone HCl 10 MG TABS Take 10 mg by mouth every 4 (four) hours.   0  . pantoprazole (PROTONIX) 40 MG tablet Take 1 tablet (40 mg total) by mouth daily. 30 tablet 11  . polyethylene glycol (MIRALAX / GLYCOLAX) packet Take 17 g by mouth daily as needed for mild constipation.     . promethazine (PHENERGAN) 25 MG tablet Take 1 tablet (25 mg total) by mouth every 6 (six) hours as needed for nausea or vomiting. 30 tablet 0  . QUEtiapine (SEROQUEL) 100 MG tablet Take 100 mg by mouth 2 (two) times daily.  2  . temazepam (RESTORIL) 30 MG capsule Take 30 mg by mouth daily.    . traZODone (DESYREL) 100 MG tablet Take 200 mg by mouth at bedtime.    Marland Kitchen venlafaxine (EFFEXOR) 100 MG tablet Take 300 mg by mouth daily.     No current facility-administered medications for this visit.      Past Medical History:  Diagnosis Date  . Anginal pain ([place])   . Anxiety   . Arthritis    "right hip; herniated L4-5" (11/05/2015)  . Asthma   . Bipolar 1 disorder ([place])   . Chronic bronchitis ([place])   . Chronic lower back pain   .  COPD (chronic obstructive pulmonary disease) ([place])   . Coronary artery disease   . DVT (deep venous thrombosis) ([place])    "left calf; after 1st knee scope"  . GERD (gastroesophageal reflux disease)   . Heart attack 2010   BMS x 2 RCA  . High cholesterol   . Hypertension   . Hypothyroidism   . Pneumonia    "several times"  . S/P angioplasty with stent 11/05/15 PCI DES to mLAD  11/06/2015    Past Surgical History:  Procedure Laterality Date  . APPENDECTOMY    . CARDIAC CATHETERIZATION N/A 11/05/2015   Procedure: Left Heart Cath and Coronary Angiography;  Surgeon: Nelva Bush, MD;  Location: [facility];  Service: Cardiovascular;  Laterality: N/A;  . CARDIAC CATHETERIZATION N/A 11/05/2015   Procedure: Coronary  Stent Intervention;  Surgeon: Nelva Bush, MD;  Location: [facility];  Service: Cardiovascular;  Laterality: N/A;  . CARDIAC CATHETERIZATION N/A 11/05/2015   Procedure: Intravascular Pressure Wire/FFR Study;  Surgeon: Nelva Bush, MD;  Location: [facility];  Service: Cardiovascular;  Laterality: N/A;  . CORONARY ANGIOPLASTY WITH STENT PLACEMENT  2010   90% RCA s/p BMS x 2, 50% LAD, 30% CFX, EF 45-50%  . CORONARY ANGIOPLASTY WITH STENT PLACEMENT  11/05/2015   "1 today; makes a total of 4 stents" (11/05/2015)  . INGUINAL HERNIA REPAIR Right   . KNEE ARTHROSCOPY Left X 3  . LAPAROSCOPIC CHOLECYSTECTOMY    . TONSILLECTOMY AND ADENOIDECTOMY      Social History   Social History  . Marital status: Divorced    Spouse name: N/A  . Number of children: N/A  . Years of education: N/A   Occupational History  . Disabled    Social History Main Topics  . Smoking status: Current Every Day Smoker    Packs/day: 0.33    Years: 42.00  . Smokeless tobacco: Never Used     Comment: 11/05/2015 "down from 2ppd to 6 cigarettes/day"  . Alcohol use No  . Drug use: No  . Sexual activity: Yes   Other Topics Concern  . Not on file   Social History Narrative   Lives alone in [place].    Family History  Problem Relation Age of Onset  . Heart Problems Mother     26  . Heart disease Father     75    ROS: no fevers or chills, productive cough, hemoptysis, dysphasia, odynophagia, melena, hematochezia, dysuria, hematuria, rash, seizure activity, orthopnea, PND, pedal edema, claudication. Remaining systems are negative.  Physical Exam: Well-developed well-nourished in no acute distress.  Skin is warm and dry.  HEENT is normal.  Neck is supple.  Chest is clear to auscultation with normal expansion.  Cardiovascular exam is regular rate and rhythm.  Abdominal exam nontender or distended. No masses palpated. Extremities show no edema. neuro grossly intact  ECG

## 2015-12-02 ENCOUNTER — Encounter: Payer: Medicare HMO | Admitting: Cardiology

## 2016-02-23 LAB — LIPID PANEL
CHOLESTEROL: 133 (ref 0–200)
HDL: 34 — AB (ref 35–70)
LDL CALC: 67
TRIGLYCERIDES: 159 (ref 40–160)

## 2016-02-23 LAB — TSH: TSH: 3.21 (ref 0.41–5.90)

## 2016-05-08 LAB — PROTIME-INR: PROTIME: 13.8 s (ref 10.0–13.8)

## 2016-05-08 LAB — POCT INR: INR: 1.1 (ref 0.9–1.1)

## 2016-05-24 HISTORY — PX: UPPER GI ENDOSCOPY: SHX6162

## 2016-06-20 LAB — HEPATIC FUNCTION PANEL
ALT: 9 U/L — AB (ref 10–40)
AST: 12 U/L — AB (ref 14–40)
Alkaline Phosphatase: 86 U/L (ref 25–125)
BILIRUBIN, TOTAL: 0.7 mg/dL

## 2016-06-21 LAB — BASIC METABOLIC PANEL
BUN: 9 mg/dL (ref 4–21)
Creatinine: 1 mg/dL (ref 0.6–1.3)
Potassium: 3.7 mmol/L (ref 3.4–5.3)
SODIUM: 136 mmol/L — AB (ref 137–147)

## 2016-06-21 LAB — CBC AND DIFFERENTIAL
HEMATOCRIT: 39 % — AB (ref 41–53)
Hemoglobin: 13.4 g/dL — AB (ref 13.5–17.5)
PLATELETS: 173 10*3/uL (ref 150–399)
WBC: 9.2 10*3/mL

## 2016-07-04 ENCOUNTER — Non-Acute Institutional Stay (SKILLED_NURSING_FACILITY): Payer: Medicare HMO | Admitting: Internal Medicine

## 2016-07-04 ENCOUNTER — Encounter: Payer: Self-pay | Admitting: Internal Medicine

## 2016-07-04 DIAGNOSIS — M25551 Pain in right hip: Secondary | ICD-10-CM

## 2016-07-04 DIAGNOSIS — G8929 Other chronic pain: Secondary | ICD-10-CM | POA: Diagnosis not present

## 2016-07-04 DIAGNOSIS — E118 Type 2 diabetes mellitus with unspecified complications: Secondary | ICD-10-CM | POA: Diagnosis not present

## 2016-07-04 DIAGNOSIS — M5441 Lumbago with sciatica, right side: Secondary | ICD-10-CM | POA: Diagnosis not present

## 2016-07-04 DIAGNOSIS — M1990 Unspecified osteoarthritis, unspecified site: Secondary | ICD-10-CM

## 2016-07-04 DIAGNOSIS — E034 Atrophy of thyroid (acquired): Secondary | ICD-10-CM

## 2016-07-04 DIAGNOSIS — G894 Chronic pain syndrome: Secondary | ICD-10-CM | POA: Insufficient documentation

## 2016-07-04 DIAGNOSIS — F319 Bipolar disorder, unspecified: Secondary | ICD-10-CM

## 2016-07-04 DIAGNOSIS — K5732 Diverticulitis of large intestine without perforation or abscess without bleeding: Secondary | ICD-10-CM | POA: Insufficient documentation

## 2016-07-04 DIAGNOSIS — I10 Essential (primary) hypertension: Secondary | ICD-10-CM

## 2016-07-04 DIAGNOSIS — M5442 Lumbago with sciatica, left side: Secondary | ICD-10-CM

## 2016-07-04 DIAGNOSIS — J449 Chronic obstructive pulmonary disease, unspecified: Secondary | ICD-10-CM | POA: Diagnosis not present

## 2016-07-04 DIAGNOSIS — E785 Hyperlipidemia, unspecified: Secondary | ICD-10-CM | POA: Diagnosis not present

## 2016-07-04 DIAGNOSIS — I219 Acute myocardial infarction, unspecified: Secondary | ICD-10-CM | POA: Insufficient documentation

## 2016-07-04 NOTE — Progress Notes (Signed)
: Provider:  Noah Delaine. Sheppard Coil, MD Location:  [place] Room Number: 016W Place of Service:  SNF [phone]  PCP: Beckie Salts, MD Patient Care Team: Beckie Salts, MD as PCP - General (Internal Medicine)  Extended Emergency Contact Information Primary Emergency Contact: [place] Mobile Phone: [phone] Relation: Daughter     Allergies: Rocephin [ceftriaxone sodium in dextrose]; Ampicillin; Lipitor [atorvastatin]; Prochlorperazine edisylate; Toradol [ketorolac tromethamine]; and Tramadol  Chief Complaint  Patient presents with  . New Admit To SNF    Admit to Facility    HPI: Patient is 61 y.o. male with  With type 2 diabetes bipolar disorder, constipation, hypothyroid, erosive gastritis, hypertension, smoking, COPD, hyperlipidemia, CAD, felt his right hip pop up proximally 2 weeks prior to admission. The pain felt severe catching sharp and screaming and he fell on his right side. He was able to walk until Friday and then he was then he couldn't. He has been living on his couch and scooting himself to the bathroom; hasn't had anything to eat in 2 days. Home health nurse visited him for his gastritis and advised him to come to the emergency department. Patient was admitted to [facility] from April 30 through May 11 for evaluation and treatment of inability to walk. All studies were negative except for osteoarthritis. It was felt desirable for patient to go to skilled nursing facility, however he remained in the hospital for a prolonged period waiting for insurance authorization. Patient is now admitted to skilled nursing facility for OT/PT. While at skilled nursing facility patient will be followed for hypertension treated with Norvasc and lisinopril bipolar disorder treated with Seroquel and Effexor, and hypothyroidism treated with Synthroid.  Past Medical History:  Diagnosis Date  . Anginal pain ([place])   .  Anxiety   . Arthritis    "right hip; herniated L4-5" (11/05/2015)  . Asthma   . Bipolar 1 disorder ([place])   . Chronic bronchitis ([place])   . Chronic lower back pain   . Chronic pain disorder   . COPD (chronic obstructive pulmonary disease) ([place])   . Coronary artery disease   . Diverticulitis large intestine   . DVT (deep venous thrombosis) ([place]) 1991   "left calf; after 1st knee scope"  . Fungal esophagitis   . GERD (gastroesophageal reflux disease)   . Heart attack ([place]) 2010   BMS x 2 RCA  . High cholesterol   . Hyperlipidemia   . Hypertension   . Hypothyroidism   . MRSA (methicillin resistant staph aureus) culture positive   . Myocardial infarction ([place])   . Pneumonia    "several times"  . S/P angioplasty with stent 11/05/15 PCI DES to mLAD  11/06/2015  . Schizo affective schizophrenia ([place]) 2002    Past Surgical History:  Procedure Laterality Date  . APPENDECTOMY    . CARDIAC CATHETERIZATION N/A 11/05/2015   Procedure: Left Heart Cath and Coronary Angiography;  Surgeon: Nelva Bush, MD;  Location: [facility];  Service: Cardiovascular;  Laterality: N/A;  . CARDIAC CATHETERIZATION N/A 11/05/2015   Procedure: Coronary Stent Intervention;  Surgeon: Nelva Bush, MD;  Location: [facility];  Service: Cardiovascular;  Laterality: N/A;  . CARDIAC CATHETERIZATION N/A 11/05/2015   Procedure: Intravascular Pressure Wire/FFR Study;  Surgeon: Nelva Bush, MD;  Location: [facility];  Service: Cardiovascular;  Laterality: N/A;  . CORONARY ANGIOPLASTY WITH STENT PLACEMENT  2010   90% RCA s/p BMS  x 2, 50% LAD, 30% CFX, EF 45-50%  . CORONARY ANGIOPLASTY WITH STENT PLACEMENT  11/05/2015   "1 today; makes a total of 4 stents" (11/05/2015)  . INGUINAL HERNIA REPAIR Right   . KNEE ARTHROSCOPY Left X 3  . LAPAROSCOPIC CHOLECYSTECTOMY    . TONSILLECTOMY AND ADENOIDECTOMY    . UPPER GI ENDOSCOPY  05/24/2016   UGI ENDO INCLUDE ESOPHAGUS, STOMACH & DUODENUM &/OR  JEJUNUM; DX W/WO COLLECTION SPECIMEN BY [place] OR [place]; SURGEON :ALBERT San Morelle, MD LOCATION; [facility]    Allergies as of 07/04/2016      Reactions   Rocephin [ceftriaxone Sodium In Dextrose] Anaphylaxis   Ampicillin    Lipitor [atorvastatin]    Prochlorperazine Edisylate Other (See Comments)   Childhood Reaction.   Toradol [ketorolac Tromethamine] Nausea And Vomiting   Pt states he is not allergic to ibuprofen   Tramadol Nausea And Vomiting      Medication List       Accurate as of 07/04/16  9:34 AM. Always use your most recent med list.          albuterol 108 (90 Base) MCG/ACT inhaler Commonly known as:  PROVENTIL HFA;VENTOLIN HFA Inhale 2 puffs into the lungs every 6 (six) hours as needed for wheezing.   amLODipine 10 MG tablet Commonly known as:  NORVASC Take 10 mg by mouth daily.   aspirin EC 81 MG tablet Take 81 mg by mouth daily.   Fluticasone-Salmeterol 250-50 MCG/DOSE Aepb Commonly known as:  ADVAIR Inhale 1 puff into the lungs 2 (two) times daily.   gabapentin 300 MG capsule Commonly known as:  NEURONTIN Take 300 mg by mouth 3 (three) times daily.   ibuprofen 800 MG tablet Commonly known as:  ADVIL,MOTRIN Take 800 mg by mouth every 8 (eight) hours as needed.   levothyroxine 25 MCG tablet Commonly known as:  SYNTHROID, LEVOTHROID Take 25 mcg by mouth daily at 6 (six) AM.   lisinopril 5 MG tablet Commonly known as:  PRINIVIL,ZESTRIL Take 5 mg by mouth daily.   metFORMIN 500 MG tablet Commonly known as:  GLUCOPHAGE Take 500 mg by mouth 2 (two) times daily with a meal.   MULTI-VITAMINS Tabs Take 1 tablet by mouth daily.   nitroGLYCERIN 0.4 MG SL tablet Commonly known as:  NITROSTAT Place 0.4 mg under the tongue every 5 (five) minutes as needed for chest pain.   omeprazole 20 MG capsule Commonly known as:  PRILOSEC Take 20 mg by mouth 2 (two) times daily.   Oxycodone HCl 10 MG Tabs Take 10 mg by mouth every 4 (four) hours as  needed.   polyethylene glycol packet Commonly known as:  MIRALAX / GLYCOLAX Take 17 g by mouth daily as needed for mild constipation.   promethazine 25 MG tablet Commonly known as:  PHENERGAN Take 1 tablet (25 mg total) by mouth every 6 (six) hours as needed for nausea or vomiting.   QUEtiapine 50 MG tablet Commonly known as:  SEROQUEL Take 50 mg by mouth 4 (four) times daily.   simvastatin 20 MG tablet Commonly known as:  ZOCOR Take 20 mg by mouth at bedtime.   temazepam 30 MG capsule Commonly known as:  RESTORIL Take 30 mg by mouth at bedtime.   tiZANidine 2 MG tablet Commonly known as:  ZANAFLEX Take 2 mg by mouth every 6 (six) hours as needed for muscle spasms.   venlafaxine 75 MG tablet Commonly known as:  EFFEXOR Take 75 mg by mouth daily.  Meds ordered this encounter  Medications  . albuterol (PROVENTIL HFA;VENTOLIN HFA) 108 (90 Base) MCG/ACT inhaler    Sig: Inhale 2 puffs into the lungs every 6 (six) hours as needed for wheezing.  Marland Kitchen amLODipine (NORVASC) 10 MG tablet    Sig: Take 10 mg by mouth daily.  Marland Kitchen aspirin EC 81 MG tablet    Sig: Take 81 mg by mouth daily.  . Fluticasone-Salmeterol (ADVAIR) 250-50 MCG/DOSE AEPB    Sig: Inhale 1 puff into the lungs 2 (two) times daily.  Marland Kitchen gabapentin (NEURONTIN) 300 MG capsule    Sig: Take 300 mg by mouth 3 (three) times daily.  Marland Kitchen ibuprofen (ADVIL,MOTRIN) 800 MG tablet    Sig: Take 800 mg by mouth every 8 (eight) hours as needed.  Marland Kitchen lisinopril (PRINIVIL,ZESTRIL) 5 MG tablet    Sig: Take 5 mg by mouth daily.  . QUEtiapine (SEROQUEL) 50 MG tablet    Sig: Take 50 mg by mouth 4 (four) times daily.  Marland Kitchen omeprazole (PRILOSEC) 20 MG capsule    Sig: Take 20 mg by mouth 2 (two) times daily.  . Oxycodone HCl 10 MG TABS    Sig: Take 10 mg by mouth every 4 (four) hours as needed.  . simvastatin (ZOCOR) 20 MG tablet    Sig: Take 20 mg by mouth at bedtime.  Marland Kitchen tiZANidine (ZANAFLEX) 2 MG tablet    Sig: Take 2 mg by mouth every  6 (six) hours as needed for muscle spasms.  Marland Kitchen venlafaxine (EFFEXOR) 75 MG tablet    Sig: Take 75 mg by mouth daily.  . metFORMIN (GLUCOPHAGE) 500 MG tablet    Sig: Take 500 mg by mouth 2 (two) times daily with a meal.    Immunization History  Administered Date(s) Administered  . Influenza Nasal 11/23/2015  . Influenza, Seasonal, Injecte, Preservative Fre 11/22/2010, 12/12/2012, 02/15/2014  . Influenza,inj,Quad PF,36+ Mos 11/26/2012, 01/17/2015, 11/06/2015  . Influenza-Unspecified 02/26/2014  . Pneumococcal Polysaccharide-23 02/22/2008, 11/26/2012, 12/12/2012    Social History  Substance Use Topics  . Smoking status: Light Tobacco Smoker    Packs/day: 0.25    Years: 42.00    Types: Cigarettes  . Smokeless tobacco: Never Used     Comment: last attempt to quit 04/12/16, down to 6 cigs daily   . Alcohol use No    Family history is   Family History  Problem Relation Age of Onset  . Heart Problems Mother        70  . Heart disease Father        22      Review of Systems  DATA OBTAINED: from patient GENERAL:  no fevers, fatigue, appetite changes SKIN: No itching, or rash EYES: No eye pain, redness, discharge EARS: No earache, tinnitus, change in hearing NOSE: No congestion, drainage or bleeding  MOUTH/THROAT: No mouth or tooth pain, No sore throat RESPIRATORY: No cough, wheezing, SOB CARDIAC: No chest pain, palpitations, lower extremity edema  GI: No abdominal pain, No N/V/D or constipation, No heartburn or reflux  GU: No dysuria, frequency or urgency, or incontinence  MUSCULOSKELETAL: No unrelieved bone/joint pain NEUROLOGIC: No headache, dizziness or focal weakness PSYCHIATRIC: No c/o anxiety or sadness   Vitals:   07/04/16 0849  BP: 117/76  Pulse: 74  Resp: 18  Temp: 98 F (36.7 C)    SpO2 Readings from Last 1 Encounters:  11/09/15 99%   Body mass index is 34.58 kg/m.     Physical Exam  GENERAL APPEARANCE: Alert, conversant,  No acute distress.    SKIN: No diaphoresis rash HEAD: Normocephalic, atraumatic  EYES: Conjunctiva/lids clear. Pupils round, reactive. EOMs intact.  EARS: External exam WNL, canals clear. Hearing grossly normal.  NOSE: No deformity or discharge.  MOUTH/THROAT: Lips w/o lesions  RESPIRATORY: Breathing is even, unlabored. Lung sounds are clear   CARDIOVASCULAR: Heart RRR no murmurs, rubs or gallops. No peripheral edema.   GASTROINTESTINAL: Abdomen is soft, non-tender, not distended w/ normal bowel sounds. GENITOURINARY: Bladder non tender, not distended  MUSCULOSKELETAL: No abnormal joints or musculature NEUROLOGIC:  Cranial nerves 2-12 grossly intact. Moves all extremities  PSYCHIATRIC: Mood and affect appropriate to situation, no behavioral issues  Patient Active Problem List   Diagnosis Date Noted  . Chronic pain disorder   . Diverticulitis large intestine   . Hyperlipidemia   . Myocardial infarction ([place])   . S/P angioplasty with stent 11/05/15 PCI DES to mLAD  11/06/2015  . Ischemic chest pain   . Status post cholecystectomy 09/25/2014  . Chest pain 09/24/2014  . COPD exacerbation ([place]) 09/24/2014  . Chest pain of uncertain etiology   . Hyperlipidemia LDL goal <70 12/05/2008  . Obesity 12/05/2008  . DEPRESSION, MAJOR 12/05/2008  . BIPOLAR DISORDER UNSPECIFIED 12/05/2008  . TOBACCO ABUSE 12/05/2008  . HYPERTENSION, UNSPECIFIED 12/05/2008  . MYOCARDIAL INFARCTION, HX OF 12/05/2008  . Coronary atherosclerosis 12/05/2008  . COPD 12/05/2008  . PULMONARY NODULE, LEFT LOWER LOBE 12/05/2008  . GERD 12/05/2008  . EROSIVE GASTRITIS 12/05/2008  . DEEP VENOUS THROMBOPHLEBITIS, HX OF 12/05/2008      Labs reviewed: Basic Metabolic Panel:    Component Value Date/Time   NA 136 (A) 06/21/2016   K 3.7 06/21/2016   CL 100 (L) 11/09/2015 0523   CO2 22 11/09/2015 0523   GLUCOSE 117 (H) 11/09/2015 0523   BUN 9 06/21/2016   CREATININE 1.0 06/21/2016   CREATININE 0.73 11/09/2015 0523   CALCIUM 9.3  11/09/2015 0523   PROT 7.2 12/17/2014 2054   ALBUMIN 4.0 12/17/2014 2054   AST 12 (A) 06/20/2016   ALT 9 (A) 06/20/2016   ALKPHOS 86 06/20/2016   BILITOT 0.5 12/17/2014 2054   GFRNONAA >60 11/09/2015 0523   GFRAA >60 11/09/2015 0523     Recent Labs  11/05/15 0530  11/06/15 0420 11/09/15 0523 06/21/16  NA 135  --  135 134* 136*  K 4.0  --  4.2 3.8 3.7  CL 101  --  105 100*  --   CO2 25  --  21* 22  --   GLUCOSE 100*  --  112* 117*  --   BUN 13  --  '13 8 9  '$ CREATININE 0.73  < > 0.81 0.73 1.0  CALCIUM 9.3  --  8.9 9.3  --   < > = values in this interval not displayed. Liver Function Tests:  Recent Labs  06/20/16  AST 12*  ALT 9*  ALKPHOS 86   No results for input(s): LIPASE, AMYLASE in the last 8760 hours. No results for input(s): AMMONIA in the last 8760 hours. CBC:  Recent Labs  11/05/15 1713 11/06/15 0420 11/09/15 0523 06/21/16  WBC 17.2* 13.4* 10.9* 9.2  HGB 13.7 12.7* 14.3 13.4*  HCT 41.1 38.9* 42.9 39*  MCV 85.4 86.4 85.5  --   PLT 215 187 197 173   Lipid No results for input(s): CHOL, HDL, LDLCALC, TRIG in the last 8760 hours.  Cardiac Enzymes:  Recent Labs  11/05/15 0530 11/05/15 0830  TROPONINI <0.03 <0.03  BNP:  Recent Labs  11/05/15 0530 11/09/15 0523  BNP 6.0 7.3   No results found for: [facility] Lab Results  Component Value Date   HGBA1C  11/19/2008    5.2 (NOTE) The ADA recommends the following therapeutic goal for glycemic control related to Hgb A1c measurement: Goal of therapy: <6.5 Hgb A1c  Reference: American Diabetes Association: Clinical Practice Recommendations 2010, Diabetes Care, 2010, 33: (Suppl  1).   TSH 2.3 No results found for: VITAMINB12 No results found for: FOLATE No results found for: IRON, TIBC, FERRITIN  Imaging and Procedures obtained prior to SNF admission: Dg Chest 2 View  Result Date: 11/09/2015 CLINICAL DATA:  61 y/o  M; chest pain with shortness of breath. EXAM: CHEST  2 VIEW COMPARISON:   11/05/2015 chest radiograph. FINDINGS: Stable cardiomediastinal silhouette given differences in technique. Clear lungs. No pneumothorax. No pleural effusion. No acute osseous abnormality is evident. Mild multilevel degenerative changes of the spine. IMPRESSION: Clear lungs. Electronically Signed   By: Kristine Garbe M.D.   On: 11/09/2015 05:41     Not all labs, radiology exams or other studies done during hospitalization come through on my EPIC note; however they are reviewed by me.    Assessment and Plan  INABILITY TO AMBULATE 2/2 R HIP PAIN- x-rays and him are I of the hip were consistent with osteoarthritis no other acute findings. The patient will need reviewed rehabilitation for gait mobility and balance and transfers. SNF - pt admitted for OT/PT  CHRONIC PAIN - mananged by pain clinic and on chronic oxycodone SNF - pt says he is on oxycodone 10 mg q4 hours scheduled per his pain clinic; I will cont this for pt at SNF  BIPOLAR DISORDER - CONTROLLED SNF - cont seroquel 50 mg QID and effexor 75 mg daily  HTN SNF - controlled ; cont lisinopril 5 mg daily and norvasc 10 mg daily  HYPOTHYROIDISM SNF - not stated as uncontrolled ; cont synthroid 25 mg daily  COPD, not in exacerbation SNF - cont with proventil , prn, advair diskus 1 puff BID,   DM2 - A1c 6.4, metformin started in hosp SNF - cont glucophage 500 mg BID  HLD SNF - not stated as uncontrolled; cont zocor 20 mg daily   Time spent > 45 min;> 50% of time with patient was spent reviewing records, labs, tests and studies, counseling and developing plan of care  [place] Silversmith D. Sheppard Coil, MD

## 2016-07-06 ENCOUNTER — Encounter: Payer: Self-pay | Admitting: Internal Medicine

## 2016-07-06 DIAGNOSIS — E039 Hypothyroidism, unspecified: Secondary | ICD-10-CM | POA: Insufficient documentation

## 2016-07-06 DIAGNOSIS — M25551 Pain in right hip: Secondary | ICD-10-CM | POA: Insufficient documentation

## 2016-07-06 DIAGNOSIS — M1990 Unspecified osteoarthritis, unspecified site: Secondary | ICD-10-CM | POA: Insufficient documentation

## 2016-07-06 DIAGNOSIS — E118 Type 2 diabetes mellitus with unspecified complications: Secondary | ICD-10-CM | POA: Insufficient documentation

## 2016-07-06 DIAGNOSIS — G8929 Other chronic pain: Secondary | ICD-10-CM | POA: Insufficient documentation

## 2016-07-06 DIAGNOSIS — E119 Type 2 diabetes mellitus without complications: Secondary | ICD-10-CM | POA: Insufficient documentation

## 2016-07-06 DIAGNOSIS — M545 Low back pain: Secondary | ICD-10-CM

## 2016-07-06 HISTORY — DX: Type 2 diabetes mellitus with unspecified complications: E11.8

## 2016-07-22 ENCOUNTER — Encounter: Payer: Self-pay | Admitting: Internal Medicine

## 2016-07-22 ENCOUNTER — Non-Acute Institutional Stay (SKILLED_NURSING_FACILITY): Payer: Medicare HMO | Admitting: Internal Medicine

## 2016-07-22 DIAGNOSIS — E118 Type 2 diabetes mellitus with unspecified complications: Secondary | ICD-10-CM | POA: Diagnosis not present

## 2016-07-22 DIAGNOSIS — M25551 Pain in right hip: Secondary | ICD-10-CM | POA: Diagnosis not present

## 2016-07-22 DIAGNOSIS — M5441 Lumbago with sciatica, right side: Secondary | ICD-10-CM | POA: Diagnosis not present

## 2016-07-22 DIAGNOSIS — I10 Essential (primary) hypertension: Secondary | ICD-10-CM

## 2016-07-22 DIAGNOSIS — G8929 Other chronic pain: Secondary | ICD-10-CM

## 2016-07-22 DIAGNOSIS — J449 Chronic obstructive pulmonary disease, unspecified: Secondary | ICD-10-CM

## 2016-07-22 DIAGNOSIS — F319 Bipolar disorder, unspecified: Secondary | ICD-10-CM | POA: Diagnosis not present

## 2016-07-22 DIAGNOSIS — M5442 Lumbago with sciatica, left side: Secondary | ICD-10-CM | POA: Diagnosis not present

## 2016-07-22 DIAGNOSIS — G894 Chronic pain syndrome: Secondary | ICD-10-CM | POA: Diagnosis not present

## 2016-07-22 DIAGNOSIS — E034 Atrophy of thyroid (acquired): Secondary | ICD-10-CM | POA: Diagnosis not present

## 2016-07-22 NOTE — Progress Notes (Signed)
Location:  [facility] Room Number: 846N Place of Service:  SNF (31) [name]. Sheppard Coil, MD  PCP: Beckie Salts, MD Patient Care Team: Beckie Salts, MD as PCP - General (Internal Medicine)  Extended Emergency Contact Information Primary Emergency Contact: [name] Mobile Phone: [phone] Relation: Daughter  Allergies  Allergen Reactions  . Rocephin [Ceftriaxone Sodium In Dextrose] Anaphylaxis  . Ampicillin   . Lipitor [Atorvastatin]   . Prochlorperazine Edisylate Other (See Comments)    Childhood Reaction.  . Toradol [Ketorolac Tromethamine] Nausea And Vomiting    Pt states he is not allergic to ibuprofen  . Tramadol Nausea And Vomiting    Chief Complaint  Patient presents with  . Discharge Note    Discharged from SNF    HPI:  61 y.o. male  With type 2 diabetes bipolar disorder, constipation, hypothyroid, erosive gastritis, hypertension, smoking, COPD, hyperlipidemia, CAD, felt his right hip pop up proximally 2 weeks prior to admission. The pain felt severe catching sharp and screaming and he fell on his right side. He was able to walk until Friday and then he was then he couldn't. He has been living on his couch and scooting himself to the bathroom; hasn't had anything to eat in 2 days. Home health nurse visited him for his gastritis and advised him to come to the emergency department. Patient was admitted to [facility] from April 30 through May 11 for evaluation and treatment of inability to walk. All studies were negative except for osteoarthritis. It was felt desirable for patient to go to skilled nursing facility, however he remained in the hospital for a prolonged period waiting for insurance authorization. Patient is now admitted to skilled nursing facility for OT/PT. Pt is now ready to be d/c to home    Past Medical History:  Diagnosis Date  . Anginal pain ([place])   . Anxiety   .  Arthritis    "right hip; herniated L4-5" (11/05/2015)  . Asthma   . Bipolar 1 disorder ([place])   . Chronic bronchitis ([place])   . Chronic lower back pain   . Chronic pain disorder   . COPD (chronic obstructive pulmonary disease) ([place])   . Coronary artery disease   . Diverticulitis large intestine   . DVT (deep venous thrombosis) ([place]) 1991   "left calf; after 1st knee scope"  . Fungal esophagitis   . GERD (gastroesophageal reflux disease)   . Heart attack ([place]) 2010   BMS x 2 RCA  . High cholesterol   . Hyperlipidemia   . Hypertension   . Hypothyroidism   . MRSA (methicillin resistant staph aureus) culture positive   . Myocardial infarction ([place])   . Pneumonia    "several times"  . S/P angioplasty with stent 11/05/15 PCI DES to mLAD  11/06/2015  . Schizo affective schizophrenia ([place]) 2002    Past Surgical History:  Procedure Laterality Date  . APPENDECTOMY    . CARDIAC CATHETERIZATION N/A 11/05/2015   Procedure: Left Heart Cath and Coronary Angiography;  Surgeon: Nelva Bush, MD;  Location: [facility];  Service: Cardiovascular;  Laterality: N/A;  . CARDIAC CATHETERIZATION N/A 11/05/2015   Procedure: Coronary Stent Intervention;  Surgeon: Nelva Bush, MD;  Location: [facility];  Service: Cardiovascular;  Laterality: N/A;  . CARDIAC CATHETERIZATION N/A 11/05/2015   Procedure: Intravascular Pressure Wire/FFR Study;  Surgeon: Nelva Bush, MD;  Location: [facility];  Service: Cardiovascular;  Laterality: N/A;  . CORONARY ANGIOPLASTY WITH STENT PLACEMENT  2010   90% RCA s/p BMS x 2, 50% LAD, 30% CFX, EF 45-50%  . CORONARY ANGIOPLASTY WITH STENT PLACEMENT  11/05/2015   "1 today; makes a total of 4 stents" (11/05/2015)  . INGUINAL HERNIA REPAIR Right   . KNEE ARTHROSCOPY Left X 3  . LAPAROSCOPIC CHOLECYSTECTOMY    . TONSILLECTOMY AND ADENOIDECTOMY    . UPPER GI ENDOSCOPY  05/24/2016   UGI ENDO INCLUDE ESOPHAGUS, STOMACH & DUODENUM &/OR JEJUNUM; DX W/WO  COLLECTION SPECIMEN BY BRUSH OR Alpha; SURGEON :ALBERT San Morelle, MD LOCATION; [place]     reports that he has been smoking Cigarettes.  He has a 10.50 pack-year smoking history. He has never used smokeless tobacco. He reports that he does not drink alcohol or use drugs. Social History   Social History  . Marital status: Divorced    Spouse name: N/A  . Number of children: N/A  . Years of education: N/A   Occupational History  . Disabled    Social History Main Topics  . Smoking status: Light Tobacco Smoker    Packs/day: 0.25    Years: 42.00    Types: Cigarettes  . Smokeless tobacco: Never Used     Comment: last attempt to quit 04/12/16, down to 6 cigs daily   . Alcohol use No  . Drug use: No  . Sexual activity: Yes   Other Topics Concern  . Not on file   Social History Narrative   Lives alone in [place].    Pertinent  Health Maintenance Due  Topic Date Due  . FOOT EXAM  07/12/1964  . OPHTHALMOLOGY EXAM  07/12/1964  . COLONOSCOPY  07/12/2004  . HEMOGLOBIN A1C  05/19/2009  . INFLUENZA VACCINE  09/21/2016    Medications: Allergies as of 07/22/2016      Reactions   Rocephin [ceftriaxone Sodium In Dextrose] Anaphylaxis   Ampicillin    Lipitor [atorvastatin]    Prochlorperazine Edisylate Other (See Comments)   Childhood Reaction.   Toradol [ketorolac Tromethamine] Nausea And Vomiting   Pt states he is not allergic to ibuprofen   Tramadol Nausea And Vomiting      Medication List       Accurate as of 07/22/16  1:55 PM. Always use your most recent med list.          albuterol 108 (90 Base) MCG/ACT inhaler Commonly known as:  PROVENTIL HFA;VENTOLIN HFA Inhale 2 puffs into the lungs every 6 (six) hours as needed for wheezing.   amLODipine 10 MG tablet Commonly known as:  NORVASC Take 10 mg by mouth daily.   aspirin EC 81 MG tablet Take 81 mg by mouth daily.   Fluticasone-Salmeterol 250-50 MCG/DOSE Aepb Commonly known as:  ADVAIR Inhale 1  puff into the lungs 2 (two) times daily.   gabapentin 300 MG capsule Commonly known as:  NEURONTIN Take 300 mg by mouth 3 (three) times daily.   ibuprofen 800 MG tablet Commonly known as:  ADVIL,MOTRIN Take 800 mg by mouth every 8 (eight) hours as needed.   levothyroxine 25 MCG tablet Commonly known as:  SYNTHROID, LEVOTHROID Take 25 mcg by mouth daily at 6 (six) AM.   lisinopril 5 MG tablet Commonly known as:  PRINIVIL,ZESTRIL Take 5 mg by mouth daily.   metFORMIN 500 MG tablet Commonly known as:  GLUCOPHAGE Take 500 mg by mouth 2 (two) times daily with a meal.   MULTI-VITAMINS Tabs Take 1 tablet  by mouth daily.   nitroGLYCERIN 0.4 MG SL tablet Commonly known as:  NITROSTAT Place 0.4 mg under the tongue every 5 (five) minutes as needed for chest pain.   omeprazole 20 MG capsule Commonly known as:  PRILOSEC Take 20 mg by mouth 2 (two) times daily.   Oxycodone HCl 10 MG Tabs Take 10 mg by mouth every 4 (four) hours.   polyethylene glycol packet Commonly known as:  MIRALAX / GLYCOLAX Take 17 g by mouth daily as needed for mild constipation.   promethazine 25 MG tablet Commonly known as:  PHENERGAN Take 1 tablet (25 mg total) by mouth every 6 (six) hours as needed for nausea or vomiting.   QUEtiapine 50 MG tablet Commonly known as:  SEROQUEL Take 50 mg by mouth 4 (four) times daily.   simvastatin 20 MG tablet Commonly known as:  ZOCOR Take 20 mg by mouth at bedtime.   temazepam 30 MG capsule Commonly known as:  RESTORIL Take 30 mg by mouth at bedtime.   tiZANidine 4 MG tablet Commonly known as:  ZANAFLEX Take 4 mg by mouth every 6 (six) hours.   venlafaxine 75 MG tablet Commonly known as:  EFFEXOR Take 75 mg by mouth daily.        Vitals:   07/22/16 1002  BP: (!) 148/58  Pulse: 77  Resp: 16  Temp: 98.5 F (36.9 C)  SpO2: 92%  Weight: 254 lb 9.6 oz (115.5 kg)  Height: 6' (1.829 m)   Body mass index is 34.53 kg/m.  Physical Exam  GENERAL  APPEARANCE: Alert, conversant. No acute distress.  HEENT: Unremarkable. RESPIRATORY: Breathing is even, unlabored. Lung sounds are clear   CARDIOVASCULAR: Heart RRR no murmurs, rubs or gallops. No peripheral edema.  GASTROINTESTINAL: Abdomen is soft, non-tender, not distended w/ normal bowel sounds.  NEUROLOGIC: Cranial nerves 2-12 grossly intact. Moves all extremities   Labs reviewed: Basic Metabolic Panel:  Recent Labs  11/05/15 0530  11/06/15 0420 11/09/15 0523 06/21/16  NA 135  --  135 134* 136*  K 4.0  --  4.2 3.8 3.7  CL 101  --  105 100*  --   CO2 25  --  21* 22  --   GLUCOSE 100*  --  112* 117*  --   BUN 13  --  13 8 9   CREATININE 0.73  < > 0.81 0.73 1.0  CALCIUM 9.3  --  8.9 9.3  --   < > = values in this interval not displayed. No results found for: [facility] Liver Function Tests:  Recent Labs  06/20/16  AST 12*  ALT 9*  ALKPHOS 86   No results for input(s): LIPASE, AMYLASE in the last 8760 hours. No results for input(s): AMMONIA in the last 8760 hours. CBC:  Recent Labs  11/05/15 1713 11/06/15 0420 11/09/15 0523 06/21/16  WBC 17.2* 13.4* 10.9* 9.2  HGB 13.7 12.7* 14.3 13.4*  HCT 41.1 38.9* 42.9 39*  MCV 85.4 86.4 85.5  --   PLT 215 187 197 173   Lipid No results for input(s): CHOL, HDL, LDLCALC, TRIG in the last 8760 hours. Cardiac Enzymes:  Recent Labs  11/05/15 0530 11/05/15 0830  TROPONINI <0.03 <0.03   BNP:  Recent Labs  11/05/15 0530 11/09/15 0523  BNP 6.0 7.3   CBG: No results for input(s): GLUCAP in the last 8760 hours.  Procedures and Imaging Studies During Stay: No results found.  Assessment/Plan:   Hip pain, acute, right  Chronic pain disorder  Chronic midline low back pain with bilateral sciatica  Bipolar affective disorder, remission status unspecified (HCC)  Hypothyroidism due to acquired atrophy of thyroid  Type 2 diabetes mellitus with complication, without long-term current use of insulin (HCC)  Chronic  obstructive pulmonary disease, unspecified COPD type ([place])  Essential hypertension   Patient is being discharged with the following home health services:  OT/PT/Nursing  Patient is being discharged with the following durable medical equipment:  Rolling walker  Patient has been advised to f/u with their PCP in 1-2 weeks to bring them up to date on their rehab stay.  Social services at facility was responsible for arranging this appointment.  Pt was provided with a 30 day supply of prescriptions for medications and refills must be obtained from their PCP.  For controlled substances, a more limited supply may be provided adequate until PCP appointment only.  Medications have been reconciled .  Time spent > 30 min;> 50% of time with patient was spent reviewing records, labs, tests and studies, counseling and developing plan of care  [name]. Sheppard Coil, MD

## 2016-08-04 LAB — PROTIME-INR: Protime: 13.8 (ref 10.0–13.8)

## 2016-08-04 LAB — CBC AND DIFFERENTIAL
HEMATOCRIT: 36 — AB (ref 41–53)
HEMOGLOBIN: 12.7 — AB (ref 13.5–17.5)
PLATELETS: 256 (ref 150–399)
WBC: 12.5

## 2016-08-04 LAB — HEPATIC FUNCTION PANEL
ALT: 12 (ref 10–40)
AST: 13 — AB (ref 14–40)
Alkaline Phosphatase: 84 (ref 25–125)
Bilirubin, Total: 0.3

## 2016-08-04 LAB — BASIC METABOLIC PANEL
BUN: 5 (ref 4–21)
Creatinine: 0.6 (ref 0.6–1.3)
Glucose: 127
Potassium: 3.3 — AB (ref 3.4–5.3)
Sodium: 139 (ref 137–147)

## 2016-08-04 LAB — POCT INR: INR: 1.1 (ref 0.9–1.1)

## 2016-08-05 LAB — CBC AND DIFFERENTIAL
HEMATOCRIT: 34 — AB (ref 41–53)
Hemoglobin: 11.4 — AB (ref 13.5–17.5)
Platelets: 200 (ref 150–399)
WBC: 9.2

## 2016-08-05 LAB — BASIC METABOLIC PANEL
BUN: 9 (ref 4–21)
BUN: 9 (ref 4–21)
CREATININE: 0.8 (ref 0.6–1.3)
Creatinine: 0.8 (ref 0.6–1.3)
GLUCOSE: 90
Glucose: 90
POTASSIUM: 3.6 (ref 3.4–5.3)
Potassium: 3.6 (ref 3.4–5.3)
Sodium: 137 (ref 137–147)
Sodium: 137 (ref 137–147)

## 2016-08-07 LAB — BASIC METABOLIC PANEL
BUN: 18 (ref 4–21)
CREATININE: 0.9 (ref 0.6–1.3)
GLUCOSE: 97
Potassium: 3.9 (ref 3.4–5.3)
Sodium: 134 — AB (ref 137–147)

## 2016-08-09 ENCOUNTER — Non-Acute Institutional Stay (SKILLED_NURSING_FACILITY): Payer: Medicare HMO | Admitting: Internal Medicine

## 2016-08-09 ENCOUNTER — Encounter: Payer: Self-pay | Admitting: Internal Medicine

## 2016-08-09 DIAGNOSIS — G8929 Other chronic pain: Secondary | ICD-10-CM | POA: Diagnosis not present

## 2016-08-09 DIAGNOSIS — E118 Type 2 diabetes mellitus with unspecified complications: Secondary | ICD-10-CM | POA: Diagnosis not present

## 2016-08-09 DIAGNOSIS — I25118 Atherosclerotic heart disease of native coronary artery with other forms of angina pectoris: Secondary | ICD-10-CM | POA: Diagnosis not present

## 2016-08-09 DIAGNOSIS — I2089 Other forms of angina pectoris: Secondary | ICD-10-CM

## 2016-08-09 DIAGNOSIS — M5442 Lumbago with sciatica, left side: Secondary | ICD-10-CM | POA: Diagnosis not present

## 2016-08-09 DIAGNOSIS — I208 Other forms of angina pectoris: Secondary | ICD-10-CM | POA: Diagnosis not present

## 2016-08-09 DIAGNOSIS — M5441 Lumbago with sciatica, right side: Secondary | ICD-10-CM

## 2016-08-09 DIAGNOSIS — J449 Chronic obstructive pulmonary disease, unspecified: Secondary | ICD-10-CM

## 2016-08-09 DIAGNOSIS — G894 Chronic pain syndrome: Secondary | ICD-10-CM | POA: Diagnosis not present

## 2016-08-09 DIAGNOSIS — E785 Hyperlipidemia, unspecified: Secondary | ICD-10-CM

## 2016-08-09 NOTE — Progress Notes (Signed)
: Provider:  Noah Delaine. Sheppard Coil, MD Location:  [place] Room Number: 100P Place of Service:  SNF ([phone])  PCP: Beckie Salts, MD Patient Care Team: Beckie Salts, MD as PCP - General (Internal Medicine)  Extended Emergency Contact Information Primary Emergency Contact: [place] [employer] [place] Mobile Phone: [phone] Relation: Daughter     Allergies: Rocephin [ceftriaxone sodium in dextrose]; Ampicillin; Lipitor [atorvastatin]; Prochlorperazine edisylate; Toradol [ketorolac tromethamine]; and Tramadol  Chief Complaint  Patient presents with  . Readmit To SNF    Admit to Facility    HPI: Patient is 61 y.o. male with History of coronary artery disease, slowly OPD, hypertension and diabetes who presented [facility] emergency department for evaluation of chest pain and shortness of breath. Evaluation in the ED was consistent with typical chest pain and patient was admitted for to [facility] from 6/14-18 for evaluation. Workup was negative. Hospital course was complicated by his chronic issues of back pain and right hip pain for which patient goes to a pain clinic. Decided the patient should be admitted to a skilled nursing facility for generalized weakness. While at skilled nursing facility patient will be followed for chronic pain treated with oxycodone, and Neurontin and Zanaflex, diabetes mellitus 2 treated with Glucophage and hyperlipidemia treated with Zocor.  Past Medical History:  Diagnosis Date  . Anginal pain ([place])   . Anxiety   . Arthritis    "right hip; herniated L4-5" (11/05/2015)  . Asthma   . Bipolar 1 disorder ([place])   . Chronic bronchitis ([place])   . Chronic lower back pain   . Chronic pain disorder   . COPD (chronic obstructive pulmonary disease) ([place])   . Coronary artery disease   . Diverticulitis large intestine   . DVT (deep venous thrombosis) ([place]) 1991   "left calf; after 1st knee  scope"  . Fungal esophagitis   . GERD (gastroesophageal reflux disease)   . Heart attack ([place]) 2010   BMS x 2 RCA  . High cholesterol   . Hyperlipidemia   . Hypertension   . Hypothyroidism   . MRSA (methicillin resistant staph aureus) culture positive   . Myocardial infarction ([place])   . Pneumonia    "several times"  . S/P angioplasty with stent 11/05/15 PCI DES to mLAD  11/06/2015  . Schizo affective schizophrenia ([place]) 2002    Past Surgical History:  Procedure Laterality Date  . APPENDECTOMY    . CARDIAC CATHETERIZATION N/A 11/05/2015   Procedure: Left Heart Cath and Coronary Angiography;  Surgeon: Nelva Bush, MD;  Location: [place] CV LAB;  Service: Cardiovascular;  Laterality: N/A;  . CARDIAC CATHETERIZATION N/A 11/05/2015   Procedure: Coronary Stent Intervention;  Surgeon: Nelva Bush, MD;  Location: [place] CV LAB;  Service: Cardiovascular;  Laterality: N/A;  . CARDIAC CATHETERIZATION N/A 11/05/2015   Procedure: Intravascular Pressure Wire/FFR Study;  Surgeon: Nelva Bush, MD;  Location: [place] CV LAB;  Service: Cardiovascular;  Laterality: N/A;  . CORONARY ANGIOPLASTY WITH STENT PLACEMENT  2010   90% RCA s/p BMS x 2, 50% LAD, 30% CFX, EF 45-50%  . CORONARY ANGIOPLASTY WITH STENT PLACEMENT  11/05/2015   "1 today; makes a total of 4 stents" (11/05/2015)  . INGUINAL HERNIA REPAIR Right   . KNEE ARTHROSCOPY Left X 3  . LAPAROSCOPIC CHOLECYSTECTOMY    . TONSILLECTOMY AND ADENOIDECTOMY    . UPPER GI ENDOSCOPY  05/24/2016   UGI ENDO INCLUDE ESOPHAGUS,  STOMACH & DUODENUM &/OR JEJUNUM; DX W/WO COLLECTION SPECIMEN BY [facility] OR [place]; SURGEON :ALBERT San Morelle, MD LOCATION; [facility]    Allergies as of 08/09/2016      Reactions   Rocephin [ceftriaxone Sodium In Dextrose] Anaphylaxis   Ampicillin    Lipitor [atorvastatin]    Prochlorperazine Edisylate Other (See Comments)   Childhood Reaction.   Toradol [ketorolac Tromethamine] Nausea And Vomiting    Pt states he is not allergic to ibuprofen   Tramadol Nausea And Vomiting      Medication List       Accurate as of 08/09/16 11:18 AM. Always use your most recent med list.          albuterol 108 (90 Base) MCG/ACT inhaler Commonly known as:  PROVENTIL HFA;VENTOLIN HFA Inhale 2 puffs into the lungs every 6 (six) hours as needed for wheezing.   amLODipine 10 MG tablet Commonly known as:  NORVASC Take 10 mg by mouth daily.   aspirin EC 81 MG tablet Take 81 mg by mouth daily.   Fluticasone-Salmeterol 250-50 MCG/DOSE Aepb Commonly known as:  ADVAIR Inhale 1 puff into the lungs 2 (two) times daily.   gabapentin 300 MG capsule Commonly known as:  NEURONTIN Take 300 mg by mouth 3 (three) times daily.   ibuprofen 800 MG tablet Commonly known as:  ADVIL,MOTRIN Take 800 mg by mouth every 8 (eight) hours as needed.   levothyroxine 25 MCG tablet Commonly known as:  SYNTHROID, LEVOTHROID Take 25 mcg by mouth daily at 6 (six) AM.   lisinopril 5 MG tablet Commonly known as:  PRINIVIL,ZESTRIL Take 5 mg by mouth daily.   metFORMIN 500 MG tablet Commonly known as:  GLUCOPHAGE Take 500 mg by mouth 2 (two) times daily with a meal.   MULTI-VITAMINS Tabs Take 1 tablet by mouth daily.   nitroGLYCERIN 0.4 MG SL tablet Commonly known as:  NITROSTAT Place 0.4 mg under the tongue every 5 (five) minutes as needed for chest pain.   omeprazole 20 MG capsule Commonly known as:  PRILOSEC Take 20 mg by mouth 2 (two) times daily.   Oxycodone HCl 10 MG Tabs Take 10 mg by mouth every 6 (six) hours as needed.   polyethylene glycol packet Commonly known as:  MIRALAX / GLYCOLAX Take 17 g by mouth daily as needed for mild constipation.   promethazine 25 MG tablet Commonly known as:  PHENERGAN Take 1 tablet (25 mg total) by mouth every 6 (six) hours as needed for nausea or vomiting.   QUEtiapine 50 MG tablet Commonly known as:  SEROQUEL Take 50 mg by mouth 4 (four) times daily.     simvastatin 20 MG tablet Commonly known as:  ZOCOR Take 20 mg by mouth at bedtime.   temazepam 30 MG capsule Commonly known as:  RESTORIL Take 30 mg by mouth at bedtime.   tiZANidine 4 MG tablet Commonly known as:  ZANAFLEX Take 4 mg by mouth every 6 (six) hours.   venlafaxine 75 MG tablet Commonly known as:  EFFEXOR Take 75 mg by mouth daily.       No orders of the defined types were placed in this encounter.   Immunization History  Administered Date(s) Administered  . Influenza Nasal 11/23/2015  . Influenza, Seasonal, Injecte, Preservative Fre 11/22/2010, 12/12/2012, 02/15/2014  . Influenza,inj,Quad PF,36+ Mos 11/26/2012, 01/17/2015, 11/06/2015  . Influenza-Unspecified 02/26/2014  . PPD Test 07/01/2016  . Pneumococcal Polysaccharide-23 02/22/2008, 11/26/2012, 12/12/2012    Social History  Substance Use Topics  .  Smoking status: Light Tobacco Smoker    Packs/day: 0.25    Years: 42.00    Types: Cigarettes  . Smokeless tobacco: Never Used     Comment: last attempt to quit 04/12/16, down to 6 cigs daily   . Alcohol use No    Family history is   Family History  Problem Relation Age of Onset  . Heart Problems Mother        27  . Heart disease Father        85      Review of Systems  DATA OBTAINED: from patient GENERAL:  no fevers, fatigue, appetite changes SKIN: No itching, or rash EYES: No eye pain, redness, discharge EARS: No earache, tinnitus, change in hearing NOSE: No congestion, drainage or bleeding  MOUTH/THROAT: No mouth or tooth pain, No sore throat RESPIRATORY: No cough, wheezing, SOB CARDIAC: No chest pain, palpitations, lower extremity edema  GI: No abdominal pain, No N/V/D or constipation, No heartburn or reflux  GU: No dysuria, frequency or urgency, or incontinence  MUSCULOSKELETAL: always c/o  unrelieved bone/joint pain NEUROLOGIC: No headache, dizziness or focal weakness PSYCHIATRIC: No c/o anxiety or sadness   Vitals:   08/09/16  1026  BP: 101/72  Pulse: 74  Resp: 20  Temp: 97.6 F (36.4 C)    SpO2 Readings from Last 1 Encounters:  08/09/16 92%   Body mass index is 28.37 kg/m.     Physical Exam  GENERAL APPEARANCE: Alert, conversant,  No acute distress.  SKIN: No diaphoresis rash HEAD: Normocephalic, atraumatic  EYES: Conjunctiva/lids clear. Pupils round, reactive. EOMs intact.  EARS: External exam WNL, canals clear. Hearing grossly normal.  NOSE: No deformity or discharge.  MOUTH/THROAT: Lips w/o lesions  RESPIRATORY: Breathing is even, unlabored. Lung sounds are clear   CARDIOVASCULAR: Heart RRR no murmurs, rubs or gallops. No peripheral edema.   GASTROINTESTINAL: Abdomen is soft, non-tender, not distended w/ normal bowel sounds. GENITOURINARY: Bladder non tender, not distended  MUSCULOSKELETAL: No abnormal joints or musculature NEUROLOGIC:  Cranial nerves 2-12 grossly intact. Moves all extremities  PSYCHIATRIC: Mood and affect appropriate to situation, no behavioral issues  Patient Active Problem List   Diagnosis Date Noted  . Hip pain, acute, right 07/06/2016  . Arthritis 07/06/2016  . Chronic lower back pain 07/06/2016  . Hypothyroidism 07/06/2016  . Diabetes mellitus type 2 with complications ([place]) 35/00/9381  . Chronic pain disorder   . Diverticulitis large intestine   . Hyperlipidemia   . Myocardial infarction ([place])   . S/P angioplasty with stent 11/05/15 PCI DES to mLAD  11/06/2015  . Ischemic chest pain   . Status post cholecystectomy 09/25/2014  . Chest pain 09/24/2014  . COPD exacerbation ([place]) 09/24/2014  . Chest pain of uncertain etiology   . Hyperlipidemia LDL goal <70 12/05/2008  . Obesity 12/05/2008  . DEPRESSION, MAJOR 12/05/2008  . BIPOLAR DISORDER UNSPECIFIED 12/05/2008  . TOBACCO ABUSE 12/05/2008  . Hypertension 12/05/2008  . MYOCARDIAL INFARCTION, HX OF 12/05/2008  . Coronary atherosclerosis 12/05/2008  . COPD (chronic obstructive pulmonary disease) ([place])  12/05/2008  . PULMONARY NODULE, LEFT LOWER LOBE 12/05/2008  . GERD 12/05/2008  . EROSIVE GASTRITIS 12/05/2008  . DEEP VENOUS THROMBOPHLEBITIS, HX OF 12/05/2008      Labs reviewed: Basic Metabolic Panel:    Component Value Date/Time   NA 134 (A) 08/07/2016   K 3.9 08/07/2016   CL 100 (L) 11/09/2015 0523   CO2 22 11/09/2015 0523   GLUCOSE 117 (H) 11/09/2015 8299  BUN 18 08/07/2016   CREATININE 0.9 08/07/2016   CREATININE 0.73 11/09/2015 0523   CALCIUM 9.3 11/09/2015 0523   PROT 7.2 12/17/2014 2054   ALBUMIN 4.0 12/17/2014 2054   AST 13 (A) 08/04/2016   ALT 12 08/04/2016   ALKPHOS 84 08/04/2016   BILITOT 0.5 12/17/2014 2054   GFRNONAA >60 11/09/2015 0523   GFRAA >60 11/09/2015 0523     Recent Labs  11/05/15 0530  11/06/15 0420 11/09/15 0523  08/04/16 08/05/16 08/07/16  NA 135  --  135 134*  < > 139 137  137 134*  K 4.0  --  4.2 3.8  < > 3.3* 3.6  3.6 3.9  CL 101  --  105 100*  --   --   --   --   CO2 25  --  21* 22  --   --   --   --   GLUCOSE 100*  --  112* 117*  --   --   --   --   BUN 13  --  13 8  < > 5 9  9 18   CREATININE 0.73  < > 0.81 0.73  < > 0.6 0.8  0.8 0.9  CALCIUM 9.3  --  8.9 9.3  --   --   --   --   < > = values in this interval not displayed. Liver Function Tests:  Recent Labs  06/20/16 08/04/16  AST 12* 13*  ALT 9* 12  ALKPHOS 86 84   No results for input(s): LIPASE, AMYLASE in the last 8760 hours. No results for input(s): AMMONIA in the last 8760 hours. CBC:  Recent Labs  11/05/15 1713 11/06/15 0420 11/09/15 0523 06/21/16 08/04/16 08/05/16  WBC 17.2* 13.4* 10.9* 9.2 12.5 9.2  HGB 13.7 12.7* 14.3 13.4* 12.7* 11.4*  HCT 41.1 38.9* 42.9 39* 36* 34*  MCV 85.4 86.4 85.5  --   --   --   PLT 215 187 197 173 256 200   Lipid  Recent Labs  02/23/16  CHOL 133  HDL 34*  LDLCALC 67  TRIG 159    Cardiac Enzymes:  Recent Labs  11/05/15 0530 11/05/15 0830  TROPONINI <0.03 <0.03   BNP:  Recent Labs  11/05/15 0530  11/09/15 0523  BNP 6.0 7.3   No results found for: [facility] Lab Results  Component Value Date   HGBA1C  11/19/2008    5.2 (NOTE) The ADA recommends the following therapeutic goal for glycemic control related to Hgb A1c measurement: Goal of therapy: <6.5 Hgb A1c  Reference: American Diabetes Association: Clinical Practice Recommendations 2010, Diabetes Care, 2010, 33: (Suppl  1).   Lab Results  Component Value Date   TSH 3.21 02/23/2016   No results found for: VITAMINB12 No results found for: FOLATE No results found for: IRON, TIBC, FERRITIN  Imaging and Procedures obtained prior to SNF admission: Dg Chest 2 View  Result Date: 11/09/2015 CLINICAL DATA:  61 y/o  M; chest pain with shortness of breath. EXAM: CHEST  2 VIEW COMPARISON:  11/05/2015 chest radiograph. FINDINGS: Stable cardiomediastinal silhouette given differences in technique. Clear lungs. No pneumothorax. No pleural effusion. No acute osseous abnormality is evident. Mild multilevel degenerative changes of the spine. IMPRESSION: Clear lungs. Electronically Signed   By: Kristine Garbe M.D.   On: 11/09/2015 05:41     Not all labs, radiology exams or other studies done during hospitalization come through on my EPIC note; however they are reviewed by me.  Assessment and Plan  CHEST PAIN-resolved, troponins negative, chest pain did appear typical. Echo showed normal left ventricular function and size normal EF EKG did not show any significant changes patient has been cleared from a cardiology point of view SNF - admitted for Ot/PT; continue ASA 81 mg by mouth daily Zocor 20 mg daily and nitroglycerin 1/150 every 5 when necessary  COPD/SOB-admission chest x-ray showed vascular congestion and patient was given IV Lasix patient. Patient didn't have any crackles or lower extremity edema on examination SNF - plan to continue continue patient's usual inhalers albuterol MDI Advair 250/50 one puff twice a  day  DEBILITY/CHRONIC PAIN-history of chronic pain of low back and right hip; patient is being followed by a pain clinic SNF - plan to continue patient's pain clinic dose of narcotic which is oxycodone 10 mg 1 tablet every 4 hours and Neurontin 300 mg 3 times a day and Zanaflex 2 mg every 6 when necessary  HTN SNF - controlled; continue Norvasc 10 mg daily, lisinopril 5 mg by mouth daily  HLD SNF - stable; continue Zocor 20 mg by mouth daily  DM2 SNF - stable; continue Glucophage 500 mg by mouth twice a day   Time spent > 45 min;> 50% of time with patient was spent reviewing records, labs, tests and studies, counseling and developing plan of care  Webb Silversmith D. Sheppard Coil, MD

## 2016-08-12 LAB — CBC AND DIFFERENTIAL
HCT: 40 — AB (ref 41–53)
Hemoglobin: 13.1 — AB (ref 13.5–17.5)
PLATELETS: 206 (ref 150–399)
WBC: 6.7

## 2016-08-12 LAB — BASIC METABOLIC PANEL
BUN: 12 (ref 4–21)
CREATININE: 0.7 (ref 0.6–1.3)
Glucose: 76

## 2016-08-20 ENCOUNTER — Encounter: Payer: Self-pay | Admitting: Internal Medicine

## 2016-08-22 ENCOUNTER — Non-Acute Institutional Stay (SKILLED_NURSING_FACILITY): Payer: Medicare HMO | Admitting: Internal Medicine

## 2016-08-22 ENCOUNTER — Other Ambulatory Visit: Payer: Self-pay

## 2016-08-22 ENCOUNTER — Encounter: Payer: Self-pay | Admitting: Internal Medicine

## 2016-08-22 DIAGNOSIS — E118 Type 2 diabetes mellitus with unspecified complications: Secondary | ICD-10-CM

## 2016-08-22 DIAGNOSIS — R0602 Shortness of breath: Secondary | ICD-10-CM

## 2016-08-22 DIAGNOSIS — J449 Chronic obstructive pulmonary disease, unspecified: Secondary | ICD-10-CM | POA: Diagnosis not present

## 2016-08-22 DIAGNOSIS — I208 Other forms of angina pectoris: Secondary | ICD-10-CM | POA: Diagnosis not present

## 2016-08-22 DIAGNOSIS — I10 Essential (primary) hypertension: Secondary | ICD-10-CM | POA: Diagnosis not present

## 2016-08-22 DIAGNOSIS — E785 Hyperlipidemia, unspecified: Secondary | ICD-10-CM | POA: Diagnosis not present

## 2016-08-22 DIAGNOSIS — F319 Bipolar disorder, unspecified: Secondary | ICD-10-CM

## 2016-08-22 DIAGNOSIS — G894 Chronic pain syndrome: Secondary | ICD-10-CM

## 2016-08-22 HISTORY — DX: Shortness of breath: R06.02

## 2016-08-22 MED ORDER — TEMAZEPAM 30 MG PO CAPS: 30.0000 mg | ORAL_CAPSULE | Freq: Every day | ORAL | 0 refills | Status: DC

## 2016-08-22 NOTE — Telephone Encounter (Signed)
Prescription request was received from:    [facility] [address] [place] [address]  Phone: [phone] Fax: [phone] 

## 2016-08-22 NOTE — Progress Notes (Signed)
Location:  [place] Room Number: 100 Place of Service:  SNF [id number])  Provider: Noah Delaine. Sheppard Coil, MD   PCP: Beckie Salts, MD Patient Care Team: Beckie Salts, MD as PCP - General (Internal Medicine)  Extended Emergency Contact Information Primary Emergency Contact: [place] Mobile Phone: [phone] Relation: Daughter  Allergies  Allergen Reactions  . Rocephin [Ceftriaxone Sodium In Dextrose] Anaphylaxis  . Ampicillin   . Lipitor [Atorvastatin]   . Prochlorperazine Edisylate Other (See Comments)    Childhood Reaction.  . Toradol [Ketorolac Tromethamine] Nausea And Vomiting    Pt states he is not allergic to ibuprofen  . Tramadol Nausea And Vomiting    Chief Complaint  Patient presents with  . Discharge Note    discharge from SNF    HPI:  61 y.o. male  with History of coronary artery disease, slowly OPD, hypertension and diabetes who presented to [facility] emergency department for evaluation of chest pain and shortness of breath. Evaluation in the ED was consistent with typical chest pain and patient was admitted for to [facility] from 6/14-18 for evaluation. Workup was negative. Hospital course was complicated by his chronic issues of back pain and right hip pain for which patient goes to a pain clinic. Decided the patient should be admitted to a skilled nursing facility for generalized weakness. Pt is ready to be d/c to home.    Past Medical History:  Diagnosis Date  . Anginal pain ([place])   . Anxiety   . Arthritis    "right hip; herniated L4-5" (11/05/2015)  . Asthma   . Bipolar 1 disorder ([place])   . Chronic bronchitis ([place])   . Chronic lower back pain   . Chronic pain disorder   . COPD (chronic obstructive pulmonary disease) ([place])   . Coronary artery disease   . Diverticulitis large intestine   . DVT (deep venous thrombosis) ([place]) 1991   "left calf; after 1st knee  scope"  . Fungal esophagitis   . GERD (gastroesophageal reflux disease)   . Heart attack ([place]) 2010   BMS x 2 RCA  . High cholesterol   . Hyperlipidemia   . Hypertension   . Hypothyroidism   . MRSA (methicillin resistant staph aureus) culture positive   . Myocardial infarction ([place])   . Pneumonia    "several times"  . S/P angioplasty with stent 11/05/15 PCI DES to mLAD  11/06/2015  . Schizo affective schizophrenia ([place]) 2002    Past Surgical History:  Procedure Laterality Date  . APPENDECTOMY    . CARDIAC CATHETERIZATION N/A 11/05/2015   Procedure: Left Heart Cath and Coronary Angiography;  Surgeon: Nelva Bush, MD;  Location: [place] CV LAB;  Service: Cardiovascular;  Laterality: N/A;  . CARDIAC CATHETERIZATION N/A 11/05/2015   Procedure: Coronary Stent Intervention;  Surgeon: Nelva Bush, MD;  Location: [place] CV LAB;  Service: Cardiovascular;  Laterality: N/A;  . CARDIAC CATHETERIZATION N/A 11/05/2015   Procedure: Intravascular Pressure Wire/FFR Study;  Surgeon: Nelva Bush, MD;  Location: [place] CV LAB;  Service: Cardiovascular;  Laterality: N/A;  . CORONARY ANGIOPLASTY WITH STENT PLACEMENT  2010   90% RCA s/p BMS x 2, 50% LAD, 30% CFX, EF 45-50%  . CORONARY ANGIOPLASTY WITH STENT PLACEMENT  11/05/2015   "1 today; makes a total of 4 stents" (11/05/2015)  . INGUINAL HERNIA REPAIR Right   . KNEE ARTHROSCOPY Left X 3  . LAPAROSCOPIC CHOLECYSTECTOMY    .  TONSILLECTOMY AND ADENOIDECTOMY    . UPPER GI ENDOSCOPY  05/24/2016   UGI ENDO INCLUDE ESOPHAGUS, STOMACH & DUODENUM &/OR JEJUNUM; DX W/WO COLLECTION SPECIMEN BY [facility] OR [place]; SURGEON :ALBERT [place] Morelle, MD LOCATION; [place]     reports that he has been smoking Cigarettes.  He has a 10.50 pack-year smoking history. He has never used smokeless tobacco. He reports that he does not drink alcohol or use drugs. Social History   Social History  . Marital status: Divorced    Spouse name: N/A  .  Number of children: N/A  . Years of education: N/A   Occupational History  . Disabled    Social History Main Topics  . Smoking status: Light Tobacco Smoker    Packs/day: 0.25    Years: 42.00    Types: Cigarettes  . Smokeless tobacco: Never Used     Comment: last attempt to quit 04/12/16, down to 6 cigs daily   . Alcohol use No  . Drug use: No  . Sexual activity: Yes   Other Topics Concern  . Not on file   Social History Narrative   Lives alone in [place].    Pertinent  Health Maintenance Due  Topic Date Due  . FOOT EXAM  07/12/1964  . OPHTHALMOLOGY EXAM  07/12/1964  . COLONOSCOPY  07/12/2004  . HEMOGLOBIN A1C  05/19/2009  . INFLUENZA VACCINE  09/21/2016    Medications: Allergies as of 08/22/2016      Reactions   Rocephin [ceftriaxone Sodium In Dextrose] Anaphylaxis   Ampicillin    Lipitor [atorvastatin]    Prochlorperazine Edisylate Other (See Comments)   Childhood Reaction.   Toradol [ketorolac Tromethamine] Nausea And Vomiting   Pt states he is not allergic to ibuprofen   Tramadol Nausea And Vomiting      Medication List       Accurate as of 08/22/16  2:52 PM. Always use your most recent med list.          albuterol 108 (90 Base) MCG/ACT inhaler Commonly known as:  PROVENTIL HFA;VENTOLIN HFA Inhale 2 puffs into the lungs every 6 (six) hours as needed for wheezing.   ALPRAZolam 0.25 MG tablet Commonly known as:  XANAX Take 0.25 mg by mouth. Take one tablet three times daily   amLODipine 10 MG tablet Commonly known as:  NORVASC Take 10 mg by mouth daily.   aspirin EC 81 MG tablet Take 81 mg by mouth daily.   Fluticasone-Salmeterol 250-50 MCG/DOSE Aepb Commonly known as:  ADVAIR Inhale 1 puff into the lungs 2 (two) times daily.   gabapentin 300 MG capsule Commonly known as:  NEURONTIN Take 300 mg by mouth 3 (three) times daily.   levothyroxine 25 MCG tablet Commonly known as:  SYNTHROID, LEVOTHROID Take 25 mcg by mouth daily at 6 (six) AM.    lisinopril 5 MG tablet Commonly known as:  PRINIVIL,ZESTRIL Take 5 mg by mouth daily.   metFORMIN 500 MG tablet Commonly known as:  GLUCOPHAGE Take 500 mg by mouth 2 (two) times daily with a meal.   MULTI-VITAMINS Tabs Take 1 tablet by mouth daily.   nitroGLYCERIN 0.4 MG SL tablet Commonly known as:  NITROSTAT Place 0.4 mg under the tongue every 5 (five) minutes as needed for chest pain.   omeprazole 20 MG capsule Commonly known as:  PRILOSEC Take 20 mg by mouth 2 (two) times daily.   Oxycodone HCl 10 MG Tabs Take 10 mg by mouth every 6 (six) hours.  polyethylene glycol packet Commonly known as:  MIRALAX / GLYCOLAX Take 17 g by mouth daily as needed for mild constipation.   promethazine 25 MG tablet Commonly known as:  PHENERGAN Take 1 tablet (25 mg total) by mouth every 6 (six) hours as needed for nausea or vomiting.   QUEtiapine 50 MG tablet Commonly known as:  SEROQUEL Take 50 mg by mouth 4 (four) times daily.   simvastatin 20 MG tablet Commonly known as:  ZOCOR Take 20 mg by mouth at bedtime.   temazepam 30 MG capsule Commonly known as:  RESTORIL Take 30 mg by mouth at bedtime.   tiZANidine 4 MG tablet Commonly known as:  ZANAFLEX Take 2 mg by mouth every 6 (six) hours as needed.   venlafaxine 75 MG tablet Commonly known as:  EFFEXOR Take 75 mg by mouth daily.        Vitals:   08/22/16 1317  BP: (!) 118/57  Pulse: 74  Resp: 18  Temp: 97.2 F (36.2 C)  SpO2: 98%  Weight: 249 lb 9.6 oz (113.2 kg)  Height: 6' (1.829 m)   Body mass index is 33.85 kg/m.  Physical Exam  GENERAL APPEARANCE: Alert, conversant. No acute distress.  HEENT: Unremarkable. RESPIRATORY: Breathing is even, unlabored. Lung sounds are clear   CARDIOVASCULAR: Heart RRR no murmurs, rubs or gallops. No peripheral edema.  GASTROINTESTINAL: Abdomen is soft, non-tender, not distended w/ normal bowel sounds.  NEUROLOGIC: Cranial nerves 2-12 grossly intact. Moves all  extremities   Labs reviewed: Basic Metabolic Panel:  Recent Labs  11/05/15 0530  11/06/15 0420 11/09/15 0523  08/04/16 08/05/16 08/07/16 08/12/16  NA 135  --  135 134*  < > 139 137  137 134*  --   K 4.0  --  4.2 3.8  < > 3.3* 3.6  3.6 3.9  --   CL 101  --  105 100*  --   --   --   --   --   CO2 25  --  21* 22  --   --   --   --   --   GLUCOSE 100*  --  112* 117*  --   --   --   --   --   BUN 13  --  13 8  < > 5 9  9 18 12   CREATININE 0.73  < > 0.81 0.73  < > 0.6 0.8  0.8 0.9 0.7  CALCIUM 9.3  --  8.9 9.3  --   --   --   --   --   < > = values in this interval not displayed. No results found for: [facility] Liver Function Tests:  Recent Labs  06/20/16 08/04/16  AST 12* 13*  ALT 9* 12  ALKPHOS 86 84   No results for input(s): LIPASE, AMYLASE in the last 8760 hours. No results for input(s): AMMONIA in the last 8760 hours. CBC:  Recent Labs  11/05/15 1713 11/06/15 0420 11/09/15 0523  08/04/16 08/05/16 08/12/16  WBC 17.2* 13.4* 10.9*  < > 12.5 9.2 6.7  HGB 13.7 12.7* 14.3  < > 12.7* 11.4* 13.1*  HCT 41.1 38.9* 42.9  < > 36* 34* 40*  MCV 85.4 86.4 85.5  --   --   --   --   PLT 215 187 197  < > 256 200 206  < > = values in this interval not displayed. Lipid  Recent Labs  02/23/16  CHOL 133  HDL 34*  LDLCALC 67  TRIG 159   Cardiac Enzymes:  Recent Labs  11/05/15 0530 11/05/15 0830  TROPONINI <0.03 <0.03   BNP:  Recent Labs  11/05/15 0530 11/09/15 0523  BNP 6.0 7.3   CBG: No results for input(s): GLUCAP in the last 8760 hours.  Procedures and Imaging Studies During Stay: No results found.  Assessment/Plan:   Stable angina pectoris (HCC)  Chronic obstructive pulmonary disease, unspecified COPD type (HCC)  SOB (shortness of breath)  Hyperlipidemia, unspecified hyperlipidemia type  Chronic pain disorder  Type 2 diabetes mellitus with complication, without long-term current use of insulin ([place])  Essential hypertension  Bipolar 1  disorder ([place])   Patient is being discharged with the following home health services:  Ot/PT/Nursing  Patient is being discharged with the following durable medical equipment:  none  Patient has been advised to f/u with their PCP in 1-2 weeks to bring them up to date on their rehab stay.  Social services at facility was responsible for arranging this appointment.  Pt was provided with a 30 day supply of prescriptions for medications and refills must be obtained from their PCP.  For controlled substances, a more limited supply may be provided adequate until PCP appointment only.  Medications have been reconciled.    Time spent . 30 min;> 50% of time with patient was spent reviewing records, labs, tests and studies, counseling and developing plan of care  Noah Delaine. Sheppard Coil, MD

## 2016-09-20 ENCOUNTER — Other Ambulatory Visit: Payer: Self-pay | Admitting: Internal Medicine

## 2016-10-04 ENCOUNTER — Other Ambulatory Visit: Payer: Self-pay | Admitting: Internal Medicine

## 2016-10-09 ENCOUNTER — Other Ambulatory Visit: Payer: Self-pay | Admitting: Internal Medicine

## 2016-10-10 ENCOUNTER — Emergency Department (HOSPITAL_BASED_OUTPATIENT_CLINIC_OR_DEPARTMENT_OTHER)
Admission: EM | Admit: 2016-10-10 | Discharge: 2016-10-10 | Disposition: A | Discharge status: Home / Self Care | Payer: Medicare HMO | Attending: Emergency Medicine | Admitting: Emergency Medicine

## 2016-10-10 ENCOUNTER — Emergency Department (HOSPITAL_BASED_OUTPATIENT_CLINIC_OR_DEPARTMENT_OTHER): Payer: Medicare HMO

## 2016-10-10 ENCOUNTER — Encounter (HOSPITAL_BASED_OUTPATIENT_CLINIC_OR_DEPARTMENT_OTHER): Payer: Self-pay

## 2016-10-10 DIAGNOSIS — E119 Type 2 diabetes mellitus without complications: Secondary | ICD-10-CM | POA: Diagnosis not present

## 2016-10-10 DIAGNOSIS — Z955 Presence of coronary angioplasty implant and graft: Secondary | ICD-10-CM | POA: Diagnosis not present

## 2016-10-10 DIAGNOSIS — J45909 Unspecified asthma, uncomplicated: Secondary | ICD-10-CM | POA: Diagnosis not present

## 2016-10-10 DIAGNOSIS — Z7982 Long term (current) use of aspirin: Secondary | ICD-10-CM | POA: Insufficient documentation

## 2016-10-10 DIAGNOSIS — Z951 Presence of aortocoronary bypass graft: Secondary | ICD-10-CM | POA: Insufficient documentation

## 2016-10-10 DIAGNOSIS — R1032 Left lower quadrant pain: Secondary | ICD-10-CM

## 2016-10-10 DIAGNOSIS — I252 Old myocardial infarction: Secondary | ICD-10-CM | POA: Insufficient documentation

## 2016-10-10 DIAGNOSIS — R197 Diarrhea, unspecified: Secondary | ICD-10-CM | POA: Diagnosis present

## 2016-10-10 DIAGNOSIS — J449 Chronic obstructive pulmonary disease, unspecified: Secondary | ICD-10-CM | POA: Insufficient documentation

## 2016-10-10 DIAGNOSIS — I110 Hypertensive heart disease with heart failure: Secondary | ICD-10-CM | POA: Insufficient documentation

## 2016-10-10 DIAGNOSIS — E039 Hypothyroidism, unspecified: Secondary | ICD-10-CM | POA: Insufficient documentation

## 2016-10-10 DIAGNOSIS — I2510 Atherosclerotic heart disease of native coronary artery without angina pectoris: Secondary | ICD-10-CM | POA: Diagnosis not present

## 2016-10-10 DIAGNOSIS — Z7984 Long term (current) use of oral hypoglycemic drugs: Secondary | ICD-10-CM | POA: Insufficient documentation

## 2016-10-10 DIAGNOSIS — F17210 Nicotine dependence, cigarettes, uncomplicated: Secondary | ICD-10-CM | POA: Insufficient documentation

## 2016-10-10 DIAGNOSIS — Z79899 Other long term (current) drug therapy: Secondary | ICD-10-CM | POA: Insufficient documentation

## 2016-10-10 DIAGNOSIS — R112 Nausea with vomiting, unspecified: Secondary | ICD-10-CM

## 2016-10-10 DIAGNOSIS — I509 Heart failure, unspecified: Secondary | ICD-10-CM | POA: Diagnosis not present

## 2016-10-10 HISTORY — DX: Heart failure, unspecified: I50.9

## 2016-10-10 LAB — LIPASE, BLOOD: LIPASE: 18 U/L (ref 11–51)

## 2016-10-10 LAB — COMPREHENSIVE METABOLIC PANEL
ALBUMIN: 4.3 g/dL (ref 3.5–5.0)
ALT: 15 U/L — ABNORMAL LOW (ref 17–63)
AST: 21 U/L (ref 15–41)
Alkaline Phosphatase: 92 U/L (ref 38–126)
Anion gap: 12 (ref 5–15)
BUN: 9 mg/dL (ref 6–20)
CHLORIDE: 98 mmol/L — AB (ref 101–111)
CO2: 25 mmol/L (ref 22–32)
Calcium: 9.3 mg/dL (ref 8.9–10.3)
Creatinine, Ser: 0.72 mg/dL (ref 0.61–1.24)
GFR calc Af Amer: >60 mL/min (ref >60)
Glucose, Bld: 101 mg/dL — ABNORMAL HIGH (ref 65–99)
POTASSIUM: 4.2 mmol/L (ref 3.5–5.1)
SODIUM: 135 mmol/L (ref 135–145)
Total Bilirubin: 0.5 mg/dL (ref 0.3–1.2)
Total Protein: 7.2 g/dL (ref 6.5–8.1)

## 2016-10-10 LAB — CBC WITH DIFFERENTIAL/PLATELET
BASOS ABS: 0 10*3/uL (ref 0.0–0.1)
BASOS PCT: 0 %
EOS ABS: 0.1 10*3/uL (ref 0.0–0.7)
EOS PCT: 1 %
HCT: 37.9 % — ABNORMAL LOW (ref 39.0–52.0)
Hemoglobin: 13.3 g/dL (ref 13.0–17.0)
Lymphocytes Relative: 20 %
Lymphs Abs: 2 10*3/uL (ref 0.7–4.0)
MCH: 29.6 pg (ref 26.0–34.0)
MCHC: 35.1 g/dL (ref 30.0–36.0)
MCV: 84.4 fL (ref 78.0–100.0)
MONO ABS: 0.5 10*3/uL (ref 0.1–1.0)
Monocytes Relative: 5 %
Neutro Abs: 7.4 10*3/uL (ref 1.7–7.7)
Neutrophils Relative %: 74 %
PLATELETS: 228 10*3/uL (ref 150–400)
RBC: 4.49 MIL/uL (ref 4.22–5.81)
RDW: 13.5 % (ref 11.5–15.5)
WBC: 10 10*3/uL (ref 4.0–10.5)

## 2016-10-10 LAB — URINALYSIS, ROUTINE W REFLEX MICROSCOPIC
Bilirubin Urine: NEGATIVE
Glucose, UA: NEGATIVE mg/dL
KETONES UR: NEGATIVE mg/dL
LEUKOCYTES UA: NEGATIVE
Nitrite: NEGATIVE
PH: 6 (ref 5.0–8.0)
PROTEIN: NEGATIVE mg/dL
Specific Gravity, Urine: 1.003 — ABNORMAL LOW (ref 1.005–1.030)

## 2016-10-10 LAB — URINALYSIS, MICROSCOPIC (REFLEX)

## 2016-10-10 LAB — CK: Total CK: 53 U/L (ref 49–397)

## 2016-10-10 MED ORDER — ONDANSETRON HCL 4 MG/2ML IJ SOLN
4.0000 mg | Freq: Once | INTRAMUSCULAR | Status: AC
  Administered 2016-10-10: 4 mg via INTRAVENOUS
  Filled 2016-10-10: qty 2

## 2016-10-10 MED ORDER — ONDANSETRON HCL 4 MG/2ML IJ SOLN
4.0000 mg | Freq: Once | INTRAMUSCULAR | Status: AC
  Administered 2016-10-10: 4 mg via INTRAVENOUS

## 2016-10-10 MED ORDER — IOPAMIDOL (ISOVUE-300) INJECTION 61%
100.0000 mL | Freq: Once | INTRAVENOUS | Status: AC | PRN
Start: 2016-10-10 — End: 2016-10-10
  Administered 2016-10-10: 100 mL via INTRAVENOUS

## 2016-10-10 MED ORDER — FENTANYL CITRATE (PF) 100 MCG/2ML IJ SOLN
50.0000 ug | Freq: Once | INTRAMUSCULAR | Status: AC
  Administered 2016-10-10: 50 ug via INTRAVENOUS
  Filled 2016-10-10: qty 2

## 2016-10-10 MED ORDER — ONDANSETRON HCL 4 MG/2ML IJ SOLN
INTRAMUSCULAR | Status: AC
  Administered 2016-10-10: 4 mg via INTRAVENOUS
  Filled 2016-10-10: qty 2

## 2016-10-10 MED ORDER — SODIUM CHLORIDE 0.9 % IV BOLUS (SEPSIS)
1000.0000 mL | Freq: Once | INTRAVENOUS | Status: AC
  Administered 2016-10-10: 1000 mL via INTRAVENOUS

## 2016-10-10 NOTE — ED Notes (Signed)
Pt given ginger ale.

## 2016-10-10 NOTE — ED Notes (Signed)
Patient transported to CT 

## 2016-10-10 NOTE — ED Provider Notes (Signed)
[facility] DEPT [facility] Provider Note   CSN: [id number] Arrival date & time: 10/10/16  1041     History   Chief Complaint Chief Complaint  Patient presents with  . Diarrhea    HPI [name] is a 61 y.o. male with past medical history significant for diverticulitis presenting with 4 days of left lower quadrant pain, nausea, vomiting, watery nonbloody strongly malodorous diarrhea. He was brought in by friends because he felt really weak today and is hurting all over. He explains that he had a similar episode a long time ago when he was diagnosed with rhabdo. He reports a fever of 101.6 earlier today. No antipyretics today. He has not tried anything for the symptoms as he is not able to keep anything down. Patient denies any urinary symptoms. Patient denies any ill contacts or questionable foods prior to onset of symptoms. Patient has a history of COPD and chronic cough at baseline but reports that has been worsening over the last couple of days.  No recent antibiotic use or change in medications Patient's surgical history includes cholecystectomy, appendectomy, right inguinal hernia repair.  HPI  Past Medical History:  Diagnosis Date  . Anginal pain ([place])   . Anxiety   . Arthritis    "right hip; herniated L4-5" (11/05/2015)  . Asthma   . Bipolar 1 disorder ([place])   . CHF (congestive heart failure) ([place])   . Chronic bronchitis ([place])   . Chronic lower back pain   . Chronic pain disorder   . COPD (chronic obstructive pulmonary disease) ([place])   . Coronary artery disease   . Diverticulitis large intestine   . DVT (deep venous thrombosis) ([place]) 1991   "left calf; after 1st knee scope"  . Fungal esophagitis   . GERD (gastroesophageal reflux disease)   . Heart attack ([place]) 2010   BMS x 2 RCA  . High cholesterol   . Hyperlipidemia   . Hypertension   . Hypothyroidism   . MRSA (methicillin resistant staph aureus) culture positive   . Myocardial infarction ([place])   . Pneumonia     "several times"  . S/P angioplasty with stent 11/05/15 PCI DES to mLAD  11/06/2015  . Schizo affective schizophrenia [facility]) 2002    Patient Active Problem List   Diagnosis Date Noted  . SOB (shortness of breath) 08/22/2016  . Hip pain, acute, right 07/06/2016  . Arthritis 07/06/2016  . Chronic lower back pain 07/06/2016  . Hypothyroidism 07/06/2016  . Diabetes mellitus type 2 with complications ([place]) 03/47/4259  . Chronic pain disorder   . Diverticulitis large intestine   . Hyperlipidemia   . Myocardial infarction ([place])   . S/P angioplasty with stent 11/05/15 PCI DES to mLAD  11/06/2015  . Ischemic chest pain   . Status post cholecystectomy 09/25/2014  . Chest pain 09/24/2014  . COPD exacerbation ([place]) 09/24/2014  . Chest pain of uncertain etiology   . Hyperlipidemia LDL goal <70 12/05/2008  . Obesity 12/05/2008  . DEPRESSION, MAJOR 12/05/2008  . Bipolar 1 disorder ([place]) 12/05/2008  . TOBACCO ABUSE 12/05/2008  . Hypertension 12/05/2008  . MYOCARDIAL INFARCTION, HX OF 12/05/2008  . Coronary atherosclerosis 12/05/2008  . COPD (chronic obstructive pulmonary disease) ([place]) 12/05/2008  . PULMONARY NODULE, LEFT LOWER LOBE 12/05/2008  . GERD 12/05/2008  . EROSIVE GASTRITIS 12/05/2008  . DEEP VENOUS THROMBOPHLEBITIS, HX OF 12/05/2008    Past Surgical History:  Procedure Laterality Date  . APPENDECTOMY    . CARDIAC CATHETERIZATION N/A 11/05/2015  Procedure: Left Heart Cath and Coronary Angiography;  Surgeon: Nelva Bush, MD;  Location: [facility] CV [facility];  Service: Cardiovascular;  Laterality: N/A;  . CARDIAC CATHETERIZATION N/A 11/05/2015   Procedure: Coronary Stent Intervention;  Surgeon: Nelva Bush, MD;  Location: [facility];  Service: Cardiovascular;  Laterality: N/A;  . CARDIAC CATHETERIZATION N/A 11/05/2015   Procedure: Intravascular Pressure Wire/FFR Study;  Surgeon: Nelva Bush, MD;  Location: [facility];  Service: Cardiovascular;  Laterality:  N/A;  . CORONARY ANGIOPLASTY WITH STENT PLACEMENT  2010   90% RCA s/p BMS x 2, 50% LAD, 30% CFX, EF 45-50%  . CORONARY ANGIOPLASTY WITH STENT PLACEMENT  11/05/2015   "1 today; makes a total of 4 stents" (11/05/2015)  . INGUINAL HERNIA REPAIR Right   . KNEE ARTHROSCOPY Left X 3  . LAPAROSCOPIC CHOLECYSTECTOMY    . TONSILLECTOMY AND ADENOIDECTOMY    . UPPER GI ENDOSCOPY  05/24/2016   UGI ENDO INCLUDE ESOPHAGUS, STOMACH & DUODENUM &/OR JEJUNUM; DX W/WO COLLECTION SPECIMEN BY [facility] OR [place]; SURGEON :ALBERT San Morelle, MD LOCATION; [facility]    Prior to Admission medications   Medication Sig Start Date End Date Taking? Authorizing Provider  albuterol (PROVENTIL HFA;VENTOLIN HFA) 108 (90 Base) MCG/ACT inhaler Inhale 2 puffs into the lungs every 6 (six) hours as needed for wheezing.    [provider]  ALPRAZolam Duanne Moron) 0.25 MG tablet Take 0.25 mg by mouth. Take one tablet three times daily    [provider]  amLODipine (NORVASC) 10 MG tablet Take 10 mg by mouth daily.     [provider]  aspirin EC 81 MG tablet Take 81 mg by mouth daily.     [provider]  Fluticasone-Salmeterol (ADVAIR) 250-50 MCG/DOSE AEPB Inhale 1 puff into the lungs 2 (two) times daily.     [provider]  gabapentin (NEURONTIN) 300 MG capsule Take 300 mg by mouth 3 (three) times daily.    [provider]  levothyroxine (SYNTHROID, LEVOTHROID) 25 MCG tablet Take 25 mcg by mouth daily at 6 (six) AM.     [provider]  lisinopril (PRINIVIL,ZESTRIL) 5 MG tablet Take 5 mg by mouth daily.     [provider]  metFORMIN (GLUCOPHAGE) 500 MG tablet Take 500 mg by mouth 2 (two) times daily with a meal.     [provider]  Multiple Vitamin (MULTI-VITAMINS) TABS Take 1 tablet by mouth daily.    [provider]  nitroGLYCERIN (NITROSTAT) 0.4 MG SL tablet Place 0.4 mg under the tongue every 5 (five)  minutes as needed for chest pain.     [provider]  omeprazole (PRILOSEC) 20 MG capsule Take 20 mg by mouth 2 (two) times daily.     [provider]  Oxycodone HCl 10 MG TABS Take 10 mg by mouth every 6 (six) hours.     [provider]  polyethylene glycol (MIRALAX / GLYCOLAX) packet Take 17 g by mouth daily as needed for mild constipation.     [provider]  promethazine (PHENERGAN) 25 MG tablet Take 1 tablet (25 mg total) by mouth every 6 (six) hours as needed for nausea or vomiting. 10/23/14   Horton, Barbette Hair, MD  QUEtiapine (SEROQUEL) 50 MG tablet Take 50 mg by mouth 4 (four) times daily.     [provider]  simvastatin (ZOCOR) 20 MG tablet Take 20 mg by mouth at bedtime.  [provider]  temazepam (RESTORIL) 30 MG capsule Take 1 capsule (30 mg total) by mouth at bedtime. 08/22/16   Lauree Chandler, NP  tiZANidine (ZANAFLEX) 4 MG tablet Take 2 mg by mouth every 6 (six) hours as needed.     [provider]  venlafaxine (EFFEXOR) 75 MG tablet Take 75 mg by mouth daily.     [provider]    Family History Family History  Problem Relation Age of Onset  . Heart Problems Mother        63  . Heart disease Father        30    Social History Social History  Substance Use Topics  . Smoking status: Light Tobacco Smoker    Packs/day: 0.25    Years: 42.00    Types: Cigarettes  . Smokeless tobacco: Never Used     Comment: last attempt to quit 04/12/16, down to 6 cigs daily   . Alcohol use No     Allergies   Rocephin [ceftriaxone sodium in dextrose]; Ampicillin; Lipitor [atorvastatin]; Prochlorperazine edisylate; Toradol [ketorolac tromethamine]; and Tramadol   Review of Systems Review of Systems  Constitutional: Positive for fatigue. Negative for chills and fever.  Respiratory: Positive for cough. Negative for choking, chest tightness, shortness of breath, wheezing and stridor.   Cardiovascular:  Negative for chest pain, palpitations and leg swelling.  Gastrointestinal: Positive for abdominal pain, diarrhea, nausea and vomiting. Negative for abdominal distention and blood in stool.  Genitourinary: Negative for difficulty urinating, dysuria, flank pain, frequency and hematuria.  Musculoskeletal: Positive for myalgias. Negative for arthralgias and back pain.  Skin: Negative for color change, pallor, rash and wound.  Neurological: Positive for weakness. Negative for dizziness, tremors, seizures, syncope, facial asymmetry, speech difficulty, light-headedness, numbness and headaches.     Physical Exam Updated Vital Signs BP 130/83 (BP Location: Left Arm)   Pulse 88   Temp 99.2 F (37.3 C) (Oral)   Resp 20   Ht 6' (1.829 m)   Wt 111.1 kg (245 lb)   SpO2 99%   BMI 33.23 kg/m   Physical Exam  Constitutional: He appears well-developed and well-nourished. No distress.  Afebrile today without use of antipyretics, nontoxic appearing lying in bed in discomfort holding his abdomen. No acute distress  HENT:  Head: Normocephalic and atraumatic.  Eyes: Conjunctivae and EOM are normal. No scleral icterus.  Neck: Normal range of motion.  Cardiovascular: Normal rate, regular rhythm and normal heart sounds.   No murmur heard. Pulmonary/Chest: Effort normal and breath sounds normal. No respiratory distress. He has no wheezes. He has no rales. He exhibits no tenderness.  Abdominal: Soft. He exhibits no distension and no mass. There is tenderness. There is no rebound and no guarding.  Active bowel sounds. Periumbilical and LLq ttp. no rebound tenderness. No palpated masses.No costovertebral angle tenderness.  Musculoskeletal: He exhibits no edema.  Neurological: He is alert.  Skin: Skin is warm. No rash noted. He is not diaphoretic. No erythema.  Psychiatric: He has a normal mood and affect.  Nursing note and vitals reviewed.    ED Treatments / Results  Labs (all labs ordered are listed,  but only abnormal results are displayed) Labs Reviewed  CBC WITH DIFFERENTIAL/PLATELET - Abnormal; Notable for the following:       Result Value   HCT 37.9 (*)    All other components within normal limits  COMPREHENSIVE METABOLIC PANEL - Abnormal; Notable for the following:    Chloride 98 (*)  Glucose, Bld 101 (*)    ALT 15 (*)    All other components within normal limits  URINALYSIS, ROUTINE W REFLEX MICROSCOPIC - Abnormal; Notable for the following:    Specific Gravity, Urine 1.003 (*)    Hgb urine dipstick SMALL (*)    All other components within normal limits  URINALYSIS, MICROSCOPIC (REFLEX) - Abnormal; Notable for the following:    Bacteria, UA RARE (*)    Squamous Epithelial / LPF 0-5 (*)    All other components within normal limits  LIPASE, BLOOD  CK    EKG  EKG Interpretation None       Radiology Dg Chest 2 View  Result Date: 10/10/2016 CLINICAL DATA:  Patient with cough and abdominal pain. EXAM: CHEST  2 VIEW COMPARISON:  Chest radiograph 09/04/2016. FINDINGS: Normal cardiac and mediastinal contours. No consolidative pulmonary opacities. No pleural effusion or pneumothorax. Thoracic spine degenerative changes. IMPRESSION: No acute cardiopulmonary process. Probable basilar atelectasis or scarring. Electronically Signed   By: Lovey Newcomer M.D.   On: 10/10/2016 11:48   Ct Abdomen Pelvis W Contrast  Result Date: 10/10/2016 CLINICAL DATA:  All over abdominal pain and tenderness since Thursday. Prior appendectomy. EXAM: CT ABDOMEN AND PELVIS WITH CONTRAST TECHNIQUE: Multidetector CT imaging of the abdomen and pelvis was performed using the standard protocol following bolus administration of intravenous contrast. CONTRAST:  175mL ISOVUE-300 IOPAMIDOL (ISOVUE-300) INJECTION 61% COMPARISON:  09/04/2016. FINDINGS: Lower chest: 7 mm left lower lobe pulmonary nodule is unchanged comparing back to a scan from 11/03/2013, consistent with benign etiology. Hepatobiliary: The liver  shows diffusely decreased attenuation suggesting steatosis. Gallbladder surgically absent. No intrahepatic or extrahepatic biliary dilation. Pancreas: No focal mass lesion. No dilatation of the main duct. No intraparenchymal cyst. No peripancreatic edema. Spleen: No splenomegaly. No focal mass lesion. Adrenals/Urinary Tract: No adrenal nodule or mass. 7 mm low-density lesion interpolar left kidney similar to 715 2018 a second 6 mm low-density lesion interpolar left kidney is also stable. Right kidney unremarkable. No evidence for hydroureter. Bladder is moderately distended. Stomach/Bowel: Stomach is nondistended. No gastric wall thickening. No evidence of outlet obstruction. Duodenum is normally positioned as is the ligament of Treitz. No small bowel wall thickening. No small bowel dilatation. The terminal ileum is normal. Nonvisualization of the appendix is consistent with the reported history of appendectomy. Diverticuli are seen scattered along the entire length of the colon without CT findings of diverticulitis. Vascular/Lymphatic: There is abdominal aortic atherosclerosis without aneurysm. There is no gastrohepatic or hepatoduodenal ligament lymphadenopathy. No intraperitoneal or retroperitoneal lymphadenopathy. No pelvic sidewall lymphadenopathy. Reproductive: The prostate gland and seminal vesicles have normal imaging features. Other: No intraperitoneal free fluid. Musculoskeletal: Degenerative changes noted in the right acetabulum. Bone windows reveal no worrisome lytic or sclerotic osseous lesions. Degenerative changes noted L4-5. IMPRESSION: 1. No acute findings in the abdomen or pelvis. Specifically, no findings to explain the patient's history of pain and tenderness. 2. Stable 7 mm left lower lobe pulmonary nodule comparing back to 11/03/2013. This is compatible with benign etiology. 3.  Aortic Atherosclerois (ICD10-170.0) 4. Hepatic steatosis. 5. Colonic diverticulosis without diverticulitis.  Electronically Signed   By: Misty Stanley M.D.   On: 10/10/2016 13:41    Procedures Procedures (including critical care time)  Medications Ordered in ED Medications  ondansetron (ZOFRAN) injection 4 mg (4 mg Intravenous Given 10/10/16 1143)  fentaNYL (SUBLIMAZE) injection 50 mcg (50 mcg Intravenous Given 10/10/16 1143)  sodium chloride 0.9 % bolus 1,000 mL (0 mLs Intravenous Stopped 10/10/16  1251)  iopamidol (ISOVUE-300) 61 % injection 100 mL (100 mLs Intravenous Contrast Given 10/10/16 1323)  ondansetron (ZOFRAN) injection 4 mg (4 mg Intravenous Given 10/10/16 1546)     Initial Impression / Assessment and Plan / ED Course  I have reviewed the triage vital signs and the nursing notes.  Pertinent labs & imaging results that were available during my care of the patient were reviewed by me and considered in my medical decision making (see chart for details).    Patient with history of diverticulitis presenting with left lower quadrant pain, nausea, vomiting, diarrhea for 4 days, now with generalized fatigue and myalgias.  Afebrile, nontoxic-appearing. Labs unremarkable. CT abdomen pelvis: Negative for acute intra-abdominal pathology  Patient's symptoms were managed while in the emergency department. Successful PO challenge Possibly viral gastroenteritis.  Discussed results with patient and plan to prescribe Zofran for nausea. Advised to slowly reintroduce foods.  Patient reported that his insurance does not cover for Zofran and he is typically prescribed Phenergan only. Provided patient with resources for good Rx and low prices from in house pharmacy.  Patient asked for ginger ale and was without any episodes of emesis while in the emergency department.  All waiting for his by mouth challenge, patient decided to leave without his discharge papers.  Plan and return precautions have been discussed prior to patient leaving. Final Clinical Impressions(s) / ED Diagnoses   Final  diagnoses:  Nausea vomiting and diarrhea  LLQ pain    New Prescriptions New Prescriptions   No medications on file     Dossie Der 10/10/16 1630    Malvin Johns, MD 10/11/16 [phone]

## 2016-10-10 NOTE — ED Notes (Signed)
Pt requesting medication for nausea

## 2016-10-10 NOTE — ED Triage Notes (Signed)
C/o n/v/d x 4 days

## 2016-10-15 ENCOUNTER — Other Ambulatory Visit: Payer: Self-pay | Admitting: Internal Medicine

## 2016-10-17 LAB — CBC AND DIFFERENTIAL
HEMATOCRIT: 40 — AB (ref 41–53)
Hemoglobin: 14 (ref 13.5–17.5)
Platelets: 210 (ref 150–399)
WBC: 7.6

## 2016-10-20 LAB — BASIC METABOLIC PANEL
BUN: 16 (ref 4–21)
CREATININE: 0.8 (ref 0.6–1.3)
POTASSIUM: 4.8 (ref 3.4–5.3)
Sodium: 138 (ref 137–147)

## 2016-10-21 ENCOUNTER — Non-Acute Institutional Stay (SKILLED_NURSING_FACILITY): Payer: Medicare HMO | Admitting: Internal Medicine

## 2016-10-21 ENCOUNTER — Encounter: Payer: Self-pay | Admitting: Internal Medicine

## 2016-10-21 DIAGNOSIS — E118 Type 2 diabetes mellitus with unspecified complications: Secondary | ICD-10-CM | POA: Diagnosis not present

## 2016-10-21 DIAGNOSIS — E1169 Type 2 diabetes mellitus with other specified complication: Secondary | ICD-10-CM

## 2016-10-21 DIAGNOSIS — J441 Chronic obstructive pulmonary disease with (acute) exacerbation: Secondary | ICD-10-CM | POA: Diagnosis not present

## 2016-10-21 DIAGNOSIS — E1142 Type 2 diabetes mellitus with diabetic polyneuropathy: Secondary | ICD-10-CM

## 2016-10-21 DIAGNOSIS — J189 Pneumonia, unspecified organism: Secondary | ICD-10-CM

## 2016-10-21 DIAGNOSIS — I25118 Atherosclerotic heart disease of native coronary artery with other forms of angina pectoris: Secondary | ICD-10-CM

## 2016-10-21 DIAGNOSIS — W19XXXD Unspecified fall, subsequent encounter: Secondary | ICD-10-CM

## 2016-10-21 DIAGNOSIS — F319 Bipolar disorder, unspecified: Secondary | ICD-10-CM

## 2016-10-21 DIAGNOSIS — J181 Lobar pneumonia, unspecified organism: Secondary | ICD-10-CM | POA: Diagnosis not present

## 2016-10-21 DIAGNOSIS — E785 Hyperlipidemia, unspecified: Secondary | ICD-10-CM

## 2016-10-21 DIAGNOSIS — I119 Hypertensive heart disease without heart failure: Secondary | ICD-10-CM | POA: Diagnosis not present

## 2016-10-21 DIAGNOSIS — M25551 Pain in right hip: Secondary | ICD-10-CM | POA: Diagnosis not present

## 2016-10-21 NOTE — Progress Notes (Signed)
: Provider:  Noah Delaine. Sheppard Coil, MD Location:  [place] Room Number: 010 Place of Service:  SNF ([phone])  PCP: Beckie Salts, MD Patient Care Team: Beckie Salts, MD as PCP - General (Internal Medicine)  Extended Emergency Contact Information Primary Emergency Contact: [place] Mobile Phone: [phone] Relation: Daughter     Allergies: Rocephin [ceftriaxone sodium in dextrose]; Ampicillin; Lipitor [atorvastatin]; Prochlorperazine edisylate; Toradol [ketorolac tromethamine]; and Tramadol  Chief Complaint  Patient presents with  . New Admit To SNF    following hospitalization 10/17/16 to 10/20/16 right hit pain , fallen 5 days prior    HPI: Patient is 61 y.o. male with COPD, type 2 diabetes, coronary artery disease, bipolar disorder, chronic pain syndrome who was brought to [facility] ED via EMS for right hip pain. Symptoms have begun after a fall that had occurred 5 days prior, he tripped over his feet from a standing position from the commode. He was seen in [facility] regional for these symptoms immediately following a fall imaging studies were negative and patient was discharged home. Per patient and remove it now is excruciating and his pain has gotten worse affecting his ambulation. He cannot get off the couch without assistance in the past 5 days. He is on chronic oxycodone for his chronic hip joint pain as welder as degenerative joint disease. Patient was admitted to [facility] from 8/27-30 for pain control. Hospital course was complicated by an acute COPD exacerbation and a community-acquired pneumonia. Patient is admitted to skilled nursing facility with generalized weakness for OT/PT. While at skilled nursing facility patient will be followed for hypertension treated with Norvasc and lisinopril, diabetes mellitus 2 treated with metformin lisinopril and statin and polyneuropathy treated  with Neurontin.  Past Medical History:  Diagnosis Date  . Anginal pain ([place])   . Anxiety   . Arthritis    "right hip; herniated L4-5" (11/05/2015)  . Asthma   . Bipolar 1 disorder ([place])   . CHF (congestive heart failure) ([place])   . Chronic bronchitis ([place])   . Chronic lower back pain   . Chronic pain disorder   . COPD (chronic obstructive pulmonary disease) ([place])   . Coronary artery disease   . Coronary atherosclerosis 12/05/2008   Qualifier: Diagnosis of  By: Sidney Ace    . DEEP VENOUS THROMBOPHLEBITIS, HX OF 12/05/2008   Qualifier: Diagnosis of  By: Sidney Ace    . DEPRESSION, MAJOR 12/05/2008   Qualifier: Diagnosis of  By: Sidney Ace    . Diabetes mellitus type 2 with complications ([place]) 7/42/5956  . Diverticulitis large intestine   . DVT (deep venous thrombosis) ([place]) 1991   "left calf; after 1st knee scope"  . EROSIVE GASTRITIS 12/05/2008   Qualifier: Diagnosis of  By: Sidney Ace    . Fungal esophagitis   . GERD 12/05/2008   Qualifier: Diagnosis of  By: Sidney Ace    . GERD (gastroesophageal reflux disease)   . Heart attack ([place]) 2010   BMS x 2 RCA  . High cholesterol   . Hyperlipidemia   . Hypertension   . Hypothyroidism   . MRSA (methicillin resistant staph aureus) culture positive   . Myocardial infarction ([place])   . MYOCARDIAL INFARCTION, HX OF 12/05/2008   Qualifier: Diagnosis of  By: Sidney Ace    . Obesity 12/05/2008   Qualifier: Diagnosis of  By: Sidney Ace    . Pneumonia    "  several times"  . PULMONARY NODULE, LEFT LOWER LOBE 12/05/2008   Qualifier: Diagnosis of  By: Sidney Ace    . S/P angioplasty with stent 11/05/15 PCI DES to [facility]  11/06/2015  . Schizo affective schizophrenia ([place]) 2002  . Status post cholecystectomy 09/25/2014    Past Surgical History:  Procedure Laterality Date  . APPENDECTOMY    . CARDIAC CATHETERIZATION N/A 11/05/2015   Procedure: Left Heart Cath and Coronary Angiography;  Surgeon: Nelva Bush,  MD;  Location: [place] CV [facility];  Service: Cardiovascular;  Laterality: N/A;  . CARDIAC CATHETERIZATION N/A 11/05/2015   Procedure: Coronary Stent Intervention;  Surgeon: Nelva Bush, MD;  Location: [place] [facility];  Service: Cardiovascular;  Laterality: N/A;  . CARDIAC CATHETERIZATION N/A 11/05/2015   Procedure: Intravascular Pressure Wire/FFR Study;  Surgeon: Nelva Bush, MD;  Location: [place] [facility];  Service: Cardiovascular;  Laterality: N/A;  . CORONARY ANGIOPLASTY WITH STENT PLACEMENT  2010   90% RCA s/p BMS x 2, 50% LAD, 30% CFX, EF 45-50%  . CORONARY ANGIOPLASTY WITH STENT PLACEMENT  11/05/2015   "1 today; makes a total of 4 stents" (11/05/2015)  . INGUINAL HERNIA REPAIR Right   . KNEE ARTHROSCOPY Left X 3  . LAPAROSCOPIC CHOLECYSTECTOMY    . TONSILLECTOMY AND ADENOIDECTOMY    . UPPER GI ENDOSCOPY  05/24/2016   UGI ENDO INCLUDE ESOPHAGUS, STOMACH & DUODENUM &/OR JEJUNUM; DX W/WO COLLECTION SPECIMEN BY [facility] OR [place]; SURGEON :ALBERT San Morelle, MD LOCATION; [facility]    Allergies as of 10/21/2016      Reactions   Rocephin [ceftriaxone Sodium In Dextrose] Anaphylaxis   Ampicillin    Lipitor [atorvastatin]    Prochlorperazine Edisylate Other (See Comments)   Childhood Reaction.   Toradol [ketorolac Tromethamine] Nausea And Vomiting   Pt states he is not allergic to ibuprofen   Tramadol Nausea And Vomiting      Medication List       Accurate as of 10/21/16 12:00 PM. Always use your most recent med list.          albuterol 108 (90 Base) MCG/ACT inhaler Commonly known as:  PROVENTIL HFA;VENTOLIN HFA Inhale 2 puffs into the lungs every 6 (six) hours as needed for wheezing.   ALPRAZolam 0.25 MG tablet Commonly known as:  XANAX Take 0.25 mg by mouth. Take one tablet nightly as needed for sleep for up to 20 days.   amLODipine 10 MG tablet Commonly known as:  NORVASC Take 10 mg by mouth daily.   aspirin EC 81 MG tablet Take 81 mg by mouth  daily.   cyclobenzaprine 5 MG tablet Commonly known as:  FLEXERIL Take 5 mg by mouth. Take one tablet two times a day as needed for muscle spams   Fluticasone-Salmeterol 250-50 MCG/DOSE Aepb Commonly known as:  ADVAIR Inhale 1 puff into the lungs 2 (two) times daily.   gabapentin 300 MG capsule Commonly known as:  NEURONTIN Take 300 mg by mouth 3 (three) times daily.   ipratropium-albuterol 0.5-2.5 (3) MG/3ML Soln Commonly known as:  DUONEB Take 3 mLs by nebulization. Inhale 3 ml by nebulization every four hours as needed   levofloxacin 500 MG tablet Commonly known as:  LEVAQUIN Take 500 mg by mouth. Take one tablet for 5 days   levothyroxine 25 MCG tablet Commonly known as:  SYNTHROID, LEVOTHROID Take 25 mcg by mouth daily at 6 (six) AM.   lisinopril 5 MG tablet Commonly known as:  PRINIVIL,ZESTRIL Take 5 mg by  mouth daily.   metFORMIN 500 MG tablet Commonly known as:  GLUCOPHAGE Take 500 mg by mouth 2 (two) times daily with a meal.   MULTI-VITAMINS Tabs Take 1 tablet by mouth daily.   nitroGLYCERIN 0.4 MG SL tablet Commonly known as:  NITROSTAT Place 0.4 mg under the tongue every 5 (five) minutes as needed for chest pain.   omeprazole 20 MG capsule Commonly known as:  PRILOSEC Take 20 mg by mouth 2 (two) times daily.   Oxycodone HCl 10 MG Tabs Take 10 mg by mouth every 6 (six) hours.   polyethylene glycol packet Commonly known as:  MIRALAX / GLYCOLAX Take 17 g by mouth daily as needed for mild constipation.   pravastatin 40 MG tablet Commonly known as:  PRAVACHOL Take 40 mg by mouth daily.   predniSONE 10 MG tablet Commonly known as:  DELTASONE Take 10 mg by mouth. Take 3 tablets daily for 2 days, then 2 tablets for 2 days, then 1 tablet daily for 1 day, then until complete.   QUEtiapine 50 MG tablet Commonly known as:  SEROQUEL Take 50 mg by mouth. Take one tablet three times a day   temazepam 30 MG capsule Commonly known as:  RESTORIL Take 1  capsule (30 mg total) by mouth at bedtime.   venlafaxine 75 MG tablet Commonly known as:  EFFEXOR Take 75 mg by mouth daily.            Discharge Care Instructions        Start     Ordered   10/21/16 0000  CBC and differential    Comments:  This external order was created through the Results Console.    10/21/16 1200   10/21/16 0350  Basic metabolic panel    Comments:  This external order was created through the Results Console.    10/21/16 1200      Meds ordered this encounter  Medications  . cyclobenzaprine (FLEXERIL) 5 MG tablet    Sig: Take 5 mg by mouth. Take one tablet two times a day as needed for muscle spams  . levofloxacin (LEVAQUIN) 500 MG tablet    Sig: Take 500 mg by mouth. Take one tablet for 5 days  . predniSONE (DELTASONE) 10 MG tablet    Sig: Take 10 mg by mouth. Take 3 tablets daily for 2 days, then 2 tablets for 2 days, then 1 tablet daily for 1 day, then until complete.  . pravastatin (PRAVACHOL) 40 MG tablet    Sig: Take 40 mg by mouth daily.  Marland Kitchen ipratropium-albuterol (DUONEB) 0.5-2.5 (3) MG/3ML SOLN    Sig: Take 3 mLs by nebulization. Inhale 3 ml by nebulization every four hours as needed    Immunization History  Administered Date(s) Administered  . Influenza Nasal 11/23/2015  . Influenza, Seasonal, Injecte, Preservative Fre 11/22/2010, 12/12/2012, 02/15/2014  . Influenza,inj,Quad PF,6+ Mos 11/26/2012, 01/17/2015, 11/06/2015  . Influenza-Unspecified 02/26/2014  . PPD Test 07/01/2016  . Pneumococcal Polysaccharide-23 02/22/2008, 11/26/2012, 12/12/2012    Social History  Substance Use Topics  . Smoking status: Light Tobacco Smoker    Packs/day: 0.25    Years: 42.00    Types: Cigarettes  . Smokeless tobacco: Never Used     Comment: last attempt to quit 04/12/16, down to 6 cigs daily   . Alcohol use No    Family history is   Family History  Problem Relation Age of Onset  . Heart Problems Mother        76  .  Heart disease Father         23      Review of Systems  DATA OBTAINED: from patient, nurse GENERAL:  no fevers, fatigue, appetite changes SKIN: No itching, or rash EYES: No eye pain, redness, discharge EARS: No earache, tinnitus, change in hearing NOSE: No congestion, drainage or bleeding  MOUTH/THROAT: No mouth or tooth pain, No sore throat RESPIRATORY: No cough, wheezing, SOB CARDIAC: No chest pain, palpitations, lower extremity edema  GI: No abdominal pain, No N/V/D or constipation, No heartburn or reflux  GU: No dysuria, frequency or urgency, or incontinence  MUSCULOSKELETAL: No unrelieved bone/joint pain NEUROLOGIC: No headache, dizziness or focal weakness PSYCHIATRIC: No c/o anxiety or sadness   Vitals:   10/21/16 1133  BP: 138/72  Pulse: 80  Resp: 18  Temp: 98.7 F (37.1 C)  SpO2: 99%    SpO2 Readings from Last 1 Encounters:  10/21/16 99%   Body mass index is 32.55 kg/m.     Physical Exam  GENERAL APPEARANCE: Alert, conversant,  No acute distress.  SKIN: No diaphoresis rash HEAD: Normocephalic, atraumatic  EYES: Conjunctiva/lids clear. Pupils round, reactive. EOMs intact.  EARS: External exam WNL, canals clear. Hearing grossly normal.  NOSE: No deformity or discharge.  MOUTH/THROAT: Lips w/o lesions  RESPIRATORY: Breathing is even, unlabored. Lung sounds are clear   CARDIOVASCULAR: Heart RRR no murmurs, rubs or gallops. No peripheral edema.   GASTROINTESTINAL: Abdomen is soft, non-tender, not distended w/ normal bowel sounds. GENITOURINARY: Bladder non tender, not distended  MUSCULOSKELETAL: No abnormal joints or musculature NEUROLOGIC:  Cranial nerves 2-12 grossly intact. Moves all extremities  PSYCHIATRIC: Mood and affect appropriate to situation, no behavioral issues  Patient Active Problem List   Diagnosis Date Noted  . SOB (shortness of breath) 08/22/2016  . Hip pain, acute, right 07/06/2016  . Arthritis 07/06/2016  . Chronic lower back pain 07/06/2016  . Hypothyroidism  07/06/2016  . Diabetes mellitus type 2 with complications ([place]) 16/11/9602  . Chronic pain disorder   . Diverticulitis large intestine   . Hyperlipidemia   . Myocardial infarction ([place])   . S/P angioplasty with stent 11/05/15 PCI DES to mLAD  11/06/2015  . Ischemic chest pain   . Status post cholecystectomy 09/25/2014  . Chest pain 09/24/2014  . COPD exacerbation ([place]) 09/24/2014  . Chest pain of uncertain etiology   . Hyperlipidemia LDL goal <70 12/05/2008  . Obesity 12/05/2008  . DEPRESSION, MAJOR 12/05/2008  . Bipolar 1 disorder ([place]) 12/05/2008  . TOBACCO ABUSE 12/05/2008  . Hypertension 12/05/2008  . MYOCARDIAL INFARCTION, HX OF 12/05/2008  . Coronary atherosclerosis 12/05/2008  . COPD (chronic obstructive pulmonary disease) ([place]) 12/05/2008  . PULMONARY NODULE, LEFT LOWER LOBE 12/05/2008  . GERD 12/05/2008  . EROSIVE GASTRITIS 12/05/2008  . DEEP VENOUS THROMBOPHLEBITIS, HX OF 12/05/2008      Labs reviewed: Basic Metabolic Panel:    Component Value Date/Time   NA 138 10/20/2016   K 4.8 10/20/2016   CL 98 (L) 10/10/2016 1118   CO2 25 10/10/2016 1118   GLUCOSE 101 (H) 10/10/2016 1118   BUN 16 10/20/2016   CREATININE 0.8 10/20/2016   CREATININE 0.72 10/10/2016 1118   CALCIUM 9.3 10/10/2016 1118   PROT 7.2 10/10/2016 1118   ALBUMIN 4.3 10/10/2016 1118   AST 21 10/10/2016 1118   ALT 15 (L) 10/10/2016 1118   ALKPHOS 92 10/10/2016 1118   BILITOT 0.5 10/10/2016 1118   GFRNONAA >60 10/10/2016 1118   GFRAA >60 10/10/2016 1118  Recent Labs  11/06/15 0420 11/09/15 0523  08/07/16 08/12/16 10/10/16 1118 10/20/16  NA 135 134*  < > 134*  --  135 138  K 4.2 3.8  < > 3.9  --  4.2 4.8  CL 105 100*  --   --   --  98*  --   CO2 21* 22  --   --   --  25  --   GLUCOSE 112* 117*  --   --   --  101*  --   BUN 13 8  < > 18 12 9 16   CREATININE 0.81 0.73  < > 0.9 0.7 0.72 0.8  CALCIUM 8.9 9.3  --   --   --  9.3  --   < > = values in this interval not displayed. Liver  Function Tests:  Recent Labs  06/20/16 08/04/16 10/10/16 1118  AST 12* 13* 21  ALT 9* 12 15*  ALKPHOS 86 84 92  BILITOT  --   --  0.5  PROT  --   --  7.2  ALBUMIN  --   --  4.3    Recent Labs  10/10/16 1118  LIPASE 18   No results for input(s): AMMONIA in the last 8760 hours. CBC:  Recent Labs  11/06/15 0420 11/09/15 0523  08/12/16 10/10/16 1118 10/17/16  WBC 13.4* 10.9*  < > 6.7 10.0 7.6  NEUTROABS  --   --   --   --  7.4  --   HGB 12.7* 14.3  < > 13.1* 13.3 14.0  HCT 38.9* 42.9  < > 40* 37.9* 40*  MCV 86.4 85.5  --   --  84.4  --   PLT 187 197  < > 206 228 210  < > = values in this interval not displayed. Lipid  Recent Labs  02/23/16  CHOL 133  HDL 34*  LDLCALC 67  TRIG 159    Cardiac Enzymes:  Recent Labs  11/05/15 0530 11/05/15 0830 10/10/16 1118  CKTOTAL  --   --  55  TROPONINI <0.03 <0.03  --    BNP:  Recent Labs  11/05/15 0530 11/09/15 0523  BNP 6.0 7.3   No results found for: [facility] Lab Results  Component Value Date   HGBA1C  11/19/2008    5.2 (NOTE) The ADA recommends the following therapeutic goal for glycemic control related to Hgb A1c measurement: Goal of therapy: <6.5 Hgb A1c  Reference: American Diabetes Association: Clinical Practice Recommendations 2010, Diabetes Care, 2010, 33: (Suppl  1).   Lab Results  Component Value Date   TSH 3.21 02/23/2016   No results found for: VITAMINB12 No results found for: FOLATE No results found for: IRON, TIBC, FERRITIN  Imaging and Procedures obtained prior to SNF admission: Dg Chest 2 View  Result Date: 10/10/2016 CLINICAL DATA:  Patient with cough and abdominal pain. EXAM: CHEST  2 VIEW COMPARISON:  Chest radiograph 09/04/2016. FINDINGS: Normal cardiac and mediastinal contours. No consolidative pulmonary opacities. No pleural effusion or pneumothorax. Thoracic spine degenerative changes. IMPRESSION: No acute cardiopulmonary process. Probable basilar atelectasis or scarring.  Electronically Signed   By: Lovey Newcomer M.D.   On: 10/10/2016 11:48   Ct Abdomen Pelvis W Contrast  Result Date: 10/10/2016 CLINICAL DATA:  All over abdominal pain and tenderness since Thursday. Prior appendectomy. EXAM: CT ABDOMEN AND PELVIS WITH CONTRAST TECHNIQUE: Multidetector CT imaging of the abdomen and pelvis was performed using the standard protocol following bolus administration of intravenous contrast.  CONTRAST:  171mL ISOVUE-300 IOPAMIDOL (ISOVUE-300) INJECTION 61% COMPARISON:  09/04/2016. FINDINGS: Lower chest: 7 mm left lower lobe pulmonary nodule is unchanged comparing back to a scan from 11/03/2013, consistent with benign etiology. Hepatobiliary: The liver shows diffusely decreased attenuation suggesting steatosis. Gallbladder surgically absent. No intrahepatic or extrahepatic biliary dilation. Pancreas: No focal mass lesion. No dilatation of the main duct. No intraparenchymal cyst. No peripancreatic edema. Spleen: No splenomegaly. No focal mass lesion. Adrenals/Urinary Tract: No adrenal nodule or mass. 7 mm low-density lesion interpolar left kidney similar to 715 2018 a second 6 mm low-density lesion interpolar left kidney is also stable. Right kidney unremarkable. No evidence for hydroureter. Bladder is moderately distended. Stomach/Bowel: Stomach is nondistended. No gastric wall thickening. No evidence of outlet obstruction. Duodenum is normally positioned as is the ligament of Treitz. No small bowel wall thickening. No small bowel dilatation. The terminal ileum is normal. Nonvisualization of the appendix is consistent with the reported history of appendectomy. Diverticuli are seen scattered along the entire length of the colon without CT findings of diverticulitis. Vascular/Lymphatic: There is abdominal aortic atherosclerosis without aneurysm. There is no gastrohepatic or hepatoduodenal ligament lymphadenopathy. No intraperitoneal or retroperitoneal lymphadenopathy. No pelvic sidewall  lymphadenopathy. Reproductive: The prostate gland and seminal vesicles have normal imaging features. Other: No intraperitoneal free fluid. Musculoskeletal: Degenerative changes noted in the right acetabulum. Bone windows reveal no worrisome lytic or sclerotic osseous lesions. Degenerative changes noted L4-5. IMPRESSION: 1. No acute findings in the abdomen or pelvis. Specifically, no findings to explain the patient's history of pain and tenderness. 2. Stable 7 mm left lower lobe pulmonary nodule comparing back to 11/03/2013. This is compatible with benign etiology. 3.  Aortic Atherosclerois (ICD10-170.0) 4. Hepatic steatosis. 5. Colonic diverticulosis without diverticulitis. Electronically Signed   By: Misty Stanley M.D.   On: 10/10/2016 13:41     Not all labs, radiology exams or other studies done during hospitalization come through on my EPIC note; however they are reviewed by me.    Assessment and Plan  ACUTE ON CHRONIC RIGHT HIP PAIN/FALL-patient fell 5 days prior to admission to the hospital; he did not break his hip but his pain was exacerbated to the point that he could not ambulate and so was admitted to the hospital for physical therapy and pain control SNF - patient is admitted to skilled nursing facility for continued OT/PT; patient has been taking chronically oxycodone 10 mg every 4 hours at home, this will be continued, this probably needs to be reevaluated and patient needs to be sent to a pain clinic  COPD EXACERBATION/CPAP-patient was found to have a right lower lobe infiltrate and to be in a COPD exacerbation. Patient is not on home oxygen, was treated initially with O2 in the hospital treated with antibiotics not stated what which ones, Solu-Medrol that was transitioned to oral prednisone taper and DuoNeb's with good improvement SNF - continue Levaquin 500 mg by mouth daily for 5 more days, finish prednisone taper, continue albuterol when necessary Advair Diskus 1 puff twice a day and  urinalysis when necessary; patient is not currently requiring O2  DM 2-patient had formerly been on metformin but had not been taking it recently; it was resumed in the hospital as his blood sugars were clicking because of steroids SNF - continue metformin 500 mg twice a day; we'll monitor blood sugars daily; patient is on lisinopril and a statin  CAD-status post stent; no acute issues in the hospital SNF - continue ASA 81 mg daily  and risk factor control with hypertension, and statin; patient also has sublingual nitroglycerin when necessary  BIPOLAR DISEASE SNF - appears stable; patient is on Effexor 75 mg daily, will continue and Seroquel 50 mg 3 times a day  HYPERTENSION SNF - controlled; continue Norvasc 10 mg by mouth daily and lisinopril 5 mg by mouth daily  DIABETIC POLYNEUROPATHY SNF - controlled; continue Neurontin 300 mg by mouth 3 times a day  HYPERLIPIDEMIA SNF -not stated as uncontrolled; continue Pravachol 40 mg by mouth daily   Time spent greater than 45 minutes;> 50% of time with patient was spent reviewing records, labs, tests and studies, counseling and developing plan of care  Webb Silversmith D. Sheppard Coil, MD

## 2016-10-25 ENCOUNTER — Other Ambulatory Visit: Payer: Self-pay | Admitting: Internal Medicine

## 2016-10-25 LAB — BASIC METABOLIC PANEL
BUN: 9 (ref 4–21)
Creatinine: 0.6 (ref 0.6–1.3)
Glucose: 103
Potassium: 4.1 (ref 3.4–5.3)
SODIUM: 140 (ref 137–147)

## 2016-10-25 LAB — CBC AND DIFFERENTIAL
HEMATOCRIT: 36 — AB (ref 41–53)
HEMOGLOBIN: 12 — AB (ref 13.5–17.5)
Platelets: 148 — AB (ref 150–399)
WBC: 12.6

## 2016-10-30 ENCOUNTER — Encounter: Payer: Self-pay | Admitting: Internal Medicine

## 2016-10-30 DIAGNOSIS — J189 Pneumonia, unspecified organism: Secondary | ICD-10-CM

## 2016-10-30 DIAGNOSIS — E785 Hyperlipidemia, unspecified: Secondary | ICD-10-CM | POA: Insufficient documentation

## 2016-10-30 DIAGNOSIS — W19XXXA Unspecified fall, initial encounter: Secondary | ICD-10-CM | POA: Insufficient documentation

## 2016-10-30 DIAGNOSIS — E1142 Type 2 diabetes mellitus with diabetic polyneuropathy: Secondary | ICD-10-CM | POA: Insufficient documentation

## 2016-10-30 DIAGNOSIS — E1169 Type 2 diabetes mellitus with other specified complication: Secondary | ICD-10-CM | POA: Insufficient documentation

## 2016-10-30 HISTORY — DX: Pneumonia, unspecified organism: J18.9

## 2016-11-01 ENCOUNTER — Non-Acute Institutional Stay (SKILLED_NURSING_FACILITY): Payer: Medicare HMO | Admitting: Internal Medicine

## 2016-11-01 ENCOUNTER — Encounter: Payer: Self-pay | Admitting: Internal Medicine

## 2016-11-01 DIAGNOSIS — R5081 Fever presenting with conditions classified elsewhere: Secondary | ICD-10-CM | POA: Diagnosis not present

## 2016-11-01 DIAGNOSIS — R600 Localized edema: Secondary | ICD-10-CM | POA: Diagnosis not present

## 2016-11-01 NOTE — Progress Notes (Signed)
Location:  [place] Room Number: [place]:  SNF [phone])  Provider: Noah Delaine. Sheppard Coil, MD  Beckie Salts, MD  Patient Care Team: Beckie Salts, MD as PCP - General (Internal Medicine)  Extended Emergency Contact Information Primary Emergency Contact: [place] Mobile Phone: [phone] Relation: Daughter    Allergies: Rocephin [ceftriaxone sodium in dextrose]; Ampicillin; Lipitor [atorvastatin]; Prochlorperazine edisylate; Toradol [ketorolac tromethamine]; and Tramadol  Chief Complaint  Patient presents with  . Acute Visit    swelling feet    HPI: Patient is 61 y.o. male who Is being seen for 2 problems; #1 swelling feet on and off for the last week, dyspnea on exertion, orthopnea  #2 fever 100.1 today; denies feeling bad or sick; patient has just finished antibiotics for treatment for pneumonia, it was a reason he is admitted to the skilled nursing facility; today patient has had diarrhea 2 times loose, no vomiting, no chills no muscle aches, no URI symptoms  Past Medical History:  Diagnosis Date  . Anginal pain ([place])   . Anxiety   . Arthritis    "right hip; herniated L4-5" (11/05/2015)  . Asthma   . Bipolar 1 disorder ([place])   . CHF (congestive heart failure) ([place])   . Chronic bronchitis ([place])   . Chronic lower back pain   . Chronic pain disorder   . COPD (chronic obstructive pulmonary disease) ([place])   . Coronary artery disease   . Coronary atherosclerosis 12/05/2008   Qualifier: Diagnosis of  By: Sidney Ace    . DEEP VENOUS THROMBOPHLEBITIS, HX OF 12/05/2008   Qualifier: Diagnosis of  By: Sidney Ace    . DEPRESSION, MAJOR 12/05/2008   Qualifier: Diagnosis of  By: Sidney Ace    . Diabetes mellitus type 2 with complications ([place]) 7/42/5956  . Diverticulitis large intestine   . DVT (deep venous thrombosis) ([place]) 1991   "left calf; after 1st knee scope"  . EROSIVE GASTRITIS 12/05/2008    Qualifier: Diagnosis of  By: Sidney Ace    . Fungal esophagitis   . GERD 12/05/2008   Qualifier: Diagnosis of  By: Sidney Ace    . GERD (gastroesophageal reflux disease)   . Heart attack ([place]) 2010   BMS x 2 RCA  . High cholesterol   . Hyperlipidemia   . Hypertension   . Hypothyroidism   . MRSA (methicillin resistant staph aureus) culture positive   . Myocardial infarction ([place])   . MYOCARDIAL INFARCTION, HX OF 12/05/2008   Qualifier: Diagnosis of  By: Sidney Ace    . Obesity 12/05/2008   Qualifier: Diagnosis of  By: Sidney Ace    . Pneumonia    "several times"  . PULMONARY NODULE, LEFT LOWER LOBE 12/05/2008   Qualifier: Diagnosis of  By: Sidney Ace    . S/P angioplasty with stent 11/05/15 PCI DES to [facility]  11/06/2015  . Schizo affective schizophrenia ([place]) 2002  . Status post cholecystectomy 09/25/2014    Past Surgical History:  Procedure Laterality Date  . APPENDECTOMY    . CARDIAC CATHETERIZATION N/A 11/05/2015   Procedure: Left Heart Cath and Coronary Angiography;  Surgeon: Nelva Bush, MD;  Location: [place] [facility];  Service: Cardiovascular;  Laterality: N/A;  . CARDIAC CATHETERIZATION N/A 11/05/2015   Procedure: Coronary Stent Intervention;  Surgeon: Nelva Bush, MD;  Location: [facility];  Service: Cardiovascular;  Laterality: N/A;  . CARDIAC CATHETERIZATION N/A 11/05/2015   Procedure: Intravascular Pressure  Wire/FFR Study;  Surgeon: Nelva Bush, MD;  Location: [place] [facility];  Service: Cardiovascular;  Laterality: N/A;  . CORONARY ANGIOPLASTY WITH STENT PLACEMENT  2010   90% RCA s/p BMS x 2, 50% LAD, 30% CFX, EF 45-50%  . CORONARY ANGIOPLASTY WITH STENT PLACEMENT  11/05/2015   "1 today; makes a total of 4 stents" (11/05/2015)  . INGUINAL HERNIA REPAIR Right   . KNEE ARTHROSCOPY Left X 3  . LAPAROSCOPIC CHOLECYSTECTOMY    . TONSILLECTOMY AND ADENOIDECTOMY    . UPPER GI ENDOSCOPY  05/24/2016   UGI ENDO INCLUDE ESOPHAGUS,  STOMACH & DUODENUM &/OR JEJUNUM; DX W/WO COLLECTION SPECIMEN BY [facility] OR [place]; SURGEON :ALBERT San Morelle, MD LOCATION; [facility]    Allergies as of 11/01/2016      Reactions   Rocephin [ceftriaxone Sodium In Dextrose] Anaphylaxis   Ampicillin    Lipitor [atorvastatin]    Prochlorperazine Edisylate Other (See Comments)   Childhood Reaction.   Toradol [ketorolac Tromethamine] Nausea And Vomiting   Pt states he is not allergic to ibuprofen   Tramadol Nausea And Vomiting      Medication List       Accurate as of 11/01/16 11:59 PM. Always use your most recent med list.          albuterol 108 (90 Base) MCG/ACT inhaler Commonly known as:  PROVENTIL HFA;VENTOLIN HFA Inhale 2 puffs into the lungs every 6 (six) hours as needed for wheezing.   ALPRAZolam 0.25 MG tablet Commonly known as:  XANAX Take 0.25 mg by mouth. Take one tablet nightly as needed for sleep for up to 20 days. Stop date 11/08/16   amLODipine 10 MG tablet Commonly known as:  NORVASC Take 10 mg by mouth daily.   aspirin EC 81 MG tablet Take 81 mg by mouth daily.   cyclobenzaprine 5 MG tablet Commonly known as:  FLEXERIL Take 5 mg by mouth. Take one tablet three times a day as needed for muscle spams   Fluticasone-Salmeterol 250-50 MCG/DOSE Aepb Commonly known as:  ADVAIR Inhale 1 puff into the lungs 2 (two) times daily.   gabapentin 300 MG capsule Commonly known as:  NEURONTIN Take 300 mg by mouth 3 (three) times daily.   ipratropium-albuterol 0.5-2.5 (3) MG/3ML Soln Commonly known as:  DUONEB Take 3 mLs by nebulization. Inhale 3 ml by nebulization every four hours as needed   levothyroxine 25 MCG tablet Commonly known as:  SYNTHROID, LEVOTHROID Take 25 mcg by mouth daily at 6 (six) AM.   lisinopril 5 MG tablet Commonly known as:  PRINIVIL,ZESTRIL Take 5 mg by mouth daily.   metFORMIN 500 MG tablet Commonly known as:  GLUCOPHAGE Take 500 mg by mouth 2 (two) times daily with a meal.     MULTI-VITAMINS Tabs Take 1 tablet by mouth daily.   nitroGLYCERIN 0.4 MG SL tablet Commonly known as:  NITROSTAT Place 0.4 mg under the tongue every 5 (five) minutes as needed for chest pain.   omeprazole 20 MG capsule Commonly known as:  PRILOSEC Take 20 mg by mouth 2 (two) times daily.   Oxycodone HCl 10 MG Tabs Take 10 mg by mouth. Take one tablet every 4 hours as needed for pain   polyethylene glycol packet Commonly known as:  MIRALAX / GLYCOLAX Take 17 g by mouth daily as needed for mild constipation.   QUEtiapine 50 MG tablet Commonly known as:  SEROQUEL Take 50 mg by mouth. Take one tablet three times a day  temazepam 30 MG capsule Commonly known as:  RESTORIL Take 1 capsule (30 mg total) by mouth at bedtime.   venlafaxine 75 MG tablet Commonly known as:  EFFEXOR Take 75 mg by mouth daily.       No orders of the defined types were placed in this encounter.   Immunization History  Administered Date(s) Administered  . Influenza Nasal 11/23/2015  . Influenza, Seasonal, Injecte, Preservative Fre 11/22/2010, 12/12/2012, 02/15/2014  . Influenza,inj,Quad PF,6+ Mos 11/26/2012, 01/17/2015, 11/06/2015  . Influenza-Unspecified 02/26/2014  . PPD Test 07/01/2016  . Pneumococcal Polysaccharide-23 02/22/2008, 11/26/2012, 12/12/2012    Social History  Substance Use Topics  . Smoking status: Light Tobacco Smoker    Packs/day: 0.25    Years: 42.00    Types: Cigarettes  . Smokeless tobacco: Never Used     Comment: last attempt to quit 04/12/16, down to 6 cigs daily   . Alcohol use No    Review of Systems  DATA OBTAINED: from patient, nurse GENERAL:  Low-grade fever, no fatigue, no appetite changes SKIN: No itching, rash HEENT: No complaint RESPIRATORY: No cough, wheezing, SOB CARDIAC: No chest pain, palpitations, lower extremity edema  GI: No abdominal pain, No N/V; will still 2; no constipation, No heartburn or reflux  GU: No dysuria, frequency or urgency, or  incontinence  MUSCULOSKELETAL: No unrelieved bone/joint pain NEUROLOGIC: No headache, dizziness  PSYCHIATRIC: No overt anxiety or sadness  Vitals:   11/01/16 1656  BP: 111/74  Pulse: 88  Resp: 18  Temp: 98.9 F (37.2 C)   Body mass index is 36.08 kg/m. Physical Exam  GENERAL APPEARANCE: Alert, conversant, No acute distress  SKIN: No diaphoresis rash HEENT: Unremarkable RESPIRATORY: Breathing is even, unlabored. Lung sounds are clear   CARDIOVASCULAR: Heart RRR no murmurs, rubs or gallops. 1-2+ edema feet and ankles  GASTROINTESTINAL: Abdomen is soft, non-tender, not distended w/ normal bowel sounds.  GENITOURINARY: Bladder non tender, not distended  MUSCULOSKELETAL: No abnormal joints or musculature NEUROLOGIC: Cranial nerves 2-12 grossly intact. Moves all extremities PSYCHIATRIC: Mood and affect appropriate to situation, no behavioral issues  Patient Active Problem List   Diagnosis Date Noted  . Fall 10/30/2016  . Community acquired pneumonia 10/30/2016  . Diabetic polyneuropathy ([place]) 10/30/2016  . Hyperlipidemia associated with type 2 diabetes mellitus ([place]) 10/30/2016  . SOB (shortness of breath) 08/22/2016  . Hip pain, acute, right 07/06/2016  . Arthritis 07/06/2016  . Chronic lower back pain 07/06/2016  . Hypothyroidism 07/06/2016  . Diabetes mellitus type 2 with complications ([place]) 95/62/1308  . Chronic pain disorder   . Diverticulitis large intestine   . Hyperlipidemia   . Myocardial infarction ([place])   . S/P angioplasty with stent 11/05/15 PCI DES to mLAD  11/06/2015  . Ischemic chest pain   . Status post cholecystectomy 09/25/2014  . Chest pain 09/24/2014  . COPD exacerbation ([place]) 09/24/2014  . Chest pain of uncertain etiology   . Hyperlipidemia LDL goal <70 12/05/2008  . Obesity 12/05/2008  . DEPRESSION, MAJOR 12/05/2008  . Bipolar 1 disorder ([place]) 12/05/2008  . TOBACCO ABUSE 12/05/2008  . Hypertensive heart disease 12/05/2008  . MYOCARDIAL  INFARCTION, HX OF 12/05/2008  . Coronary artery disease 12/05/2008  . COPD (chronic obstructive pulmonary disease) ([place]) 12/05/2008  . PULMONARY NODULE, LEFT LOWER LOBE 12/05/2008  . GERD 12/05/2008  . EROSIVE GASTRITIS 12/05/2008  . DEEP VENOUS THROMBOPHLEBITIS, HX OF 12/05/2008    CMP     Component Value Date/Time   NA 140 10/25/2016   K  4.1 10/25/2016   CL 98 (L) 10/10/2016 1118   CO2 25 10/10/2016 1118   GLUCOSE 101 (H) 10/10/2016 1118   BUN 9 10/25/2016   CREATININE 0.6 10/25/2016   CREATININE 0.72 10/10/2016 1118   CALCIUM 9.3 10/10/2016 1118   PROT 7.2 10/10/2016 1118   ALBUMIN 4.3 10/10/2016 1118   AST 21 10/10/2016 1118   ALT 15 (L) 10/10/2016 1118   ALKPHOS 92 10/10/2016 1118   BILITOT 0.5 10/10/2016 1118   GFRNONAA >60 10/10/2016 1118   GFRAA >60 10/10/2016 1118    Recent Labs  11/06/15 0420 11/09/15 0523  10/10/16 1118 10/20/16 10/25/16  NA 135 134*  < > 135 138 140  K 4.2 3.8  < > 4.2 4.8 4.1  CL 105 100*  --  98*  --   --   CO2 21* 22  --  25  --   --   GLUCOSE 112* 117*  --  101*  --   --   BUN 13 8  < > 9 16 9   CREATININE 0.81 0.73  < > 0.72 0.8 0.6  CALCIUM 8.9 9.3  --  9.3  --   --   < > = values in this interval not displayed.  Recent Labs  06/20/16 08/04/16 10/10/16 1118  AST 12* 13* 21  ALT 9* 12 15*  ALKPHOS 86 84 92  BILITOT  --   --  0.5  PROT  --   --  7.2  ALBUMIN  --   --  4.3    Recent Labs  11/06/15 0420 11/09/15 0523  10/10/16 1118 10/17/16 10/25/16  WBC 13.4* 10.9*  < > 10.0 7.6 12.6  NEUTROABS  --   --   --  7.4  --   --   HGB 12.7* 14.3  < > 13.3 14.0 12.0*  HCT 38.9* 42.9  < > 37.9* 40* 36*  MCV 86.4 85.5  --  84.4  --   --   PLT 187 197  < > 228 210 148*  < > = values in this interval not displayed.  Recent Labs  02/23/16  CHOL 133  LDLCALC 67  TRIG 159   No results found for: MICROALBUR Lab Results  Component Value Date   TSH 3.21 02/23/2016   Lab Results  Component Value Date   HGBA1C  11/19/2008      5.2 (NOTE) The ADA recommends the following therapeutic goal for glycemic control related to Hgb A1c measurement: Goal of therapy: <6.5 Hgb A1c  Reference: American Diabetes Association: Clinical Practice Recommendations 2010, Diabetes Care, 2010, 33: (Suppl  1).   Lab Results  Component Value Date   CHOL 133 02/23/2016   HDL 34 (A) 02/23/2016   LDLCALC 67 02/23/2016   TRIG 159 02/23/2016   CHOLHDL 6.5 11/20/2008    Significant Diagnostic Results in last 30 days:  Dg Chest 2 View  Result Date: 10/10/2016 CLINICAL DATA:  Patient with cough and abdominal pain. EXAM: CHEST  2 VIEW COMPARISON:  Chest radiograph 09/04/2016. FINDINGS: Normal cardiac and mediastinal contours. No consolidative pulmonary opacities. No pleural effusion or pneumothorax. Thoracic spine degenerative changes. IMPRESSION: No acute cardiopulmonary process. Probable basilar atelectasis or scarring. Electronically Signed   By: Lovey Newcomer M.D.   On: 10/10/2016 11:48   Ct Abdomen Pelvis W Contrast  Result Date: 10/10/2016 CLINICAL DATA:  All over abdominal pain and tenderness since Thursday. Prior appendectomy. EXAM: CT ABDOMEN AND PELVIS WITH CONTRAST TECHNIQUE: Multidetector CT imaging  of the abdomen and pelvis was performed using the standard protocol following bolus administration of intravenous contrast. CONTRAST:  144mL ISOVUE-300 IOPAMIDOL (ISOVUE-300) INJECTION 61% COMPARISON:  09/04/2016. FINDINGS: Lower chest: 7 mm left lower lobe pulmonary nodule is unchanged comparing back to a scan from 11/03/2013, consistent with benign etiology. Hepatobiliary: The liver shows diffusely decreased attenuation suggesting steatosis. Gallbladder surgically absent. No intrahepatic or extrahepatic biliary dilation. Pancreas: No focal mass lesion. No dilatation of the main duct. No intraparenchymal cyst. No peripancreatic edema. Spleen: No splenomegaly. No focal mass lesion. Adrenals/Urinary Tract: No adrenal nodule or mass. 7 mm  low-density lesion interpolar left kidney similar to 715 2018 a second 6 mm low-density lesion interpolar left kidney is also stable. Right kidney unremarkable. No evidence for hydroureter. Bladder is moderately distended. Stomach/Bowel: Stomach is nondistended. No gastric wall thickening. No evidence of outlet obstruction. Duodenum is normally positioned as is the ligament of Treitz. No small bowel wall thickening. No small bowel dilatation. The terminal ileum is normal. Nonvisualization of the appendix is consistent with the reported history of appendectomy. Diverticuli are seen scattered along the entire length of the colon without CT findings of diverticulitis. Vascular/Lymphatic: There is abdominal aortic atherosclerosis without aneurysm. There is no gastrohepatic or hepatoduodenal ligament lymphadenopathy. No intraperitoneal or retroperitoneal lymphadenopathy. No pelvic sidewall lymphadenopathy. Reproductive: The prostate gland and seminal vesicles have normal imaging features. Other: No intraperitoneal free fluid. Musculoskeletal: Degenerative changes noted in the right acetabulum. Bone windows reveal no worrisome lytic or sclerotic osseous lesions. Degenerative changes noted L4-5. IMPRESSION: 1. No acute findings in the abdomen or pelvis. Specifically, no findings to explain the patient's history of pain and tenderness. 2. Stable 7 mm left lower lobe pulmonary nodule comparing back to 11/03/2013. This is compatible with benign etiology. 3.  Aortic Atherosclerois (ICD10-170.0) 4. Hepatic steatosis. 5. Colonic diverticulosis without diverticulitis. Electronically Signed   By: Misty Stanley M.D.   On: 10/10/2016 13:41    Assessment and Plan  PEDAL EDEMA-patient is on Norvasc will DC Norvasc and increase lisinopril to 10 mg a day to cover for blood pressure; expect edema to improve also with steroid taper is finished; we will monitor  FEVER-patient just finished antibiotics last week for pneumonia; he has  no symptoms referable to upper or lower respiratory systems; loose stool 2 without any other symptoms; probably viral, but is certainly prudent to order a C. difficile and a UA     Samanthia Howland D. Sheppard Coil, MD

## 2016-11-02 ENCOUNTER — Non-Acute Institutional Stay (SKILLED_NURSING_FACILITY): Payer: Medicare HMO | Admitting: Internal Medicine

## 2016-11-02 ENCOUNTER — Encounter: Payer: Self-pay | Admitting: Internal Medicine

## 2016-11-02 DIAGNOSIS — F319 Bipolar disorder, unspecified: Secondary | ICD-10-CM | POA: Diagnosis not present

## 2016-11-02 DIAGNOSIS — G894 Chronic pain syndrome: Secondary | ICD-10-CM

## 2016-11-02 DIAGNOSIS — E1142 Type 2 diabetes mellitus with diabetic polyneuropathy: Secondary | ICD-10-CM | POA: Diagnosis not present

## 2016-11-02 DIAGNOSIS — E1169 Type 2 diabetes mellitus with other specified complication: Secondary | ICD-10-CM | POA: Diagnosis not present

## 2016-11-02 DIAGNOSIS — J441 Chronic obstructive pulmonary disease with (acute) exacerbation: Secondary | ICD-10-CM

## 2016-11-02 DIAGNOSIS — E118 Type 2 diabetes mellitus with unspecified complications: Secondary | ICD-10-CM | POA: Diagnosis not present

## 2016-11-02 DIAGNOSIS — I25118 Atherosclerotic heart disease of native coronary artery with other forms of angina pectoris: Secondary | ICD-10-CM | POA: Diagnosis not present

## 2016-11-02 DIAGNOSIS — J181 Lobar pneumonia, unspecified organism: Secondary | ICD-10-CM

## 2016-11-02 DIAGNOSIS — M25551 Pain in right hip: Secondary | ICD-10-CM

## 2016-11-02 DIAGNOSIS — W19XXXD Unspecified fall, subsequent encounter: Secondary | ICD-10-CM | POA: Diagnosis not present

## 2016-11-02 DIAGNOSIS — J189 Pneumonia, unspecified organism: Secondary | ICD-10-CM

## 2016-11-02 DIAGNOSIS — E785 Hyperlipidemia, unspecified: Secondary | ICD-10-CM | POA: Diagnosis not present

## 2016-11-02 DIAGNOSIS — I119 Hypertensive heart disease without heart failure: Secondary | ICD-10-CM | POA: Diagnosis not present

## 2016-11-02 NOTE — Progress Notes (Signed)
Location:  [facility] Room Number: [place]:  SNF [phone])  Provider: Noah Delaine. Sheppard Coil, MD  PCP: Beckie Salts, MD Patient Care Team: Beckie Salts, MD as PCP - General (Internal Medicine)  Extended Emergency Contact Information Primary Emergency Contact: [place] Mobile Phone: [phone] Relation: Daughter  Allergies  Allergen Reactions  . Rocephin [Ceftriaxone Sodium In Dextrose] Anaphylaxis  . Ampicillin   . Lipitor [Atorvastatin]   . Prochlorperazine Edisylate Other (See Comments)    Childhood Reaction.  . Toradol [Ketorolac Tromethamine] Nausea And Vomiting    Pt states he is not allergic to ibuprofen  . Tramadol Nausea And Vomiting    Chief Complaint  Patient presents with  . Discharge Note    discharge from SNF to home    HPI:  61 y.o. male  with COPD, type 2 diabetes, coronary artery disease, bipolar disorder, and chronic pain syndrome of right hip, who instructed the [place] [facility] emergency department after a fall onto his hip which rendered him unable to ambulate. Patient was admitted to [facility] from 8/27-30 for pain control. Hospital course was complicated by an acute COPD exacerbation and a community-acquired pneumonia, both of which resolved. Patient was admitted to skilled nursing facility with generalized weakness for OT/PT and is now ready to be discharged to home.    Past Medical History:  Diagnosis Date  . Anginal pain ([place])   . Anxiety   . Arthritis    "right hip; herniated L4-5" (11/05/2015)  . Asthma   . Bipolar 1 disorder ([place])   . CHF (congestive heart failure) ([place])   . Chronic bronchitis ([place])   . Chronic lower back pain   . Chronic pain disorder   . COPD (chronic obstructive pulmonary disease) ([place])   . Coronary artery disease   . Coronary atherosclerosis 12/05/2008   Qualifier: Diagnosis of  By: Sidney Ace    . DEEP VENOUS  THROMBOPHLEBITIS, HX OF 12/05/2008   Qualifier: Diagnosis of  By: Sidney Ace    . DEPRESSION, MAJOR 12/05/2008   Qualifier: Diagnosis of  By: Sidney Ace    . Diabetes mellitus type 2 with complications ([place]) 3/61/4431  . Diverticulitis large intestine   . DVT (deep venous thrombosis) ([place]) 1991   "left calf; after 1st knee scope"  . EROSIVE GASTRITIS 12/05/2008   Qualifier: Diagnosis of  By: Sidney Ace    . Fungal esophagitis   . GERD 12/05/2008   Qualifier: Diagnosis of  By: Sidney Ace    . GERD (gastroesophageal reflux disease)   . Heart attack ([place]) 2010   BMS x 2 RCA  . High cholesterol   . Hyperlipidemia   . Hypertension   . Hypothyroidism   . MRSA (methicillin resistant staph aureus) culture positive   . Myocardial infarction ([place])   . MYOCARDIAL INFARCTION, HX OF 12/05/2008   Qualifier: Diagnosis of  By: Sidney Ace    . Obesity 12/05/2008   Qualifier: Diagnosis of  By: Sidney Ace    . Pneumonia    "several times"  . PULMONARY NODULE, LEFT LOWER LOBE 12/05/2008   Qualifier: Diagnosis of  By: Sidney Ace    . S/P angioplasty with stent 11/05/15 PCI DES to [facility]  11/06/2015  . Schizo affective schizophrenia ([place]) 2002  . Status post cholecystectomy 09/25/2014    Past Surgical History:  Procedure Laterality Date  . APPENDECTOMY    . CARDIAC CATHETERIZATION N/A 11/05/2015  Procedure: Left Heart Cath and Coronary Angiography;  Surgeon: Nelva Bush, MD;  Location: [place] [facility];  Service: Cardiovascular;  Laterality: N/A;  . CARDIAC CATHETERIZATION N/A 11/05/2015   Procedure: Coronary Stent Intervention;  Surgeon: Nelva Bush, MD;  Location: [place] [facility];  Service: Cardiovascular;  Laterality: N/A;  . CARDIAC CATHETERIZATION N/A 11/05/2015   Procedure: Intravascular Pressure Wire/FFR Study;  Surgeon: Nelva Bush, MD;  Location: [place] [facility];  Service: Cardiovascular;  Laterality: N/A;  . CORONARY ANGIOPLASTY WITH STENT  PLACEMENT  2010   90% RCA s/p BMS x 2, 50% LAD, 30% CFX, EF 45-50%  . CORONARY ANGIOPLASTY WITH STENT PLACEMENT  11/05/2015   "1 today; makes a total of 4 stents" (11/05/2015)  . INGUINAL HERNIA REPAIR Right   . KNEE ARTHROSCOPY Left X 3  . LAPAROSCOPIC CHOLECYSTECTOMY    . TONSILLECTOMY AND ADENOIDECTOMY    . UPPER GI ENDOSCOPY  05/24/2016   UGI ENDO INCLUDE ESOPHAGUS, STOMACH & DUODENUM &/OR JEJUNUM; DX W/WO COLLECTION SPECIMEN BY [facility] OR [place]; SURGEON :ALBERT San Morelle, MD LOCATION; [place]     reports that he has been smoking Cigarettes.  He has a 10.50 pack-year smoking history. He has never used smokeless tobacco. He reports that he does not drink alcohol or use drugs. Social History   Social History  . Marital status: Divorced    Spouse name: N/A  . Number of children: N/A  . Years of education: N/A   Occupational History  . Disabled    Social History Main Topics  . Smoking status: Light Tobacco Smoker    Packs/day: 0.25    Years: 42.00    Types: Cigarettes  . Smokeless tobacco: Never Used     Comment: last attempt to quit 04/12/16, down to 6 cigs daily   . Alcohol use No  . Drug use: No  . Sexual activity: Yes   Other Topics Concern  . Not on file   Social History Narrative   Lives alone in [place].    Pertinent  Health Maintenance Due  Topic Date Due  . FOOT EXAM  07/12/1964  . OPHTHALMOLOGY EXAM  07/12/1964  . COLONOSCOPY  07/12/2004  . HEMOGLOBIN A1C  05/19/2009  . INFLUENZA VACCINE  09/21/2016    Medications: Allergies as of 11/02/2016      Reactions   Rocephin [ceftriaxone Sodium In Dextrose] Anaphylaxis   Ampicillin    Lipitor [atorvastatin]    Prochlorperazine Edisylate Other (See Comments)   Childhood Reaction.   Toradol [ketorolac Tromethamine] Nausea And Vomiting   Pt states he is not allergic to ibuprofen   Tramadol Nausea And Vomiting      Medication List       Accurate as of 11/02/16 11:59 AM. Always use your most  recent med list.          albuterol 108 (90 Base) MCG/ACT inhaler Commonly known as:  PROVENTIL HFA;VENTOLIN HFA Inhale 2 puffs into the lungs every 6 (six) hours as needed for wheezing.   ALPRAZolam 0.25 MG tablet Commonly known as:  XANAX Take 0.25 mg by mouth. Take one tablet nightly as needed for sleep for up to 20 days. Stop date 11/08/16   amLODipine 10 MG tablet Commonly known as:  NORVASC Take 10 mg by mouth daily.   aspirin EC 81 MG tablet Take 81 mg by mouth daily.   cyclobenzaprine 5 MG tablet Commonly known as:  FLEXERIL Take 5 mg by mouth. Take one tablet three  times a day as needed for muscle spams   Fluticasone-Salmeterol 250-50 MCG/DOSE Aepb Commonly known as:  ADVAIR Inhale 1 puff into the lungs 2 (two) times daily.   gabapentin 300 MG capsule Commonly known as:  NEURONTIN Take 300 mg by mouth 3 (three) times daily.   ipratropium-albuterol 0.5-2.5 (3) MG/3ML Soln Commonly known as:  DUONEB Take 3 mLs by nebulization. Inhale 3 ml by nebulization every four hours as needed   levothyroxine 25 MCG tablet Commonly known as:  SYNTHROID, LEVOTHROID Take 25 mcg by mouth daily at 6 (six) AM.   lisinopril 5 MG tablet Commonly known as:  PRINIVIL,ZESTRIL Take 5 mg by mouth daily.   metFORMIN 500 MG tablet Commonly known as:  GLUCOPHAGE Take 500 mg by mouth 2 (two) times daily with a meal.   MULTI-VITAMINS Tabs Take 1 tablet by mouth daily.   nitroGLYCERIN 0.4 MG SL tablet Commonly known as:  NITROSTAT Place 0.4 mg under the tongue every 5 (five) minutes as needed for chest pain.   omeprazole 20 MG capsule Commonly known as:  PRILOSEC Take 20 mg by mouth 2 (two) times daily.   Oxycodone HCl 10 MG Tabs Take 10 mg by mouth. Take one tablet every 4 hours as needed for pain   polyethylene glycol packet Commonly known as:  MIRALAX / GLYCOLAX Take 17 g by mouth daily as needed for mild constipation.   QUEtiapine 50 MG tablet Commonly known as:   SEROQUEL Take 50 mg by mouth. Take one tablet three times a day   temazepam 30 MG capsule Commonly known as:  RESTORIL Take 1 capsule (30 mg total) by mouth at bedtime.   venlafaxine 75 MG tablet Commonly known as:  EFFEXOR Take 75 mg by mouth daily.            Discharge Care Instructions        Start     Ordered   11/02/16 0000  CBC and differential    Comments:  This external order was created through the Results Console.    11/02/16 0949   11/02/16 5176  Basic metabolic panel    Comments:  This external order was created through the Results Console.    11/02/16 0949       Vitals:   11/02/16 0931  BP: 115/69  Pulse: 83  Resp: 18  Temp: 97.7 F (36.5 C)  SpO2: 91%  Weight: 266 lb (120.7 kg)  Height: 6' (1.829 m)   Body mass index is 36.08 kg/m.  Physical Exam  GENERAL APPEARANCE: Alert, conversant. No acute distress.  HEENT: Unremarkable. RESPIRATORY: Breathing is even, unlabored. Lung sounds are clear   CARDIOVASCULAR: Heart RRR no murmurs, rubs or gallops. 1+ peripheral edema., esp feet  GASTROINTESTINAL: Abdomen is soft, non-tender, not distended w/ normal bowel sounds.  NEUROLOGIC: Cranial nerves 2-12 grossly intact. Moves all extremities   Labs reviewed: Basic Metabolic Panel:  Recent Labs  11/06/15 0420 11/09/15 0523  10/10/16 1118 10/20/16 10/25/16  NA 135 134*  < > 135 138 140  K 4.2 3.8  < > 4.2 4.8 4.1  CL 105 100*  --  98*  --   --   CO2 21* 22  --  25  --   --   GLUCOSE 112* 117*  --  101*  --   --   BUN 13 8  < > 9 16 9   CREATININE 0.81 0.73  < > 0.72 0.8 0.6  CALCIUM 8.9 9.3  --  9.3  --   --   < > = values in this interval not displayed. No results found for: [facility] Liver Function Tests:  Recent Labs  06/20/16 08/04/16 10/10/16 1118  AST 12* 13* 21  ALT 9* 12 15*  ALKPHOS 86 84 92  BILITOT  --   --  0.5  PROT  --   --  7.2  ALBUMIN  --   --  4.3    Recent Labs  10/10/16 1118  LIPASE 18   No results for  input(s): AMMONIA in the last 8760 hours. CBC:  Recent Labs  11/06/15 0420 11/09/15 0523  10/10/16 1118 10/17/16 10/25/16  WBC 13.4* 10.9*  < > 10.0 7.6 12.6  NEUTROABS  --   --   --  7.4  --   --   HGB 12.7* 14.3  < > 13.3 14.0 12.0*  HCT 38.9* 42.9  < > 37.9* 40* 36*  MCV 86.4 85.5  --  84.4  --   --   PLT 187 197  < > 228 210 148*  < > = values in this interval not displayed. Lipid  Recent Labs  02/23/16  CHOL 133  HDL 34*  LDLCALC 67  TRIG 159   Cardiac Enzymes:  Recent Labs  11/05/15 0530 11/05/15 0830 10/10/16 1118  CKTOTAL  --   --  71  TROPONINI <0.03 <0.03  --    BNP:  Recent Labs  11/05/15 0530 11/09/15 0523  BNP 6.0 7.3   CBG: No results for input(s): GLUCAP in the last 8760 hours.  Procedures and Imaging Studies During Stay: Dg Chest 2 View  Result Date: 10/10/2016 CLINICAL DATA:  Patient with cough and abdominal pain. EXAM: CHEST  2 VIEW COMPARISON:  Chest radiograph 09/04/2016. FINDINGS: Normal cardiac and mediastinal contours. No consolidative pulmonary opacities. No pleural effusion or pneumothorax. Thoracic spine degenerative changes. IMPRESSION: No acute cardiopulmonary process. Probable basilar atelectasis or scarring. Electronically Signed   By: Lovey Newcomer M.D.   On: 10/10/2016 11:48   Ct Abdomen Pelvis W Contrast  Result Date: 10/10/2016 CLINICAL DATA:  All over abdominal pain and tenderness since Thursday. Prior appendectomy. EXAM: CT ABDOMEN AND PELVIS WITH CONTRAST TECHNIQUE: Multidetector CT imaging of the abdomen and pelvis was performed using the standard protocol following bolus administration of intravenous contrast. CONTRAST:  173mL ISOVUE-300 IOPAMIDOL (ISOVUE-300) INJECTION 61% COMPARISON:  09/04/2016. FINDINGS: Lower chest: 7 mm left lower lobe pulmonary nodule is unchanged comparing back to a scan from 11/03/2013, consistent with benign etiology. Hepatobiliary: The liver shows diffusely decreased attenuation suggesting steatosis.  Gallbladder surgically absent. No intrahepatic or extrahepatic biliary dilation. Pancreas: No focal mass lesion. No dilatation of the main duct. No intraparenchymal cyst. No peripancreatic edema. Spleen: No splenomegaly. No focal mass lesion. Adrenals/Urinary Tract: No adrenal nodule or mass. 7 mm low-density lesion interpolar left kidney similar to 715 2018 a second 6 mm low-density lesion interpolar left kidney is also stable. Right kidney unremarkable. No evidence for hydroureter. Bladder is moderately distended. Stomach/Bowel: Stomach is nondistended. No gastric wall thickening. No evidence of outlet obstruction. Duodenum is normally positioned as is the ligament of Treitz. No small bowel wall thickening. No small bowel dilatation. The terminal ileum is normal. Nonvisualization of the appendix is consistent with the reported history of appendectomy. Diverticuli are seen scattered along the entire length of the colon without CT findings of diverticulitis. Vascular/Lymphatic: There is abdominal aortic atherosclerosis without aneurysm. There is no gastrohepatic or hepatoduodenal ligament lymphadenopathy. No  intraperitoneal or retroperitoneal lymphadenopathy. No pelvic sidewall lymphadenopathy. Reproductive: The prostate gland and seminal vesicles have normal imaging features. Other: No intraperitoneal free fluid. Musculoskeletal: Degenerative changes noted in the right acetabulum. Bone windows reveal no worrisome lytic or sclerotic osseous lesions. Degenerative changes noted L4-5. IMPRESSION: 1. No acute findings in the abdomen or pelvis. Specifically, no findings to explain the patient's history of pain and tenderness. 2. Stable 7 mm left lower lobe pulmonary nodule comparing back to 11/03/2013. This is compatible with benign etiology. 3.  Aortic Atherosclerois (ICD10-170.0) 4. Hepatic steatosis. 5. Colonic diverticulosis without diverticulitis. Electronically Signed   By: Misty Stanley M.D.   On: 10/10/2016 13:41     Assessment/Plan:   Hip pain, acute, right  Chronic pain disorder  Community acquired pneumonia of right lower lobe of lung ([place])  COPD exacerbation ([place])  Hypertensive heart disease without heart failure  Coronary artery disease of native artery of native heart with stable angina pectoris (HCC)  Type 2 diabetes mellitus with complication, without long-term current use of insulin (HCC)  Diabetic polyneuropathy associated with type 2 diabetes mellitus ([place])  Hyperlipidemia associated with type 2 diabetes mellitus ([place])  Bipolar 1 disorder ([place])  Fall, subsequent encounter   Patient is being discharged with the following home health services:  OT/PT/nursing  Patient is being discharged with the following durable medical equipment:  O2  Patient has been advised to f/u with their PCP in 1-2 weeks to bring them up to date on their rehab stay.  Social services at facility was responsible for arranging this appointment.  Pt was provided with a 30 day supply of prescriptions for medications and refills must be obtained from their PCP.  For controlled substances, a more limited supply may be provided adequate until PCP appointment only.  Medications have been reconciled. Norvasc was DC'd due to persistent lower extremity edema and lisinopril was increased from 5 mg to 10 mg by mouth daily.   Time spent greater than 30 minutes;> 50% of time with patient was spent reviewing records, labs, tests and studies, counseling and developing plan of care  Noah Delaine. Sheppard Coil, MD

## 2016-12-03 ENCOUNTER — Encounter: Payer: Self-pay | Admitting: Internal Medicine

## 2016-12-11 ENCOUNTER — Emergency Department (HOSPITAL_BASED_OUTPATIENT_CLINIC_OR_DEPARTMENT_OTHER)
Admission: EM | Admit: 2016-12-11 | Discharge: 2016-12-11 | Disposition: A | Discharge status: Home / Self Care | Payer: Medicare HMO | Attending: Emergency Medicine | Admitting: Emergency Medicine

## 2016-12-11 ENCOUNTER — Emergency Department (HOSPITAL_BASED_OUTPATIENT_CLINIC_OR_DEPARTMENT_OTHER): Payer: Medicare HMO

## 2016-12-11 ENCOUNTER — Encounter (HOSPITAL_BASED_OUTPATIENT_CLINIC_OR_DEPARTMENT_OTHER): Payer: Self-pay | Admitting: Emergency Medicine

## 2016-12-11 DIAGNOSIS — E1142 Type 2 diabetes mellitus with diabetic polyneuropathy: Secondary | ICD-10-CM | POA: Insufficient documentation

## 2016-12-11 DIAGNOSIS — J441 Chronic obstructive pulmonary disease with (acute) exacerbation: Secondary | ICD-10-CM | POA: Insufficient documentation

## 2016-12-11 DIAGNOSIS — F17210 Nicotine dependence, cigarettes, uncomplicated: Secondary | ICD-10-CM | POA: Insufficient documentation

## 2016-12-11 DIAGNOSIS — Z7984 Long term (current) use of oral hypoglycemic drugs: Secondary | ICD-10-CM | POA: Diagnosis not present

## 2016-12-11 DIAGNOSIS — R112 Nausea with vomiting, unspecified: Secondary | ICD-10-CM | POA: Insufficient documentation

## 2016-12-11 DIAGNOSIS — Z79899 Other long term (current) drug therapy: Secondary | ICD-10-CM | POA: Insufficient documentation

## 2016-12-11 DIAGNOSIS — Z955 Presence of coronary angioplasty implant and graft: Secondary | ICD-10-CM | POA: Diagnosis not present

## 2016-12-11 DIAGNOSIS — E039 Hypothyroidism, unspecified: Secondary | ICD-10-CM | POA: Insufficient documentation

## 2016-12-11 DIAGNOSIS — I509 Heart failure, unspecified: Secondary | ICD-10-CM | POA: Insufficient documentation

## 2016-12-11 DIAGNOSIS — Z7982 Long term (current) use of aspirin: Secondary | ICD-10-CM | POA: Insufficient documentation

## 2016-12-11 DIAGNOSIS — I110 Hypertensive heart disease with heart failure: Secondary | ICD-10-CM | POA: Diagnosis not present

## 2016-12-11 DIAGNOSIS — B9789 Other viral agents as the cause of diseases classified elsewhere: Secondary | ICD-10-CM | POA: Insufficient documentation

## 2016-12-11 DIAGNOSIS — I2510 Atherosclerotic heart disease of native coronary artery without angina pectoris: Secondary | ICD-10-CM | POA: Diagnosis not present

## 2016-12-11 DIAGNOSIS — J069 Acute upper respiratory infection, unspecified: Secondary | ICD-10-CM | POA: Insufficient documentation

## 2016-12-11 DIAGNOSIS — R1110 Vomiting, unspecified: Secondary | ICD-10-CM | POA: Diagnosis present

## 2016-12-11 LAB — CBC WITH DIFFERENTIAL/PLATELET
BASOS ABS: 0.1 10*3/uL (ref 0.0–0.1)
Basophils Relative: 1 %
EOS PCT: 1 %
Eosinophils Absolute: 0.1 10*3/uL (ref 0.0–0.7)
HEMATOCRIT: 45.6 % (ref 39.0–52.0)
HEMOGLOBIN: 15.6 g/dL (ref 13.0–17.0)
LYMPHS ABS: 3.1 10*3/uL (ref 0.7–4.0)
LYMPHS PCT: 25 %
MCH: 29.2 pg (ref 26.0–34.0)
MCHC: 34.2 g/dL (ref 30.0–36.0)
MCV: 85.2 fL (ref 78.0–100.0)
Monocytes Absolute: 0.8 10*3/uL (ref 0.1–1.0)
Monocytes Relative: 6 %
NEUTROS ABS: 8.6 10*3/uL — AB (ref 1.7–7.7)
NEUTROS PCT: 68 %
Platelets: 239 10*3/uL (ref 150–400)
RBC: 5.35 MIL/uL (ref 4.22–5.81)
RDW: 18.7 % — AB (ref 11.5–15.5)
WBC: 12.7 10*3/uL — AB (ref 4.0–10.5)

## 2016-12-11 LAB — COMPREHENSIVE METABOLIC PANEL
ALBUMIN: 5 g/dL (ref 3.5–5.0)
ALT: 13 U/L — ABNORMAL LOW (ref 17–63)
ANION GAP: 11 (ref 5–15)
AST: 17 U/L (ref 15–41)
Alkaline Phosphatase: 97 U/L (ref 38–126)
BILIRUBIN TOTAL: 1 mg/dL (ref 0.3–1.2)
BUN: 6 mg/dL (ref 6–20)
CALCIUM: 9.6 mg/dL (ref 8.9–10.3)
CO2: 27 mmol/L (ref 22–32)
Chloride: 96 mmol/L — ABNORMAL LOW (ref 101–111)
Creatinine, Ser: 0.72 mg/dL (ref 0.61–1.24)
GFR calc non Af Amer: >60 mL/min (ref >60)
GLUCOSE: 155 mg/dL — AB (ref 65–99)
POTASSIUM: 3.2 mmol/L — AB (ref 3.5–5.1)
Sodium: 134 mmol/L — ABNORMAL LOW (ref 135–145)
TOTAL PROTEIN: 8.1 g/dL (ref 6.5–8.1)

## 2016-12-11 LAB — I-STAT CG4 LACTIC ACID, ED: Lactic Acid, Venous: 2.26 mmol/L (ref 0.5–1.9)

## 2016-12-11 LAB — LIPASE, BLOOD: LIPASE: 14 U/L (ref 11–51)

## 2016-12-11 MED ORDER — QUETIAPINE FUMARATE 50 MG PO TABS
50.0000 mg | ORAL_TABLET | Freq: Once | ORAL | Status: DC
  Filled 2016-12-11: qty 1

## 2016-12-11 MED ORDER — ALBUTEROL SULFATE HFA 108 (90 BASE) MCG/ACT IN AERS: 2.0000 | INHALATION_SPRAY | RESPIRATORY_TRACT | 0 refills | Status: DC | PRN

## 2016-12-11 MED ORDER — IPRATROPIUM-ALBUTEROL 0.5-2.5 (3) MG/3ML IN SOLN
3.0000 mL | Freq: Once | RESPIRATORY_TRACT | Status: AC
  Administered 2016-12-11: 3 mL via RESPIRATORY_TRACT
  Filled 2016-12-11: qty 3

## 2016-12-11 MED ORDER — OXYCODONE-ACETAMINOPHEN 5-325 MG PO TABS
2.0000 | ORAL_TABLET | Freq: Once | ORAL | Status: AC
  Administered 2016-12-11: 2 via ORAL
  Filled 2016-12-11: qty 2

## 2016-12-11 MED ORDER — ACETAMINOPHEN 500 MG PO TABS
1000.0000 mg | ORAL_TABLET | Freq: Once | ORAL | Status: AC
  Administered 2016-12-11: 1000 mg via ORAL
  Filled 2016-12-11: qty 2

## 2016-12-11 MED ORDER — OSELTAMIVIR PHOSPHATE 75 MG PO CAPS
75.0000 mg | ORAL_CAPSULE | Freq: Once | ORAL | Status: AC
  Administered 2016-12-11: 75 mg via ORAL
  Filled 2016-12-11: qty 1

## 2016-12-11 MED ORDER — POTASSIUM CHLORIDE CRYS ER 20 MEQ PO TBCR
40.0000 meq | EXTENDED_RELEASE_TABLET | Freq: Once | ORAL | Status: AC
  Administered 2016-12-11: 40 meq via ORAL
  Filled 2016-12-11: qty 2

## 2016-12-11 MED ORDER — SODIUM CHLORIDE 0.9 % IV BOLUS (SEPSIS)
500.0000 mL | Freq: Once | INTRAVENOUS | Status: AC
  Administered 2016-12-11: 500 mL via INTRAVENOUS

## 2016-12-11 MED ORDER — LISINOPRIL 10 MG PO TABS
10.0000 mg | ORAL_TABLET | Freq: Once | ORAL | Status: AC
  Administered 2016-12-11: 10 mg via ORAL
  Filled 2016-12-11: qty 1

## 2016-12-11 MED ORDER — METOCLOPRAMIDE HCL 5 MG/ML IJ SOLN
10.0000 mg | INTRAMUSCULAR | Status: AC
  Administered 2016-12-11: 10 mg via INTRAVENOUS
  Filled 2016-12-11: qty 2

## 2016-12-11 MED ORDER — METOCLOPRAMIDE HCL 10 MG PO TABS: 10.0000 mg | ORAL_TABLET | Freq: Three times a day (TID) | ORAL | 0 refills | Status: DC | PRN

## 2016-12-11 MED ORDER — SODIUM CHLORIDE 0.9 % IV BOLUS (SEPSIS)
1000.0000 mL | Freq: Once | INTRAVENOUS | Status: AC
  Administered 2016-12-11: 1000 mL via INTRAVENOUS

## 2016-12-11 MED ORDER — PREDNISONE 20 MG PO TABS: 40.0000 mg | ORAL_TABLET | Freq: Every day | ORAL | 0 refills | Status: DC

## 2016-12-11 MED ORDER — DIPHENHYDRAMINE HCL 50 MG/ML IJ SOLN
12.5000 mg | Freq: Once | INTRAMUSCULAR | Status: AC
  Administered 2016-12-11: 12.5 mg via INTRAVENOUS
  Filled 2016-12-11: qty 1

## 2016-12-11 MED ORDER — GABAPENTIN 300 MG PO CAPS
300.0000 mg | ORAL_CAPSULE | Freq: Once | ORAL | Status: AC
  Administered 2016-12-11: 300 mg via ORAL
  Filled 2016-12-11: qty 1

## 2016-12-11 MED ORDER — METHYLPREDNISOLONE SODIUM SUCC 125 MG IJ SOLR
125.0000 mg | Freq: Once | INTRAMUSCULAR | Status: AC
  Administered 2016-12-11: 125 mg via INTRAVENOUS
  Filled 2016-12-11: qty 2

## 2016-12-11 MED ORDER — VENLAFAXINE HCL 75 MG PO TABS
75.0000 mg | ORAL_TABLET | Freq: Once | ORAL | Status: DC
  Filled 2016-12-11: qty 1

## 2016-12-11 MED ORDER — CYCLOBENZAPRINE HCL 5 MG PO TABS
5.0000 mg | ORAL_TABLET | Freq: Once | ORAL | Status: AC
  Administered 2016-12-11: 5 mg via ORAL
  Filled 2016-12-11: qty 1

## 2016-12-11 MED ORDER — OSELTAMIVIR PHOSPHATE 75 MG PO CAPS: 75.0000 mg | ORAL_CAPSULE | Freq: Two times a day (BID) | ORAL | 0 refills | Status: DC

## 2016-12-11 MED ORDER — PROMETHAZINE HCL 25 MG/ML IJ SOLN
12.5000 mg | INTRAMUSCULAR | Status: AC
  Administered 2016-12-11: 12.5 mg via INTRAVENOUS
  Filled 2016-12-11: qty 1

## 2016-12-11 NOTE — ED Provider Notes (Signed)
[facility] EMERGENCY DEPARTMENT Provider Note   CSN: [id number] Arrival date & time: 12/11/16  1137     History   Chief Complaint Chief Complaint  Patient presents with  . Emesis    HPI [name] is a 61 y.o. male.  61yo M w/ PMH including COPD, CHF EF 50-55%, bipolar d/o, CAD who p/w cough, body aches, and vomiting. 2 days ago the patient developed a cough associated with nasal congestion, sinus pressure and sinus headache, generalized body aches, and generalized body pain. He began having nausea and vomiting and has not been able to keep anything down today. He reports one sick contact. He reports fevers at home. He has not taken any medications today prior to arrival. He does endorse some shortness of breath and wheezing. No focal chest pain. No abdominal pain. No rash.   The history is provided by the patient.  Emesis      Past Medical History:  Diagnosis Date  . Anginal pain ([place])   . Anxiety   . Arthritis    "right hip; herniated L4-5" (11/05/2015)  . Asthma   . Bipolar 1 disorder ([place])   . CHF (congestive heart failure) ([place])   . Chronic bronchitis ([place])   . Chronic lower back pain   . Chronic pain disorder   . COPD (chronic obstructive pulmonary disease) ([place])   . Coronary artery disease   . Coronary atherosclerosis 12/05/2008   Qualifier: Diagnosis of  By: Sidney Ace    . DEEP VENOUS THROMBOPHLEBITIS, HX OF 12/05/2008   Qualifier: Diagnosis of  By: Sidney Ace    . DEPRESSION, MAJOR 12/05/2008   Qualifier: Diagnosis of  By: Sidney Ace    . Diabetes mellitus type 2 with complications ([place]) 08/20/5282  . Diverticulitis large intestine   . DVT (deep venous thrombosis) ([place]) 1991   "left calf; after 1st knee scope"  . EROSIVE GASTRITIS 12/05/2008   Qualifier: Diagnosis of  By: Sidney Ace    . Fungal esophagitis   . GERD 12/05/2008   Qualifier: Diagnosis of  By: Sidney Ace    . GERD (gastroesophageal reflux disease)   .  Heart attack ([place]) 2010   BMS x 2 RCA  . High cholesterol   . Hyperlipidemia   . Hypertension   . Hypothyroidism   . MRSA (methicillin resistant staph aureus) culture positive   . Myocardial infarction ([place])   . MYOCARDIAL INFARCTION, HX OF 12/05/2008   Qualifier: Diagnosis of  By: Sidney Ace    . Obesity 12/05/2008   Qualifier: Diagnosis of  By: Sidney Ace    . Pneumonia    "several times"  . PULMONARY NODULE, LEFT LOWER LOBE 12/05/2008   Qualifier: Diagnosis of  By: Sidney Ace    . S/P angioplasty with stent 11/05/15 PCI DES to [facility]  11/06/2015  . Schizo affective schizophrenia ([place]) 2002  . Status post cholecystectomy 09/25/2014    Patient Active Problem List   Diagnosis Date Noted  . Fall 10/30/2016  . Community acquired pneumonia 10/30/2016  . Diabetic polyneuropathy ([place]) 10/30/2016  . Hyperlipidemia associated with type 2 diabetes mellitus ([place]) 10/30/2016  . SOB (shortness of breath) 08/22/2016  . Hip pain, acute, right 07/06/2016  . Arthritis 07/06/2016  . Chronic lower back pain 07/06/2016  . Hypothyroidism 07/06/2016  . Diabetes mellitus type 2 with complications ([place]) 13/24/4010  . Chronic pain disorder   . Diverticulitis large intestine   . Hyperlipidemia   . Myocardial infarction ([place])   .  S/P angioplasty with stent 11/05/15 PCI DES to mLAD  11/06/2015  . Ischemic chest pain   . Status post cholecystectomy 09/25/2014  . Chest pain 09/24/2014  . COPD exacerbation ([place]) 09/24/2014  . Chest pain of uncertain etiology   . Hyperlipidemia LDL goal <70 12/05/2008  . Obesity 12/05/2008  . DEPRESSION, MAJOR 12/05/2008  . Bipolar 1 disorder ([place]) 12/05/2008  . TOBACCO ABUSE 12/05/2008  . Hypertensive heart disease 12/05/2008  . MYOCARDIAL INFARCTION, HX OF 12/05/2008  . Coronary artery disease 12/05/2008  . COPD (chronic obstructive pulmonary disease) ([place]) 12/05/2008  . PULMONARY NODULE, LEFT LOWER LOBE 12/05/2008  . GERD 12/05/2008  . EROSIVE  GASTRITIS 12/05/2008  . DEEP VENOUS THROMBOPHLEBITIS, HX OF 12/05/2008    Past Surgical History:  Procedure Laterality Date  . APPENDECTOMY    . CARDIAC CATHETERIZATION N/A 11/05/2015   Procedure: Left Heart Cath and Coronary Angiography;  Surgeon: Nelva Bush, MD;  Location: [facility] CV LAB;  Service: Cardiovascular;  Laterality: N/A;  . CARDIAC CATHETERIZATION N/A 11/05/2015   Procedure: Coronary Stent Intervention;  Surgeon: Nelva Bush, MD;  Location: [facility];  Service: Cardiovascular;  Laterality: N/A;  . CARDIAC CATHETERIZATION N/A 11/05/2015   Procedure: Intravascular Pressure Wire/FFR Study;  Surgeon: Nelva Bush, MD;  Location: [facility];  Service: Cardiovascular;  Laterality: N/A;  . CORONARY ANGIOPLASTY WITH STENT PLACEMENT  2010   90% RCA s/p BMS x 2, 50% LAD, 30% CFX, EF 45-50%  . CORONARY ANGIOPLASTY WITH STENT PLACEMENT  11/05/2015   "1 today; makes a total of 4 stents" (11/05/2015)  . INGUINAL HERNIA REPAIR Right   . KNEE ARTHROSCOPY Left X 3  . LAPAROSCOPIC CHOLECYSTECTOMY    . TONSILLECTOMY AND ADENOIDECTOMY    . UPPER GI ENDOSCOPY  05/24/2016   UGI ENDO INCLUDE ESOPHAGUS, STOMACH & DUODENUM &/OR JEJUNUM; DX W/WO COLLECTION SPECIMEN BY [place] OR [place]; SURGEON :ALBERT San Morelle, MD LOCATION; [facility]    Prior to Admission medications   Medication Sig Start Date End Date Taking? Authorizing Provider  albuterol (PROVENTIL HFA;VENTOLIN HFA) 108 (90 Base) MCG/ACT inhaler Inhale 2 puffs into the lungs every 4 (four) hours as needed for wheezing or shortness of breath. 12/11/16   Little, Wenda Overland, MD  ALPRAZolam Duanne Moron) 0.25 MG tablet Take 0.25 mg by mouth. Take one tablet nightly as needed for sleep for up to 20 days. Stop date 11/08/16    [provider]  aspirin EC 81 MG tablet Take 81 mg by mouth daily.     [provider]  cyclobenzaprine (FLEXERIL) 5 MG tablet Take 5 mg by  mouth. Take one tablet three times a day as needed for muscle spams    [provider]  Fluticasone-Salmeterol (ADVAIR) 250-50 MCG/DOSE AEPB Inhale 1 puff into the lungs 2 (two) times daily.     [provider]  gabapentin (NEURONTIN) 300 MG capsule Take 300 mg by mouth 3 (three) times daily.    [provider]  ipratropium-albuterol (DUONEB) 0.5-2.5 (3) MG/3ML SOLN Take 3 mLs by nebulization. Inhale 3 ml by nebulization every four hours as needed    [provider]  levothyroxine (SYNTHROID, LEVOTHROID) 25 MCG tablet Take 25 mcg by mouth daily at 6 (six) AM.     [provider]  lisinopril (PRINIVIL,ZESTRIL) 10 MG tablet Take 10 mg by mouth daily.    [provider]  metFORMIN (GLUCOPHAGE) 500 MG tablet Take 500 mg  by mouth 2 (two) times daily with a meal.     [provider]  metoCLOPramide (REGLAN) 10 MG tablet Take 1 tablet (10 mg total) by mouth 3 (three) times daily as needed for nausea (headache / nausea). 12/11/16   Little, Wenda Overland, MD  Multiple Vitamin (MULTI-VITAMINS) TABS Take 1 tablet by mouth daily.    [provider]  nitroGLYCERIN (NITROSTAT) 0.4 MG SL tablet Place 0.4 mg under the tongue every 5 (five) minutes as needed for chest pain.     [provider]  omeprazole (PRILOSEC) 20 MG capsule Take 20 mg by mouth 2 (two) times daily.     [provider]  oseltamivir (TAMIFLU) 75 MG capsule Take 1 capsule (75 mg total) by mouth every 12 (twelve) hours. 12/11/16   Little, Wenda Overland, MD  Oxycodone HCl 10 MG TABS Take 10 mg by mouth. Take one tablet every 4 hours as needed for pain    [provider]  polyethylene glycol (MIRALAX / GLYCOLAX) packet Take 17 g by mouth daily as needed for mild constipation.     [provider]  predniSONE (DELTASONE) 20 MG tablet Take 2 tablets (40 mg total) by mouth daily. 12/11/16   Little, Wenda Overland, MD  QUEtiapine (SEROQUEL) 50 MG  tablet Take 50 mg by mouth. Take one tablet three times a day    [provider]  temazepam (RESTORIL) 30 MG capsule Take 1 capsule (30 mg total) by mouth at bedtime. 08/22/16   Lauree Chandler, NP  venlafaxine (EFFEXOR) 75 MG tablet Take 75 mg by mouth daily.     [provider]    Family History Family History  Problem Relation Age of Onset  . Heart Problems Mother        4  . Heart disease Father        58    Social History Social History  Substance Use Topics  . Smoking status: Current Every Day Smoker    Years: 42.00    Types: Cigarettes  . Smokeless tobacco: Never Used     Comment: last attempt to quit 04/12/16, down to 6 cigs daily   . Alcohol use No     Allergies   Rocephin [ceftriaxone sodium in dextrose]; Zofran [ondansetron hcl]; Ampicillin; Lipitor [atorvastatin]; Prochlorperazine edisylate; Toradol [ketorolac tromethamine]; and Tramadol   Review of Systems Review of Systems  Gastrointestinal: Positive for vomiting.   All other systems reviewed and are negative except that which was mentioned in HPI   Physical Exam Updated Vital Signs BP (!) 146/82   Pulse 72   Temp 98.3 F (36.8 C) (Oral)   Resp 18   Ht 6' (1.829 m)   Wt 108.9 kg (240 lb)   SpO2 100%   BMI 32.55 kg/m   Physical Exam  Constitutional: He is oriented to person, place, and time. He appears well-developed and well-nourished.  Uncomfortable, ill appearing but non-toxic  HENT:  Head: Normocephalic and atraumatic.  dry mucous membranes  Eyes: Pupils are equal, round, and reactive to light. Conjunctivae are normal.  Neck: Neck supple.  Cardiovascular: Normal rate, regular rhythm and normal heart sounds.   No murmur heard. Pulmonary/Chest: Effort normal. No respiratory distress. He has wheezes.  Diminished in bilateral bases, end expiratory wheezes, frequent cough  Abdominal: Soft. Bowel sounds are normal. He exhibits no distension. There is no tenderness.    Musculoskeletal: He exhibits no edema.  Neurological: He is alert and oriented to person, place, and  time.  Fluent speech  Skin: Skin is warm and dry.  Psychiatric: He has a normal mood and affect. Judgment normal.  Nursing note and vitals reviewed.    ED Treatments / Results  Labs (all labs ordered are listed, but only abnormal results are displayed) Labs Reviewed  CBC WITH DIFFERENTIAL/PLATELET - Abnormal; Notable for the following:       Result Value   WBC 12.7 (*)    RDW 18.7 (*)    Neutro Abs 8.6 (*)    All other components within normal limits  COMPREHENSIVE METABOLIC PANEL - Abnormal; Notable for the following:    Sodium 134 (*)    Potassium 3.2 (*)    Chloride 96 (*)    Glucose, Bld 155 (*)    ALT 13 (*)    All other components within normal limits  I-STAT CG4 LACTIC ACID, ED - Abnormal; Notable for the following:    Lactic Acid, Venous 2.26 (*)    All other components within normal limits  LIPASE, BLOOD    EKG  EKG Interpretation None       Radiology Dg Chest 2 View  Result Date: 12/11/2016 CLINICAL DATA:  Flu like symptoms.  Cough and fever EXAM: CHEST  2 VIEW COMPARISON:  10/17/2016 FINDINGS: The heart size and mediastinal contours are within normal limits. Both lungs are clear. The visualized skeletal structures are unremarkable. IMPRESSION: No active cardiopulmonary disease. Electronically Signed   By: Franchot Gallo M.D.   On: 12/11/2016 13:46    Procedures Procedures (including critical care time)  Medications Ordered in ED Medications  sodium chloride 0.9 % bolus 500 mL (not administered)  metoCLOPramide (REGLAN) injection 10 mg (10 mg Intravenous Given 12/11/16 1221)  ipratropium-albuterol (DUONEB) 0.5-2.5 (3) MG/3ML nebulizer solution 3 mL (3 mLs Nebulization Given 12/11/16 1245)  methylPREDNISolone sodium succinate (SOLU-MEDROL) 125 mg/2 mL injection 125 mg (125 mg Intravenous Given 12/11/16 1241)  acetaminophen (TYLENOL) tablet 1,000 mg  (1,000 mg Oral Given 12/11/16 1241)  oseltamivir (TAMIFLU) capsule 75 mg (75 mg Oral Given 12/11/16 1241)  sodium chloride 0.9 % bolus 1,000 mL (0 mLs Intravenous Stopped 12/11/16 1341)  promethazine (PHENERGAN) injection 12.5 mg (12.5 mg Intravenous Given 12/11/16 1446)  diphenhydrAMINE (BENADRYL) injection 12.5 mg (12.5 mg Intravenous Given 12/11/16 1448)  oxyCODONE-acetaminophen (PERCOCET/ROXICET) 5-325 MG per tablet 2 tablet (2 tablets Oral Given 12/11/16 1449)  cyclobenzaprine (FLEXERIL) tablet 5 mg (5 mg Oral Given 12/11/16 1449)  gabapentin (NEURONTIN) capsule 300 mg (300 mg Oral Given 12/11/16 1449)  lisinopril (PRINIVIL,ZESTRIL) tablet 10 mg (10 mg Oral Given 12/11/16 1449)     Initial Impression / Assessment and Plan / ED Course  I have reviewed the triage vital signs and the nursing notes.  Pertinent labs & imaging results that were available during my care of the patient were reviewed by me and considered in my medical decision making (see chart for details).     PT w/ 2d N/V associated with cough, congestion and URI sx. He was uncomfortable but nontoxic on exam with reassuring vital signs, afebrile and normal O2 saturation on room air. He did have some expiratory wheezes bilaterally, given history of COPD I ordered Solu-Medrol as well as DuoNeb. Gave fluids, Reglan, and Tylenol. I had a long discussion regarding the risks and benefits of Tamiflu, as his symptoms are suggestive of flulike illness and he has comorbidities including structural lung disease. He voiced understanding and was willing to proceed with the medication.  The patient was a difficult blood  draw and therefore gave bolus of fluids prior to reattempt for lab work. His lactate after 1 L is 2.26. His vital signs have remained normal, I suspect lactic acidosis is related to dehydration. His lab work is overall reassuring including normal creatinine, normal LFTs and lipase, reassuring CBC. Chest x-ray negative acute. I  have given him another dose of anti-emetic for ongoing nausea as well as some of his home medications. I am signing out to the oncoming provider who will follow-up on the patient's clinical improvement after receiving above medications. If he is able to tolerate liquids with improvement in his nausea and vomiting, I anticipate he will be able to go home.   Final Clinical Impressions(s) / ED Diagnoses   Final diagnoses:  Viral URI with cough  COPD with acute exacerbation (HCC)  Non-intractable vomiting with nausea, unspecified vomiting type    New Prescriptions New Prescriptions   ALBUTEROL (PROVENTIL HFA;VENTOLIN HFA) 108 (90 BASE) MCG/ACT INHALER    Inhale 2 puffs into the lungs every 4 (four) hours as needed for wheezing or shortness of breath.   METOCLOPRAMIDE (REGLAN) 10 MG TABLET    Take 1 tablet (10 mg total) by mouth 3 (three) times daily as needed for nausea (headache / nausea).   OSELTAMIVIR (TAMIFLU) 75 MG CAPSULE    Take 1 capsule (75 mg total) by mouth every 12 (twelve) hours.   PREDNISONE (DELTASONE) 20 MG TABLET    Take 2 tablets (40 mg total) by mouth daily.     Little, Wenda Overland, MD 12/11/16 [id number]

## 2016-12-11 NOTE — ED Triage Notes (Signed)
Pt c/o NV since Fri; unable to keep anything down; c/o "pain all over."

## 2016-12-11 NOTE — ED Provider Notes (Signed)
I assumed care of this patient from Dr. Rex Kras at 1500.  Please see their note for further details of Hx, PE.  Briefly patient is a 61 y.o. male who presented with flu-like symptoms including emesis with decreased PO tolerance at home. Given antiemetics and IVF. Pending labs.   Labs with expected leukocytosis. Also noted low K+ which was repleted orally.     4:15 PM Tolerated oral intake.   The patient is safe for discharge with strict return precautions.   Disposition: Discharge  Condition: Good  I have discussed the results, Dx and Tx plan with the patient who expressed understanding and agree(s) with the plan. Discharge instructions discussed at great length. The patient was given strict return precautions who verbalized understanding of the instructions. No further questions at time of discharge.    New Prescriptions   ALBUTEROL (PROVENTIL HFA;VENTOLIN HFA) 108 (90 BASE) MCG/ACT INHALER    Inhale 2 puffs into the lungs every 4 (four) hours as needed for wheezing or shortness of breath.   METOCLOPRAMIDE (REGLAN) 10 MG TABLET    Take 1 tablet (10 mg total) by mouth 3 (three) times daily as needed for nausea (headache / nausea).   OSELTAMIVIR (TAMIFLU) 75 MG CAPSULE    Take 1 capsule (75 mg total) by mouth every 12 (twelve) hours.   PREDNISONE (DELTASONE) 20 MG TABLET    Take 2 tablets (40 mg total) by mouth daily.    Follow Up: Beckie Salts, MD [address] [facility] [address] [phone]  In 3 days        Fatima Blank, MD 12/11/16 1616

## 2016-12-11 NOTE — ED Notes (Signed)
Attempt x 2 by 2nd RN to obtain blood for labs; unable to obtain; EDP aware. Plan is to try again after NS bolus complete.

## 2017-01-19 LAB — HEPATIC FUNCTION PANEL
ALT: 73 — AB (ref 10–40)
AST: 34 (ref 14–40)
Alkaline Phosphatase: 88 (ref 25–125)
BILIRUBIN, TOTAL: 0.3

## 2017-01-21 LAB — BASIC METABOLIC PANEL
BUN: 16 (ref 4–21)
Creatinine: 0.7 (ref <=1.3)
Potassium: 3.3 — AB (ref 3.4–5.3)
Sodium: 138 (ref 137–147)

## 2017-01-21 LAB — CBC AND DIFFERENTIAL
HEMATOCRIT: 38 — AB (ref 41–53)
HEMOGLOBIN: 12.5 — AB (ref 13.5–17.5)
PLATELETS: 283 (ref 150–399)
WBC: 11.4

## 2017-01-23 ENCOUNTER — Encounter: Payer: Self-pay | Admitting: Internal Medicine

## 2017-01-23 ENCOUNTER — Non-Acute Institutional Stay (SKILLED_NURSING_FACILITY): Payer: Medicare HMO | Admitting: Internal Medicine

## 2017-01-23 DIAGNOSIS — E034 Atrophy of thyroid (acquired): Secondary | ICD-10-CM | POA: Diagnosis not present

## 2017-01-23 DIAGNOSIS — J9601 Acute respiratory failure with hypoxia: Secondary | ICD-10-CM | POA: Diagnosis not present

## 2017-01-23 DIAGNOSIS — F319 Bipolar disorder, unspecified: Secondary | ICD-10-CM | POA: Diagnosis not present

## 2017-01-23 DIAGNOSIS — G894 Chronic pain syndrome: Secondary | ICD-10-CM | POA: Diagnosis not present

## 2017-01-23 DIAGNOSIS — J441 Chronic obstructive pulmonary disease with (acute) exacerbation: Secondary | ICD-10-CM | POA: Diagnosis not present

## 2017-01-23 DIAGNOSIS — A419 Sepsis, unspecified organism: Secondary | ICD-10-CM | POA: Diagnosis not present

## 2017-01-23 DIAGNOSIS — J189 Pneumonia, unspecified organism: Secondary | ICD-10-CM

## 2017-01-23 DIAGNOSIS — E1169 Type 2 diabetes mellitus with other specified complication: Secondary | ICD-10-CM | POA: Diagnosis not present

## 2017-01-23 DIAGNOSIS — E785 Hyperlipidemia, unspecified: Secondary | ICD-10-CM

## 2017-01-23 DIAGNOSIS — I119 Hypertensive heart disease without heart failure: Secondary | ICD-10-CM | POA: Diagnosis not present

## 2017-01-23 DIAGNOSIS — I25118 Atherosclerotic heart disease of native coronary artery with other forms of angina pectoris: Secondary | ICD-10-CM | POA: Diagnosis not present

## 2017-01-23 NOTE — Progress Notes (Signed)
: Provider:  Noah Delaine. Sheppard Coil, MD Location:  [place] Room Number: 818 Place of Service:  SNF [phone])  PCP: Beckie Salts, MD Patient Care Team: Beckie Salts, MD as PCP - General (Internal Medicine)  Extended Emergency Contact Information Primary Emergency Contact: [place] Mobile Phone: [phone] Relation: Daughter     Allergies: Rocephin [ceftriaxone sodium in dextrose]; Zofran [ondansetron hcl]; Ampicillin; Lipitor [atorvastatin]; Prochlorperazine edisylate; Toradol [ketorolac tromethamine]; and Tramadol  Chief Complaint  Patient presents with  . New Admit To SNF    following hospitalization 01/14/17 to 01/21/17 acute respirataory failure with hypoxia    HPI: Patient is 61 y.o. male with coronary artery disease, diabetes, hypertension, COPD, depression with anxiety, who presented to the emergency department [facility] with shortness of breath for 5 days. It started with a cold, got progressively worse until he was so short of breath he could not do any ADLs. Patient admits cough was productive but was unable to bring it up. Patient admits to chills and fever at home. Patient has history of COPD but is not on oxygen, does use inhalers. In ED patient's O2 sat was 80% CT showed multifocal bilateral bronchopneumonia. Patient was admitted to [facility] from 11/24-12/1 when he was treated for acute respiratory failure with hypoxia secondary to sepsis secondary to multifocal pneumonia. Since patient had been in the hospital in the past 3 months he was treated as a healthcare associated pneumonia and started on Levaquin and vancomycin and azactam, then the escalated to Levaquin and Flagyl. Hospital course was complicated by elevated liver enzymes felt to be secondary to pneumonia and hypoxia although patient's CT image was suggestive of hepatic steatosis. Liver enzymes improved during hospitalization.  Also patient has COPD exacerbation felt secondary to his pneumonia which was treated with steroids. Patient had a chronic pain syndrome which was treated with oxycodone. The patient told the hospitalist that he was not on any long-acting oxycodone at that time, which we will see later was not true. Patient needs skilled nursing facility with generalized weakness for OT/PT. A skilled nursing facility patient will be followed for hypertension treated with Norvasc and lisinopril, bipolar disease treated with Seroquel, and hypothyroidism treated with replacement.  Past Medical History:  Diagnosis Date  . Anginal pain ([place])   . Anxiety   . Arthritis    "right hip; herniated L4-5" (11/05/2015)  . Asthma   . Bipolar 1 disorder ([place])   . CHF (congestive heart failure) ([place])   . Chronic bronchitis ([place])   . Chronic lower back pain   . Chronic pain disorder   . Community acquired pneumonia 10/30/2016  . COPD (chronic obstructive pulmonary disease) ([place])   . Coronary artery disease   . Coronary atherosclerosis 12/05/2008   Qualifier: Diagnosis of  By: Sidney Ace    . DEEP VENOUS THROMBOPHLEBITIS, HX OF 12/05/2008   Qualifier: Diagnosis of  By: Sidney Ace    . DEPRESSION, MAJOR 12/05/2008   Qualifier: Diagnosis of  By: Sidney Ace    . Diabetes mellitus type 2 with complications ([place]) 3/81/0175  . Diverticulitis large intestine   . DVT (deep venous thrombosis) ([place]) 1991   "left calf; after 1st knee scope"  . EROSIVE GASTRITIS 12/05/2008   Qualifier: Diagnosis of  By: Sidney Ace    . Fungal esophagitis   . GERD 12/05/2008   Qualifier: Diagnosis of  By: Sidney Ace    . GERD (gastroesophageal  reflux disease)   . Heart attack ([place]) 2010   BMS x 2 RCA  . High cholesterol   . Hyperlipidemia   . Hypertension   . Hypothyroidism   . MRSA (methicillin resistant staph aureus) culture positive   . Myocardial infarction ([place])   . MYOCARDIAL INFARCTION, HX OF 12/05/2008   Qualifier:  Diagnosis of  By: Sidney Ace    . Obesity 12/05/2008   Qualifier: Diagnosis of  By: Sidney Ace    . Pneumonia    "several times"  . PULMONARY NODULE, LEFT LOWER LOBE 12/05/2008   Qualifier: Diagnosis of  By: Sidney Ace    . S/P angioplasty with stent 11/05/15 PCI DES to [facility]  11/06/2015  . Schizo affective schizophrenia ([place]) 2002  . SOB (shortness of breath) 08/22/2016  . Status post cholecystectomy 09/25/2014    Past Surgical History:  Procedure Laterality Date  . APPENDECTOMY    . CARDIAC CATHETERIZATION N/A 11/05/2015   Procedure: Left Heart Cath and Coronary Angiography;  Surgeon: Nelva Bush, MD;  Location: [place] CV LAB;  Service: Cardiovascular;  Laterality: N/A;  . CARDIAC CATHETERIZATION N/A 11/05/2015   Procedure: Coronary Stent Intervention;  Surgeon: Nelva Bush, MD;  Location: [place] CV LAB;  Service: Cardiovascular;  Laterality: N/A;  . CARDIAC CATHETERIZATION N/A 11/05/2015   Procedure: Intravascular Pressure Wire/FFR Study;  Surgeon: Nelva Bush, MD;  Location: [place] CV LAB;  Service: Cardiovascular;  Laterality: N/A;  . CORONARY ANGIOPLASTY WITH STENT PLACEMENT  2010   90% RCA s/p BMS x 2, 50% LAD, 30% CFX, EF 45-50%  . CORONARY ANGIOPLASTY WITH STENT PLACEMENT  11/05/2015   "1 today; makes a total of 4 stents" (11/05/2015)  . INGUINAL HERNIA REPAIR Right   . KNEE ARTHROSCOPY Left X 3  . LAPAROSCOPIC CHOLECYSTECTOMY    . TONSILLECTOMY AND ADENOIDECTOMY    . UPPER GI ENDOSCOPY  05/24/2016   UGI ENDO INCLUDE ESOPHAGUS, STOMACH & DUODENUM &/OR JEJUNUM; DX W/WO COLLECTION SPECIMEN BY [facility] OR [place]; SURGEON :Bing Quarry, MD LOCATION; [facility]    Allergies as of 01/23/2017      Reactions   Rocephin [ceftriaxone Sodium In Dextrose] Anaphylaxis   Zofran [ondansetron Hcl] Swelling   sts had tongue swelling and trouble breathing   Ampicillin    Lipitor [atorvastatin]    Prochlorperazine Edisylate Other (See Comments)    Childhood Reaction.   Toradol [ketorolac Tromethamine] Nausea And Vomiting   Pt states he is not allergic to ibuprofen   Tramadol Nausea And Vomiting      Medication List        Accurate as of 01/23/17 10:51 PM. Always use your most recent med list.          amLODipine 5 MG tablet Commonly known as:  NORVASC Take 5 mg by mouth. Take one tablet daily   aspirin EC 81 MG tablet Take 81 mg by mouth daily.   cyclobenzaprine 5 MG tablet Commonly known as:  FLEXERIL Take 5 mg by mouth. Take one tablet three times a day as needed for muscle spasms. stop 01/27/17   Fluticasone-Salmeterol 250-50 MCG/DOSE Aepb Commonly known as:  ADVAIR Inhale 1 puff into the lungs 2 (two) times daily.   gabapentin 300 MG capsule Commonly known as:  NEURONTIN Take 300 mg by mouth 3 (three) times daily.   ipratropium-albuterol 0.5-2.5 (3) MG/3ML Soln Commonly known as:  DUONEB Take 3 mLs by nebulization. Inhale 3 ml by nebulization every four hours as needed  levofloxacin 750 MG tablet Commonly known as:  LEVAQUIN Take 750 mg by mouth. Take one tablet daily for 3 days. Stop 01/24/17   levothyroxine 25 MCG tablet Commonly known as:  SYNTHROID, LEVOTHROID Take 25 mcg by mouth daily at 6 (six) AM.   lisinopril 10 MG tablet Commonly known as:  PRINIVIL,ZESTRIL Take 5 mg by mouth daily.   metroNIDAZOLE 500 MG tablet Commonly known as:  FLAGYL Take 500 mg by mouth. Take one tablet every 8 hours for 3 days. Stop 01/24/17   MULTI-VITAMINS Tabs Take 1 tablet by mouth daily.   nitroGLYCERIN 0.4 MG SL tablet Commonly known as:  NITROSTAT Place 0.4 mg under the tongue every 5 (five) minutes as needed for chest pain.   omeprazole 20 MG capsule Commonly known as:  PRILOSEC Take 20 mg by mouth 2 (two) times daily.   Oxycodone HCl 10 MG Tabs Take 10 mg by mouth. Take one tablet every 6 hours as needed for pain up to 7 days. Stop 01/27/17   polyethylene glycol packet Commonly known as:  MIRALAX /  GLYCOLAX Take 17 g by mouth daily as needed for mild constipation.   pravastatin 40 MG tablet Commonly known as:  PRAVACHOL Take 40 mg by mouth. Take one tablet nightly   QUEtiapine 50 MG tablet Commonly known as:  SEROQUEL Take 50 mg by mouth. Take one tablet two times a day   QUEtiapine 100 MG tablet Commonly known as:  SEROQUEL Take 100 mg by mouth. Take one tablet nightly   temazepam 30 MG capsule Commonly known as:  RESTORIL Take 1 capsule (30 mg total) by mouth at bedtime.   venlafaxine 75 MG tablet Commonly known as:  EFFEXOR Take 75 mg by mouth daily.   VENTOLIN HFA 108 (90 Base) MCG/ACT inhaler Generic drug:  albuterol Inhale into the lungs. 2 puffs into lungs every 6 hours as needed for wheezing       No orders of the defined types were placed in this encounter.   Immunization History  Administered Date(s) Administered  . Influenza Nasal 11/23/2015  . Influenza, Seasonal, Injecte, Preservative Fre 11/22/2010, 12/12/2012, 02/15/2014  . Influenza,inj,Quad PF,6+ Mos 11/26/2012, 01/17/2015, 11/06/2015  . Influenza-Unspecified 02/26/2014  . PPD Test 07/01/2016  . Pneumococcal Polysaccharide-23 02/22/2008, 11/26/2012, 12/12/2012    Social History   Tobacco Use  . Smoking status: Current Every Day Smoker    Years: 42.00    Types: Cigarettes  . Smokeless tobacco: Never Used  . Tobacco comment: last attempt to quit 04/12/16, down to 6 cigs daily   Substance Use Topics  . Alcohol use: No    Family history is   Family History  Problem Relation Age of Onset  . Heart Problems Mother        88  . Heart disease Father        24      Review of Systems  DATA OBTAINED: from patient, nurse GENERAL:  no fevers, fatigue, appetite changes SKIN: No itching, or rash EYES: No eye pain, redness, discharge EARS: No earache, tinnitus, change in hearing NOSE: No congestion, drainage or bleeding  MOUTH/THROAT: No mouth or tooth pain, No sore throat RESPIRATORY:  No cough, wheezing, SOB CARDIAC: No chest pain, palpitations, lower extremity edema  GI: No abdominal pain, No N/V/D or constipation, No heartburn or reflux  GU: No dysuria, frequency or urgency, or incontinence  MUSCULOSKELETAL: No unrelieved bone/joint pain; once his pain medicine every 4 hours as written by his pain clinic  NEUROLOGIC: No headache, dizziness or focal weakness PSYCHIATRIC: No c/o anxiety or sadness   Vitals:   01/23/17 2226  BP: 100/80  Pulse: 88  Resp: 16  Temp: (!) 96.6 F (35.9 C)  SpO2: 98%    SpO2 Readings from Last 1 Encounters:  01/23/17 98%   Body mass index is 31.74 kg/m.     Physical Exam  GENERAL APPEARANCE: Alert, conversant,  No acute distress.  SKIN: No diaphoresis rash HEAD: Normocephalic, atraumatic  EYES: Conjunctiva/lids clear. Pupils round, reactive. EOMs intact.  EARS: External exam WNL, canals clear. Hearing grossly normal.  NOSE: No deformity or discharge.  MOUTH/THROAT: Lips w/o lesions  RESPIRATORY: Breathing is even, unlabored. Lung sounds are clear   CARDIOVASCULAR: Heart RRR no murmurs, rubs or gallops. No peripheral edema.   GASTROINTESTINAL: Abdomen is soft, non-tender, not distended w/ normal bowel sounds. GENITOURINARY: Bladder non tender, not distended  MUSCULOSKELETAL: No abnormal joints or musculature NEUROLOGIC:  Cranial nerves 2-12 grossly intact. Moves all extremities  PSYCHIATRIC: Mood and affect appropriate to situation, no behavioral issues  Patient Active Problem List   Diagnosis Date Noted  . Fall 10/30/2016  . Community acquired pneumonia 10/30/2016  . Diabetic polyneuropathy ([place]) 10/30/2016  . Hyperlipidemia associated with type 2 diabetes mellitus ([place]) 10/30/2016  . SOB (shortness of breath) 08/22/2016  . Hip pain, acute, right 07/06/2016  . Arthritis 07/06/2016  . Chronic lower back pain 07/06/2016  . Hypothyroidism 07/06/2016  . Diabetes mellitus type 2 with complications ([place]) 00/86/7619  .  Chronic pain disorder   . Diverticulitis large intestine   . Hyperlipidemia   . Myocardial infarction ([place])   . S/P angioplasty with stent 11/05/15 PCI DES to mLAD  11/06/2015  . Ischemic chest pain   . Status post cholecystectomy 09/25/2014  . Chest pain 09/24/2014  . COPD exacerbation ([place]) 09/24/2014  . Chest pain of uncertain etiology   . Hyperlipidemia LDL goal <70 12/05/2008  . Obesity 12/05/2008  . DEPRESSION, MAJOR 12/05/2008  . Bipolar 1 disorder ([place]) 12/05/2008  . TOBACCO ABUSE 12/05/2008  . Hypertensive heart disease 12/05/2008  . MYOCARDIAL INFARCTION, HX OF 12/05/2008  . Coronary artery disease 12/05/2008  . COPD (chronic obstructive pulmonary disease) ([place]) 12/05/2008  . PULMONARY NODULE, LEFT LOWER LOBE 12/05/2008  . GERD 12/05/2008  . EROSIVE GASTRITIS 12/05/2008  . DEEP VENOUS THROMBOPHLEBITIS, HX OF 12/05/2008      Labs reviewed: Basic Metabolic Panel:    Component Value Date/Time   NA 138 01/21/2017   K 3.3 (A) 01/21/2017   CL 96 (L) 12/11/2016 1443   CO2 27 12/11/2016 1443   GLUCOSE 155 (H) 12/11/2016 1443   BUN 16 01/21/2017   CREATININE 0.7 01/21/2017   CREATININE 0.72 12/11/2016 1443   CALCIUM 9.6 12/11/2016 1443   PROT 8.1 12/11/2016 1443   ALBUMIN 5.0 12/11/2016 1443   AST 34 01/19/2017   ALT 73 (A) 01/19/2017   ALKPHOS 88 01/19/2017   BILITOT 1.0 12/11/2016 1443   GFRNONAA >60 12/11/2016 1443   GFRAA >60 12/11/2016 1443    Recent Labs    10/10/16 1118  10/25/16 12/11/16 1443 01/21/17  NA 135   < > 140 134* 138  K 4.2   < > 4.1 3.2* 3.3*  CL 98*  --   --  96*  --   CO2 25  --   --  27  --   GLUCOSE 101*  --   --  155*  --   BUN 9   < >  9 6 16   CREATININE 0.72   < > 0.6 0.72 0.7  CALCIUM 9.3  --   --  9.6  --    < > = values in this interval not displayed.   Liver Function Tests: Recent Labs    10/10/16 1118 12/11/16 1443 01/19/17  AST 21 17 34  ALT 15* 13* 73*  ALKPHOS 92 97 88  BILITOT 0.5 1.0  --   PROT 7.2 8.1  --     ALBUMIN 4.3 5.0  --    Recent Labs    10/10/16 1118 12/11/16 1443  LIPASE 18 14   No results for input(s): AMMONIA in the last 8760 hours. CBC: Recent Labs    10/10/16 1118  10/25/16 12/11/16 1204 01/21/17  WBC 10.0   < > 12.6 12.7* 11.4  NEUTROABS 7.4  --   --  8.6*  --   HGB 13.3   < > 12.0* 15.6 12.5*  HCT 37.9*   < > 36* 45.6 38*  MCV 84.4  --   --  85.2  --   PLT 228   < > 148* 239 283   < > = values in this interval not displayed.   Lipid Recent Labs    02/23/16  CHOL 133  HDL 34*  LDLCALC 67  TRIG 159    Cardiac Enzymes: Recent Labs    10/10/16 1118  CKTOTAL 53   BNP: No results for input(s): BNP in the last 8760 hours. No results found for: [facility] Lab Results  Component Value Date   HGBA1C  11/19/2008    5.2 (NOTE) The ADA recommends the following therapeutic goal for glycemic control related to Hgb A1c measurement: Goal of therapy: <6.5 Hgb A1c  Reference: American Diabetes Association: Clinical Practice Recommendations 2010, Diabetes Care, 2010, 33: (Suppl  1).   Lab Results  Component Value Date   TSH 3.21 02/23/2016   No results found for: VITAMINB12 No results found for: FOLATE No results found for: IRON, TIBC, FERRITIN  Imaging and Procedures obtained prior to SNF admission: Dg Chest 2 View  Result Date: 12/11/2016 CLINICAL DATA:  Flu like symptoms.  Cough and fever EXAM: CHEST  2 VIEW COMPARISON:  10/17/2016 FINDINGS: The heart size and mediastinal contours are within normal limits. Both lungs are clear. The visualized skeletal structures are unremarkable. IMPRESSION: No active cardiopulmonary disease. Electronically Signed   By: Franchot Gallo M.D.   On: 12/11/2016 13:46     Not all labs, radiology exams or other studies done during hospitalization come through on my EPIC note; however they are reviewed by me.    Assessment and Plan  ACUTE RESPIRATORY FAILURE WITH HYPOXIA/MULTIFOCAL PNEUMONIA/SEPSIS-patient with history of  multiple pneumonias in the past; treated as healthcare associated pneumonia with Levaquin and vancomycin and Azactam; barium swallow showed silent penetration but no aspiration-patient remains on nectar thick liquids and regular diet; 11/29 patient changed over to Levaquin and Flagyl for atenolol day course of IV antibiotic 7 stopped SNF - admitted to skilled nursing facility for OT/PT; patient is not on O2; patient will be on Levaquin 750 mg for 3 more days and Flagyl 500 mg every 8 hours for 3 more days  ACUTE COPD EXACERBATION-2/2 pneumonia; service started 11/27 and have been completed. Treated also with bronchodilators as per home regimen; counseling on smoking cessation SNF - continue Advair 1 puff in the lungs 2 times daily and when necessary DuoNeb nebs and albuterol MDI when necessary  Coronary artery disease-followed  by Dr. Little Ishikawa; history of MI 3, status post stents 4 SNF - continue home meds of ASA 81 mg daily, sublingual nitroglycerin when necessary, and statin  CHRONIC PAIN SYNDROME-per hospitalist notes patient told hospitalist that he was taking only short acting oxycodone SNF - patient was sent from hospital on oxycodone 10 to every 8 when necessary and nurses report that he almost immediately began to ask for oxycodone 10 every 4 hours like he was getting for his home regimen and he got this because he arrived over the weekend; when I arrived looked him up under [facility] and saw that he was on a long-acting hydrocodone with short acting hydrocodone daily; I verified this with patient's pain clinic and made them aware of the patient's claims; patient's pain clinic assured me that patient had signed a contract stating he would take a long-acting medication during the day and only one oxycodone 10 daily as well so patient's pain medications were changed to [place] er { oxycodone) 18mg  q 12 and ocycodone 10 mg once daily prn  HYPERTENSION SNF - stable; continue Norvasc  10 mg by mouth daily and lisinopril 5 mg by mouth daily  BIPOLAR DISEASE SNF - continue patient's Seroquel 50 mg 2 times daily and 100 mg at night  HYPOTHYROIDISM SNF - stable; continue Synthroid 25 g by mouth daily  HYPERLIPIDEMIA SNF - not stated as uncontrolled; continue Pravachol 40 mg by mouth daily at bedtime   Time spent greater than 45 minutes;> 50% of time with patient was spent reviewing records, labs, tests and studies, counseling and developing plan of care  Webb Silversmith D. Sheppard Coil, MD

## 2017-01-29 ENCOUNTER — Encounter: Payer: Self-pay | Admitting: Internal Medicine

## 2017-01-29 DIAGNOSIS — J9601 Acute respiratory failure with hypoxia: Secondary | ICD-10-CM | POA: Insufficient documentation

## 2017-01-29 DIAGNOSIS — J189 Pneumonia, unspecified organism: Secondary | ICD-10-CM | POA: Insufficient documentation

## 2017-01-29 DIAGNOSIS — A419 Sepsis, unspecified organism: Secondary | ICD-10-CM | POA: Insufficient documentation

## 2017-02-03 ENCOUNTER — Non-Acute Institutional Stay (SKILLED_NURSING_FACILITY): Payer: Medicare HMO | Admitting: Internal Medicine

## 2017-02-03 ENCOUNTER — Encounter: Payer: Self-pay | Admitting: Internal Medicine

## 2017-02-03 DIAGNOSIS — I25118 Atherosclerotic heart disease of native coronary artery with other forms of angina pectoris: Secondary | ICD-10-CM

## 2017-02-03 DIAGNOSIS — E785 Hyperlipidemia, unspecified: Secondary | ICD-10-CM | POA: Diagnosis not present

## 2017-02-03 DIAGNOSIS — A419 Sepsis, unspecified organism: Secondary | ICD-10-CM

## 2017-02-03 DIAGNOSIS — E1169 Type 2 diabetes mellitus with other specified complication: Secondary | ICD-10-CM | POA: Diagnosis not present

## 2017-02-03 DIAGNOSIS — G894 Chronic pain syndrome: Secondary | ICD-10-CM

## 2017-02-03 DIAGNOSIS — E034 Atrophy of thyroid (acquired): Secondary | ICD-10-CM | POA: Diagnosis not present

## 2017-02-03 DIAGNOSIS — J189 Pneumonia, unspecified organism: Secondary | ICD-10-CM

## 2017-02-03 DIAGNOSIS — F319 Bipolar disorder, unspecified: Secondary | ICD-10-CM

## 2017-02-03 DIAGNOSIS — I119 Hypertensive heart disease without heart failure: Secondary | ICD-10-CM | POA: Diagnosis not present

## 2017-02-03 DIAGNOSIS — J441 Chronic obstructive pulmonary disease with (acute) exacerbation: Secondary | ICD-10-CM | POA: Diagnosis not present

## 2017-02-03 DIAGNOSIS — J9601 Acute respiratory failure with hypoxia: Secondary | ICD-10-CM

## 2017-02-03 NOTE — Progress Notes (Addendum)
Location:  [facility] Room Number: [place]:  SNF [phone])  Provider: Noah Delaine. Sheppard Coil, MD  PCP: Beckie Salts, MD Patient Care Team: Beckie Salts, MD as PCP - General (Internal Medicine)  Extended Emergency Contact Information Primary Emergency Contact: [place] Mobile Phone: [phone] Relation: Daughter  Allergies  Allergen Reactions  . Rocephin [Ceftriaxone Sodium In Dextrose] Anaphylaxis  . Zofran [Ondansetron Hcl] Swelling    sts had tongue swelling and trouble breathing  . Ampicillin   . Lipitor [Atorvastatin]   . Prochlorperazine Edisylate Other (See Comments)    Childhood Reaction.  . Toradol [Ketorolac Tromethamine] Nausea And Vomiting    Pt states he is not allergic to ibuprofen  . Tramadol Nausea And Vomiting    Chief Complaint  Patient presents with  . Discharge Note    discharge from SNF to home    HPI:  61 y.o. male  with COPD, diabetes, hypertension,  coronary artery disease, depression with anxiety, who was admitted to [facility] from 11/24-12/1 for acute respiratory failure with hypoxia secondary to sepsis secondary to multifocal pneumonia. Since patient had been in the hospital in the past 3 months he was treated as a healthcare associated pneumonia and started on will quantity vancomycin and Azactam vitamin D escalated to Levaquin and Flagyl. Hospital course was complicated by elevated liver enzymes felt to be secondary to pneumonia although patient's CT image was suggestive of hepatic steatosis. Liver enzymes improved during hospitalization. Patient also had COPD exacerbation secondary to pneumonia which was treated with steroids and inhalers. Patient has chronic pain syndrome which was treated with oxycodone. Patient to the hospitalist that he was not on a long-acting oxycodone at that time which according to [facility] pmp aware and per patient's pain clinic is not  true. Patient was admitted to skilled nursing facility with generalized weakness for OT/PT and is now ready to be discharged to home.    Past Medical History:  Diagnosis Date  . Anginal pain ([place])   . Anxiety   . Arthritis    "right hip; herniated L4-5" (11/05/2015)  . Asthma   . Bipolar 1 disorder ([place])   . CHF (congestive heart failure) ([place])   . Chronic bronchitis ([place])   . Chronic lower back pain   . Chronic pain disorder   . Community acquired pneumonia 10/30/2016  . COPD (chronic obstructive pulmonary disease) ([place])   . Coronary artery disease   . Coronary atherosclerosis 12/05/2008   Qualifier: Diagnosis of  By: Sidney Ace    . DEEP VENOUS THROMBOPHLEBITIS, HX OF 12/05/2008   Qualifier: Diagnosis of  By: Sidney Ace    . DEPRESSION, MAJOR 12/05/2008   Qualifier: Diagnosis of  By: Sidney Ace    . Diabetes mellitus type 2 with complications ([place]) 6/76/7209  . Diverticulitis large intestine   . DVT (deep venous thrombosis) ([place]) 1991   "left calf; after 1st knee scope"  . EROSIVE GASTRITIS 12/05/2008   Qualifier: Diagnosis of  By: Sidney Ace    . Fungal esophagitis   . GERD 12/05/2008   Qualifier: Diagnosis of  By: Sidney Ace    . GERD (gastroesophageal reflux disease)   . Heart attack ([place]) 2010   BMS x 2 RCA  . High cholesterol   . Hyperlipidemia   . Hypertension   . Hypothyroidism   . MRSA (methicillin resistant staph aureus) culture positive   . Myocardial infarction ([place])   .  MYOCARDIAL INFARCTION, HX OF 12/05/2008   Qualifier: Diagnosis of  By: Sidney Ace    . Obesity 12/05/2008   Qualifier: Diagnosis of  By: Sidney Ace    . Pneumonia    "several times"  . PULMONARY NODULE, LEFT LOWER LOBE 12/05/2008   Qualifier: Diagnosis of  By: Sidney Ace    . S/P angioplasty with stent 11/05/15 PCI DES to [facility]  11/06/2015  . Schizo affective schizophrenia ([place]) 2002  . SOB (shortness of breath) 08/22/2016  . Status post cholecystectomy  09/25/2014    Past Surgical History:  Procedure Laterality Date  . APPENDECTOMY    . CARDIAC CATHETERIZATION N/A 11/05/2015   Procedure: Left Heart Cath and Coronary Angiography;  Surgeon: Nelva Bush, MD;  Location: [place] CV LAB;  Service: Cardiovascular;  Laterality: N/A;  . CARDIAC CATHETERIZATION N/A 11/05/2015   Procedure: Coronary Stent Intervention;  Surgeon: Nelva Bush, MD;  Location: [place] CV LAB;  Service: Cardiovascular;  Laterality: N/A;  . CARDIAC CATHETERIZATION N/A 11/05/2015   Procedure: Intravascular Pressure Wire/FFR Study;  Surgeon: Nelva Bush, MD;  Location: [place] CV LAB;  Service: Cardiovascular;  Laterality: N/A;  . CORONARY ANGIOPLASTY WITH STENT PLACEMENT  2010   90% RCA s/p BMS x 2, 50% LAD, 30% CFX, EF 45-50%  . CORONARY ANGIOPLASTY WITH STENT PLACEMENT  11/05/2015   "1 today; makes a total of 4 stents" (11/05/2015)  . INGUINAL HERNIA REPAIR Right   . KNEE ARTHROSCOPY Left X 3  . LAPAROSCOPIC CHOLECYSTECTOMY    . TONSILLECTOMY AND ADENOIDECTOMY    . UPPER GI ENDOSCOPY  05/24/2016   UGI ENDO INCLUDE ESOPHAGUS, STOMACH & DUODENUM &/OR JEJUNUM; DX W/WO COLLECTION SPECIMEN BY BR[place] OR [place]; SURGEON :ALBERT San Morelle, MD LOCATION; [place]     reports that he has been smoking cigarettes.  He has smoked for the past 42.00 years. he has never used smokeless tobacco. He reports that he does not drink alcohol or use drugs. Social History   Socioeconomic History  . Marital status: Divorced    Spouse name: Not on file  . Number of children: Not on file  . Years of education: Not on file  . Highest education level: Not on file  Social Needs  . Financial resource strain: Not on file  . Food insecurity - worry: Not on file  . Food insecurity - inability: Not on file  . Transportation needs - medical: Not on file  . Transportation needs - non-medical: Not on file  Occupational History  . Occupation: Disabled  Tobacco Use  .  Smoking status: Current Every Day Smoker    Years: 42.00    Types: Cigarettes  . Smokeless tobacco: Never Used  . Tobacco comment: last attempt to quit 04/12/16, down to 6 cigs daily   Substance and Sexual Activity  . Alcohol use: No  . Drug use: No  . Sexual activity: Yes  Other Topics Concern  . Not on file  Social History Narrative   Lives alone in [place].   Admitted to [facility] 01/21/17   Divorced   Smokes daily   Alcohol none   Full Code    Pertinent  Health Maintenance Due  Topic Date Due  . FOOT EXAM  07/12/1964  . OPHTHALMOLOGY EXAM  07/12/1964  . COLONOSCOPY  07/12/2004  . HEMOGLOBIN A1C  05/19/2009  . INFLUENZA VACCINE  09/21/2016    Medications: Allergies as of 02/03/2017      Reactions  Rocephin [ceftriaxone Sodium In Dextrose] Anaphylaxis   Zofran [ondansetron Hcl] Swelling   sts had tongue swelling and trouble breathing   Ampicillin    Lipitor [atorvastatin]    Prochlorperazine Edisylate Other (See Comments)   Childhood Reaction.   Toradol [ketorolac Tromethamine] Nausea And Vomiting   Pt states he is not allergic to ibuprofen   Tramadol Nausea And Vomiting      Medication List        Accurate as of 02/03/17 11:59 PM. Always use your most recent med list.          amLODipine 5 MG tablet Commonly known as:  NORVASC Take 5 mg by mouth. Take one tablet daily   aspirin EC 81 MG tablet Take 81 mg by mouth daily.   Fluticasone-Salmeterol 250-50 MCG/DOSE Aepb Commonly known as:  ADVAIR Inhale 1 puff into the lungs 2 (two) times daily.   gabapentin 300 MG capsule Commonly known as:  NEURONTIN Take 300 mg by mouth 3 (three) times daily.   ipratropium-albuterol 0.5-2.5 (3) MG/3ML Soln Commonly known as:  DUONEB Take 3 mLs by nebulization. Inhale 3 ml by nebulization every four hours as needed   levothyroxine 25 MCG tablet Commonly known as:  SYNTHROID, LEVOTHROID Take 25 mcg by mouth daily at 6 (six) AM.   lisinopril  10 MG tablet Commonly known as:  PRINIVIL,ZESTRIL Take 5 mg by mouth daily.   MULTI-VITAMINS Tabs Take 1 tablet by mouth daily.   nitroGLYCERIN 0.4 MG SL tablet Commonly known as:  NITROSTAT Place 0.4 mg under the tongue every 5 (five) minutes as needed for chest pain.   omeprazole 20 MG capsule Commonly known as:  PRILOSEC Take 20 mg by mouth 2 (two) times daily.   Oxycodone HCl 10 MG Tabs Take 10 mg by mouth. Take one tablet every 6 hours as needed for pain   polyethylene glycol packet Commonly known as:  MIRALAX / GLYCOLAX Take 17 g by mouth daily as needed for mild constipation.   pravastatin 40 MG tablet Commonly known as:  PRAVACHOL Take 40 mg by mouth. Take one tablet nightly   promethazine 25 MG tablet Commonly known as:  PHENERGAN Take 25 mg by mouth. Take one tablet every 6 hours as needed for nausea/vomiting   QUEtiapine 50 MG tablet Commonly known as:  SEROQUEL Take 50 mg by mouth. Take one tablet two times a day   QUEtiapine 100 MG tablet Commonly known as:  SEROQUEL Take 100 mg by mouth. Take one tablet nightly   venlafaxine 75 MG tablet Commonly known as:  EFFEXOR Take 75 mg by mouth daily.   VENTOLIN HFA 108 (90 Base) MCG/ACT inhaler Generic drug:  albuterol Inhale into the lungs. 2 puffs into lungs every 6 hours as needed for wheezing        Vitals:   02/03/17 0923  BP: (!) 145/70  Pulse: 82  Resp: 18  Temp: (!) 97.5 F (36.4 C)  SpO2: 98%  Weight: 237 lb (107.5 kg)  Height: 6' (1.829 m)   Body mass index is 32.14 kg/m.  Physical Exam  GENERAL APPEARANCE: Alert, conversant. No acute distress.  HEENT: Unremarkable. RESPIRATORY: Breathing is even, unlabored. Lung sounds are clear   CARDIOVASCULAR: Heart RRR no murmurs, rubs or gallops. No peripheral edema.  GASTROINTESTINAL: Abdomen is soft, non-tender, not distended w/ normal bowel sounds.  NEUROLOGIC: Cranial nerves 2-12 grossly intact. Moves all extremities   Labs  reviewed: Basic Metabolic Panel: Recent Labs    10/10/16 1118  10/25/16 12/11/16 1443 01/21/17  NA 135   < > 140 134* 138  K 4.2   < > 4.1 3.2* 3.3*  CL 98*  --   --  96*  --   CO2 25  --   --  27  --   GLUCOSE 101*  --   --  155*  --   BUN 9   < > 9 6 16   CREATININE 0.72   < > 0.6 0.72 0.7  CALCIUM 9.3  --   --  9.6  --    < > = values in this interval not displayed.   No results found for: [facility] Liver Function Tests: Recent Labs    10/10/16 1118 12/11/16 1443 01/19/17  AST 21 17 34  ALT 15* 13* 73*  ALKPHOS 92 97 88  BILITOT 0.5 1.0  --   PROT 7.2 8.1  --   ALBUMIN 4.3 5.0  --    Recent Labs    10/10/16 1118 12/11/16 1443  LIPASE 18 14   No results for input(s): AMMONIA in the last 8760 hours. CBC: Recent Labs    10/10/16 1118  10/25/16 12/11/16 1204 01/21/17  WBC 10.0   < > 12.6 12.7* 11.4  NEUTROABS 7.4  --   --  8.6*  --   HGB 13.3   < > 12.0* 15.6 12.5*  HCT 37.9*   < > 36* 45.6 38*  MCV 84.4  --   --  85.2  --   PLT 228   < > 148* 239 283   < > = values in this interval not displayed.   Lipid Recent Labs    02/23/16  CHOL 133  HDL 34*  LDLCALC 67  TRIG 159   Cardiac Enzymes: Recent Labs    10/10/16 1118  CKTOTAL 53   BNP: No results for input(s): BNP in the last 8760 hours. CBG: No results for input(s): GLUCAP in the last 8760 hours.  Procedures and Imaging Studies During Stay: No results found.  Assessment/Plan:   Acute respiratory failure with hypoxia (HCC)  HCAP (healthcare-associated pneumonia)  Sepsis, due to unspecified organism ([place])  COPD exacerbation ([place])  Coronary artery disease of native artery of native heart with stable angina pectoris (HCC)  Chronic pain disorder  Bipolar 1 disorder ([place])  Hypertensive heart disease without heart failure  Hyperlipidemia associated with type 2 diabetes mellitus ([place])  Hypothyroidism due to acquired atrophy of thyroid   Patient is being discharged with the following  home health services:  OT/PT/nursing  Patient is being discharged with the following durable medical equipment:  Hospital bed  Patient has been advised to f/u with their PCP in 1-2 weeks to bring them up to date on their rehab stay.  Social services at facility was responsible for arranging this appointment.  Pt was provided with a 30 day supply of prescriptions for medications and refills must be obtained from their PCP.  For controlled substances, a more limited supply may be provided adequate until PCP appointment only.  Spoke with patient's pain clinic; he is to call them to get his prescriptions; they know that I am providing 1 weeks worth of pain meds  Medications have been reconciled.   Time spent greater than 30 minutes;> 50% of time with patient was spent reviewing records, labs, tests and studies, counseling and developing plan of care  Noah Delaine. Sheppard Coil, MD

## 2017-02-04 ENCOUNTER — Encounter: Payer: Self-pay | Admitting: Internal Medicine

## 2017-02-11 ENCOUNTER — Other Ambulatory Visit: Payer: Self-pay | Admitting: Internal Medicine

## 2017-02-12 ENCOUNTER — Other Ambulatory Visit: Payer: Self-pay | Admitting: Internal Medicine

## 2017-02-27 ENCOUNTER — Telehealth: Payer: Self-pay | Admitting: *Deleted

## 2017-02-27 NOTE — Telephone Encounter (Signed)
Caryl Pina with [facility] called requesting Home Health Orders for patient.  This is not our patient, was discharged from [facility].  Instructed nurse to call PCP. Agreed.

## 2017-03-16 ENCOUNTER — Other Ambulatory Visit: Payer: Self-pay | Admitting: Internal Medicine

## 2017-03-21 ENCOUNTER — Encounter (HOSPITAL_BASED_OUTPATIENT_CLINIC_OR_DEPARTMENT_OTHER): Payer: Self-pay | Admitting: *Deleted

## 2017-03-21 DIAGNOSIS — M25511 Pain in right shoulder: Secondary | ICD-10-CM | POA: Insufficient documentation

## 2017-03-21 DIAGNOSIS — Y92009 Unspecified place in unspecified non-institutional (private) residence as the place of occurrence of the external cause: Secondary | ICD-10-CM | POA: Insufficient documentation

## 2017-03-21 DIAGNOSIS — W108XXA Fall (on) (from) other stairs and steps, initial encounter: Secondary | ICD-10-CM | POA: Diagnosis not present

## 2017-03-21 DIAGNOSIS — I110 Hypertensive heart disease with heart failure: Secondary | ICD-10-CM | POA: Insufficient documentation

## 2017-03-21 DIAGNOSIS — Z7982 Long term (current) use of aspirin: Secondary | ICD-10-CM | POA: Insufficient documentation

## 2017-03-21 DIAGNOSIS — Y9301 Activity, walking, marching and hiking: Secondary | ICD-10-CM | POA: Insufficient documentation

## 2017-03-21 DIAGNOSIS — Y999 Unspecified external cause status: Secondary | ICD-10-CM | POA: Diagnosis not present

## 2017-03-21 DIAGNOSIS — E039 Hypothyroidism, unspecified: Secondary | ICD-10-CM | POA: Insufficient documentation

## 2017-03-21 DIAGNOSIS — J45909 Unspecified asthma, uncomplicated: Secondary | ICD-10-CM | POA: Diagnosis not present

## 2017-03-21 DIAGNOSIS — I2510 Atherosclerotic heart disease of native coronary artery without angina pectoris: Secondary | ICD-10-CM | POA: Diagnosis not present

## 2017-03-21 DIAGNOSIS — J449 Chronic obstructive pulmonary disease, unspecified: Secondary | ICD-10-CM | POA: Insufficient documentation

## 2017-03-21 DIAGNOSIS — S299XXA Unspecified injury of thorax, initial encounter: Secondary | ICD-10-CM | POA: Diagnosis present

## 2017-03-21 DIAGNOSIS — E119 Type 2 diabetes mellitus without complications: Secondary | ICD-10-CM | POA: Diagnosis not present

## 2017-03-21 DIAGNOSIS — I509 Heart failure, unspecified: Secondary | ICD-10-CM | POA: Diagnosis not present

## 2017-03-21 DIAGNOSIS — S20211A Contusion of right front wall of thorax, initial encounter: Secondary | ICD-10-CM | POA: Diagnosis not present

## 2017-03-21 NOTE — ED Triage Notes (Addendum)
Pt slipped on the wet stair outside of his house and fell, landing on his right shoulder. Abrasion to pts right shoulder blade. Reports inability to move right shoulder. No obvious deformity. Reports increased pain with deep inspiration.

## 2017-03-22 ENCOUNTER — Telehealth (HOSPITAL_BASED_OUTPATIENT_CLINIC_OR_DEPARTMENT_OTHER): Payer: Self-pay | Admitting: *Deleted

## 2017-03-22 ENCOUNTER — Emergency Department (HOSPITAL_BASED_OUTPATIENT_CLINIC_OR_DEPARTMENT_OTHER): Payer: Medicare HMO

## 2017-03-22 ENCOUNTER — Emergency Department (HOSPITAL_BASED_OUTPATIENT_CLINIC_OR_DEPARTMENT_OTHER)
Admission: EM | Admit: 2017-03-22 | Discharge: 2017-03-22 | Disposition: A | Discharge status: Home / Self Care | Payer: Medicare HMO | Attending: Emergency Medicine | Admitting: Emergency Medicine

## 2017-03-22 DIAGNOSIS — M25511 Pain in right shoulder: Secondary | ICD-10-CM

## 2017-03-22 DIAGNOSIS — S20211A Contusion of right front wall of thorax, initial encounter: Secondary | ICD-10-CM

## 2017-03-22 DIAGNOSIS — W108XXA Fall (on) (from) other stairs and steps, initial encounter: Secondary | ICD-10-CM

## 2017-03-22 MED ORDER — HYDROCODONE-ACETAMINOPHEN 5-325 MG PO TABS: 1.0000 | ORAL_TABLET | Freq: Four times a day (QID) | ORAL | 0 refills | Status: DC | PRN

## 2017-03-22 MED ORDER — OXYCODONE-ACETAMINOPHEN 5-325 MG PO TABS
1.0000 | ORAL_TABLET | Freq: Once | ORAL | Status: AC
  Administered 2017-03-22: 1 via ORAL
  Filled 2017-03-22: qty 1

## 2017-03-22 NOTE — Discharge Instructions (Signed)
You were seen today after a fall.  Your x-rays are negative for any fracture.  You likely sustained bruising to your right rib cage and shoulder.  Take Norco as needed for pain.  Make sure to take deep breaths to prevent pneumonia.  Follow-up with your primary physician.

## 2017-03-22 NOTE — ED Provider Notes (Signed)
[facility]ERGENCY DEPARTMENT Provider Note   CSN: [id number] Arrival date & time: 03/21/17  2341     History   Chief Complaint Chief Complaint  Patient presents with  . Fall    HPI [name] is a 62 y.o. male.  HPI  This is a 62 year old male with history of CHF, coronary artery disease, DVT, bipolar disorder who presents following a fall.  Patient reports that he slipped on some steps and fell backward onto his right shoulder and right chest.  He denies hitting his head or loss of consciousness.  He denies using anticoagulants.  Patient reports 10 out of 10 pain in the right shoulder and right chest.  He reports increasing pain with deep breathing and range of motion of the right shoulder.  Denies numbness, tingling, weakness.  He has been ambulatory.  Past Medical History:  Diagnosis Date  . Anginal pain ([place])   . Anxiety   . Arthritis    "right hip; herniated L4-5" (11/05/2015)  . Asthma   . Bipolar 1 disorder ([place])   . CHF (congestive heart failure) ([place])   . Chronic bronchitis ([place])   . Chronic lower back pain   . Chronic pain disorder   . Community acquired pneumonia 10/30/2016  . COPD (chronic obstructive pulmonary disease) ([place])   . Coronary artery disease   . Coronary atherosclerosis 12/05/2008   Qualifier: Diagnosis of  By: Sidney Ace    . DEEP VENOUS THROMBOPHLEBITIS, HX OF 12/05/2008   Qualifier: Diagnosis of  By: Sidney Ace    . DEPRESSION, MAJOR 12/05/2008   Qualifier: Diagnosis of  By: Sidney Ace    . Diabetes mellitus type 2 with complications ([place]) 0/16/0109  . Diverticulitis large intestine   . DVT (deep venous thrombosis) ([place]) 1991   "left calf; after 1st knee scope"  . EROSIVE GASTRITIS 12/05/2008   Qualifier: Diagnosis of  By: Sidney Ace    . Fungal esophagitis   . GERD 12/05/2008   Qualifier: Diagnosis of  By: Sidney Ace    . GERD (gastroesophageal reflux disease)   . Heart attack ([place]) 2010   BMS x 2  RCA  . High cholesterol   . Hyperlipidemia   . Hypertension   . Hypothyroidism   . MRSA (methicillin resistant staph aureus) culture positive   . Myocardial infarction ([place])   . MYOCARDIAL INFARCTION, HX OF 12/05/2008   Qualifier: Diagnosis of  By: Sidney Ace    . Obesity 12/05/2008   Qualifier: Diagnosis of  By: Sidney Ace    . Pneumonia    "several times"  . PULMONARY NODULE, LEFT LOWER LOBE 12/05/2008   Qualifier: Diagnosis of  By: Sidney Ace    . S/P angioplasty with stent 11/05/15 PCI DES to [facility]  11/06/2015  . Schizo affective schizophrenia ([place]) 2002  . SOB (shortness of breath) 08/22/2016  . Status post cholecystectomy 09/25/2014    Patient Active Problem List   Diagnosis Date Noted  . Acute respiratory failure with hypoxia ([employer]) 01/29/2017  . HCAP (healthcare-associated pneumonia) 01/29/2017  . Sepsis ([place]) 01/29/2017  . Fall 10/30/2016  . Community acquired pneumonia 10/30/2016  . Diabetic polyneuropathy ([place]) 10/30/2016  . Hyperlipidemia associated with type 2 diabetes mellitus ([place]) 10/30/2016  . SOB (shortness of breath) 08/22/2016  . Hip pain, acute, right 07/06/2016  . Arthritis 07/06/2016  . Chronic lower back pain 07/06/2016  . Hypothyroidism 07/06/2016  . Diabetes mellitus type 2 with complications ([place]) 32/35/5732  . Chronic pain disorder   .  Diverticulitis large intestine   . Hyperlipidemia   . Myocardial infarction ([place])   . S/P angioplasty with stent 11/05/15 PCI DES to mLAD  11/06/2015  . Ischemic chest pain   . Status post cholecystectomy 09/25/2014  . Chest pain 09/24/2014  . COPD exacerbation ([place]) 09/24/2014  . Chest pain of uncertain etiology   . Hyperlipidemia LDL goal <70 12/05/2008  . Obesity 12/05/2008  . DEPRESSION, MAJOR 12/05/2008  . Bipolar 1 disorder ([place]) 12/05/2008  . TOBACCO ABUSE 12/05/2008  . Hypertensive heart disease 12/05/2008  . MYOCARDIAL INFARCTION, HX OF 12/05/2008  . Coronary artery disease 12/05/2008  . COPD  (chronic obstructive pulmonary disease) ([place]) 12/05/2008  . PULMONARY NODULE, LEFT LOWER LOBE 12/05/2008  . GERD 12/05/2008  . EROSIVE GASTRITIS 12/05/2008  . DEEP VENOUS THROMBOPHLEBITIS, HX OF 12/05/2008    Past Surgical History:  Procedure Laterality Date  . APPENDECTOMY    . CARDIAC CATHETERIZATION N/A 11/05/2015   Procedure: Left Heart Cath and Coronary Angiography;  Surgeon: Nelva Bush, MD;  Location: [facility];  Service: Cardiovascular;  Laterality: N/A;  . CARDIAC CATHETERIZATION N/A 11/05/2015   Procedure: Coronary Stent Intervention;  Surgeon: Nelva Bush, MD;  Location: [facility];  Service: Cardiovascular;  Laterality: N/A;  . CARDIAC CATHETERIZATION N/A 11/05/2015   Procedure: Intravascular Pressure Wire/FFR Study;  Surgeon: Nelva Bush, MD;  Location: [facility];  Service: Cardiovascular;  Laterality: N/A;  . CORONARY ANGIOPLASTY WITH STENT PLACEMENT  2010   90% RCA s/p BMS x 2, 50% LAD, 30% CFX, EF 45-50%  . CORONARY ANGIOPLASTY WITH STENT PLACEMENT  11/05/2015   "1 today; makes a total of 4 stents" (11/05/2015)  . INGUINAL HERNIA REPAIR Right   . KNEE ARTHROSCOPY Left X 3  . LAPAROSCOPIC CHOLECYSTECTOMY    . TONSILLECTOMY AND ADENOIDECTOMY    . UPPER GI ENDOSCOPY  05/24/2016   UGI ENDO INCLUDE ESOPHAGUS, STOMACH & DUODENUM &/OR JEJUNUM; DX W/WO COLLECTION SPECIMEN BY BRUSH OR Ehrhardt; SURGEON :ALBERT San Morelle, MD LOCATION; [facility]ations    Prior to Admission medications   Medication Sig Start Date End Date Taking? Authorizing Provider  albuterol (VENTOLIN HFA) 108 (90 Base) MCG/ACT inhaler Inhale into the lungs. 2 puffs into lungs every 6 hours as needed for wheezing    [provider]  amLODipine (NORVASC) 5 MG tablet Take 5 mg by mouth. Take one tablet daily    [provider]  aspirin EC 81 MG tablet Take 81 mg by mouth daily.     [provider]  Fluticasone-Salmeterol  (ADVAIR) 250-50 MCG/DOSE AEPB Inhale 1 puff into the lungs 2 (two) times daily.     [provider]  gabapentin (NEURONTIN) 300 MG capsule Take 300 mg by mouth 3 (three) times daily.    [provider]  guaiFENesin (MUCINEX) 600 MG 12 hr tablet Take 600 mg by mouth 2 (two) times daily. End Date 12/24    [provider]  HYDROcodone-acetaminophen (NORCO/VICODIN) 5-325 MG tablet Take 1 tablet by mouth every 6 (six) hours as needed. 03/22/17   Shanyia Stines, Barbette Hair, MD  ipratropium-albuterol (DUONEB) 0.5-2.5 (3) MG/3ML SOLN Take 3 mLs by nebulization. Inhale 3 ml by nebulization every four hours as needed    [provider]  levothyroxine (SYNTHROID, LEVOTHROID) 25 MCG tablet Take 25 mcg by mouth daily at 6 (six) AM.     [provider]  lisinopril (PRINIVIL,ZESTRIL) 10 MG tablet Take  5 mg by mouth daily.     [provider]  Multiple Vitamin (MULTI-VITAMINS) TABS Take 1 tablet by mouth daily.    [provider]  naloxone [facility]) nasal spray 4 mg/0.1 mL Place 1 spray into the nose once. May repeat in 2-5 minutes; call EMS immediately    [provider]  nitroGLYCERIN (NITROSTAT) 0.4 MG SL tablet Place 0.4 mg under the tongue every 5 (five) minutes as needed for chest pain.     [provider]  omeprazole (PRILOSEC) 20 MG capsule Take 20 mg by mouth 2 (two) times daily.     [provider]  Oxycodone HCl 10 MG TABS Take 10 mg by mouth. Take one tablet every 6 hours as needed for pain    [provider]  polyethylene glycol (MIRALAX / GLYCOLAX) packet Take 17 g by mouth daily as needed for mild constipation.     [provider]  pravastatin (PRAVACHOL) 40 MG tablet Take 40 mg by mouth. Take one tablet nightly    [provider]  promethazine (PHENERGAN) 25 MG tablet Take 25 mg by mouth. Take one tablet every 6 hours as needed for nausea/vomiting    [provider]  QUEtiapine  (SEROQUEL) 100 MG tablet Take 100 mg by mouth. Take one tablet nightly    [provider]  QUEtiapine (SEROQUEL) 50 MG tablet Take 50 mg by mouth. Take one tablet two times a day    [provider]  venlafaxine (EFFEXOR) 75 MG tablet Take 75 mg by mouth daily.     [provider]    Family History Family History  Problem Relation Age of Onset  . Heart Problems Mother        29  . Heart disease Father        48    Social History Social History   Tobacco Use  . Smoking status: Current Every Day Smoker    Years: 42.00    Types: Cigarettes  . Smokeless tobacco: Never Used  . Tobacco comment: last attempt to quit 04/12/16, down to 6 cigs daily   Substance Use Topics  . Alcohol use: No  . Drug use: No     Allergies   Rocephin [ceftriaxone sodium in dextrose]; Zofran [ondansetron hcl]; Ampicillin; Lipitor [atorvastatin]; Prochlorperazine edisylate; Toradol [ketorolac tromethamine]; and Tramadol   Review of Systems Review of Systems  Respiratory: Negative for shortness of breath.   Cardiovascular: Positive for chest pain.  Musculoskeletal:       Right shoulder pain  Skin: Negative for wound.  Neurological: Negative for weakness and numbness.  All other systems reviewed and are negative.    Physical Exam Updated Vital Signs BP (!) 123/96 (BP Location: Left Arm)   Pulse 81   Temp 98.1 F (36.7 C) (Oral)   Resp (!) 22   Ht 6' (1.829 m)   Wt 106.6 kg (235 lb)   SpO2 100%   BMI 31.87 kg/m   Physical Exam  Constitutional: He is oriented to person, place, and time. No distress.  Disheveled appearing, no acute distress, ABCs intact  HENT:  Head: Normocephalic and atraumatic.  Neck: Normal range of motion. Neck supple.  Cardiovascular: Normal rate, regular rhythm and normal heart sounds.  No murmur heard. Pulmonary/Chest: Effort normal and breath sounds normal. No respiratory distress. He has no wheezes. He exhibits tenderness.  Right-sided  chest wall tenderness to palpation, no crepitus noted, no overlying skin changes  Abdominal: Soft. Bowel sounds are  normal. There is no tenderness. There is no rebound.  Musculoskeletal: He exhibits no edema.  Normal range of motion of the right shoulder, pain with range of motion, no gross deformities, tenderness to palpation over the posterior shoulder  Lymphadenopathy:    He has no cervical adenopathy.  Neurological: He is alert and oriented to person, place, and time.  5 out of 5 strength with bilateral grips, biceps, and triceps  Skin: Skin is warm and dry.  Psychiatric: He has a normal mood and affect.  Nursing note and vitals reviewed.    ED Treatments / Results  Labs (all labs ordered are listed, but only abnormal results are displayed) Labs Reviewed - No data to display  EKG  EKG Interpretation None       Radiology Dg Ribs Unilateral W/chest Right  Result Date: 03/22/2017 CLINICAL DATA:  Fall x tonight landing on Rt side. Pt has Rt lower posterior rib pain BB marks area of interest and surrounding. EXAM: RIGHT RIBS AND CHEST - 3+ VIEW COMPARISON:  Chest x-rays dated 02/21/2017 and 01/18/2017. FINDINGS: Heart size and mediastinal contours are within normal limits, stable. Lungs appear clear. No pleural effusion or pneumothorax seen. Osseous structures about the chest are unremarkable. No right-sided rib fracture or displacement identified. IMPRESSION: Negative. Electronically Signed   By: Franki Cabot M.D.   On: 03/22/2017 00:57   Dg Shoulder Right  Result Date: 03/22/2017 CLINICAL DATA:  Fall x tonight landing on RT shoulder. Pt has pain entirely with limited ROM. No old injury known. EXAM: RIGHT SHOULDER - 2+ VIEW COMPARISON:  None. FINDINGS: There is no evidence of fracture or dislocation. There is no evidence of arthropathy or other focal bone abnormality. Soft tissues are unremarkable. IMPRESSION: Negative. Electronically Signed   By: Franki Cabot M.D.   On: 03/22/2017  00:55    Procedures Procedures (including critical care time)  Medications Ordered in ED Medications  oxyCODONE-acetaminophen (PERCOCET/ROXICET) 5-325 MG per tablet 1 tablet (1 tablet Oral Given 03/22/17 0054)     Initial Impression / Assessment and Plan / ED Course  I have reviewed the triage vital signs and the nursing notes.  Pertinent labs & imaging results that were available during my care of the patient were reviewed by me and considered in my medical decision making (see chart for details).     Patient presents following a fall.  He is overall nontoxic appearing and ABCs are intact.  No outward signs of trauma.  He does have tenderness to palpation of the chest and the posterior shoulder.  X-rays are negative for fracture.  Suspect contusion versus occult rib fracture.  Discussed with patient pain control and supportive measures.  After history, exam, and medical workup I feel the patient has been appropriately medically screened and is safe for discharge home. Pertinent diagnoses were discussed with the patient. Patient was given return precautions.   Final Clinical Impressions(s) / ED Diagnoses   Final diagnoses:  Fall (on) (from) other stairs and steps, initial encounter  Contusion of right chest wall, initial encounter  Acute pain of right shoulder    ED Discharge Orders        Ordered    HYDROcodone-acetaminophen (NORCO/VICODIN) 5-325 MG tablet  Every 6 hours PRN     03/22/17 0132       Merryl Hacker, MD 03/22/17 [id number]

## 2017-03-29 IMAGING — US US ABDOMEN LIMITED
1 series · 14 of 22 positions shown · non-contrast
Comparison: Abdominal ultrasound September 07, 2014 performed
preoperatively.

CLINICAL DATA: Right upper quadrant abdominal pain for the past 2
weeks since cholecystectomy

EXAM:
US ABDOMEN LIMITED - RIGHT UPPER QUADRANT

[Series 1: us abdomen limited · 0.21mm/px · 14 of 22 slices shown]
[im 1/22]
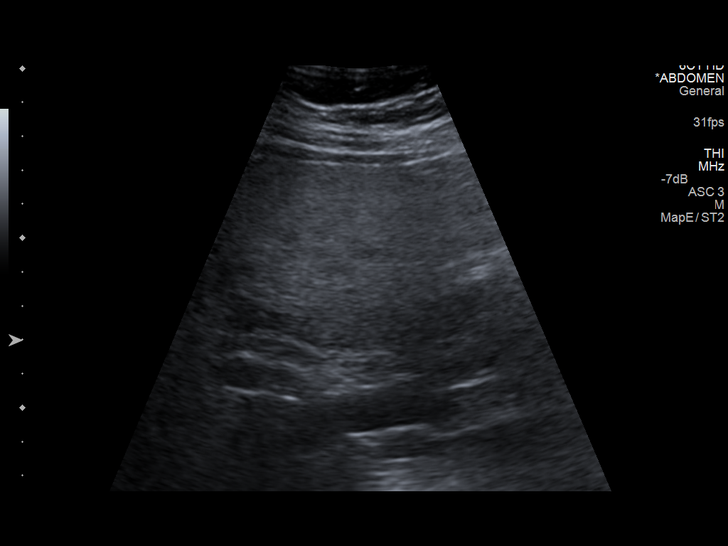
[im 3/22]
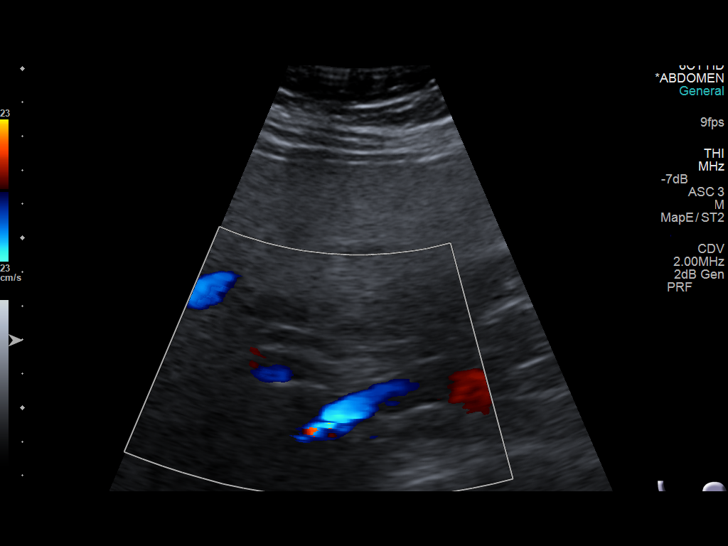
[im 4/22]
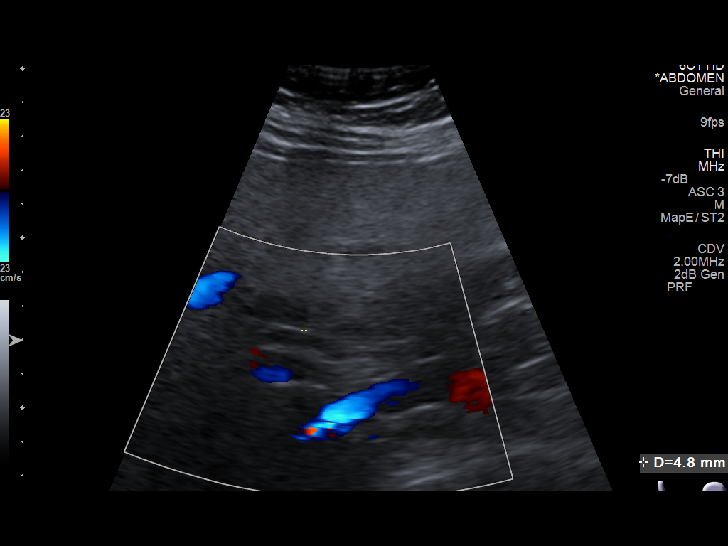
[im 6/22]
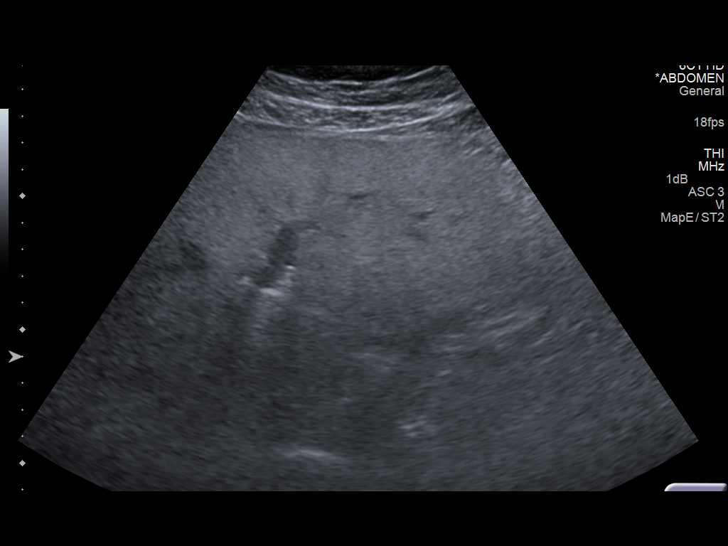
[im 8/22]
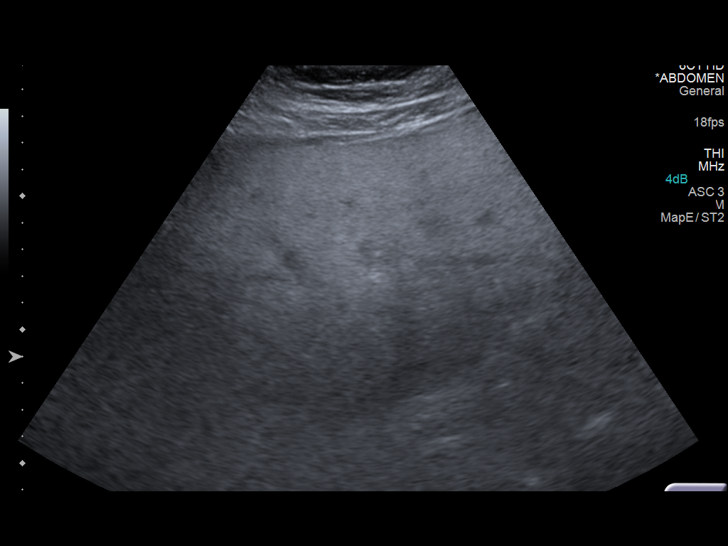
[im 9/22]
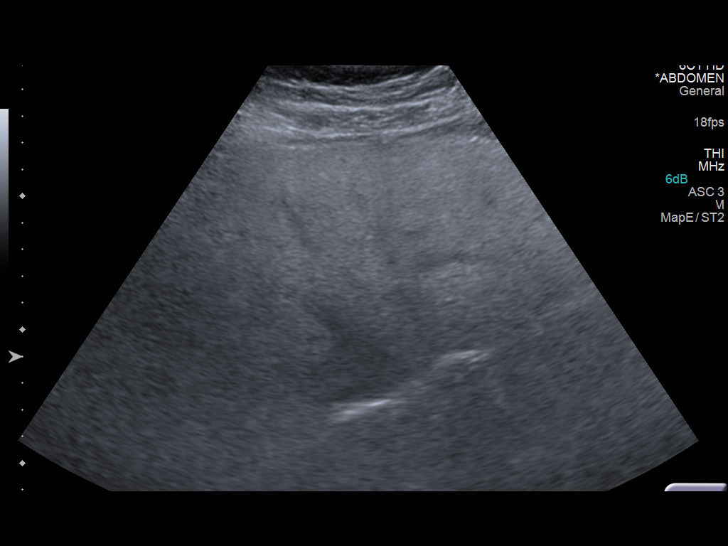
[im 11/22]
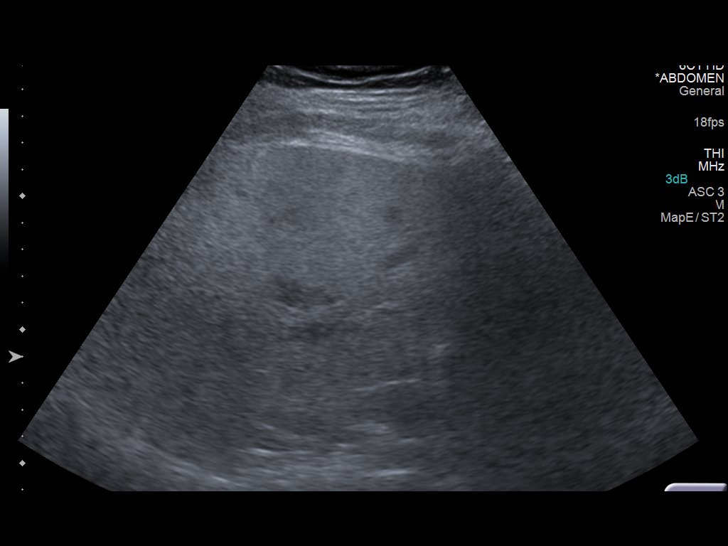
[im 12/22]
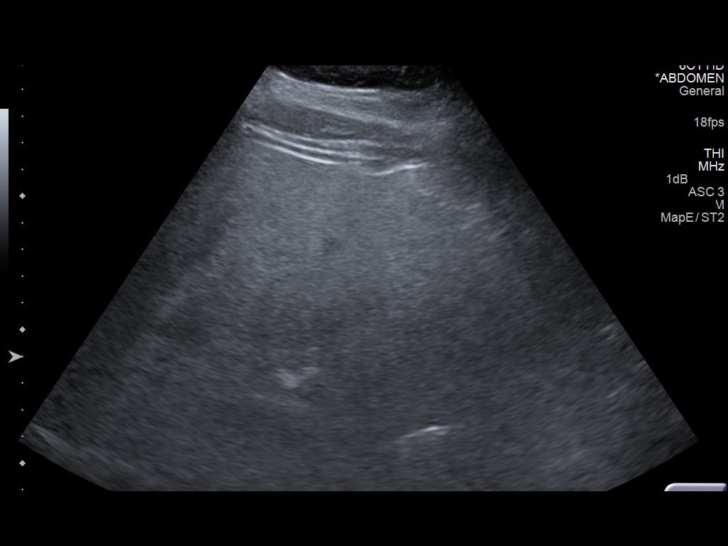
[im 14/22]
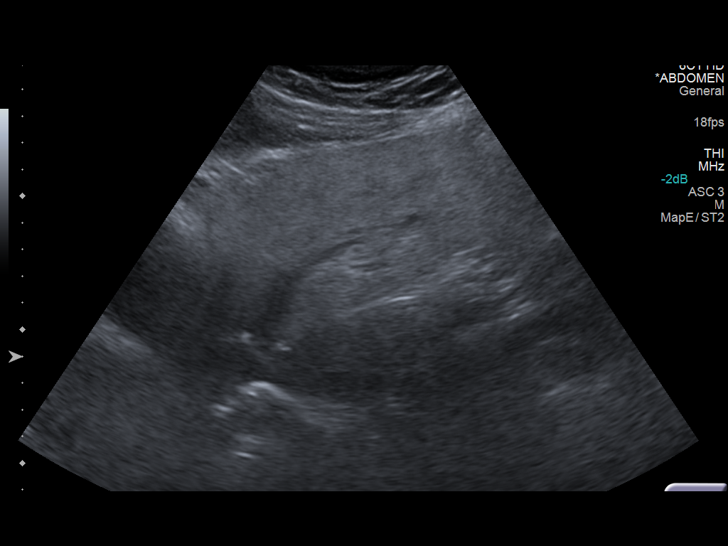
[im 15/22]
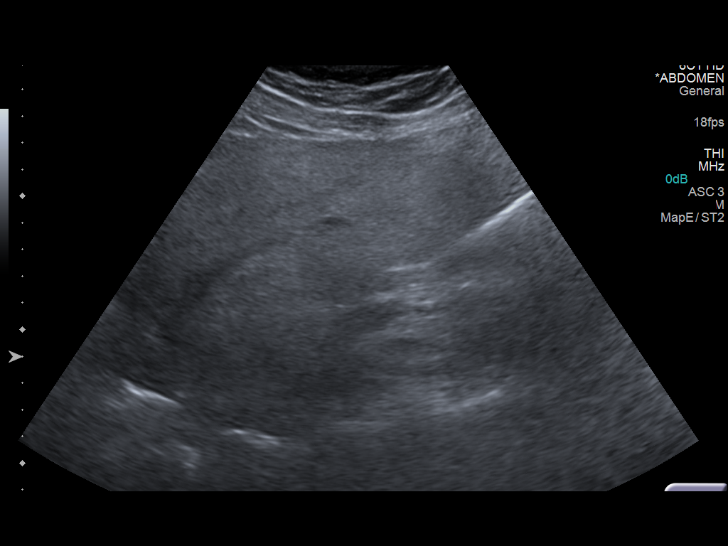
[im 17/22]
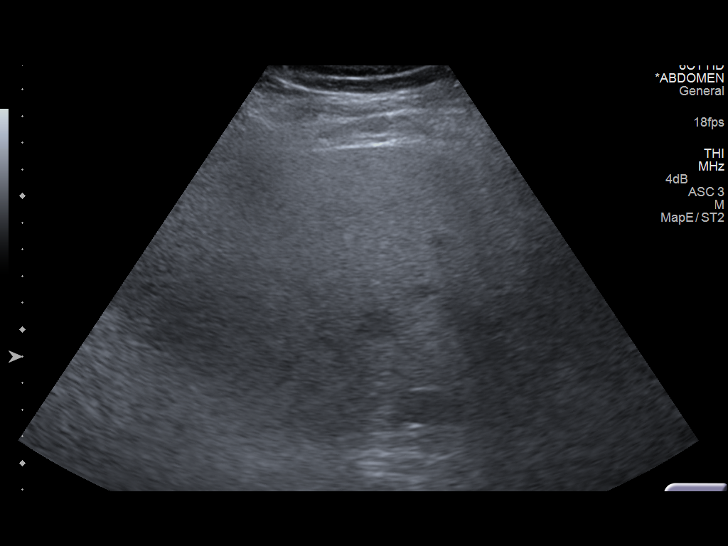
[im 19/22]
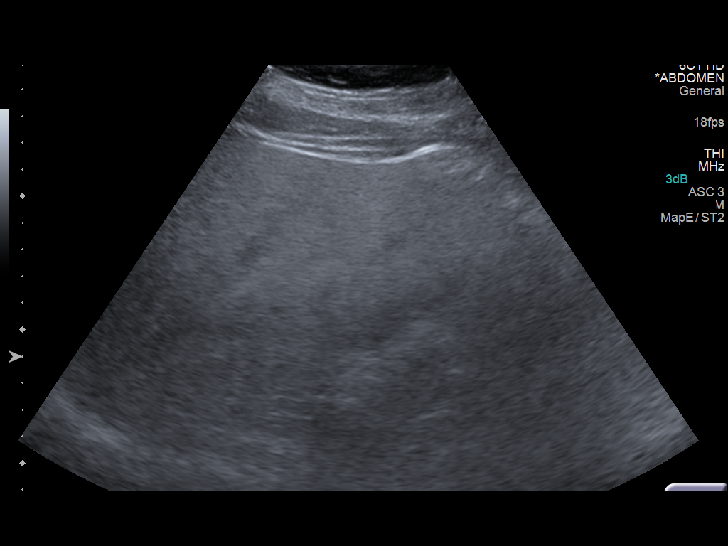
[im 20/22]
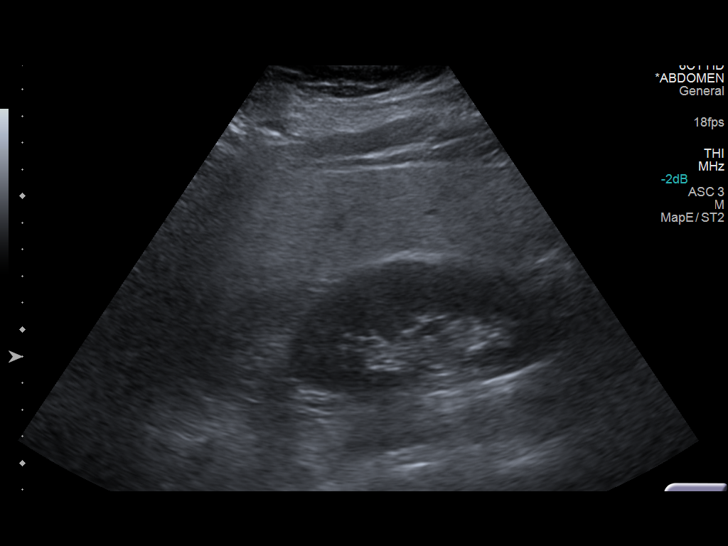
[im 22/22]
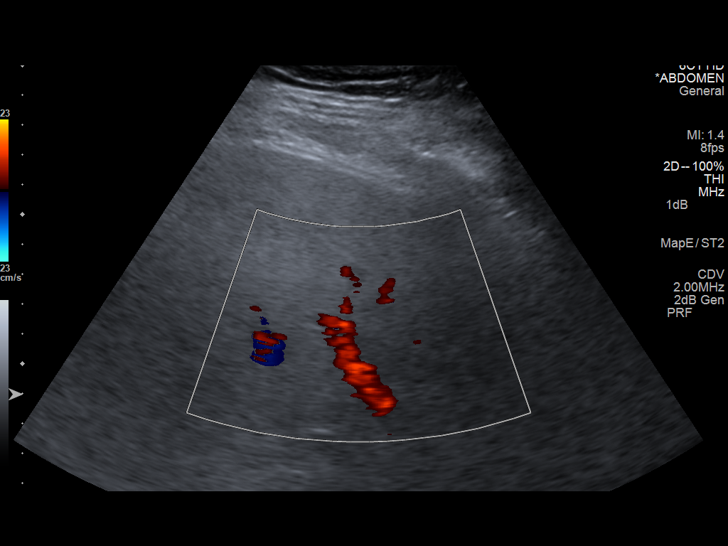

[14 of 22 positions shown; findings below may reference images not displayed]

FINDINGS: Gallbladder:

The gallbladder is surgically absent. There are no abnormal echoes
observed in the gallbladder fossa.

Common bile duct:

Diameter: 4.8 mm

Liver:

The hepatic echotexture remains increased diffusely. There is no
focal mass or ductal dilation.
IMPRESSION: 1. There is no acute abnormality demonstrated in the gallbladder
fossa.
2. Fatty infiltrative change of the liver.

## 2017-07-27 ENCOUNTER — Other Ambulatory Visit: Payer: Self-pay | Admitting: Internal Medicine

## 2017-07-28 IMAGING — DX DG CHEST 2V
2 series · 2 of 2 positions shown · non-contrast
Comparison: 11/05/2015 chest radiograph.

CLINICAL DATA: 60 y/o  M; chest pain with shortness of breath.

EXAM:
CHEST  2 VIEW

[chest lat]
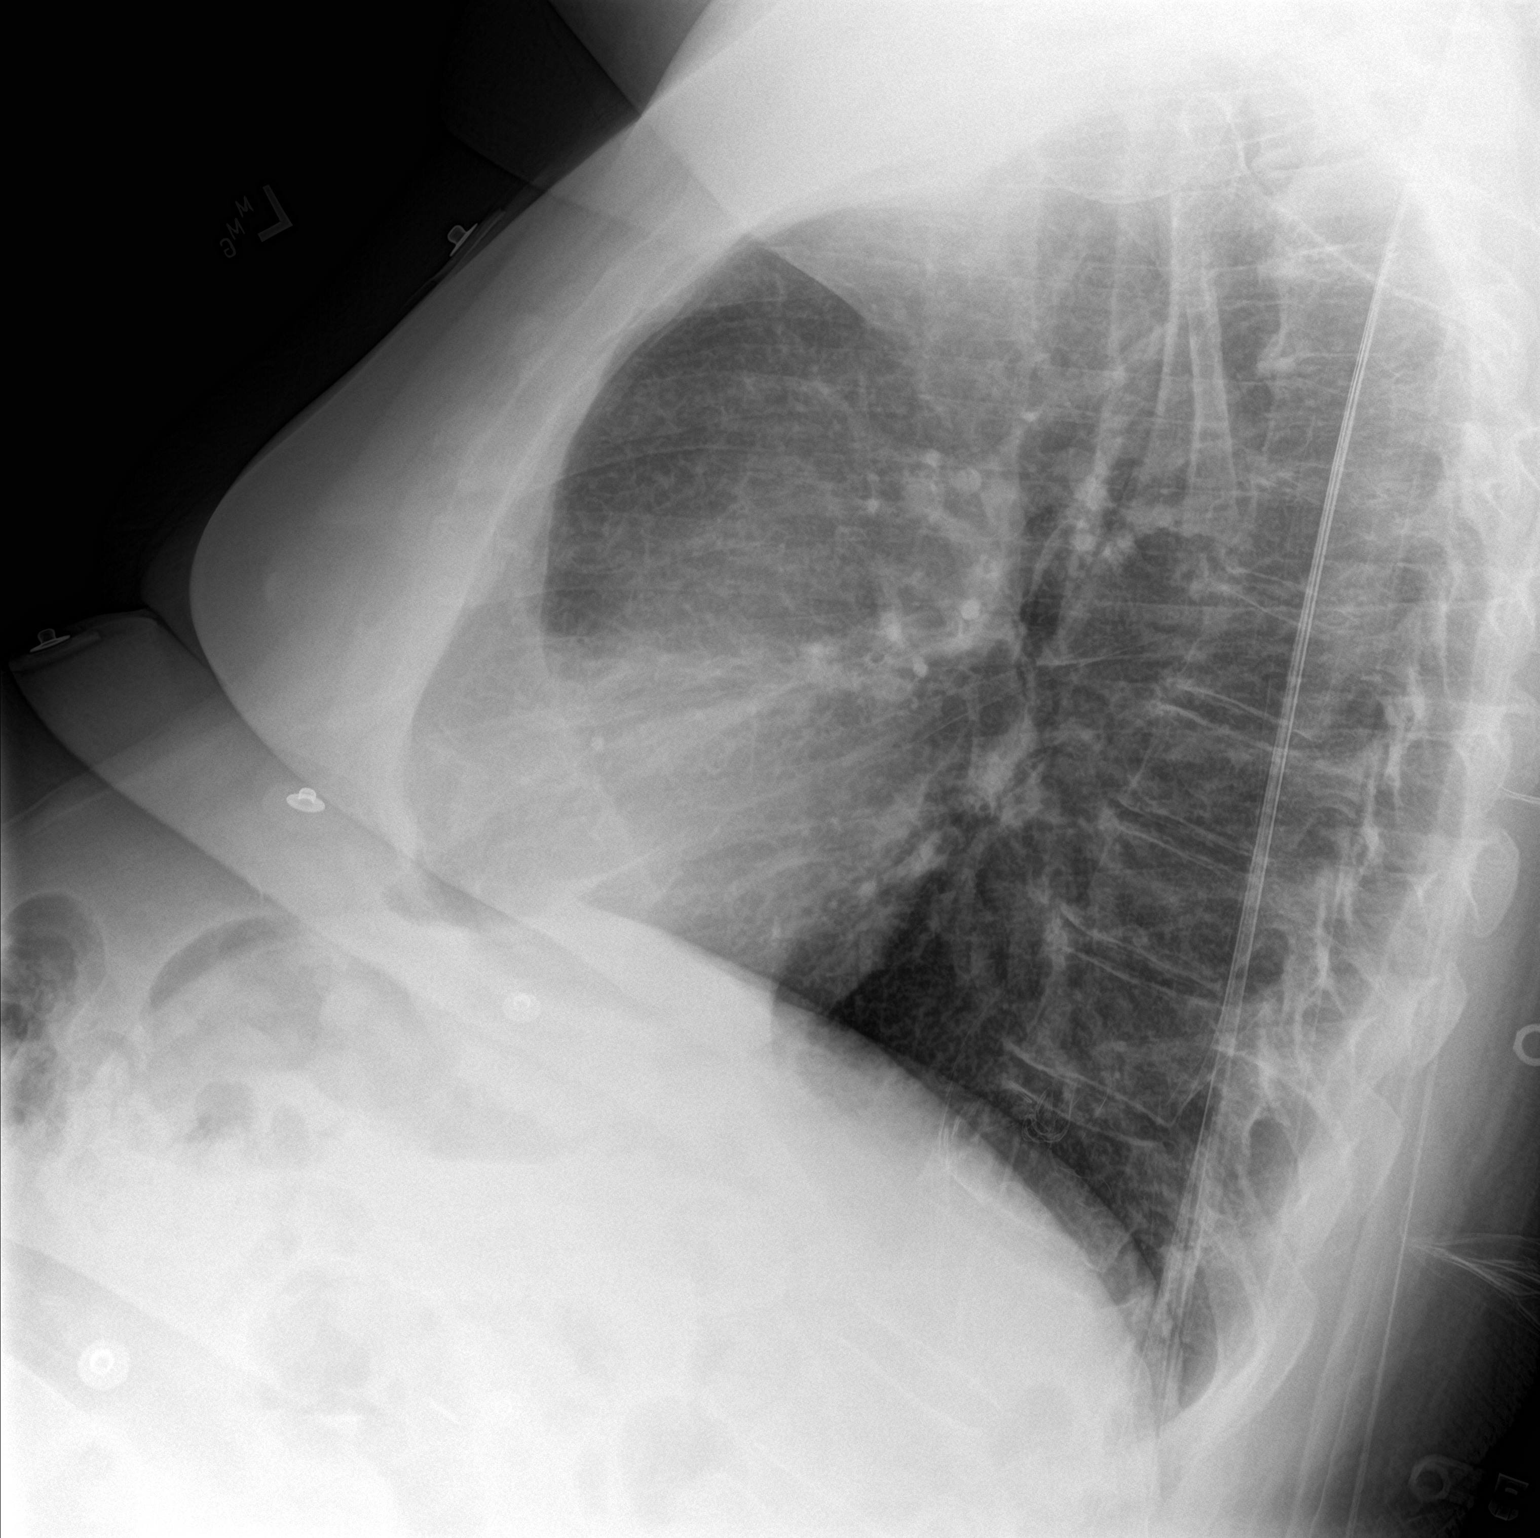

[chest ap]
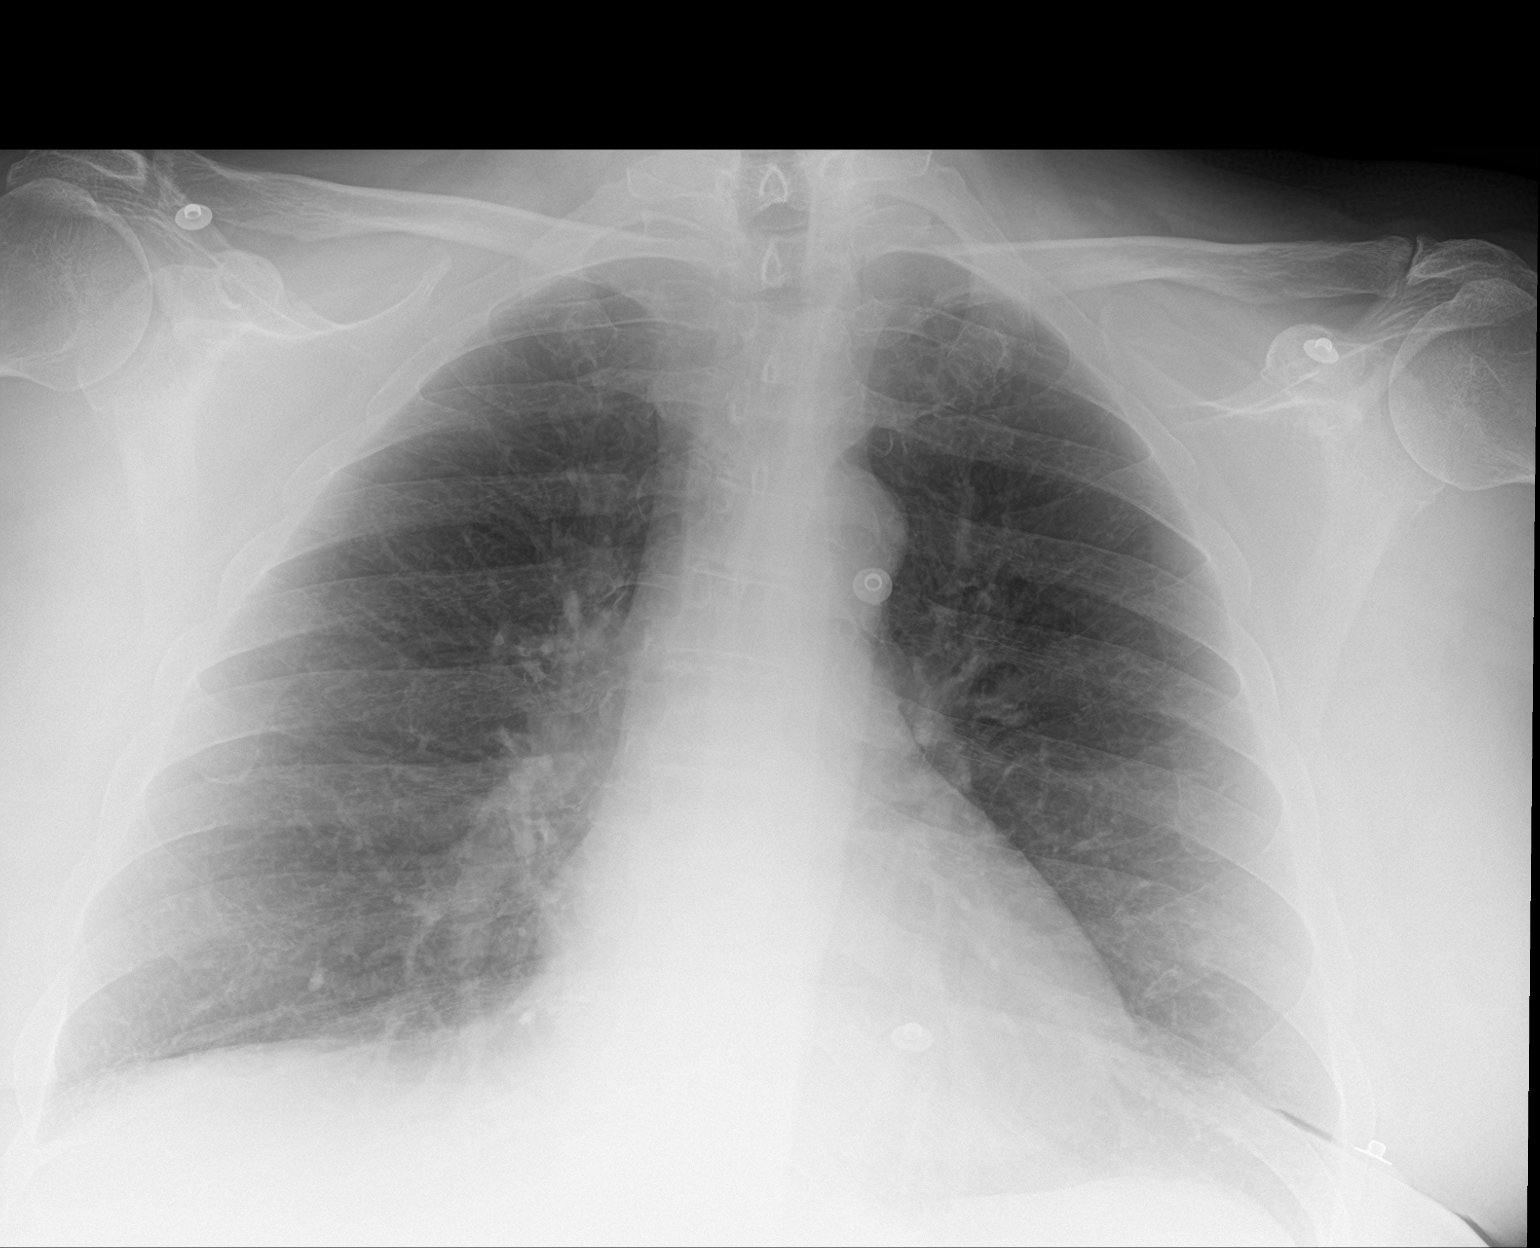

[2 of 2 positions shown; findings below may reference images not displayed]

FINDINGS: Stable cardiomediastinal silhouette given differences in technique.
Clear lungs. No pneumothorax. No pleural effusion. No acute osseous
abnormality is evident. Mild multilevel degenerative changes of the
spine.
IMPRESSION: Clear lungs.

By: Koryna Nizo M.D.

## 2018-12-09 IMAGING — DX DG RIBS W/ CHEST 3+V*R*
3 series · 3 of 3 positions shown · non-contrast
Comparison: Chest x-rays dated 02/21/2017 and 01/18/2017.

CLINICAL DATA: Fall x tonight landing on Rt side. Pt has Rt lower
posterior rib pain BB marks area of interest and surrounding.

EXAM:
RIGHT RIBS AND CHEST - 3+ VIEW

[rib ap]
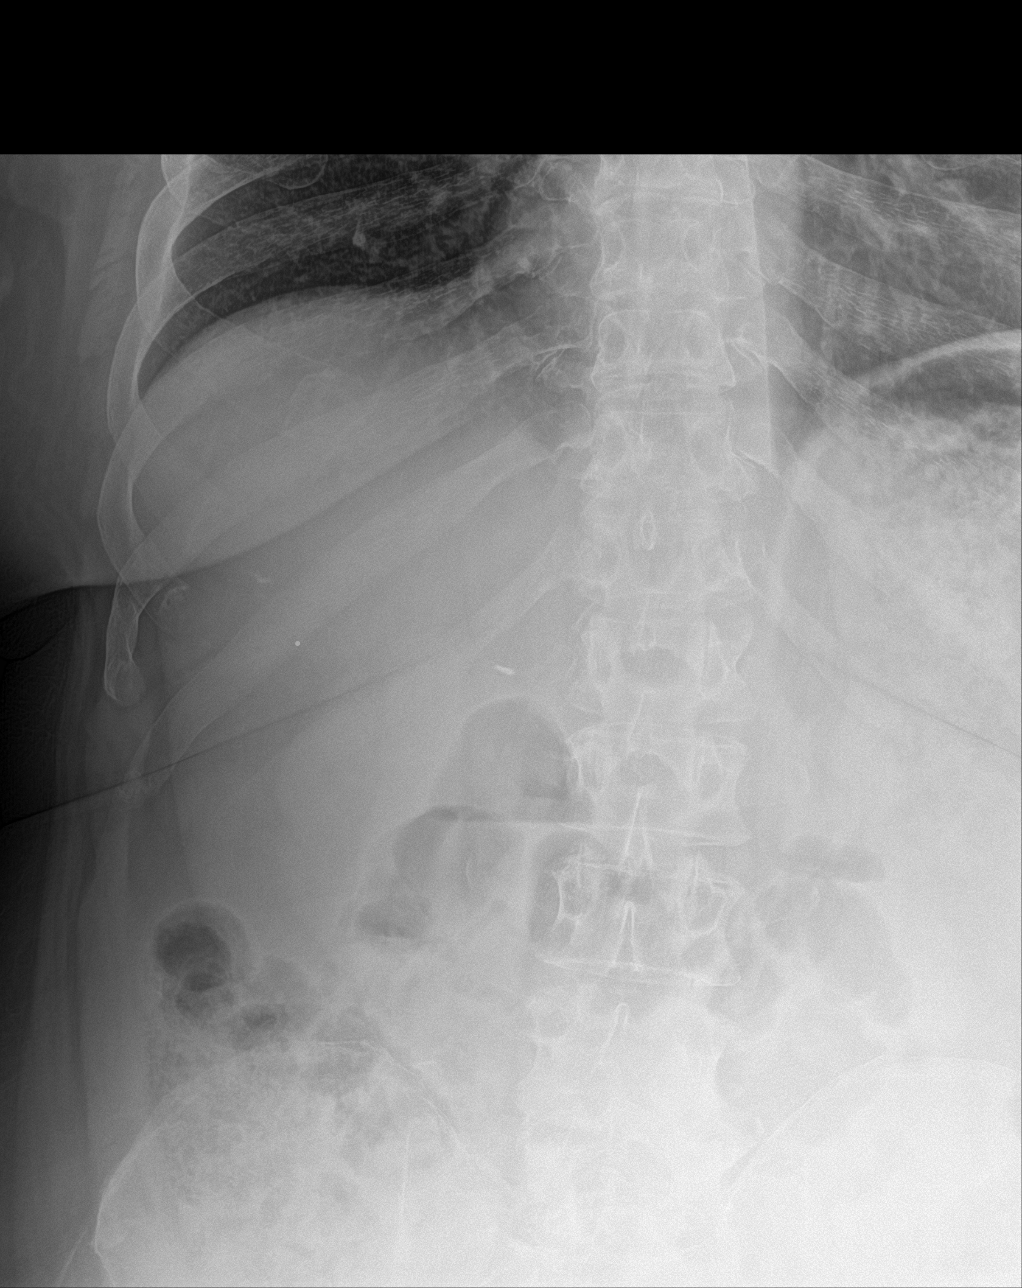

[chest pa]
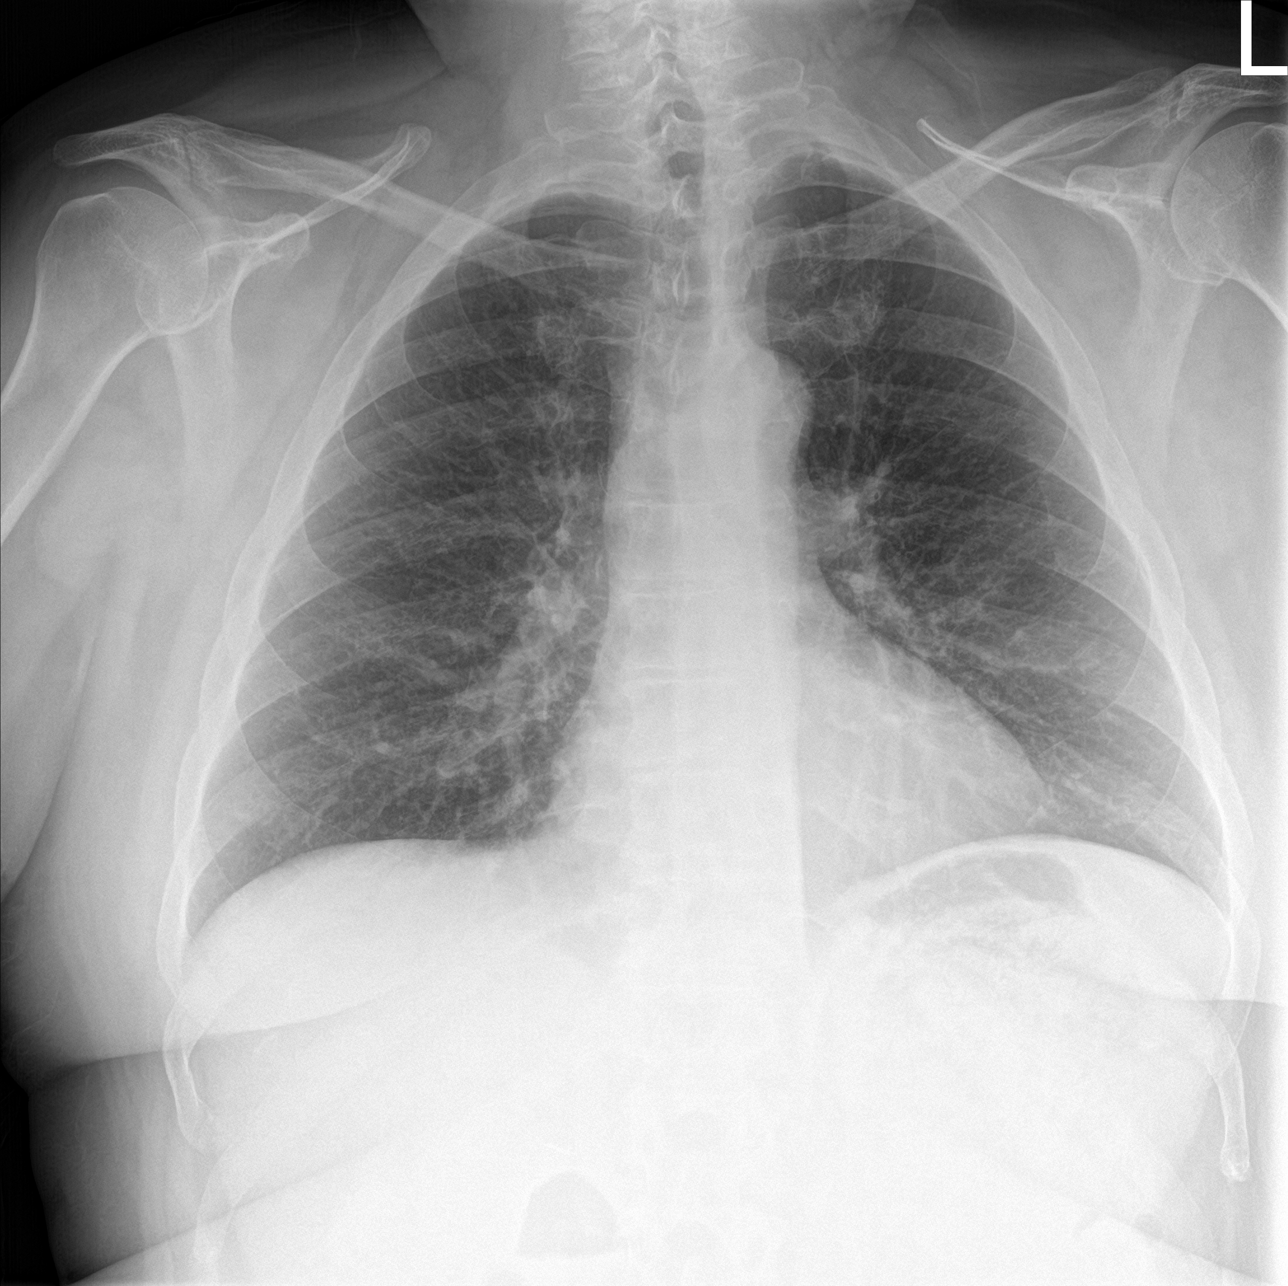

[rib ap obl]
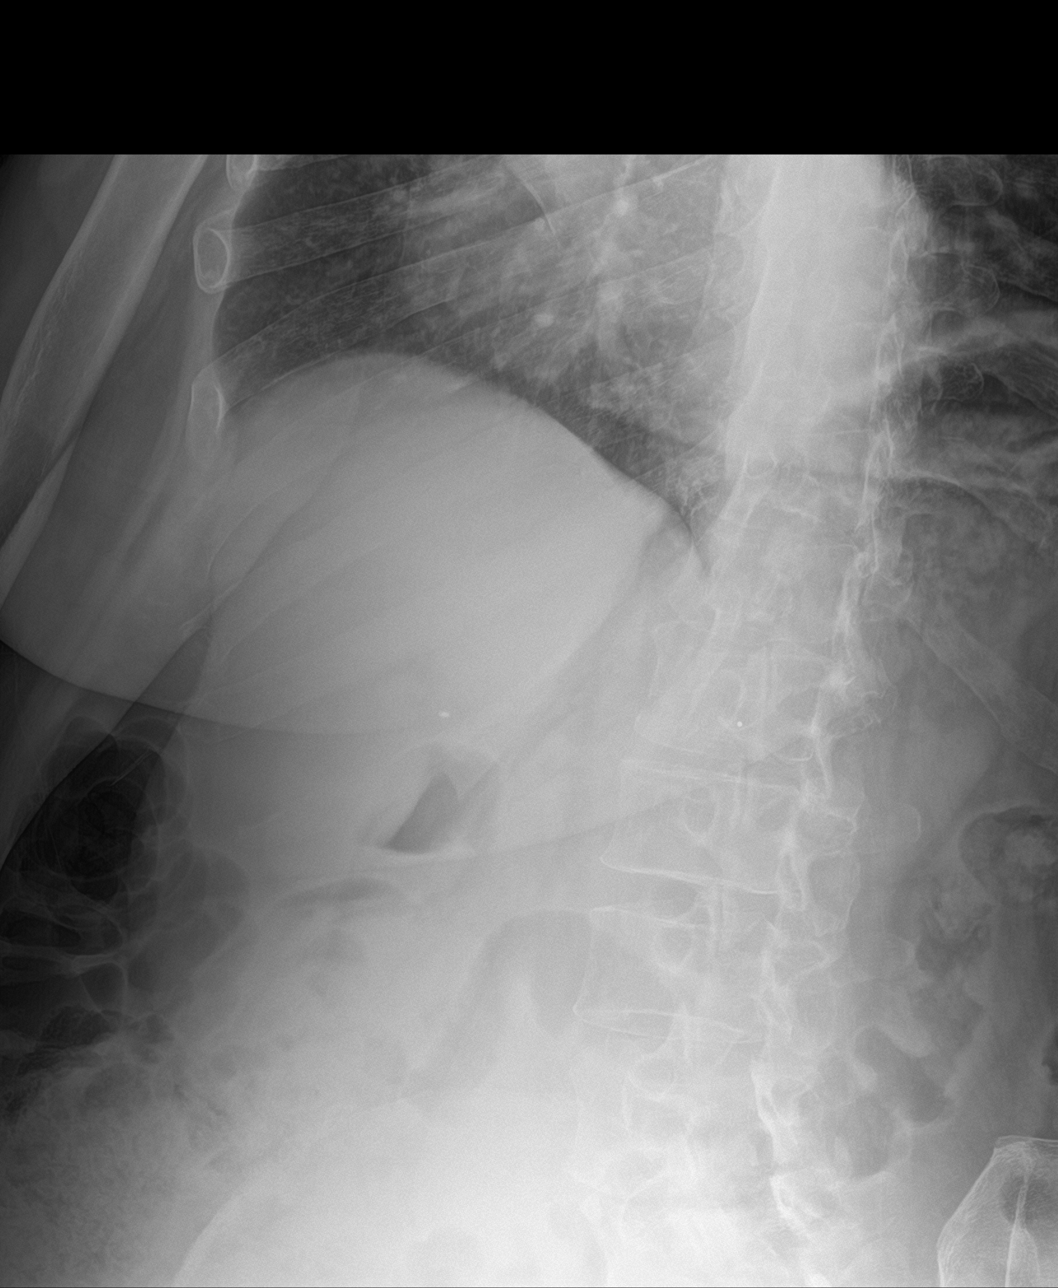

[3 of 3 positions shown; findings below may reference images not displayed]

FINDINGS: Heart size and mediastinal contours are within normal limits,
stable. Lungs appear clear. No pleural effusion or pneumothorax
seen.

Osseous structures about the chest are unremarkable. No right-sided
rib fracture or displacement identified.
IMPRESSION: Negative.

## 2021-03-10 ENCOUNTER — Other Ambulatory Visit (HOSPITAL_COMMUNITY): Payer: Self-pay | Admitting: Internal Medicine

## 2021-03-10 ENCOUNTER — Other Ambulatory Visit: Payer: Self-pay | Admitting: Internal Medicine

## 2021-03-11 ENCOUNTER — Other Ambulatory Visit: Payer: Self-pay | Admitting: Internal Medicine

## 2021-03-11 ENCOUNTER — Other Ambulatory Visit (HOSPITAL_COMMUNITY): Payer: Self-pay | Admitting: Internal Medicine

## 2021-03-11 DIAGNOSIS — R042 Hemoptysis: Secondary | ICD-10-CM

## 2021-03-12 ENCOUNTER — Ambulatory Visit (HOSPITAL_COMMUNITY): Payer: Medicare HMO

## 2021-03-12 ENCOUNTER — Encounter (HOSPITAL_COMMUNITY): Payer: Self-pay

## 2021-03-17 ENCOUNTER — Ambulatory Visit (INDEPENDENT_AMBULATORY_CARE_PROVIDER_SITE_OTHER): Payer: Medicare HMO | Admitting: Pulmonary Disease

## 2021-03-17 ENCOUNTER — Encounter: Payer: Self-pay | Admitting: Pulmonary Disease

## 2021-03-17 ENCOUNTER — Other Ambulatory Visit: Payer: Self-pay

## 2021-03-17 DIAGNOSIS — J189 Pneumonia, unspecified organism: Secondary | ICD-10-CM

## 2021-03-17 DIAGNOSIS — J441 Chronic obstructive pulmonary disease with (acute) exacerbation: Secondary | ICD-10-CM

## 2021-03-17 NOTE — Assessment & Plan Note (Addendum)
He has purulent sputum production.  He has been treated with Levaquin for 7 days but continues to have symptoms and hemoptysis.  He is on Brilinta for stents in his heart He has a history of MRSA infection in the past.  It will be difficult to obtain a sputum culture.  He may need to be treated with IV vancomycin empirically depending on imaging findings We will review his lab and chest x-rays and obtain CT chest without contrast -further recommendations will be based on CT . If hemoptysis continues we may have to pause Brilinta for a few days. He had left lower lobe nodules which have deemed to be stable since 2019 so I doubt lung malignancy here but that is possible in this heavy smoker  Addendum -reviewed labs from [facility], no leukocytosis. Reviewed chest x-ray from 02/26/2021 which shows right lower lobe infiltrate and follow-up chest x-ray from 1/17 which shows minimal right basilar atelectasis. Will await CT scan

## 2021-03-17 NOTE — Assessment & Plan Note (Signed)
Continue on Bevespi.  If he develops increased bronchospasm, he will need a course of steroids. I have encouraged him to use albuterol nebs 4 times a day while he recovers from this illness

## 2021-03-17 NOTE — Progress Notes (Signed)
Subjective:    Patient ID: [name], male    DOB: November 15, 1955, 66 y.o.   MRN: [id number]  HPI  Chief Complaint  Patient presents with   Consult    Consult for coughing up blood. Pt states it has been occurring for 2 weeks now.    66 year old smoker, nursing home resident with bipolar disorder and schizophrenia presents for evaluation of hemoptysis. He was a resident of [facility] home for many years, he was hospitalized for 13 days for left lower lobe pneumonia 2 months ago and since then he has been living at [facility]. I was unable to obtain these records in [place] but was able to review that he has been hospitalized in February 2022 with left upper lobe pneumonia, septic shock and AKI.  CT chest at that time showed stable left lower lobe nodules compared to 2019 He is maintained on Brilinta for multiple cardiac stents.  He reports that for the last 2 weeks he has been coughing up blood, no URI symptoms at onset.  Occasionally this is bright red blood and at other times he brings up an occasional blood clot.  He also reports yellow sputum.  He was treated with Levaquin for 7 days on review of nursing home records.  Denies fevers, sick contacts. He has a history of MRSA infection in the past   He reports mild shortness of breath.  This is relieved by albuterol neb.  He is also maintained on a regimen of Bevespi  History is sketchy and is obtained by reviewing nursing home records and care everywhere.  I note that a CT chest was ordered for 1/20 but could not be authorized by insurance  Significant tests/ events reviewed  03/2020 CT chest >> left upper lobe airspace disease, stable nodules since 2019, 1 cm left upper lobe, 0.7 cm left lower lobe  09/2014 CTA chest >> Nodular lesions are again noted in the left lower lobe, largest  8 x 7 mm, stable from 06/2014    PMH - Type II DM HTN HFpEF CAD s/p PCI Schizophrenia, bipolar  Hypothyroidism MRSA  bacteremia  Past Medical History:  Diagnosis Date   Anginal pain ([place])    Anxiety    Arthritis    "right hip; herniated L4-5" (11/05/2015)   Asthma    Bipolar 1 disorder (HCC)    CHF (congestive heart failure) (HCC)    Chronic bronchitis (HCC)    Chronic lower back pain    Chronic pain disorder    Community acquired pneumonia 10/30/2016   COPD (chronic obstructive pulmonary disease) ([place])    Coronary artery disease    Coronary atherosclerosis 12/05/2008   Qualifier: Diagnosis of  By: Jaramillo, [place], HX OF 12/05/2008   Qualifier: Diagnosis of  By: Jaramillo, [place], MAJOR 12/05/2008   Qualifier: Diagnosis of  By: Sidney Ace     Diabetes mellitus type 2 with complications ([place]) 6/46/8032   Diverticulitis large intestine    DVT (deep venous thrombosis) ([place]) 1991   "left calf; after 1st knee scope"   EROSIVE GASTRITIS 12/05/2008   Qualifier: Diagnosis of  By: Sidney Ace     Fungal esophagitis    GERD 12/05/2008   Qualifier: Diagnosis of  By: Sidney Ace     GERD (gastroesophageal reflux disease)    Heart attack ([place]) 2010   BMS x 2 RCA   High cholesterol    Hyperlipidemia  Hypertension    Hypothyroidism    MRSA (methicillin resistant staph aureus) culture positive    Myocardial infarction ([place])    MYOCARDIAL INFARCTION, HX OF 12/05/2008   Qualifier: Diagnosis of  By: Sidney Ace     Obesity 12/05/2008   Qualifier: Diagnosis of  By: Sidney Ace     Pneumonia    "several times"   PULMONARY NODULE, LEFT LOWER LOBE 12/05/2008   Qualifier: Diagnosis of  By: Sidney Ace     S/P angioplasty with stent 11/05/15 PCI DES to mLAD  11/06/2015   Schizo affective schizophrenia ([place]) 2002   SOB (shortness of breath) 08/22/2016   Status post cholecystectomy 09/25/2014    Past Surgical History:  Procedure Laterality Date   APPENDECTOMY     CARDIAC CATHETERIZATION N/A 11/05/2015   Procedure: Left Heart Cath and Coronary  Angiography;  Surgeon: Nelva Bush, MD;  Location: [facility];  Service: Cardiovascular;  Laterality: N/A;   CARDIAC CATHETERIZATION N/A 11/05/2015   Procedure: Coronary Stent Intervention;  Surgeon: Nelva Bush, MD;  Location: [facility];  Service: Cardiovascular;  Laterality: N/A;   CARDIAC CATHETERIZATION N/A 11/05/2015   Procedure: Intravascular Pressure Wire/FFR Study;  Surgeon: Nelva Bush, MD;  Location: [facility];  Service: Cardiovascular;  Laterality: N/A;   CORONARY ANGIOPLASTY WITH STENT PLACEMENT  2010   90% RCA s/p BMS x 2, 50% LAD, 30% CFX, EF 45-50%   CORONARY ANGIOPLASTY WITH STENT PLACEMENT  11/05/2015   "1 today; makes a total of 4 stents" (11/05/2015)   INGUINAL HERNIA REPAIR Right    KNEE ARTHROSCOPY Left X 3   LAPAROSCOPIC CHOLECYSTECTOMY     TONSILLECTOMY AND ADENOIDECTOMY     UPPER GI ENDOSCOPY  05/24/2016   UGI ENDO INCLUDE ESOPHAGUS, STOMACH & DUODENUM &/OR JEJUNUM; DX W/WO COLLECTION SPECIMEN BY BRUSH OR [place]; SURGEON :ALBERT San Morelle, MD LOCATION; [facility]    Allergies  Allergen Reactions   Rocephin [Ceftriaxone Sodium In Dextrose] Anaphylaxis   Zofran [Ondansetron Hcl] Swelling    sts had tongue swelling and trouble breathing   Ampicillin    Lipitor [Atorvastatin]    Prochlorperazine Edisylate Other (See Comments)    Childhood Reaction.   Toradol [Ketorolac Tromethamine] Nausea And Vomiting    Pt states he is not allergic to ibuprofen   Tramadol Nausea And Vomiting    Social History   Socioeconomic History   Marital status: Divorced    Spouse name: Not on file   Number of children: Not on file   Years of education: Not on file   Highest education level: Not on file  Occupational History   Occupation: Disabled  Tobacco Use   Smoking status: Every Day    Years: 42.00    Types: Cigarettes   Smokeless tobacco: Never   Tobacco comments:    last attempt to quit 04/12/16, down to 6 cigs daily    Vaping Use   Vaping Use: Never used  Substance and Sexual Activity   Alcohol use: No   Drug use: No   Sexual activity: Yes  Other Topics Concern   Not on file  Social History Narrative   Lives alone in [place].   Ad[facility]   Divorced   Smokes daily   Alcohol none   Full Code   Social Determinants of Health   Financial Resource Strain: Not on file  Food Insecurity: Not on file  Transportation Needs: Not on file  Physical Activity: Not on file  Stress: Not on file  Social Connections: Not on file  Intimate Partner Violence: Not on file     Family History  Problem Relation Age of Onset   Heart Problems Mother        59   Heart disease Father        67     Review of Systems Constitutional: negative for anorexia, fevers and sweats  Eyes: negative for irritation, redness and visual disturbance  Ears, nose, mouth, throat, and face: negative for earaches, epistaxis, nasal congestion and sore throat   Cardiovascular: negative for chest pain,  lower extremity edema, orthopnea, palpitations and syncope  Gastrointestinal: negative for abdominal pain, constipation, diarrhea, melena, nausea and vomiting  Genitourinary:negative for dysuria, frequency and hematuria  Hematologic/lymphatic: negative for bleeding, easy bruising and lymphadenopathy  Musculoskeletal:negative for arthralgias, muscle weakness and stiff joints  Neurological: negative for coordination problems, gait problems, headaches and weakness  Endocrine: negative for diabetic symptoms including polydipsia, polyuria and weight loss     Objective:   Physical Exam  Gen. Pleasant, well-nourished, in no distress, flat affect ENT -edentulous, no pallor,icterus, no post nasal drip, no bleeding in the ears or mouth/gums Neck: No JVD, no thyromegaly, no carotid bruits Lungs: no use of accessory muscles, no dullness to percussion, scattered bilateral rhonchi  Cardiovascular:  Rhythm regular, heart sounds  normal, no murmurs or gallops, no peripheral edema Abdomen: soft and non-tender, no hepatosplenomegaly, BS normal. Musculoskeletal: No deformities, no cyanosis or clubbing Neuro:  alert, non focal       Assessment & Plan:

## 2021-03-17 NOTE — Patient Instructions (Addendum)
° °  X CT chest WO contrast for hemoptysis - based on this ,we will decide about the need for more antibiotics versus bronchoscopy  X Use albuterol MDI 2 puffs as needed for rescue  Continue on bevespi & albuterol nebs as needed

## 2021-03-30 ENCOUNTER — Telehealth: Payer: Self-pay | Admitting: *Deleted

## 2021-03-30 ENCOUNTER — Other Ambulatory Visit: Payer: Self-pay | Admitting: *Deleted

## 2021-03-30 DIAGNOSIS — R042 Hemoptysis: Secondary | ICD-10-CM

## 2021-03-30 NOTE — Telephone Encounter (Signed)
ATC patient regarding f/u appointment with Tammy [name].  LVM to return call.  He has not had the CT scan done yet and it looks like he has cancelled previously.  When he calls back, reschedule his appointment after CT has been done.

## 2021-03-30 NOTE — Telephone Encounter (Signed)
[place] and [facility], spoke with Rande Brunt, provide information regarding appointment for CT scan (04/05/2021, to arrive by 12:45 pm).  Advised to cancel transportation for appointment with our office on 03/31/2021 and rescheduled his appointment for 04/07/21 at 9:30 am, advised to arrive by 9:15 am.  She verbalized understanding.  Nothing further needed.

## 2021-03-30 NOTE — Telephone Encounter (Signed)
ATC patient again at [phone], no answer, left vm to return call.  ATC patient's daughter, listed in demographics Darden Flemister), reached a recording that my call could not be completed as dialed [phone])  [facility] and [facility] where patient is currently residing [phone]).  I spoke with Tamika and Rande Brunt with the facility regarding the need for the CT scan, I was advised that they need 1 days notice for transportation.  I let them know that we would get it scheduled and then would call them back with the date/time and schedule a f/u after the scan.  Bethena Roys stated to call back and ask for Rande Brunt.

## 2021-03-31 ENCOUNTER — Ambulatory Visit: Payer: Medicare HMO | Admitting: Adult Health

## 2021-04-05 ENCOUNTER — Other Ambulatory Visit: Payer: Self-pay

## 2021-04-05 ENCOUNTER — Ambulatory Visit (HOSPITAL_COMMUNITY)
Admission: RE | Admit: 2021-04-05 | Discharge: 2021-04-05 | Disposition: A | Discharge status: Home / Self Care | Payer: Medicare HMO | Source: Ambulatory Visit | Attending: Pulmonary Disease | Admitting: Pulmonary Disease

## 2021-04-05 DIAGNOSIS — R042 Hemoptysis: Secondary | ICD-10-CM | POA: Diagnosis present

## 2021-04-06 NOTE — Telephone Encounter (Signed)
Tried to reach him at [facility] for CT results but unable Please let him know that CT chest showed lung mass  Please schedule bronchoscopy as we had discussed during [place] for 2/24 Friday at [facility]  Please schedule the following:  Provider performing procedure:Jaquana Geiger Diagnosis: Lung mass  Which side if for nodule / mass? Right  Procedure: bronchoscopy with biopsy  Has patient been spoken to by Provider and given informed consent? Pending  Anesthesia: Propofol Do you need Fluro? No  Duration of procedure: 30 mins  Date: 2/24 ,  Alternate Date: 2/27,2/28, 3/1  Time: AM or  PM Location: [facility]  Does patient have OSA? N DM? Y Or Latex allergy? N Medication Restriction: HOLD Brilinta for 5 days prior to procedure  Pre-op Labs Ordered:determined by Anesthesia Imaging request: NA  (If, SuperDimension CT Chest, please have STAT courier sent to ENDO)  Please coordinate Pre-op COVID Testing

## 2021-04-07 ENCOUNTER — Ambulatory Visit (INDEPENDENT_AMBULATORY_CARE_PROVIDER_SITE_OTHER): Payer: Medicare HMO | Admitting: Adult Health

## 2021-04-07 ENCOUNTER — Encounter: Payer: Self-pay | Admitting: Adult Health

## 2021-04-07 ENCOUNTER — Other Ambulatory Visit: Payer: Self-pay

## 2021-04-07 VITALS — BP 118/62 | HR 71 | Temp 98.7°F | Ht 72.0 in | Wt 261.4 lb

## 2021-04-07 DIAGNOSIS — R042 Hemoptysis: Secondary | ICD-10-CM

## 2021-04-07 DIAGNOSIS — J449 Chronic obstructive pulmonary disease, unspecified: Secondary | ICD-10-CM | POA: Diagnosis not present

## 2021-04-07 DIAGNOSIS — J189 Pneumonia, unspecified organism: Secondary | ICD-10-CM

## 2021-04-07 DIAGNOSIS — F17200 Nicotine dependence, unspecified, uncomplicated: Secondary | ICD-10-CM

## 2021-04-07 DIAGNOSIS — R918 Other nonspecific abnormal finding of lung field: Secondary | ICD-10-CM | POA: Insufficient documentation

## 2021-04-07 NOTE — H&P (View-Only) (Signed)
$'@Patient'N$  ID: [name], male    DOB: 05-22-1955, 66 y.o.   MRN: [id number]  Chief Complaint  Patient presents with   Follow-up    Referring provider: Beckie Salts, MD  HPI: 66 year old male active smoker, nursing home resident, seen for pulmonary consult March 17, 2021 for evaluation of hemoptysis. Medical history significant for bipolar disorder, schizophrenia, COPD  TEST/EVENTS :   04/07/2021 Follow up : Hemoptysis  Patient presents for a 2-week follow-up.  Patient was seen last visit for pulmonary consult for ongoing intermittent hemoptysis.  Patient was hospitalized 2 months ago for 13 days for left lower lobe pneumonia.  He was treated with antibiotics.  Patient says since then he has had been coughing up intermittent blood.  Patient has been treated again with antibiotics with Levaquin.  Patient says since last visit he is doing some better with decreased cough.  He has had no hemoptysis for 1 week.  Patient was set up for a CT chest completed on April 05, 2021 that showed a 3.3 x 3.4 cm rounded masslike consolidation involving the right hilar region suspicious for malignancy.  Occlusion of the bronchus intermedius with partial right lower lobe postobstructive atelectasis/pneumonia.  Stable 9 mm left lower lobe pulmonary nodule.  Patient is accompanied by a caregiver from nursing home.  We went over his CT results.  Went over the need to evaluate further with bronchoscopy.  We went over bronchoscopy procedure and potential risk and benefit. Patient has underlying COPD.  Is currently on Bevespi twice daily.  He uses albuterol nebulizer couple times a day.  He continues to smoke.  We discussed smoking cessation. Patient says he has an active.  Is in the wheelchair most of the time.  He can only walk short distances due to back and hip and knee issues.  He has been in a nursing home for several years.  He is currently in a rehab center since his most recent hospitalization end of  last year.   Allergies  Allergen Reactions   Rocephin [Ceftriaxone Sodium In Dextrose] Anaphylaxis   Zofran [Ondansetron Hcl] Swelling    sts had tongue swelling and trouble breathing   Ampicillin    Lipitor [Atorvastatin]    Prochlorperazine Edisylate Other (See Comments)    Childhood Reaction.   Toradol [Ketorolac Tromethamine] Nausea And Vomiting    Pt states he is not allergic to ibuprofen   Tramadol Nausea And Vomiting    Immunization History  Administered Date(s) Administered   Influenza Nasal 11/23/2015   Influenza, Seasonal, Injecte, Preservative Fre 11/22/2010, 12/12/2012, 02/15/2014   Influenza,inj,Quad PF,6+ Mos 11/26/2012, 01/17/2015, 11/06/2015   Influenza-Unspecified 02/26/2014, 01/05/2021   PPD Test 07/01/2016   Pneumococcal Polysaccharide-23 02/22/2008, 11/26/2012, 12/12/2012    Past Medical History:  Diagnosis Date   Anginal pain ([place])    Anxiety    Arthritis    "right hip; herniated L4-5" (11/05/2015)   Asthma    Bipolar 1 disorder (HCC)    CHF (congestive heart failure) (HCC)    Chronic bronchitis (HCC)    Chronic lower back pain    Chronic pain disorder    Community acquired pneumonia 10/30/2016   COPD (chronic obstructive pulmonary disease) ([place])    Coronary artery disease    Coronary atherosclerosis 12/05/2008   Qualifier: Diagnosis of  By: Jaramillo, [place], HX OF 12/05/2008   Qualifier: Diagnosis of  By: Jaramillo, [place], MAJOR 12/05/2008   Qualifier: Diagnosis of  By: Sidney Ace     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/84/1660   Diverticulitis large intestine    DVT (deep venous thrombosis) ([place]) 1991   "left calf; after 1st knee scope"   EROSIVE GASTRITIS 12/05/2008   Qualifier: Diagnosis of  By: Sidney Ace     Fungal esophagitis    GERD 12/05/2008   Qualifier: Diagnosis of  By: Sidney Ace     GERD (gastroesophageal reflux disease)    Heart attack ([place]) 2010   BMS x  2 RCA   High cholesterol    Hyperlipidemia    Hypertension    Hypothyroidism    MRSA (methicillin resistant staph aureus) culture positive    Myocardial infarction ([place])    MYOCARDIAL INFARCTION, HX OF 12/05/2008   Qualifier: Diagnosis of  By: Sidney Ace     Obesity 12/05/2008   Qualifier: Diagnosis of  By: Sidney Ace     Pneumonia    "several times"   PULMONARY NODULE, LEFT LOWER LOBE 12/05/2008   Qualifier: Diagnosis of  By: Sidney Ace     S/P angioplasty with stent 11/05/15 PCI DES to mLAD  11/06/2015   Schizo affective schizophrenia ([place]) 2002   SOB (shortness of breath) 08/22/2016   Status post cholecystectomy 09/25/2014    Tobacco History: Social History   Tobacco Use  Smoking Status Every Day   Years: 42.00   Types: Cigarettes  Smokeless Tobacco Never  Tobacco Comments   last attempt to quit 04/12/16, down to 6 cigs daily    Ready to quit: No Counseling given: Yes Tobacco comments: last attempt to quit 04/12/16, down to 6 cigs daily    Outpatient Medications Prior to Visit  Medication Sig Dispense Refill   albuterol (VENTOLIN HFA) 108 (90 Base) MCG/ACT inhaler Inhale into the lungs. 2 puffs into lungs every 6 hours as needed for wheezing     allopurinol (ZYLOPRIM) 100 MG tablet Take 1 tablet by mouth daily.     amLODipine (NORVASC) 5 MG tablet Take 5 mg by mouth. Take one tablet daily     aspirin 81 MG EC tablet Take 1 tablet by mouth daily.     aspirin EC 81 MG tablet Take 81 mg by mouth daily.      cyclobenzaprine (FLEXERIL) 10 MG tablet Take 10 mg by mouth 3 (three) times daily as needed for muscle spasms.     furosemide (LASIX) 20 MG tablet Take 1 tablet by mouth daily.     gabapentin (NEURONTIN) 300 MG capsule Take 300 mg by mouth 3 (three) times daily.     Glucagon, rDNA, (GLUCAGON EMERGENCY) 1 MG KIT SMARTSIG:1 Milligram(s) IM Once PRN     HYDROcodone-acetaminophen (NORCO/VICODIN) 5-325 MG tablet Take 1 tablet by mouth every 6 (six) hours as needed.  10 tablet 0   ipratropium-albuterol (DUONEB) 0.5-2.5 (3) MG/3ML SOLN Take 3 mLs by nebulization. Inhale 3 ml by nebulization every four hours as needed     iron polysaccharides (NIFEREX) 150 MG capsule Take by mouth.     levothyroxine (SYNTHROID) 75 MCG tablet Take by mouth.     lisinopril (ZESTRIL) 20 MG tablet Take 1 tablet by mouth daily.     LORazepam (ATIVAN) 2 MG tablet Take 2 mg by mouth. Every 12 hrs. anxiety     metoprolol succinate (TOPROL-XL) 25 MG 24 hr tablet TAKE 1 TABLET(25 MG) BY MOUTH DAILY     Multiple Vitamin (MULTI-VITAMINS) TABS Take 1 tablet by mouth daily.  naloxone (NARCAN) nasal spray 4 mg/0.1 mL Place 1 spray into the nose once. May repeat in 2-5 minutes; call EMS immediately     nitroGLYCERIN (NITROSTAT) 0.4 MG SL tablet Place 0.4 mg under the tongue every 5 (five) minutes as needed for chest pain.      omeprazole (PRILOSEC) 20 MG capsule Take 20 mg by mouth 2 (two) times daily.      omeprazole (PRILOSEC) 20 MG capsule Take by mouth.     Oxycodone HCl 10 MG TABS Take 10 mg by mouth. Take one tablet every 6 hours as needed for pain     polyethylene glycol (MIRALAX / GLYCOLAX) packet Take 17 g by mouth daily as needed for mild constipation.      pravastatin (PRAVACHOL) 40 MG tablet Take 40 mg by mouth. Take one tablet nightly     promethazine (PHENERGAN) 25 MG tablet Take 25 mg by mouth. Take one tablet every 6 hours as needed for nausea/vomiting     QUEtiapine (SEROQUEL) 100 MG tablet Take by mouth.     QUEtiapine (SEROQUEL) 50 MG tablet Take 50 mg by mouth. Take one tablet two times a day     temazepam (RESTORIL) 30 MG capsule Take 30 mg by mouth at bedtime.     venlafaxine XR (EFFEXOR-XR) 75 MG 24 hr capsule Take by mouth.     Fluticasone-Salmeterol (ADVAIR) 250-50 MCG/DOSE AEPB Inhale 1 puff into the lungs 2 (two) times daily.      guaiFENesin (MUCINEX) 600 MG 12 hr tablet Take 600 mg by mouth 2 (two) times daily. End Date 12/24 (Patient not taking: Reported on  04/07/2021)     levothyroxine (SYNTHROID, LEVOTHROID) 25 MCG tablet Take 25 mcg by mouth daily at 6 (six) AM.  (Patient not taking: Reported on 04/07/2021)     lisinopril (PRINIVIL,ZESTRIL) 10 MG tablet Take 5 mg by mouth daily.  (Patient not taking: Reported on 04/07/2021)     venlafaxine (EFFEXOR) 75 MG tablet Take 75 mg by mouth daily.      No facility-administered medications prior to visit.     Review of Systems:   Constitutional:   No  weight loss, night sweats,  Fevers, chills,  +fatigue, or  lassitude.  HEENT:   No headaches,  Difficulty swallowing,  Tooth/dental problems, or  Sore throat,                No sneezing, itching, ear ache, nasal congestion, post nasal drip,   CV:  No chest pain,  Orthopnea, PND, swelling in lower extremities, anasarca, dizziness, palpitations, syncope.   GI  No heartburn, indigestion, abdominal pain, nausea, vomiting, diarrhea, change in bowel habits, loss of appetite, bloody stools.   Resp: .  No chest wall deformity  Skin: no rash or lesions.  GU: no dysuria, change in color of urine, no urgency or frequency.  No flank pain, no hematuria   MS:  + back pain.    Physical Exam  BP 118/62 (BP Location: Right Arm, Cuff Size: Normal)    Pulse 71    Temp 98.7 F (37.1 C) (Temporal)    Ht 6' (1.829 m)    Wt 261 lb 6.4 oz (118.6 kg)    SpO2 92%    BMI 35.45 kg/m   GEN: A/Ox3; pleasant , NAD, elderly in wheelchair   HEENT:  [place]/AT,  NOSE-clear, THROAT-clear, no lesions, no postnasal drip or exudate noted.   NECK:  Supple w/ fair ROM; no JVD; normal carotid impulses w/o  bruits; no thyromegaly or nodules palpated; no lymphadenopathy.    RESP few scattered rhonchi  no accessory muscle use, no dullness to percussion  CARD:  RRR, no m/r/g, tr  peripheral edema, pulses intact, no cyanosis or clubbing.  GI:   Soft & nt; nml bowel sounds; no organomegaly or masses detected.   Musco: Warm bil, no deformities or joint swelling noted.   Neuro: alert,  no focal deficits noted.    Skin: Warm, no lesions or rashes    Lab Results:      ProBNP No results found for: PROBNP  Imaging: CT Chest Wo Contrast  Result Date: 04/05/2021 CLINICAL DATA:  Hemoptysis. EXAM: CT CHEST WITHOUT CONTRAST TECHNIQUE: Multidetector CT imaging of the chest was performed following the standard protocol without IV contrast. RADIATION DOSE REDUCTION: This exam was performed according to the departmental dose-optimization program which includes automated exposure control, adjustment of the mA and/or kV according to patient size and/or use of iterative reconstruction technique. COMPARISON:  Chest CT dated 04/13/2020. FINDINGS: Evaluation of this exam is limited in the absence of intravenous contrast. Cardiovascular: There is no cardiomegaly or pericardial effusion. Advanced 3 vessel coronary vascular calcification. Mild atherosclerotic calcification of the aortic arch. No aneurysmal dilatation. The central pulmonary arteries are grossly unremarkable on this noncontrast CT. Mediastinum/Nodes: No mediastinal or left hilar adenopathy. The esophagus is grossly unremarkable. No mediastinal fluid collection. Lungs/Pleura: There is a 3.3 x 3.4 cm rounded masslike consolidation involving the right hilar/infrahilar region suspicious for malignancy. There is occlusion of the bronchus intermedius. There is partial consolidative changes of the right lower lobe which represents postobstructive atelectasis or pneumonia. Small scattered ground-glass densities noted in the right upper lobe which may represent areas of atelectasis. There is a 9 mm nodule in the left lower lobe (99/5). No pleural effusion pneumothorax. Upper Abdomen: Fatty liver.  Cholecystectomy. Musculoskeletal: Mild degenerative changes of the spine. No acute osseous pathology. IMPRESSION: 1. A 3.3 x 3.4 cm rounded masslike consolidation involving the right hilar/infrahilar region suspicious for malignancy. There is occlusion  of the bronchus intermedius with partial right lower lobe postobstructive atelectasis or pneumonia. Clinical correlation and multidisciplinary consult is advised. PET-CT or bronchoscopy may provide additional evaluation. 2. A 9 mm left lower lobe pulmonary nodule. 3. Fatty liver. 4. Aortic Atherosclerosis (ICD10-I70.0). Electronically Signed   By: Anner Crete M.D.   On: 04/05/2021 23:44      No flowsheet data found.  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  Patient will need to undergo bronchoscopy for tissue sampling. He has been set up for bronchoscopy on Friday, February 24.  He will need to arrive at 1030.  Procedure will be done at 12 noon.  Patient is aware to not eat after midnight.  He also will hold Brilinta for 5 days prior to procedure. Risk and benefit were discussed..  [name] discussed procedure with patient as well. Set up for PET scan  Plan  Patient Instructions  We have set you up for a Bronchoscopy on Friday 04/16/21 at [facility] at 1030am with [name]  Do not eat after midnight night before procedure.  Hold Brilinta 5 days prior to procedure. (Stop on 04/12/21)  Continue on BEVESPI 2 puffs Twice daily .  Albuterol inhaler or neb as needed.  Set up for PET scan.  Follow up with [name]  in 3-4 weeks with  PFTs and As needed .  Please contact office for sooner follow up if symptoms do not improve or worsen or seek emergency care.     HCAP (healthcare-associated pneumonia) Recent admission clinically appears to be improving after antibiotics.  No further antibiotics at this time.  COPD (chronic obstructive pulmonary disease) (HCC) COPD.  Needs PFTs. Continue on Bevespi twice daily.  Albuterol as needed Smoking cessation discussed  TOBACCO ABUSE Smoking cessation discussed  Hemoptysis Hemoptysis most likely secondary to right lung mass.  Bronchoscopy has been  set up.  No hemoptysis x1 week.  Patient is continue to monitor closely.    I spent   40 minutes dedicated to the care of this patient on the date of this encounter to include pre-visit review of records, face-to-face time with the patient discussing conditions above, post visit ordering of testing, clinical documentation with the electronic health record, making appropriate referrals as documented, and communicating necessary findings to members of the patients care team.   Rexene Edison, NP 04/07/2021    Independently examined pt, evaluated data & formulated above care plan with NP  I reviewed CT images with patient, explained finding of lung mass and implications of lung cancer.  I discussed risks and benefits of diagnostic testing including PET scan and bronchoscopy. Bronchoscopy was scheduled for 2/24 at 1230 at [facility] V. [name] MD

## 2021-04-07 NOTE — Progress Notes (Addendum)
$'@Patient'V$  ID: [name], male    DOB: February 03, 1956, 66 y.o.   MRN: [id number]  Chief Complaint  Patient presents with   Follow-up    Referring provider: Beckie Salts, MD  HPI: 66 year old male active smoker, nursing home resident, seen for pulmonary consult March 17, 2021 for evaluation of hemoptysis. Medical history significant for bipolar disorder, schizophrenia, COPD  TEST/EVENTS :   04/07/2021 Follow up : Hemoptysis  Patient presents for a 2-week follow-up.  Patient was seen last visit for pulmonary consult for ongoing intermittent hemoptysis.  Patient was hospitalized 2 months ago for 13 days for left lower lobe pneumonia.  He was treated with antibiotics.  Patient says since then he has had been coughing up intermittent blood.  Patient has been treated again with antibiotics with Levaquin.  Patient says since last visit he is doing some better with decreased cough.  He has had no hemoptysis for 1 week.  Patient was set up for a CT chest completed on April 05, 2021 that showed a 3.3 x 3.4 cm rounded masslike consolidation involving the right hilar region suspicious for malignancy.  Occlusion of the bronchus intermedius with partial right lower lobe postobstructive atelectasis/pneumonia.  Stable 9 mm left lower lobe pulmonary nodule.  Patient is accompanied by a caregiver from nursing home.  We went over his CT results.  Went over the need to evaluate further with bronchoscopy.  We went over bronchoscopy procedure and potential risk and benefit. Patient has underlying COPD.  Is currently on Bevespi twice daily.  He uses albuterol nebulizer couple times a day.  He continues to smoke.  We discussed smoking cessation. Patient says he has an active.  Is in the wheelchair most of the time.  He can only walk short distances due to back and hip and knee issues.  He has been in a nursing home for several years.  He is currently in a rehab center since his most recent hospitalization end of  last year.   Allergies  Allergen Reactions   Rocephin [Ceftriaxone Sodium In Dextrose] Anaphylaxis   Zofran [Ondansetron Hcl] Swelling    sts had tongue swelling and trouble breathing   Ampicillin    Lipitor [Atorvastatin]    Prochlorperazine Edisylate Other (See Comments)    Childhood Reaction.   Toradol [Ketorolac Tromethamine] Nausea And Vomiting    Pt states he is not allergic to ibuprofen   Tramadol Nausea And Vomiting    Immunization History  Administered Date(s) Administered   Influenza Nasal 11/23/2015   Influenza, Seasonal, Injecte, Preservative Fre 11/22/2010, 12/12/2012, 02/15/2014   Influenza,inj,Quad PF,6+ Mos 11/26/2012, 01/17/2015, 11/06/2015   Influenza-Unspecified 02/26/2014, 01/05/2021   PPD Test 07/01/2016   Pneumococcal Polysaccharide-23 02/22/2008, 11/26/2012, 12/12/2012    Past Medical History:  Diagnosis Date   Anginal pain ([place])    Anxiety    Arthritis    "right hip; herniated L4-5" (11/05/2015)   Asthma    Bipolar 1 disorder (HCC)    CHF (congestive heart failure) (HCC)    Chronic bronchitis (HCC)    Chronic lower back pain    Chronic pain disorder    Community acquired pneumonia 10/30/2016   COPD (chronic obstructive pulmonary disease) ([place])    Coronary artery disease    Coronary atherosclerosis 12/05/2008   Qualifier: Diagnosis of  By: Jaramillo, [place], HX OF 12/05/2008   Qualifier: Diagnosis of  By: Jaramillo, [place], MAJOR 12/05/2008   Qualifier: Diagnosis of  By: Sidney Ace     Diabetes mellitus type 2 with complications ([place]) 0/17/5102   Diverticulitis large intestine    DVT (deep venous thrombosis) ([place]) 1991   "left calf; after 1st knee scope"   EROSIVE GASTRITIS 12/05/2008   Qualifier: Diagnosis of  By: Sidney Ace     Fungal esophagitis    GERD 12/05/2008   Qualifier: Diagnosis of  By: Sidney Ace     GERD (gastroesophageal reflux disease)    Heart attack ([place]) 2010   BMS x  2 RCA   High cholesterol    Hyperlipidemia    Hypertension    Hypothyroidism    MRSA (methicillin resistant staph aureus) culture positive    Myocardial infarction ([place])    MYOCARDIAL INFARCTION, HX OF 12/05/2008   Qualifier: Diagnosis of  By: Sidney Ace     Obesity 12/05/2008   Qualifier: Diagnosis of  By: Sidney Ace     Pneumonia    "several times"   PULMONARY NODULE, LEFT LOWER LOBE 12/05/2008   Qualifier: Diagnosis of  By: Sidney Ace     S/P angioplasty with stent 11/05/15 PCI DES to mLAD  11/06/2015   Schizo affective schizophrenia ([place]) 2002   SOB (shortness of breath) 08/22/2016   Status post cholecystectomy 09/25/2014    Tobacco History: Social History   Tobacco Use  Smoking Status Every Day   Years: 42.00   Types: Cigarettes  Smokeless Tobacco Never  Tobacco Comments   last attempt to quit 04/12/16, down to 6 cigs daily    Ready to quit: No Counseling given: Yes Tobacco comments: last attempt to quit 04/12/16, down to 6 cigs daily    Outpatient Medications Prior to Visit  Medication Sig Dispense Refill   albuterol (VENTOLIN HFA) 108 (90 Base) MCG/ACT inhaler Inhale into the lungs. 2 puffs into lungs every 6 hours as needed for wheezing     allopurinol (ZYLOPRIM) 100 MG tablet Take 1 tablet by mouth daily.     amLODipine (NORVASC) 5 MG tablet Take 5 mg by mouth. Take one tablet daily     aspirin 81 MG EC tablet Take 1 tablet by mouth daily.     aspirin EC 81 MG tablet Take 81 mg by mouth daily.      cyclobenzaprine (FLEXERIL) 10 MG tablet Take 10 mg by mouth 3 (three) times daily as needed for muscle spasms.     furosemide (LASIX) 20 MG tablet Take 1 tablet by mouth daily.     gabapentin (NEURONTIN) 300 MG capsule Take 300 mg by mouth 3 (three) times daily.     Glucagon, rDNA, (GLUCAGON EMERGENCY) 1 MG KIT SMARTSIG:1 Milligram(s) IM Once PRN     HYDROcodone-acetaminophen (NORCO/VICODIN) 5-325 MG tablet Take 1 tablet by mouth every 6 (six) hours as needed.  10 tablet 0   ipratropium-albuterol (DUONEB) 0.5-2.5 (3) MG/3ML SOLN Take 3 mLs by nebulization. Inhale 3 ml by nebulization every four hours as needed     iron polysaccharides (NIFEREX) 150 MG capsule Take by mouth.     levothyroxine (SYNTHROID) 75 MCG tablet Take by mouth.     lisinopril (ZESTRIL) 20 MG tablet Take 1 tablet by mouth daily.     LORazepam (ATIVAN) 2 MG tablet Take 2 mg by mouth. Every 12 hrs. anxiety     metoprolol succinate (TOPROL-XL) 25 MG 24 hr tablet TAKE 1 TABLET(25 MG) BY MOUTH DAILY     Multiple Vitamin (MULTI-VITAMINS) TABS Take 1 tablet by mouth daily.  naloxone (NARCAN) nasal spray 4 mg/0.1 mL Place 1 spray into the nose once. May repeat in 2-5 minutes; call EMS immediately     nitroGLYCERIN (NITROSTAT) 0.4 MG SL tablet Place 0.4 mg under the tongue every 5 (five) minutes as needed for chest pain.      omeprazole (PRILOSEC) 20 MG capsule Take 20 mg by mouth 2 (two) times daily.      omeprazole (PRILOSEC) 20 MG capsule Take by mouth.     Oxycodone HCl 10 MG TABS Take 10 mg by mouth. Take one tablet every 6 hours as needed for pain     polyethylene glycol (MIRALAX / GLYCOLAX) packet Take 17 g by mouth daily as needed for mild constipation.      pravastatin (PRAVACHOL) 40 MG tablet Take 40 mg by mouth. Take one tablet nightly     promethazine (PHENERGAN) 25 MG tablet Take 25 mg by mouth. Take one tablet every 6 hours as needed for nausea/vomiting     QUEtiapine (SEROQUEL) 100 MG tablet Take by mouth.     QUEtiapine (SEROQUEL) 50 MG tablet Take 50 mg by mouth. Take one tablet two times a day     temazepam (RESTORIL) 30 MG capsule Take 30 mg by mouth at bedtime.     venlafaxine XR (EFFEXOR-XR) 75 MG 24 hr capsule Take by mouth.     Fluticasone-Salmeterol (ADVAIR) 250-50 MCG/DOSE AEPB Inhale 1 puff into the lungs 2 (two) times daily.      guaiFENesin (MUCINEX) 600 MG 12 hr tablet Take 600 mg by mouth 2 (two) times daily. End Date 12/24 (Patient not taking: Reported on  04/07/2021)     levothyroxine (SYNTHROID, LEVOTHROID) 25 MCG tablet Take 25 mcg by mouth daily at 6 (six) AM.  (Patient not taking: Reported on 04/07/2021)     lisinopril (PRINIVIL,ZESTRIL) 10 MG tablet Take 5 mg by mouth daily.  (Patient not taking: Reported on 04/07/2021)     venlafaxine (EFFEXOR) 75 MG tablet Take 75 mg by mouth daily.      No facility-administered medications prior to visit.     Review of Systems:   Constitutional:   No  weight loss, night sweats,  Fevers, chills,  +fatigue, or  lassitude.  HEENT:   No headaches,  Difficulty swallowing,  Tooth/dental problems, or  Sore throat,                No sneezing, itching, ear ache, nasal congestion, post nasal drip,   CV:  No chest pain,  Orthopnea, PND, swelling in lower extremities, anasarca, dizziness, palpitations, syncope.   GI  No heartburn, indigestion, abdominal pain, nausea, vomiting, diarrhea, change in bowel habits, loss of appetite, bloody stools.   Resp: .  No chest wall deformity  Skin: no rash or lesions.  GU: no dysuria, change in color of urine, no urgency or frequency.  No flank pain, no hematuria   MS:  + back pain.    Physical Exam  BP 118/62 (BP Location: Right Arm, Cuff Size: Normal)    Pulse 71    Temp 98.7 F (37.1 C) (Temporal)    Ht 6' (1.829 m)    Wt 261 lb 6.4 oz (118.6 kg)    SpO2 92%    BMI 35.45 kg/m   GEN: A/Ox3; pleasant , NAD, elderly in wheelchair   HEENT:  [place]/AT,  NOSE-clear, THROAT-clear, no lesions, no postnasal drip or exudate noted.   NECK:  Supple w/ fair ROM; no JVD; normal carotid impulses w/o  bruits; no thyromegaly or nodules palpated; no lymphadenopathy.    RESP few scattered rhonchi  no accessory muscle use, no dullness to percussion  CARD:  RRR, no m/r/g, tr  peripheral edema, pulses intact, no cyanosis or clubbing.  GI:   Soft & nt; nml bowel sounds; no organomegaly or masses detected.   Musco: Warm bil, no deformities or joint swelling noted.   Neuro: alert,  no focal deficits noted.    Skin: Warm, no lesions or rashes    Lab Results:      ProBNP No results found for: PROBNP  Imaging: CT Chest Wo Contrast  Result Date: 04/05/2021 CLINICAL DATA:  Hemoptysis. EXAM: CT CHEST WITHOUT CONTRAST TECHNIQUE: Multidetector CT imaging of the chest was performed following the standard protocol without IV contrast. RADIATION DOSE REDUCTION: This exam was performed according to the departmental dose-optimization program which includes automated exposure control, adjustment of the mA and/or kV according to patient size and/or use of iterative reconstruction technique. COMPARISON:  Chest CT dated 04/13/2020. FINDINGS: Evaluation of this exam is limited in the absence of intravenous contrast. Cardiovascular: There is no cardiomegaly or pericardial effusion. Advanced 3 vessel coronary vascular calcification. Mild atherosclerotic calcification of the aortic arch. No aneurysmal dilatation. The central pulmonary arteries are grossly unremarkable on this noncontrast CT. Mediastinum/Nodes: No mediastinal or left hilar adenopathy. The esophagus is grossly unremarkable. No mediastinal fluid collection. Lungs/Pleura: There is a 3.3 x 3.4 cm rounded masslike consolidation involving the right hilar/infrahilar region suspicious for malignancy. There is occlusion of the bronchus intermedius. There is partial consolidative changes of the right lower lobe which represents postobstructive atelectasis or pneumonia. Small scattered ground-glass densities noted in the right upper lobe which may represent areas of atelectasis. There is a 9 mm nodule in the left lower lobe (99/5). No pleural effusion pneumothorax. Upper Abdomen: Fatty liver.  Cholecystectomy. Musculoskeletal: Mild degenerative changes of the spine. No acute osseous pathology. IMPRESSION: 1. A 3.3 x 3.4 cm rounded masslike consolidation involving the right hilar/infrahilar region suspicious for malignancy. There is occlusion  of the bronchus intermedius with partial right lower lobe postobstructive atelectasis or pneumonia. Clinical correlation and multidisciplinary consult is advised. PET-CT or bronchoscopy may provide additional evaluation. 2. A 9 mm left lower lobe pulmonary nodule. 3. Fatty liver. 4. Aortic Atherosclerosis (ICD10-I70.0). Electronically Signed   By: Anner Crete M.D.   On: 04/05/2021 23:44      No flowsheet data found.  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  Patient will need to undergo bronchoscopy for tissue sampling. He has been set up for bronchoscopy on Friday, February 24.  He will need to arrive at 1030.  Procedure will be done at 12 noon.  Patient is aware to not eat after midnight.  He also will hold Brilinta for 5 days prior to procedure. Risk and benefit were discussed..  [name] discussed procedure with patient as well. Set up for PET scan  Plan  Patient Instructions  We have set you up for a Bronchoscopy on Friday 04/16/21 at [facility] at 1030am with [name]  Do not eat after midnight night before procedure.  Hold Brilinta 5 days prior to procedure. (Stop on 04/12/21)  Continue on BEVESPI 2 puffs Twice daily .  Albuterol inhaler or neb as needed.  Set up for PET scan.  Follow up with [name]  in 3-4 weeks with  PFTs and As needed .  Please contact office for sooner follow up if symptoms do not improve or worsen or seek emergency care.     HCAP (healthcare-associated pneumonia) Recent admission clinically appears to be improving after antibiotics.  No further antibiotics at this time.  COPD (chronic obstructive pulmonary disease) (HCC) COPD.  Needs PFTs. Continue on Bevespi twice daily.  Albuterol as needed Smoking cessation discussed  TOBACCO ABUSE Smoking cessation discussed  Hemoptysis Hemoptysis most likely secondary to right lung mass.  Bronchoscopy has been  set up.  No hemoptysis x1 week.  Patient is continue to monitor closely.    I spent   40 minutes dedicated to the care of this patient on the date of this encounter to include pre-visit review of records, face-to-face time with the patient discussing conditions above, post visit ordering of testing, clinical documentation with the electronic health record, making appropriate referrals as documented, and communicating necessary findings to members of the patients care team.   Rexene Edison, NP 04/07/2021    Independently examined pt, evaluated data & formulated above care plan with NP  I reviewed CT images with patient, explained finding of lung mass and implications of lung cancer.  I discussed risks and benefits of diagnostic testing including PET scan and bronchoscopy. Bronchoscopy was scheduled for 2/24 at 1230 at [facility]. [name] MD

## 2021-04-07 NOTE — Assessment & Plan Note (Signed)
COPD.  Needs PFTs. Continue on Bevespi twice daily.  Albuterol as needed Smoking cessation discussed

## 2021-04-07 NOTE — Assessment & Plan Note (Addendum)
3.4 cm right lung mass suspicious for malignancy.  Patient will need to undergo bronchoscopy for tissue sampling. He has been set up for bronchoscopy on Friday, February 24.  He will need to arrive at 1030.  Procedure will be done at 12 noon.  Patient is aware to not eat after midnight.  He also will hold Brilinta for 5 days prior to procedure. Risk and benefit were discussed..  [name] discussed procedure with patient as well. Set up for PET scan  Plan  Patient Instructions  We have set you up for a Bronchoscopy on Friday 04/16/21 at [facility] at 1030am with [name]  Do not eat after midnight night before procedure.  Hold Brilinta 5 days prior to procedure. (Stop on 04/12/21)  Continue on BEVESPI 2 puffs Twice daily .  Albuterol inhaler or neb as needed.  Set up for PET scan.  Follow up with [name]  in 3-4 weeks with PFTs and As needed .  Please contact office for sooner follow up if symptoms do not improve or worsen or seek emergency care.

## 2021-04-07 NOTE — Assessment & Plan Note (Signed)
Smoking cessation discussed 

## 2021-04-07 NOTE — Assessment & Plan Note (Signed)
Recent admission clinically appears to be improving after antibiotics.  No further antibiotics at this time.

## 2021-04-07 NOTE — Assessment & Plan Note (Signed)
Hemoptysis most likely secondary to right lung mass.  Bronchoscopy has been set up.  No hemoptysis x1 week.  Patient is continue to monitor closely.

## 2021-04-07 NOTE — Telephone Encounter (Signed)
Patient is in the office now for an appt and has been given all the information about bronch. Nothing further needed at this time.

## 2021-04-07 NOTE — Patient Instructions (Addendum)
We have set you up for a Bronchoscopy on Friday 04/16/21 at [facility] at 10:30am with [name]  Do not eat after midnight night before procedure.  Hold Brilinta 5 days prior to procedure. (Stop on 04/12/21)  Continue on BEVESPI 2 puffs Twice daily .  Albuterol inhaler or neb as needed.  Set up for PET scan.  Follow up with [name]  in 3-4 weeks with PFTs and As needed .  Please contact office for sooner follow up if symptoms do not improve or worsen or seek emergency care.

## 2021-04-08 ENCOUNTER — Encounter (HOSPITAL_COMMUNITY): Payer: Self-pay | Admitting: Pulmonary Disease

## 2021-04-15 ENCOUNTER — Other Ambulatory Visit: Payer: Medicare HMO

## 2021-04-16 ENCOUNTER — Ambulatory Visit (HOSPITAL_COMMUNITY): Payer: Medicare HMO | Admitting: Anesthesiology

## 2021-04-16 ENCOUNTER — Other Ambulatory Visit: Payer: Self-pay

## 2021-04-16 ENCOUNTER — Encounter (HOSPITAL_COMMUNITY): Payer: Self-pay | Admitting: Pulmonary Disease

## 2021-04-16 ENCOUNTER — Telehealth: Payer: Self-pay | Admitting: Pulmonary Disease

## 2021-04-16 ENCOUNTER — Ambulatory Visit (HOSPITAL_COMMUNITY)
Admission: RE | Admit: 2021-04-16 | Discharge: 2021-04-16 | Disposition: A | Discharge status: Skilled Nursing Facility | Payer: Medicare HMO | Attending: Pulmonary Disease | Admitting: Pulmonary Disease

## 2021-04-16 ENCOUNTER — Encounter (HOSPITAL_COMMUNITY)
Admission: RE | Disposition: A | Discharge status: Skilled Nursing Facility | Payer: Self-pay | Source: Home / Self Care | Attending: Pulmonary Disease

## 2021-04-16 DIAGNOSIS — I252 Old myocardial infarction: Secondary | ICD-10-CM | POA: Insufficient documentation

## 2021-04-16 DIAGNOSIS — J449 Chronic obstructive pulmonary disease, unspecified: Secondary | ICD-10-CM

## 2021-04-16 DIAGNOSIS — Z955 Presence of coronary angioplasty implant and graft: Secondary | ICD-10-CM | POA: Diagnosis not present

## 2021-04-16 DIAGNOSIS — Z538 Procedure and treatment not carried out for other reasons: Secondary | ICD-10-CM | POA: Diagnosis not present

## 2021-04-16 DIAGNOSIS — F17210 Nicotine dependence, cigarettes, uncomplicated: Secondary | ICD-10-CM | POA: Diagnosis not present

## 2021-04-16 DIAGNOSIS — Z7902 Long term (current) use of antithrombotics/antiplatelets: Secondary | ICD-10-CM | POA: Insufficient documentation

## 2021-04-16 DIAGNOSIS — Z716 Tobacco abuse counseling: Secondary | ICD-10-CM | POA: Diagnosis not present

## 2021-04-16 DIAGNOSIS — Z7951 Long term (current) use of inhaled steroids: Secondary | ICD-10-CM | POA: Insufficient documentation

## 2021-04-16 DIAGNOSIS — R918 Other nonspecific abnormal finding of lung field: Secondary | ICD-10-CM | POA: Insufficient documentation

## 2021-04-16 DIAGNOSIS — I110 Hypertensive heart disease with heart failure: Secondary | ICD-10-CM | POA: Diagnosis not present

## 2021-04-16 DIAGNOSIS — I509 Heart failure, unspecified: Secondary | ICD-10-CM | POA: Diagnosis not present

## 2021-04-16 DIAGNOSIS — Z20822 Contact with and (suspected) exposure to covid-19: Secondary | ICD-10-CM | POA: Diagnosis not present

## 2021-04-16 DIAGNOSIS — R042 Hemoptysis: Secondary | ICD-10-CM | POA: Diagnosis present

## 2021-04-16 LAB — SARS CORONAVIRUS 2 BY RT PCR (HOSPITAL ORDER, PERFORMED IN ~~LOC~~ HOSPITAL LAB): SARS Coronavirus 2: NEGATIVE

## 2021-04-16 SURGERY — CANCELLED PROCEDURE

## 2021-04-16 NOTE — Progress Notes (Signed)
Pt presented to endoscopy department today for Video bronchoscopy without fluoro with Dr [name]. Pt did not have cardiology clearance for procedure. Procedure cancelled per [facility] Lissa Hoard and MD [name].

## 2021-04-16 NOTE — Telephone Encounter (Signed)
Found a Dr. Sarita Haver with [facility]. Urgent referral for clearance has been placed.

## 2021-04-16 NOTE — Anesthesia Preprocedure Evaluation (Addendum)
Anesthesia Evaluation    Reviewed: Allergy & Precautions, Patient's Chart, lab work & pertinent test results, Unable to perform ROS - Chart review only  Airway        Dental   Pulmonary asthma , pneumonia, COPD, Current Smoker,           Cardiovascular hypertension, + angina + CAD, + Past MI, + Cardiac Stents and +CHF    LHC 2017 1. Multivessel coronary artery disease, including 40% ostial/proximal and 50-60% mid LAD stenoses (FFR 0.66), 60% ostial OM1 stenosis, diffuse disease of the mid RCA up to 40% in the distal vessel. 2. Patent stents in the ostial, proximal, and mid RCA with mild in-stent restenosis up to 20%. 3. Normal left ventricular contraction LVEF>65%). 4. Normal left ventricular filling pressure (LVEDP 12 mmHg). 5.  Successful FFR-guided PCI to mid LAD with placement of a Xience Alpine 3.0 x 18 mm drug-eluting stent with 0% residual stenosis and TIMI-3 flow.  Recommendations: 1.  Dual antiplatelet therapy with aspirin and clopidogrel for 12 months. 2.  Aggressive secondary prevention; will increase statin to atorvastatin 80 mg daily. 3.  Medical management of chest pain and evaluation for potential non-coronary etiologies of pain, given lack of obstructive CAD at the end of the procedure and normal EKG.    Neuro/Psych PSYCHIATRIC DISORDERS Anxiety Depression Bipolar Disorder Schizophrenia  Neuromuscular disease    GI/Hepatic Neg liver ROS, GERD  ,  Endo/Other  diabetesHypothyroidism   Renal/GU negative Renal ROS     Musculoskeletal  (+) Arthritis ,   Abdominal   Peds  Hematology negative hematology ROS (+)   Anesthesia Other Findings   Reproductive/Obstetrics                            Anesthesia Physical Anesthesia Plan  ASA:   Anesthesia Plan:    Post-op Pain Management:    Induction:   PONV Risk Score and Plan:   Airway Management Planned:   Additional Equipment:    Intra-op Plan:   Post-operative Plan:   Informed Consent:   Plan Discussed with:   Anesthesia Plan Comments: (Pt with no exercise tolerance (can walk with assistance for ~30 feet) and gets significant SOB. No followup with cardiology and no effort was made to have him evaluated prior to this procedure. It does not appear that the patient was evaluated by PAT.   I discussed this patient in depth with [name]. I feel strongly that, based on his cardiac history, his lack of exercise tolerance, and his lack of follow up with cardiology, cardiac evaluation his clearly necessary. [name] stated that this is a "low risk" procedure and that cardiology evaluation isn't necessary. I said that may be the case if he had the expected yearly, at minimum, follow up. Yet no one could provide any cardiology evaluation within >2 years. Pt apparently had a LHC in 2019 at an [facility] facility. Last procedure here (LHC) was 2017. [name] also was the physician who stopped the patient's brilenta. Without cardiology evaluation, risk stratification, and recommendations regarding his antiplatelet therapy, all in the setting of known multivessel CAD, HF and poor exercise tolerance I believe it is unreasonable to proceed with GETA today. )      Anesthesia Quick Evaluation

## 2021-04-16 NOTE — Interval H&P Note (Signed)
History and Physical Interval Note:  04/16/2021 11:56 AM  [name]  has presented today for surgery, with the diagnosis of lung mass.  The various methods of treatment have been discussed with the patient and family. After consideration of risks, benefits and other options for treatment, the patient has consented to  Procedure(s): VIDEO BRONCHOSCOPY WITHOUT FLUORO (N/A) as a surgical intervention.  The patient's history has been reviewed, patient examined, no change in status, stable for surgery.  I have reviewed the patient's chart and labs.  Questions were answered to the patient's satisfaction.     Leanna Sato [name]

## 2021-04-16 NOTE — Telephone Encounter (Signed)
Unfortunately we had to cancel his bronchoscopy procedure today per anesthesia. He needs cardiology clearance and then bronchoscopy can be rescheduled He can resume Brilinta.  This has been communicated to the patient and nursing facility. Please obtain cardiology appointment for him ASAP with Dr Shannan Harper at [place] for cardiology clearance prior to bronchoscopy We can then reschedule procedure

## 2021-04-20 ENCOUNTER — Ambulatory Visit (HOSPITAL_COMMUNITY): Admission: RE | Admit: 2021-04-20 | Payer: Medicare HMO | Source: Ambulatory Visit

## 2021-04-27 ENCOUNTER — Telehealth: Payer: Self-pay | Admitting: Pulmonary Disease

## 2021-04-27 NOTE — Telephone Encounter (Signed)
Can we please check if he has been seen by cardiology @ [place]  for clearance ? ?Would like to schedule bronchoscopy procedure after ?

## 2021-04-28 NOTE — Telephone Encounter (Signed)
ATC patient at cell phone number listed and someone answered and said [facility] center, I tried to call patient's daughter and got message that says call cannot be completed as dialed. Looks like patient is scheduled for PFT and OV on 05/21/2021 may have to wait until appt to confirm information with patient. I called the number back for [facility] and was on hold for over 5 minutes. Will try again later ?

## 2021-04-29 NOTE — Telephone Encounter (Signed)
[place] and spoke with Tameka. She says pt not seen by cards yet, he is refusing any treatment at this time. She says that his nurse is in a meeting now, and she will have her call me back to discuss later today- her name is Mon. Will await a call back. ([place] [phone]) ?

## 2021-05-20 ENCOUNTER — Telehealth: Payer: Self-pay | Admitting: *Deleted

## 2021-05-20 NOTE — Telephone Encounter (Signed)
Called the office of Dr. Sarita Haver at [phone], I spoke with [place] regarding the referral, He was scheduled in March, the facility called and said he did not want to go anywhere.  The cardiology consult has not been done.  The patient is also not wanting to do the PET scan at this time as well. ? ?[name], ?[name] is scheduled for a PFT and an OV tomorrow.  He has not had the cardiology consult or the PET scan because he did not want to complete them at the time they were scheduled.  How would you like to proceed?  Thank you. ?

## 2021-05-21 ENCOUNTER — Ambulatory Visit: Payer: Medicare HMO | Admitting: Adult Health

## 2021-05-21 ENCOUNTER — Telehealth: Payer: Self-pay | Admitting: *Deleted

## 2021-05-21 NOTE — Telephone Encounter (Signed)
NO show today for ov and pft  ?

## 2021-05-21 NOTE — Telephone Encounter (Signed)
?  This patient has schizophrenia & bipolar ?Please send a letter to keep his follow up appt  ?Cc to PCP [facility] ? ?

## 2021-05-21 NOTE — Telephone Encounter (Signed)
Called the nursing facility and spoke with [name].  I explained that he has declined to go to the cardiologist appointment that was arranged to clear him for his bronch and he was scheduled to have a PFT today with an OV after. I let her know that we need to know what he wants to do going forward so we can let the physician know.  She stated she would go talk with the patient and the nurse and then call me back.  I will await her return call. ?

## 2021-05-24 NOTE — Telephone Encounter (Signed)
Letter printed and placed in out going mail. Pt's PCP cc'ed on letter as well. Nothing further needed at this time.  ?

## 2021-05-25 ENCOUNTER — Other Ambulatory Visit: Payer: Self-pay

## 2021-05-25 ENCOUNTER — Emergency Department (HOSPITAL_COMMUNITY)
Admission: EM | Admit: 2021-05-25 | Discharge: 2021-05-26 | Disposition: A | Discharge status: Other Acute Inpatient Hospital | Payer: Medicare HMO | Attending: Emergency Medicine | Admitting: Emergency Medicine

## 2021-05-25 ENCOUNTER — Encounter (HOSPITAL_COMMUNITY): Payer: Self-pay

## 2021-05-25 DIAGNOSIS — Z794 Long term (current) use of insulin: Secondary | ICD-10-CM | POA: Diagnosis not present

## 2021-05-25 DIAGNOSIS — T43591A Poisoning by other antipsychotics and neuroleptics, accidental (unintentional), initial encounter: Secondary | ICD-10-CM | POA: Insufficient documentation

## 2021-05-25 DIAGNOSIS — T402X1A Poisoning by other opioids, accidental (unintentional), initial encounter: Secondary | ICD-10-CM | POA: Insufficient documentation

## 2021-05-25 DIAGNOSIS — T50901A Poisoning by unspecified drugs, medicaments and biological substances, accidental (unintentional), initial encounter: Secondary | ICD-10-CM

## 2021-05-25 DIAGNOSIS — T4271XA Poisoning by unspecified antiepileptic and sedative-hypnotic drugs, accidental (unintentional), initial encounter: Secondary | ICD-10-CM | POA: Insufficient documentation

## 2021-05-25 DIAGNOSIS — T43011A Poisoning by tricyclic antidepressants, accidental (unintentional), initial encounter: Secondary | ICD-10-CM | POA: Diagnosis not present

## 2021-05-25 DIAGNOSIS — R531 Weakness: Secondary | ICD-10-CM | POA: Insufficient documentation

## 2021-05-25 LAB — CBC WITH DIFFERENTIAL/PLATELET
Abs Immature Granulocytes: 0.04 10*3/uL (ref 0.00–0.07)
Basophils Absolute: 0.1 10*3/uL (ref 0.0–0.1)
Basophils Relative: 1 %
Eosinophils Absolute: 0.3 10*3/uL (ref 0.0–0.5)
Eosinophils Relative: 3 %
HCT: 28.6 % — ABNORMAL LOW (ref 39.0–52.0)
Hemoglobin: 9.5 g/dL — ABNORMAL LOW (ref 13.0–17.0)
Immature Granulocytes: 0 %
Lymphocytes Relative: 32 %
Lymphs Abs: 3.5 10*3/uL (ref 0.7–4.0)
MCH: 28.5 pg (ref 26.0–34.0)
MCHC: 33.2 g/dL (ref 30.0–36.0)
MCV: 85.9 fL (ref 80.0–100.0)
Monocytes Absolute: 0.7 10*3/uL (ref 0.1–1.0)
Monocytes Relative: 7 %
Neutro Abs: 6.2 10*3/uL (ref 1.7–7.7)
Neutrophils Relative %: 57 %
Platelets: 210 10*3/uL (ref 150–400)
RBC: 3.33 MIL/uL — ABNORMAL LOW (ref 4.22–5.81)
RDW: 14.8 % (ref 11.5–15.5)
WBC: 10.9 10*3/uL — ABNORMAL HIGH (ref 4.0–10.5)
nRBC: 0 % (ref 0.0–0.2)

## 2021-05-25 LAB — CBG MONITORING, ED: Glucose-Capillary: 139 mg/dL — ABNORMAL HIGH (ref 70–99)

## 2021-05-25 MED ORDER — SODIUM CHLORIDE 0.9 % IV BOLUS
500.0000 mL | Freq: Once | INTRAVENOUS | Status: AC
  Administered 2021-05-25: 500 mL via INTRAVENOUS

## 2021-05-25 NOTE — ED Notes (Signed)
Poison control contacted. ?Patient given home medications plus seroquel 25mg , trazodone 100mg , MS Contin 15mg , Morphine Sulfate 60mg , buspirone 10mg , gabapentin 300mg , and alprazolam 1mg . ?

## 2021-05-25 NOTE — ED Provider Notes (Signed)
?[facility]-EMERGENCY DEPT ?Provider Note ? ? ?CSN: [id number] ?Arrival date & time: 05/25/21  2301 ? ?  ? ?History ? ?Chief Complaint  ?Patient presents with  ? Ingestion  ? ? ?[name] is a 66 y.o. male. ? ?The history is provided by the patient, the EMS personnel and medical records.  ?Ingestion ?[name] is a 66 y.o. male who presents to the Emergency Department complaining of accidental overdose.  He presents to the emergency department from [facility] by EMS for evaluation of accidental overdose.  He was in his routine state of health when he received his evening medicines.  He was also given his roommates medications.  The patient's primary medicines are Ativan 2 mg, cyclobenzaprine 10 mg, gabapentin 400 mg, Pravachol 80 mg, Restoril 30 mg, Roxicodone 5 mg, Seroquel 100 mg, ticagrelor 90 mg.  The roommates medications which she also received are quetiapine 25 mg trazodone 100 mg, MS Contin 15 mg, morphine sulfate 60 mg, buspirone 10 mg, gabapentin 300 mg, alprazolam 1 mg.  Patient reports feeling a little foggy with dry mouth but otherwise no acute complaints. ? ?  ? ?Home Medications ?Prior to Admission medications   ?Medication Sig Start Date End Date Taking? Authorizing Provider  ?acetaminophen (TYLENOL) 325 MG tablet Take 650 mg by mouth every 6 (six) hours as needed for moderate pain.    [provider]  ?albuterol (PROVENTIL) (2.5 MG/3ML) 0.083% nebulizer solution Take 2.5 mg by nebulization every 6 (six) hours as needed for wheezing or shortness of breath.    [provider]  ?albuterol (VENTOLIN HFA) 108 (90 Base) MCG/ACT inhaler Inhale 2 puffs into the lungs every 6 (six) hours as needed for wheezing or shortness of breath.    [provider]  ?allopurinol (ZYLOPRIM) 100 MG tablet Take 100 mg by mouth daily. 11/27/20   [provider]  ?cyclobenzaprine (FLEXERIL) 10 MG tablet Take 10 mg by mouth 3 (three) times daily as needed for  muscle spasms.    [provider]  ?furosemide (LASIX) 20 MG tablet Take 20 mg by mouth daily. 11/27/20   [provider]  ?gabapentin (NEURONTIN) 400 MG capsule Take 400 mg by mouth 3 (three) times daily.    [provider]  ?Glucagon 1 MG/0.2ML SOAJ Inject into the skin.    [provider]  ?Glucagon, rDNA, (GLUCAGON EMERGENCY) 1 MG KIT Inject 1 mg as directed as needed (hypoglycemia).    [provider]  ?Glycopyrrolate-Formoterol (BEVESPI AEROSPHERE) 9-4.8 MCG/ACT AERO Inhale 2 puffs into the lungs 2 (two) times daily.    [provider]  ?HUMALOG KWIKPEN 100 UNIT/ML KwikPen Inject 2-10 Units into the skin in the morning, at noon, in the evening, and at bedtime. Per Sliding Scale 03/04/21   [provider]  ?iron polysaccharides (NIFEREX) 150 MG capsule Take 150 mg by mouth daily. Nu-Iron    [provider]  ?levothyroxine (SYNTHROID) 75 MCG tablet Take 75 mcg by mouth daily before breakfast.    [provider]  ?lisinopril (ZESTRIL) 20 MG tablet Take 20 mg by mouth daily. 02/01/18   [provider]  ?LORazepam (ATIVAN) 2 MG tablet Take 2 mg by mouth every 12 (twelve) hours.    [provider]  ?metFORMIN (GLUCOPHAGE) 500 MG tablet Take 500 mg by mouth 2 (two) times daily with a meal.    [provider]  ?metoprolol succinate (TOPROL-XL) 25 MG 24 hr tablet Take 25 mg by mouth daily. 04/02/18  [provider]  ?omeprazole (PRILOSEC) 20 MG capsule Take 20 mg by mouth every morning.    [provider]  ?Oxycodone HCl 10 MG TABS Take 10 mg by mouth every 4 (four) hours as needed (pain).    [provider]  ?polyethylene glycol (MIRALAX / GLYCOLAX) packet Take 17 g by mouth daily as needed for mild constipation.     [provider]  ?pravastatin (PRAVACHOL) 80 MG tablet Take 80 mg by mouth at bedtime.    [provider]  ?QUEtiapine (SEROQUEL) 100 MG tablet Take 100 mg  by mouth at bedtime.    [provider]  ?temazepam (RESTORIL) 30 MG capsule Take 30 mg by mouth at bedtime.    [provider]  ?therapeutic multivitamin-minerals (THERAGRAN-M) tablet Take 1 tablet by mouth daily.    [provider]  ?ticagrelor (BRILINTA) 90 MG TABS tablet Take 90 mg by mouth 2 (two) times daily.    [provider]  ?venlafaxine (EFFEXOR) 75 MG tablet Take 75 mg by mouth daily.     [provider]  ?   ? ?Allergies    ?Rocephin [ceftriaxone], Zofran [ondansetron hcl], Ampicillin, Lipitor [atorvastatin], Prochlorperazine edisylate, Toradol [ketorolac tromethamine], and Tramadol   ? ?Review of Systems   ?Review of Systems  ?All other systems reviewed and are negative. ? ?Physical Exam ?Updated Vital Signs ?BP 110/70 (BP Location: Left Arm)   Pulse 74   Temp 98.5 ?F (36.9 ?C) (Oral)   Resp 12   Ht 6' (1.829 m)   Wt 103 kg   SpO2 98%   BMI 30.79 kg/m?  ?Physical Exam ?Vitals and nursing note reviewed.  ?Constitutional:   ?   Appearance: He is well-developed.  ?HENT:  ?   Head: Normocephalic and atraumatic.  ?   Mouth/Throat:  ?   Mouth: Mucous membranes are dry.  ?Cardiovascular:  ?   Rate and Rhythm: Normal rate and regular rhythm.  ?   Heart sounds: No murmur heard. ?Pulmonary:  ?   Effort: Pulmonary effort is normal. No respiratory distress.  ?   Breath sounds: Normal breath sounds.  ?Abdominal:  ?   Palpations: Abdomen is soft.  ?   Tenderness: There is no abdominal tenderness. There is no guarding or rebound.  ?Musculoskeletal:     ?   General: No swelling or tenderness.  ?Skin: ?   General: Skin is warm and dry.  ?Neurological:  ?   Mental Status: He is alert and oriented to person, place, and time.  ?   Comments: Mild generalized weakness  ?Psychiatric:     ?   Behavior: Behavior normal.  ? ? ?ED Results / Procedures / Treatments   ?Labs ?(all labs ordered are listed, but only abnormal results are displayed) ?Labs Reviewed  ?COMPREHENSIVE  METABOLIC PANEL - Abnormal; Notable for the following components:  ?    Result Value  ? Sodium 134 (*)   ? Glucose, Bld 126 (*)   ? Calcium 8.5 (*)   ? Total Bilirubin <0.1 (*)   ? All other components within normal limits  ?CBC WITH DIFFERENTIAL/PLATELET - Abnormal; Notable for the following components:  ? WBC 10.9 (*)   ? RBC 3.33 (*)   ? Hemoglobin 9.5 (*)   ? HCT 28.6 (*)   ? All other components within normal limits  ?MAGNESIUM - Abnormal; Notable for the following components:  ? Magnesium 1.3 (*)   ? All other components within normal limits  ?  CBG MONITORING, ED - Abnormal; Notable for the following components:  ? Glucose-Capillary 139 (*)   ? All other components within normal limits  ? ? ?EKG ?None ? ?Radiology ?No results found. ? ?Procedures ?Procedures  ? ?CRITICAL CARE ?Performed by: Quintella Reichert ? ? ?Total critical care time: 35 minutes ? ?Critical care time was exclusive of separately billable procedures and treating other patients. ? ?Critical care was necessary to treat or prevent imminent or life-threatening deterioration. ? ?Critical care was time spent personally by me on the following activities: development of treatment plan with patient and/or surrogate as well as nursing, discussions with consultants, evaluation of patient's response to treatment, examination of patient, obtaining history from patient or surrogate, ordering and performing treatments and interventions, ordering and review of laboratory studies, ordering and review of radiographic studies, pulse oximetry and re-evaluation of patient's condition. ? ?Medications Ordered in ED ?Medications  ?sodium chloride 0.9 % bolus 500 mL (0 mLs Intravenous Stopped 05/26/21 0532)  ?magnesium sulfate IVPB 2 g 50 mL (0 g Intravenous Stopped 05/26/21 0139)  ? ? ?ED Course/ Medical Decision Making/ A&P ?  ?                        ?Medical Decision Making ?Amount and/or Complexity of Data Reviewed ?Labs: ordered. ? ?Risk ?Prescription drug  management. ? ? ?Patient presents the emergency department for evaluation following accidental overdose on multiple medications that belonged to his roommate.  Patient did have some hypotension, felt foggy and had a dry mouth

## 2021-05-25 NOTE — ED Triage Notes (Signed)
Pt BIB [facility] from [facility] after being given his medications and his room mates night medications as well.  ?Pt hypotensive. ?

## 2021-05-26 LAB — COMPREHENSIVE METABOLIC PANEL
ALT: 13 U/L (ref 0–44)
AST: 17 U/L (ref 15–41)
Albumin: 3.5 g/dL (ref 3.5–5.0)
Alkaline Phosphatase: 101 U/L (ref 38–126)
Anion gap: 10 (ref 5–15)
BUN: 22 mg/dL (ref 8–23)
CO2: 23 mmol/L (ref 22–32)
Calcium: 8.5 mg/dL — ABNORMAL LOW (ref 8.9–10.3)
Chloride: 101 mmol/L (ref 98–111)
Creatinine, Ser: 1.16 mg/dL (ref 0.61–1.24)
GFR, Estimated: >60 mL/min (ref >60)
Glucose, Bld: 126 mg/dL — ABNORMAL HIGH (ref 70–99)
Potassium: 3.8 mmol/L (ref 3.5–5.1)
Sodium: 134 mmol/L — ABNORMAL LOW (ref 135–145)
Total Bilirubin: <0.1 mg/dL — ABNORMAL LOW (ref 0.3–1.2)
Total Protein: 7 g/dL (ref 6.5–8.1)

## 2021-05-26 LAB — MAGNESIUM: Magnesium: 1.3 mg/dL — ABNORMAL LOW (ref 1.7–2.4)

## 2021-05-26 MED ORDER — MAGNESIUM SULFATE 2 GM/50ML IV SOLN
2.0000 g | Freq: Once | INTRAVENOUS | Status: AC
  Administered 2021-05-26: 2 g via INTRAVENOUS
  Filled 2021-05-26: qty 50

## 2021-05-26 NOTE — ED Notes (Signed)
[facility] notified.  ?

## 2021-05-26 NOTE — ED Notes (Signed)
Attempted to call [facility], no answer.  ?

## 2021-05-26 NOTE — ED Notes (Signed)
[place] [place] notified of patient's return.  ?

## 2021-06-18 ENCOUNTER — Other Ambulatory Visit: Payer: Self-pay

## 2021-06-18 ENCOUNTER — Emergency Department (HOSPITAL_COMMUNITY)
Admission: EM | Admit: 2021-06-18 | Discharge: 2021-06-21 | Disposition: A | Discharge status: Skilled Nursing Facility | Payer: Medicare HMO | Attending: Emergency Medicine | Admitting: Emergency Medicine

## 2021-06-18 DIAGNOSIS — J441 Chronic obstructive pulmonary disease with (acute) exacerbation: Secondary | ICD-10-CM | POA: Diagnosis not present

## 2021-06-18 DIAGNOSIS — Z79899 Other long term (current) drug therapy: Secondary | ICD-10-CM | POA: Insufficient documentation

## 2021-06-18 DIAGNOSIS — R45851 Suicidal ideations: Secondary | ICD-10-CM | POA: Diagnosis not present

## 2021-06-18 DIAGNOSIS — E039 Hypothyroidism, unspecified: Secondary | ICD-10-CM | POA: Insufficient documentation

## 2021-06-18 DIAGNOSIS — F4323 Adjustment disorder with mixed anxiety and depressed mood: Secondary | ICD-10-CM | POA: Diagnosis not present

## 2021-06-18 DIAGNOSIS — Z7984 Long term (current) use of oral hypoglycemic drugs: Secondary | ICD-10-CM | POA: Diagnosis not present

## 2021-06-18 DIAGNOSIS — I10 Essential (primary) hypertension: Secondary | ICD-10-CM | POA: Insufficient documentation

## 2021-06-18 DIAGNOSIS — F329 Major depressive disorder, single episode, unspecified: Secondary | ICD-10-CM | POA: Diagnosis not present

## 2021-06-18 DIAGNOSIS — E1142 Type 2 diabetes mellitus with diabetic polyneuropathy: Secondary | ICD-10-CM | POA: Diagnosis not present

## 2021-06-18 DIAGNOSIS — Z046 Encounter for general psychiatric examination, requested by authority: Secondary | ICD-10-CM | POA: Diagnosis present

## 2021-06-18 DIAGNOSIS — Y900 Blood alcohol level of less than 20 mg/100 ml: Secondary | ICD-10-CM | POA: Insufficient documentation

## 2021-06-18 DIAGNOSIS — I2510 Atherosclerotic heart disease of native coronary artery without angina pectoris: Secondary | ICD-10-CM | POA: Insufficient documentation

## 2021-06-18 DIAGNOSIS — Z794 Long term (current) use of insulin: Secondary | ICD-10-CM | POA: Insufficient documentation

## 2021-06-18 LAB — CBC WITH DIFFERENTIAL/PLATELET
Abs Immature Granulocytes: 0.03 10*3/uL (ref 0.00–0.07)
Basophils Absolute: 0 10*3/uL (ref 0.0–0.1)
Basophils Relative: 0 %
Eosinophils Absolute: 0.2 10*3/uL (ref 0.0–0.5)
Eosinophils Relative: 2 %
HCT: 28.8 % — ABNORMAL LOW (ref 39.0–52.0)
Hemoglobin: 9.6 g/dL — ABNORMAL LOW (ref 13.0–17.0)
Immature Granulocytes: 0 %
Lymphocytes Relative: 30 %
Lymphs Abs: 3 10*3/uL (ref 0.7–4.0)
MCH: 28.3 pg (ref 26.0–34.0)
MCHC: 33.3 g/dL (ref 30.0–36.0)
MCV: 85 fL (ref 80.0–100.0)
Monocytes Absolute: 0.7 10*3/uL (ref 0.1–1.0)
Monocytes Relative: 6 %
Neutro Abs: 6.3 10*3/uL (ref 1.7–7.7)
Neutrophils Relative %: 62 %
Platelets: 251 10*3/uL (ref 150–400)
RBC: 3.39 MIL/uL — ABNORMAL LOW (ref 4.22–5.81)
RDW: 15.4 % (ref 11.5–15.5)
WBC: 10.3 10*3/uL (ref 4.0–10.5)
nRBC: 0 % (ref 0.0–0.2)

## 2021-06-18 LAB — COMPREHENSIVE METABOLIC PANEL
ALT: 12 U/L (ref 0–44)
AST: 13 U/L — ABNORMAL LOW (ref 15–41)
Albumin: 3.9 g/dL (ref 3.5–5.0)
Alkaline Phosphatase: 103 U/L (ref 38–126)
Anion gap: 10 (ref 5–15)
BUN: 29 mg/dL — ABNORMAL HIGH (ref 8–23)
CO2: 25 mmol/L (ref 22–32)
Calcium: 9.2 mg/dL (ref 8.9–10.3)
Chloride: 99 mmol/L (ref 98–111)
Creatinine, Ser: 1.26 mg/dL — ABNORMAL HIGH (ref 0.61–1.24)
GFR, Estimated: >60 mL/min (ref >60)
Glucose, Bld: 89 mg/dL (ref 70–99)
Potassium: 4.7 mmol/L (ref 3.5–5.1)
Sodium: 134 mmol/L — ABNORMAL LOW (ref 135–145)
Total Bilirubin: 0.5 mg/dL (ref 0.3–1.2)
Total Protein: 7.8 g/dL (ref 6.5–8.1)

## 2021-06-18 LAB — ETHANOL: Alcohol, Ethyl (B): <10 mg/dL (ref <10)

## 2021-06-18 MED ORDER — LORAZEPAM 1 MG PO TABS
1.0000 mg | ORAL_TABLET | Freq: Once | ORAL | Status: AC
  Administered 2021-06-18: 1 mg via ORAL
  Filled 2021-06-18: qty 1

## 2021-06-18 MED ORDER — OXYCODONE HCL 5 MG PO TABS
10.0000 mg | ORAL_TABLET | Freq: Once | ORAL | Status: AC
  Administered 2021-06-18: 10 mg via ORAL
  Filled 2021-06-18: qty 2

## 2021-06-18 NOTE — ED Provider Notes (Signed)
?[facility] DEPT ?Provider Note ? ? ?CSN: [id number] ?Arrival date & time: 06/18/21  1715 ? ?  ? ?History ?No chief complaint on file. ? ? ?[name] is a 66 y.o. male with a past medical history of bipolar disorder, depression and recently diagnosed lung mass presenting today under IVC.  Patient lives in [facility] and tells me that there was discussion about sending him to a different facility.  He got upset and the social worker came and asked him if he has ever had thoughts of hurting himself.  He says that he has had them before and when she asked when the last time was, he said that it was earlier in the day when they were talking about moving him to a different facility.  Says that he did not have a plan to end his life and has not had suicidal attempts in the past.  He does state that when he was asked how he would end his life he stated that he would roll his chair in front of a bus however he does not intend to do this. ? ?HPI ? ?  ? ?Home Medications ?Prior to Admission medications   ?Medication Sig Start Date End Date Taking? Authorizing Provider  ?acetaminophen (TYLENOL) 325 MG tablet Take 650 mg by mouth every 6 (six) hours as needed for moderate pain.    [provider]  ?albuterol (PROVENTIL) (2.5 MG/3ML) 0.083% nebulizer solution Take 2.5 mg by nebulization every 6 (six) hours as needed for wheezing or shortness of breath.    [provider]  ?albuterol (VENTOLIN HFA) 108 (90 Base) MCG/ACT inhaler Inhale 2 puffs into the lungs every 6 (six) hours as needed for wheezing or shortness of breath.    [provider]  ?allopurinol (ZYLOPRIM) 100 MG tablet Take 100 mg by mouth daily. 11/27/20   [provider]  ?cyclobenzaprine (FLEXERIL) 10 MG tablet Take 10 mg by mouth 3 (three) times daily as needed for muscle spasms.    [provider]  ?furosemide (LASIX) 20 MG tablet Take 20 mg by mouth daily. 11/27/20   [provider]  ?gabapentin (NEURONTIN) 400 MG capsule Take 400 mg by mouth 3 (three) times daily.    [provider]  ?Glucagon 1 MG/0.2ML SOAJ Inject into the skin.    [provider]  ?Glucagon, rDNA, (GLUCAGON EMERGENCY) 1 MG KIT Inject 1 mg as directed as needed (hypoglycemia).    [provider]  ?Glycopyrrolate-Formoterol (BEVESPI AEROSPHERE) 9-4.8 MCG/ACT AERO Inhale 2 puffs into the lungs 2 (two) times daily.    [provider]  ?HUMALOG KWIKPEN 100 UNIT/ML KwikPen Inject 2-10 Units into the skin in the morning, at noon, in the evening, and at bedtime. Per Sliding Scale 03/04/21   [provider]  ?iron polysaccharides (NIFEREX) 150 MG capsule Take 150 mg by mouth daily. Nu-Iron    [provider]  ?levothyroxine (SYNTHROID) 75 MCG tablet Take 75 mcg by mouth daily before breakfast.    [provider]  ?lisinopril (ZESTRIL) 20 MG tablet Take 20 mg by mouth daily. 02/01/18   [provider]  ?LORazepam (ATIVAN) 2 MG tablet Take 2 mg by mouth every 12 (twelve) hours.    [provider]  ?metFORMIN (GLUCOPHAGE) 500 MG tablet Take 500 mg by mouth 2 (two) times daily with a meal.    [provider]  ?metoprolol succinate (TOPROL-XL) 25 MG 24 hr tablet Take 25 mg by mouth daily.  04/02/18   [provider]  ?omeprazole (PRILOSEC) 20 MG capsule Take 20 mg by mouth every morning.    [provider]  ?Oxycodone HCl 10 MG TABS Take 10 mg by mouth every 4 (four) hours as needed (pain).    [provider]  ?polyethylene glycol (MIRALAX / GLYCOLAX) packet Take 17 g by mouth daily as needed for mild constipation.     [provider]  ?pravastatin (PRAVACHOL) 80 MG tablet Take 80 mg by mouth at bedtime.    [provider]  ?QUEtiapine (SEROQUEL) 100 MG tablet Take 100 mg by mouth at bedtime.    [provider]  ?temazepam (RESTORIL) 30 MG capsule Take 30 mg by mouth at bedtime.     [provider]  ?therapeutic multivitamin-minerals (THERAGRAN-M) tablet Take 1 tablet by mouth daily.    [provider]  ?ticagrelor (BRILINTA) 90 MG TABS tablet Take 90 mg by mouth 2 (two) times daily.    [provider]  ?venlafaxine (EFFEXOR) 75 MG tablet Take 75 mg by mouth daily.     [provider]  ?   ? ?Allergies    ?Rocephin [ceftriaxone], Zofran [ondansetron hcl], Ampicillin, Lipitor [atorvastatin], Prochlorperazine edisylate, Toradol [ketorolac tromethamine], and Tramadol   ? ?Review of Systems   ?Review of Systems ? ?Physical Exam ?Updated Vital Signs ?BP 103/67 (BP Location: Left Arm)   Pulse 70   Temp 99 ?F (37.2 ?C) (Oral)   Resp 20   SpO2 97%  ?Physical Exam ?Vitals and nursing note reviewed.  ?Constitutional:   ?   Appearance: Normal appearance.  ?HENT:  ?   Head: Normocephalic and atraumatic.  ?Eyes:  ?   General: No scleral icterus. ?   Conjunctiva/sclera: Conjunctivae normal.  ?Pulmonary:  ?   Effort: Pulmonary effort is normal. No respiratory distress.  ?Skin: ?   Findings: No rash.  ?Neurological:  ?   Mental Status: He is alert.  ?Psychiatric:     ?   Mood and Affect: Mood normal.  ? ? ?ED Results / Procedures / Treatments   ?Labs ?(all labs ordered are listed, but only abnormal results are displayed) ?Labs Reviewed  ?COMPREHENSIVE METABOLIC PANEL - Abnormal; Notable for the following components:  ?    Result Value  ? Sodium 134 (*)   ? BUN 29 (*)   ? Creatinine, Ser 1.26 (*)   ? AST 13 (*)   ? All other components within normal limits  ?CBC WITH DIFFERENTIAL/PLATELET - Abnormal; Notable for the following components:  ? RBC 3.39 (*)   ? Hemoglobin 9.6 (*)   ? HCT 28.8 (*)   ? All other components within normal limits  ?ETHANOL  ?RAPID URINE DRUG SCREEN, HOSP PERFORMED  ? ? ?EKG ?None ? ?Radiology ?No results found. ? ?Procedures ?Procedures  ? ?Medications Ordered in ED ?Medications  ?LORazepam (ATIVAN) tablet 1 mg (has no administration in time  range)  ? ? ?ED Course/ Medical Decision Making/ A&P ?  ?                        ?Medical Decision Making ?Amount and/or Complexity of Data Reviewed ?Labs: ordered. ? ?Risk ?Prescription drug management. ? ? ?This patient presents to the ED for concern of SI. ?   ?Past Medical History / Co-morbidities / Social History: ?Bipolar and depression ?  ?Additional history: ?Additional history obtained from [facility].  They state that the patient denied SI to them however  the facility said that he endorsed a plan to roll his wheelchair in front of a bus. ?  ?Physical Exam: ?Physical exam performed.  Patient was without pain, resting comfortably and was very pleasant. ? ?Lab Tests: ?I ordered, and personally interpreted labs.  The pertinent results include: Creatinine bumped to 1.26 from 1.16 2 weeks ago.   ?  ?Medications: ?I ordered medication including Ativan for anxiety.  He reports that his psychiatrist was sending Xanax to his facility due to insomnia as well.. Reevaluation of the patient after these medicines showed that the patient improved. I have reviewed the patients home medicines and have made adjustments as needed. ?  ?Consultations Obtained: ?TTS consult ordered at this time ? ?Disposition: ?After his initial screening work-up I considered resending patient's IVC however nursing staff notified me that he also told them that he plan to roll his wheelchair in front of a bus.  IVC will be upheld at this time and medically clear. TTS will dispo the patient. ?  ?I discussed this case with my attending physician Dr. Darl Householder who cosigned this note including patient's presenting symptoms, physical exam, and planned diagnostics and interventions. Attending physician stated agreement with plan or made changes to plan which were implemented.    ? ?Final Clinical Impression(s) / ED Diagnoses ?Final diagnoses:  ?Suicidal ideation  ? ? ?Rx / DC Orders ?TTS to dispo ? ? ?  ?Rhae Hammock, PA-C ?06/18/21 1904 ? ?  ?Drenda Freeze, MD ?06/18/21 2256 ? ?

## 2021-06-18 NOTE — ED Triage Notes (Signed)
BIB [facility] from [facility], pt reports wanting to roll himself into traffic with his wheelchair and get ran over.  ?
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
## 2021-06-19 DIAGNOSIS — F4323 Adjustment disorder with mixed anxiety and depressed mood: Secondary | ICD-10-CM

## 2021-06-19 LAB — RAPID URINE DRUG SCREEN, HOSP PERFORMED
Amphetamines: NOT DETECTED
Barbiturates: NOT DETECTED
Benzodiazepines: POSITIVE — AB
Cocaine: NOT DETECTED
Opiates: NOT DETECTED
Tetrahydrocannabinol: NOT DETECTED

## 2021-06-19 MED ORDER — VENLAFAXINE HCL 75 MG PO TABS: 150.0000 mg | ORAL_TABLET | Freq: Every day | ORAL | Status: DC

## 2021-06-19 MED ORDER — OXYCODONE HCL 5 MG PO TABS
10.0000 mg | ORAL_TABLET | ORAL | Status: DC | PRN
  Administered 2021-06-19 – 2021-06-21 (×10): 10 mg via ORAL
  Filled 2021-06-19 (×10): qty 2

## 2021-06-19 MED ORDER — FUROSEMIDE 20 MG PO TABS
20.0000 mg | ORAL_TABLET | Freq: Every day | ORAL | Status: DC
  Administered 2021-06-19 – 2021-06-20 (×2): 20 mg via ORAL
  Filled 2021-06-19 (×3): qty 1

## 2021-06-19 MED ORDER — METOPROLOL SUCCINATE ER 50 MG PO TB24
25.0000 mg | ORAL_TABLET | Freq: Every day | ORAL | Status: DC
  Administered 2021-06-19 – 2021-06-20 (×2): 25 mg via ORAL
  Filled 2021-06-19 (×2): qty 1

## 2021-06-19 MED ORDER — LISINOPRIL 20 MG PO TABS
20.0000 mg | ORAL_TABLET | Freq: Every day | ORAL | Status: DC
  Administered 2021-06-19 – 2021-06-20 (×2): 20 mg via ORAL
  Filled 2021-06-19: qty 1
  Filled 2021-06-19: qty 2

## 2021-06-19 MED ORDER — LORAZEPAM 1 MG PO TABS
2.0000 mg | ORAL_TABLET | Freq: Two times a day (BID) | ORAL | Status: DC
  Administered 2021-06-19 – 2021-06-21 (×6): 2 mg via ORAL
  Filled 2021-06-19 (×6): qty 2

## 2021-06-19 MED ORDER — LEVOTHYROXINE SODIUM 50 MCG PO TABS
75.0000 ug | ORAL_TABLET | Freq: Every day | ORAL | Status: DC
  Administered 2021-06-19 – 2021-06-21 (×3): 75 ug via ORAL
  Filled 2021-06-19 (×3): qty 1

## 2021-06-19 MED ORDER — QUETIAPINE FUMARATE 100 MG PO TABS
100.0000 mg | ORAL_TABLET | Freq: Every day | ORAL | Status: DC
  Administered 2021-06-19 – 2021-06-20 (×2): 100 mg via ORAL
  Filled 2021-06-19 (×2): qty 1

## 2021-06-19 MED ORDER — PRAVASTATIN SODIUM 20 MG PO TABS
80.0000 mg | ORAL_TABLET | Freq: Every day | ORAL | Status: DC
  Administered 2021-06-19 – 2021-06-20 (×2): 80 mg via ORAL
  Filled 2021-06-19 (×2): qty 4

## 2021-06-19 MED ORDER — GABAPENTIN 400 MG PO CAPS
400.0000 mg | ORAL_CAPSULE | Freq: Three times a day (TID) | ORAL | Status: DC
  Administered 2021-06-19 – 2021-06-21 (×7): 400 mg via ORAL
  Filled 2021-06-19 (×7): qty 1

## 2021-06-19 MED ORDER — ALBUTEROL SULFATE HFA 108 (90 BASE) MCG/ACT IN AERS: 2.0000 | INHALATION_SPRAY | Freq: Four times a day (QID) | RESPIRATORY_TRACT | Status: DC | PRN

## 2021-06-19 MED ORDER — VENLAFAXINE HCL 75 MG PO TABS
75.0000 mg | ORAL_TABLET | Freq: Every day | ORAL | Status: DC
  Administered 2021-06-19: 75 mg via ORAL
  Filled 2021-06-19 (×2): qty 1

## 2021-06-19 MED ORDER — TEMAZEPAM 15 MG PO CAPS
30.0000 mg | ORAL_CAPSULE | Freq: Every day | ORAL | Status: DC
  Administered 2021-06-19 – 2021-06-20 (×2): 30 mg via ORAL
  Filled 2021-06-19 (×2): qty 2

## 2021-06-19 MED ORDER — PANTOPRAZOLE SODIUM 40 MG PO TBEC
40.0000 mg | DELAYED_RELEASE_TABLET | Freq: Every day | ORAL | Status: DC
  Administered 2021-06-19 – 2021-06-21 (×3): 40 mg via ORAL
  Filled 2021-06-19 (×3): qty 1

## 2021-06-19 MED ORDER — METFORMIN HCL 500 MG PO TABS
500.0000 mg | ORAL_TABLET | Freq: Two times a day (BID) | ORAL | Status: DC
  Administered 2021-06-19 – 2021-06-21 (×5): 500 mg via ORAL
  Filled 2021-06-19 (×5): qty 1

## 2021-06-19 MED ORDER — VENLAFAXINE HCL 75 MG PO TABS
75.0000 mg | ORAL_TABLET | Freq: Two times a day (BID) | ORAL | Status: DC
  Administered 2021-06-19 – 2021-06-21 (×4): 75 mg via ORAL
  Filled 2021-06-19 (×4): qty 1

## 2021-06-19 MED ORDER — ALLOPURINOL 100 MG PO TABS
100.0000 mg | ORAL_TABLET | Freq: Every day | ORAL | Status: DC
  Administered 2021-06-19 – 2021-06-21 (×3): 100 mg via ORAL
  Filled 2021-06-19 (×4): qty 1

## 2021-06-19 MED ORDER — TICAGRELOR 90 MG PO TABS
90.0000 mg | ORAL_TABLET | Freq: Two times a day (BID) | ORAL | Status: DC
  Administered 2021-06-19 – 2021-06-21 (×5): 90 mg via ORAL
  Filled 2021-06-19 (×6): qty 1

## 2021-06-19 NOTE — Progress Notes (Addendum)
Per Hessie Diener Bobbitt,NP, patient meets criteria for inpatient treatment. There are no available at [facility] today, per Adalberto Ill, [facility]. CSW faxed referrals to the following facilities for review: ? ?[address] [place] [address] [phone] --  ?[address] [place] [address] [phone] --  ?[facility]  Pending - Request Sent N/A [place], [place] [address] [phone] --  ?[employer] N/A [address] [place], [place] [address] [phone] --  ?[facility] [address] [place] [address] [phone] --  ?[place] N/A [address], [place] [address] [phone] --  ?[place] N/A [address] [place] [address] [phone] --  ?[place] N/A [address] [place] [address] [phone] --  ?[place] N/A [place] [place] [address] [phone] --  ? ?TTS will continue to seek bed placement. ? ?[place]ie [place], MSW, LCSW-A, LCAS-A ?Phone: [phone] ?Disposition/TOC ? ?

## 2021-06-19 NOTE — ED Notes (Addendum)
Patients sitter is in the room. ?

## 2021-06-19 NOTE — ED Notes (Addendum)
06/19/2021 ? ?[name] ?[birth year] ?[id number] ? ?CSW contacted [facility] and spoke with nurse assigned to patient.  Nurse indicated that the administrator of the facility is requesting something in writing from the patient that he is no longer experiencing suicidal ideations.  Nurse encouraged CSW to speak directly with the administrator, transferring CSW to voicemail.  HIPAA compliant message left, as CSW awaits a return call.  In the meantime, CSW will fax all necessary paperwork to nurse for review, including note of psychiatric clearance.  CSW will continue to follow. ? ?Addendum: ? ?Return call received from [facility], Scientist, physiological at [facility] [phone]), on 06/21/2021, indicating that she will require written confirmation that patient is no longer suicidal, and that patient cannot be chemically restrained before returning to the facility. ? ?Nat Christen, BSW, MSW, LCSW  ?Licensed Clinical Social Worker  ?[facility]  ?Mailing Address-[address] [place], [place] [address] ?Physical Address-[address], [place], [place] [address] ?Toll Free Main # [phone] ?Fax # [phone] ?Cell # [phone] ?Di [email or web address] ? ? ? ? ? ?  ?

## 2021-06-19 NOTE — Discharge Instructions (Signed)
Schizophrenia/Mental Health Resources ?National Suicide Prevention Lifeline ?1-800-273-TALK (352)394-4356) ?http://www.webster.biz/ ??988?-Mental Health Crisis Line ? ?[place] ?https://sczaction.org/ ?[place] ?306-003-6296 ?nimhinfo@nih .gov (e-mail) ?https://carter.com/ ?Schizophrenia & Psychosis Action Alliance ?605-677-0210 ?info@sczaction .org ?https://sczaction.org/  ?5 Additional Healthline identified ?Best online Schizophrenia Support Groups? ?Students with Psychosis ? ?Schizophrenia Spectrum Support ? ?Supportiv ($30 monthly fee)  ? ?NAMI Connection Recovery Support Group ? ?Schizophrenia Alliance ? ?Local Resources: ?Outpatient:  ? ?[facility]:  ?([phone] ?[place], [place]  [address] ?  ? ? ?https://richard.com/ ? ? ?[place]  ?Outpatient Behavior[facility] at [place] ?[address] ?[place], [place] [address] ?[phone] ? ?[place]  ?Outpatient Behavioral Health at [facility] ?[address] ?[place], [place]  [address] ?[phone] ?Local Resources ?(Inpatient) ? ?[place] ?[facility] ?[address]  ?[place], [place] [address] ?[phone] ? ?[facility] ?Behavioral Medicine Unit and Geriatric Psychiatric Unit   ?[address]  ?[place], [place] [address] ?[phone] ? ? ?[place] ?TastyShow.cz ? ?[place] ?https://namiguilford.org/ ? ?[employer] and Support Resources: ? ?Partners Ending Homelessness ?PainGain.tn ? ?[place] ?https://www.interactiveresourcecenter.org/ ? ?[employer] ?https://www.greensborourbanministry.org/ ? ?[facility] [employer] (adult [employer] shelter) ?www.ViralSquad.com.cy ?[address] ?[place], [place] [address] ?[phone] ?The [employer] of [place] ?[address] [place], [place] [address] ?[phone] ? ? ?[employer] ?[phone] V[address] [phone]) ? ?The [employer] of Companies ?250-752-7748 press 1 ?Confidential chat-VeteransCrisisLine.net ?Or Text to 408-717-2667 ? ?[place] [address] ?Call 211 or (612) 382-2513 ?www.DealerOdds.hu ? ?Affordable Housing Resources in [place].com   ?[phone] ? ?Shelters ? ?[place] ?[phone] ?8:30am-5:30pm ?[facility] [address] ?[place], [place]  [address] ?[phone] ?Male veterans 18+ with substance abuse issues ?Eligibility:  By Referral Only ? ?[place] ?[address] ?[place], [place] [address] ?[phone] Ext. [address] ?Adult Men & Women ?Eligibility: Valid ID & Social Security Card ?www.greensborourbanministry.org ? ?[facility]aring [facility] - Vet Safety Net ?[place], [place]  [address] ?[phone] ?Male veterans 18+ with substance abuse issues ?Eligibility:  By Referral Only ? ?[place] ?[address] ?[place], [place]  [address] ?[phone] ?Single women 18+ without dependents ?Open 6pm-8am ?Eligibility:  Valid ID & Social Security Card ?Call to check availability  ?http://westendministries.org/leslieshouse.aspx ? ?[facility] ?[address] ?[place], [place]  [address] ?[phone] ?Male veterans 18+ with substance abuse/mental ?health issues ?Eligibility:  By Referral Only ? ?[facility] ?[place] ?[place], [place] [address] ?[phone] ?Call to check availability ?Males 18+ ?Eligibility: Valid ID & Social Security Card ?www.odm-hp.org ? ?Solicitor of [employer] ?[address] ?[place], [place] [address] ?[phone] ?Women 18+ & Families with children ?Eligibility:  Valid ID  & Criminal Background Check ?BattleCheck.dk ? ? ?Community Care of [place] (Care Management): [phone] ?[facility]: [place]rest 24 Hour Phone Line [phone] ?[place] [phone] ?[employer] [phone] ? ?2-1-1 Referral Service ?United Way 211 ?Call 211 or ([phone])692-3577 ?www.DealerOdds.hu ?A free [employer], 24/7 telephone information and referral service to help link citizens who are seeking help with the community resources they need. In [place], [place], [place] and [place] counties, just dial 211 from your phone. ?Mental Health Association in [place] ?Support groups for anxiety, depression and bipolar disorder, schizophrenia,  family and friends, aftermath of suicide, and mental wellness for Latinos (in [place]). ?Mental Health Association in [place] ?Offers support groups, [employer] offers. Psychological, vocational, educational and other rehabilitation services to those who suffer from mental health illness of the 71 and up (5 days a week/5hours a day), offer out patient services like diagnostic evaluation, comprehensive clinical assessments, individual counseling, group therapy, psycho-educational workshops, referral to other specialists, referral to a psychiatrist for an evaluation for medication, consultation, and outreach/training. ?ADS (Alcohol and Drug Services) ?(336) Y3330987 6156497368 ?Substance Abuse education, prevention and treatment (detox, assessments, intensive outpatient and inpatient counseling and programs). ?[place] ?[place] ([phone]) [phone], [place] (336) F6855624 ?Support groups for posttraumatic stress, depression, and schizophrenia? as well as day programs for individuals with severe mental illness. ?[place] ([phone] ?[facility]-FREE-[phone] ?[employer] [phone] or www.kellinfoundation.org ?Telehealth platform, Individual counseling across  the lifespan for both mental health and substance use, support groups, advocacy, case management, virtual villages, resource coordination. ?[facility]313 [phone] ?[facility]-FREE-973-603-8909 or [phone] ?[phone]. Provides mental health services to all residents regardless of ability to pay. [address] [place]. 24 ?Domestic Violence Crisis Line ?Family Service of the [place] ?Call for shelter and/or safety planning ?[place] ?[phone] (24/7) ?[facility] ?[phone] (24/7) ? ?[place] ?7247381197 ? ?The [employer] Companies ?9035144219 press 1 ?Confidential chat-VeteransCrisisLine.net ?Or Text to ([phone] ? ?[facility] ?[address] ?[place], [place] [address] ?[phone] ?Hours: Mon-Fri. 8am-5pm ?Therapeutic Alternatives Mobile Crisis Management ?Mobile crisis response for mental health, substance abuse or intellectual/developmental disabilities ?3205277017 ? ? ? ? ?Disclaimer: This resource list is subject to change at any time and is a starting point for resource identification as of 02/19/2021.  ? ?

## 2021-06-19 NOTE — ED Provider Notes (Addendum)
Emergency Medicine Observation Re-evaluation Note ? ?[name] is a 66 y.o. male, seen on rounds today.  Pt initially presented to the ED for complaints of Suicidal ?Currently, the patient is awake, actively engaging with TTS. ? ?Physical Exam  ?BP 94/72 (BP Location: Left Arm)   Pulse 77   Temp 98.8 ?F (37.1 ?C) (Oral)   Resp 18   SpO2 95%  ?Physical Exam ?General: NAD ?Cardiac: well perfused ?Lungs: even and unlabored ?Psych: deferred ? ?ED Course / MDM  ?EKG:  ? ?I have reviewed the labs performed to date as well as medications administered while in observation.  Recent changes in the last 24 hours include none. ? ?12:40 ?Per Oneida Alar, NP, psych has evaluated the patient and cleared him psychiatrically for discharge back to his facility.  ? ?IVC reversal paperwork filed. ? ?Plan  ?Current plan is for reversal of IVC and discharge back to his facility.  ?[name] is under involuntary commitment. ? ?  ?Regan Lemming, MD ?06/19/21 0825 ? ? ?  ?Regan Lemming, MD ?06/19/21 1242 ? ?

## 2021-06-19 NOTE — Care Management (Signed)
Called [place] was transferred to RN, but no answer ?

## 2021-06-19 NOTE — ED Notes (Signed)
I have called the [facility] and TTS numbers multiple times to get a copy of a safety contract. No answer. I have contacted the [facility] of this hospital. Waiting for instruction.  ?

## 2021-06-19 NOTE — ED Notes (Signed)
Patient to room 27. Patient calm, cooperative, no s/s of distress.  Patient oriented to unit and room.    ?

## 2021-06-19 NOTE — Consult Note (Addendum)
Telepsych Consultation  ? ?Reason for Consult:  SI ?Referring Physician:  Mervyn Gay PA-C ?Location of Patient:  [name] ?Location of Provider: [facility] ? ?Patient Identification: [name] ?MRN:  [id number] ?Principal Diagnosis: Adjustment disorder with mixed anxiety and depressed mood ?Diagnosis:  Principal Problem: ?  Adjustment disorder with mixed anxiety and depressed mood ?Active Problems: ?  DEPRESSION, MAJOR ? ? ?Total Time spent with patient: 20 minutes ? ?Subjective:   ?[name] is a 66 y.o. male patient admitted with suicidal ideations. ? ?Patient presents alert and oriented, calm and cooperative. "The social worker at the nursing home she was asking me a bunch of questions I guess I answered them wrong because the next thing I know the police bought me here". Reports feeling "up and down lately" feels there are "more ups than downs". States he has a history bipolar disorder that he's "had for years" takes Seroquel; reports medication compliance.  ? ?Denies any access to firearms or medications. States he doesn't ever leave facility or ambulate to street. Per EDRN report patient is wheelchair bound and requires assistance with ADLs. He denies any suicidal or homicidal ideations, auditory or visual hallucinations, and does not appear to be responding to any external/internal stimuli at this time.  ? ?Collateral: [place] [phone] charge nurse: x3 attempts unsuccessful; provider waited on hold 15 mins ? ? ?HPI:  [name] is a 66 year old male patient with past psychiatric history of bipolar disorder who presented [facility] under IVC by facility social worker for suicidal ideations.  ?He currently lives at [facility] facility where he receives 24 hour care. Of note patient was seen in [place] (accidental overdose) after given his + roommates medication by nursing home staff, he was treated in ED and discharged. Recently diagnosed with lung  cancer, denies any feelings about it states he is "still trying to process it". Says social worker told him he needed to move from current facility due to him not "paying enough" and will be moved to an assisted living in [facility]; patient upset about the move due to him stating that facility takes his entire $1600/mos check. ? ?Past Psychiatric History: bipolar ? ?Risk to Self:  pt denies ?Risk to Others:  pt denies ?Prior Inpatient Therapy:  yes, 2006 ?Prior Outpatient Therapy:  not clear ? ?Past Medical History:  ?Past Medical History:  ?Diagnosis Date  ? Anginal pain ([place])   ? Anxiety   ? Arthritis   ? "right hip; herniated L4-5" (11/05/2015)  ? Asthma   ? Bipolar 1 disorder ([place])   ? CHF (congestive heart failure) ([place])   ? Chronic bronchitis ([place])   ? Chronic lower back pain   ? Chronic pain disorder   ? Community acquired pneumonia 10/30/2016  ? COPD (chronic obstructive pulmonary disease) ([place])   ? Coronary artery disease   ? Coronary atherosclerosis 12/05/2008  ? Qualifier: Diagnosis of  By: Sidney Ace    ? DEEP VENOUS THROMBOPHLEBITIS, HX OF 12/05/2008  ? Qualifier: Diagnosis of  By: Sidney Ace    ? DEPRESSION, MAJOR 12/05/2008  ? Qualifier: Diagnosis of  By: Sidney Ace    ? Diabetes mellitus type 2 with complications ([place]) 9/62/9528  ? Diverticulitis large intestine   ? DVT (deep venous thrombosis) ([place]) 1991  ? "left calf; after 1st knee scope"  ? EROSIVE GASTRITIS 12/05/2008  ? Qualifier: Diagnosis of  By: Sidney Ace    ? Fungal esophagitis   ?  GERD 12/05/2008  ? Qualifier: Diagnosis of  By: Sidney Ace    ? GERD (gastroesophageal reflux disease)   ? Heart attack ([place]) 2010  ? BMS x 2 RCA  ? High cholesterol   ? Hyperlipidemia   ? Hypertension   ? Hypothyroidism   ? MRSA (methicillin resistant staph aureus) culture positive   ? Myocardial infarction [facility])   ? MYOCARDIAL INFARCTION, HX OF 12/05/2008  ? Qualifier: Diagnosis of  By: Sidney Ace    ? Obesity 12/05/2008  ? Qualifier:  Diagnosis of  By: Sidney Ace    ? Pneumonia   ? "several times"  ? PULMONARY NODULE, LEFT LOWER LOBE 12/05/2008  ? Qualifier: Diagnosis of  By: Sidney Ace    ? S/P angioplasty with stent 11/05/15 PCI DES to mLAD  11/06/2015  ? Schizo affective schizophrenia ([place]) 2002  ? SOB (shortness of breath) 08/22/2016  ? Status post cholecystectomy 09/25/2014  ?  ?Past Surgical History:  ?Procedure Laterality Date  ? APPENDECTOMY    ? CARDIAC CATHETERIZATION N/A 11/05/2015  ? Procedure: Left Heart Cath and Coronary Angiography;  Surgeon: Nelva Bush, MD;  Location: [facility];  Service: Cardiovascular;  Laterality: N/A;  ? CARDIAC CATHETERIZATION N/A 11/05/2015  ? Procedure: Coronary Stent Intervention;  Surgeon: Nelva Bush, MD;  Location: [facility];  Service: Cardiovascular;  Laterality: N/A;  ? CARDIAC CATHETERIZATION N/A 11/05/2015  ? Procedure: Intravascular Pressure Wire/FFR Study;  Surgeon: Nelva Bush, MD;  Location: [facility];  Service: Cardiovascular;  Laterality: N/A;  ? CORONARY ANGIOPLASTY WITH STENT PLACEMENT  2010  ? 90% RCA s/p BMS x 2, 50% LAD, 30% CFX, EF 45-50%  ? CORONARY ANGIOPLASTY WITH STENT PLACEMENT  11/05/2015  ? "1 today; makes a total of 4 stents" (11/05/2015)  ? INGUINAL HERNIA REPAIR Right   ? KNEE ARTHROSCOPY Left X 3  ? LAPAROSCOPIC CHOLECYSTECTOMY    ? TONSILLECTOMY AND ADENOIDECTOMY    ? UPPER GI ENDOSCOPY  05/24/2016  ? UGI ENDO INCLUDE ESOPHAGUS, STOMACH & DUODENUM &/OR JEJUNUM; DX W/WO COLLECTION SPECIMEN BY [facility] OR [place]; SURGEON :ALBERT San Morelle, MD LOCATION; [facility]  ? ?Family History:  ?Family History  ?Problem Relation Age of Onset  ? Heart Problems Mother   ?     52  ? Heart disease Father   ?     41  ? ?Family Psychiatric  History: not noted ?Social History:  ?Social History  ? ?Substance and Sexual Activity  ?Alcohol Use No  ?   ?Social History  ? ?Substance and Sexual Activity  ?Drug Use No  ?  ?Social History  ? ?Socioeconomic  History  ? Marital status: Divorced  ?  Spouse name: Not on file  ? Number of children: Not on file  ? Years of education: Not on file  ? Highest education level: Not on file  ?Occupational History  ? Occupation: Disabled  ?Tobacco Use  ? Smoking status: Every Day  ?  Years: 42.00  ?  Types: Cigarettes  ? Smokeless tobacco: Never  ? Tobacco comments:  ?  last attempt to quit 04/12/16, down to 6 cigs daily   ?Vaping Use  ? Vaping Use: Never used  ?Substance and Sexual Activity  ? Alcohol use: No  ? Drug use: No  ? Sexual activity: Yes  ?Other Topics Concern  ? Not on file  ?Social History Narrative  ? Lives alone in [place].  ? [facility]  ? Divorced  ? Smokes daily  ? Alcohol none  ? Full Code  ? ?Social Determinants of Health  ? ?Financial Resource Strain: Not on file  ?Food Insecurity: Not on file  ?Transportation Needs: Not on file  ?Physical Activity: Not on file  ?Stress: Not on file  ?Social Connections: Not on file  ? ?Additional Social History: ?  ? ?Allergies:   ?Allergies  ?Allergen Reactions  ? Rocephin [Ceftriaxone] Anaphylaxis  ? Zofran [Ondansetron Hcl] Swelling  ?  sts had tongue swelling and trouble breathing  ? Ampicillin   ?  Per MAR   ? Lipitor [Atorvastatin]   ?  Per MAR   ? Prochlorperazine Edisylate Other (See Comments)  ?  Childhood Reaction.  ? Toradol [Ketorolac Tromethamine] Nausea And Vomiting  ?  Pt states he is not allergic to ibuprofen  ? Tramadol Nausea And Vomiting  ?  Not listed on MAR   ? ? ?Labs:  ?Results for orders placed or performed during the hospital encounter of 06/18/21 (from the past 48 hour(s))  ?Comprehensive metabolic panel     Status: Abnormal  ? Collection Time: 06/18/21  6:01 PM  ?Result Value Ref Range  ? Sodium 134 (L) 135 - 145 mmol/L  ? Potassium 4.7 3.5 - 5.1 mmol/L  ? Chloride 99 98 - 111 mmol/L  ? CO2 25 22 - 32 mmol/L  ? Glucose, Bld 89 70 - 99 mg/dL  ?  Comment: Glucose reference range applies only to samples taken  after fasting for at least 8 hours.  ? BUN 29 (H) 8 - 23 mg/dL  ? Creatinine, Ser 1.26 (H) 0.61 - 1.24 mg/dL  ? Calcium 9.2 8.9 - 10.3 mg/dL  ? Total Protein 7.8 6.5 - 8.1 g/dL  ? Albumin 3.9 3.5 - 5.0 g/dL

## 2021-06-19 NOTE — ED Notes (Signed)
Patient alert this shift. Patient cooperative.   Patient medication compliant. Patient denied suicidal ideation at this time. Patient denied homicidal ideation at this time . ?

## 2021-06-20 DIAGNOSIS — F4323 Adjustment disorder with mixed anxiety and depressed mood: Secondary | ICD-10-CM | POA: Diagnosis not present

## 2021-06-20 LAB — CBG MONITORING, ED: Glucose-Capillary: 104 mg/dL — ABNORMAL HIGH (ref 70–99)

## 2021-06-20 NOTE — Progress Notes (Signed)
CSW spoke with [facility]'s DON Melissa, she is requesting documentation stating pt is psychiatrically cleared. Documents was faxed to facility yesterday. She is requesting documents to be emailed to her . CSW has emailed documents stating pt is psychiatrically cleared and denies Suicidal ideations. ? ?CSW contacted [facility] for transportation and was told they are no longer transporting for the day.  ? ?If [facility] refuse to transport pt will need to board until the. ? ?[name].[name], MSW, [place] ?[place] [facility] [place]  Transitions of Care ?Clinical Social Worker I ?Direct Dial: [phone]  Fax: [phone] ?[name]  ?

## 2021-06-20 NOTE — Progress Notes (Signed)
CSW received a call back from [facility]. CSW was told by the secretary Levada Dy that the administrator from [facility] has to be the one who approves patient to come back into the building. CSW was told they are not sure what time the administrator would be in today but would give him the message to call social work.  ?

## 2021-06-20 NOTE — Progress Notes (Signed)
CSW attempted  to contact [facility] twice no response unable to leave VM ,as no one picks up. TOC to follow. ? ?Arlie Solomons.Leanndra Pember, MSW, [place] ?[facility]  Transitions of Care ?Clinical Social Worker I ?Direct Dial: [phone]  Fax: [phone] ?[email or web address]  ?

## 2021-06-20 NOTE — Progress Notes (Signed)
It is unknown when patient can transfer back to [facility]. CSW called to speak with patients nurse and received no answer. CSW was told to call back later today. ?

## 2021-06-20 NOTE — Progress Notes (Signed)
CSW contacted [facility] in an attempt to speak with DON about pt's d/c, receptionist stated the administrator is in on another call and will call this wrier back. TOC to follow . ? ?Arlie Solomons.Jaimon Bugaj, MSW, [place] ?[facility] [place]  Transitions of Care ?Clinical Social Worker I ?Direct Dial: [phone]  Fax: [phone] ?[email or web address]  ?

## 2021-06-21 DIAGNOSIS — F4323 Adjustment disorder with mixed anxiety and depressed mood: Secondary | ICD-10-CM | POA: Diagnosis not present

## 2021-06-21 NOTE — Progress Notes (Signed)
Pt to d/c back to [facility] . Call report [phone]. ? ?Arlie Solomons.Paquita Printy, MSW, [place] ?[place]  Transitions of Care ?Clinical Social Worker I ?Direct Dial: [phone]  Fax: [phone] ?[email or web address]  ?

## 2021-06-21 NOTE — ED Provider Notes (Signed)
?  Physical Exam  ?BP (!) 90/52 Comment: provider aware of improved bp  Pulse 67   Temp 98.3 ?F (36.8 ?C) (Oral)   Resp 18   SpO2 95%  ? ?Physical Exam ? ?Procedures  ?Procedures ? ?ED Course / MDM  ?  ?Medical Decision Making ?Amount and/or Complexity of Data Reviewed ?Labs: ordered. ? ?Risk ?Prescription drug management. ? ? ?Received care of patient from previous providers.  Please see their notes for prior history, physical and care.  Briefly this is a 66 year old male who had presented with suicidal ideation, was medically and psychiatrically cleared.  [facility] was not able to take him back to his facility last night.  He did receive 20 mg of lisinopril, when his blood pressures had been 96 systolic on arrival.  Suspect hypotension this morning is likely secondary to receiving his blood pressure medications and pain medications.  There is no other indication of etiology on history reviewed in the chart, or in lab work.  We will continue to monitor his blood pressures this morning as he wakes, has breakfast, and feel if they are stable he is appropriate for discharge back to his facility.  We will discontinue his home lisinopril and have him follow-up with a blood pressure check with his physicians. ? ?He is asymptomatic. Denies CP/dyspnea/vomiting/diarrhea/bloody stools/lightheadedness. Suspect lower blood pressures related to medications. Recommend holding lisinopril, following up with PCP. Transported back to facility. ? ? ? ? ?  ?Gareth Morgan, MD ?06/21/21 2134 ? ?

## 2021-06-21 NOTE — ED Provider Notes (Signed)
Notified by nursing staff that patient's BP was low, reviewed patient's chart was here for suicidal ideations, he has been medically cleared and psychiatrically cleared, we are awaiting nursing facility to accept the patient.  He is on opioids for chronic pain, will withhold his second dose of oxycodone, till his BP is stabilized.  His BP is generally low, repeat BP is improving, likely initial low BPs from narcotic use.  ?  ?Marcello Fennel, PA-C ?06/21/21 1696 ? ?  ?Shanon Rosser, MD ?06/21/21 7893 ? ?

## 2021-06-21 NOTE — ED Notes (Signed)
Patients baseline BP runs on the lower side - aware of BP and notified patient that we would wait to check once more before giving another dose of PRN pain medication -  ?

## 2021-06-21 NOTE — ED Notes (Signed)
Patient DC d off unit to facility per provider. Patient alert, calm, cooperative, no s/s of distress. Patient denies suicidal ideation at this time. Patient denies homicidal ideation at this time.  DC information and belongings given to transport for facility    Patient off unit with NT.  Transported by a service. ?

## 2021-06-21 NOTE — ED Notes (Signed)
Tried to give report    was put on hold, no message was abel to be given  ? ?

## 2021-06-23 DIAGNOSIS — R45851 Suicidal ideations: Secondary | ICD-10-CM | POA: Diagnosis not present

## 2021-06-23 DIAGNOSIS — I130 Hypertensive heart and chronic kidney disease with heart failure and stage 1 through stage 4 chronic kidney disease, or unspecified chronic kidney disease: Secondary | ICD-10-CM | POA: Diagnosis not present

## 2021-06-23 DIAGNOSIS — F319 Bipolar disorder, unspecified: Secondary | ICD-10-CM | POA: Diagnosis not present

## 2021-06-23 DIAGNOSIS — F419 Anxiety disorder, unspecified: Secondary | ICD-10-CM | POA: Diagnosis not present

## 2021-06-24 DIAGNOSIS — F3132 Bipolar disorder, current episode depressed, moderate: Secondary | ICD-10-CM | POA: Diagnosis not present

## 2021-06-29 DIAGNOSIS — J441 Chronic obstructive pulmonary disease with (acute) exacerbation: Secondary | ICD-10-CM | POA: Diagnosis not present

## 2021-06-30 DIAGNOSIS — F419 Anxiety disorder, unspecified: Secondary | ICD-10-CM | POA: Diagnosis not present

## 2021-06-30 DIAGNOSIS — J441 Chronic obstructive pulmonary disease with (acute) exacerbation: Secondary | ICD-10-CM | POA: Diagnosis not present

## 2021-06-30 DIAGNOSIS — F319 Bipolar disorder, unspecified: Secondary | ICD-10-CM | POA: Diagnosis not present

## 2021-06-30 DIAGNOSIS — I130 Hypertensive heart and chronic kidney disease with heart failure and stage 1 through stage 4 chronic kidney disease, or unspecified chronic kidney disease: Secondary | ICD-10-CM | POA: Diagnosis not present

## 2021-07-01 DIAGNOSIS — J441 Chronic obstructive pulmonary disease with (acute) exacerbation: Secondary | ICD-10-CM | POA: Diagnosis not present

## 2021-07-02 DIAGNOSIS — J441 Chronic obstructive pulmonary disease with (acute) exacerbation: Secondary | ICD-10-CM | POA: Diagnosis not present

## 2021-07-02 DIAGNOSIS — I10 Essential (primary) hypertension: Secondary | ICD-10-CM | POA: Diagnosis not present

## 2021-07-02 DIAGNOSIS — G4700 Insomnia, unspecified: Secondary | ICD-10-CM | POA: Diagnosis not present

## 2021-07-02 DIAGNOSIS — Z79899 Other long term (current) drug therapy: Secondary | ICD-10-CM | POA: Diagnosis not present

## 2021-07-05 DIAGNOSIS — J441 Chronic obstructive pulmonary disease with (acute) exacerbation: Secondary | ICD-10-CM | POA: Diagnosis not present

## 2021-07-06 DIAGNOSIS — J441 Chronic obstructive pulmonary disease with (acute) exacerbation: Secondary | ICD-10-CM | POA: Diagnosis not present

## 2021-07-07 DIAGNOSIS — J441 Chronic obstructive pulmonary disease with (acute) exacerbation: Secondary | ICD-10-CM | POA: Diagnosis not present

## 2021-07-08 DIAGNOSIS — F3132 Bipolar disorder, current episode depressed, moderate: Secondary | ICD-10-CM | POA: Diagnosis not present

## 2021-07-09 DIAGNOSIS — E785 Hyperlipidemia, unspecified: Secondary | ICD-10-CM | POA: Diagnosis not present

## 2021-07-09 DIAGNOSIS — I130 Hypertensive heart and chronic kidney disease with heart failure and stage 1 through stage 4 chronic kidney disease, or unspecified chronic kidney disease: Secondary | ICD-10-CM | POA: Diagnosis not present

## 2021-07-10 DIAGNOSIS — J441 Chronic obstructive pulmonary disease with (acute) exacerbation: Secondary | ICD-10-CM | POA: Diagnosis not present

## 2021-07-12 DIAGNOSIS — G4700 Insomnia, unspecified: Secondary | ICD-10-CM | POA: Diagnosis not present

## 2021-07-12 DIAGNOSIS — J441 Chronic obstructive pulmonary disease with (acute) exacerbation: Secondary | ICD-10-CM | POA: Diagnosis not present

## 2021-07-13 DIAGNOSIS — M5136 Other intervertebral disc degeneration, lumbar region: Secondary | ICD-10-CM | POA: Diagnosis not present

## 2021-07-13 DIAGNOSIS — Z0130 Encounter for examination of blood pressure without abnormal findings: Secondary | ICD-10-CM | POA: Diagnosis not present

## 2021-07-13 DIAGNOSIS — Z0000 Encounter for general adult medical examination without abnormal findings: Secondary | ICD-10-CM | POA: Diagnosis not present

## 2021-07-13 DIAGNOSIS — Z79899 Other long term (current) drug therapy: Secondary | ICD-10-CM | POA: Diagnosis not present

## 2021-07-13 DIAGNOSIS — F1120 Opioid dependence, uncomplicated: Secondary | ICD-10-CM | POA: Diagnosis not present

## 2021-07-13 DIAGNOSIS — Z6834 Body mass index (BMI) 34.0-34.9, adult: Secondary | ICD-10-CM | POA: Diagnosis not present

## 2021-07-13 DIAGNOSIS — E119 Type 2 diabetes mellitus without complications: Secondary | ICD-10-CM | POA: Diagnosis not present

## 2021-07-13 DIAGNOSIS — M129 Arthropathy, unspecified: Secondary | ICD-10-CM | POA: Diagnosis not present

## 2021-07-13 DIAGNOSIS — J441 Chronic obstructive pulmonary disease with (acute) exacerbation: Secondary | ICD-10-CM | POA: Diagnosis not present

## 2021-07-13 DIAGNOSIS — I10 Essential (primary) hypertension: Secondary | ICD-10-CM | POA: Diagnosis not present

## 2021-07-14 DIAGNOSIS — J441 Chronic obstructive pulmonary disease with (acute) exacerbation: Secondary | ICD-10-CM | POA: Diagnosis not present

## 2021-07-15 DIAGNOSIS — Z79899 Other long term (current) drug therapy: Secondary | ICD-10-CM | POA: Diagnosis not present

## 2021-07-16 DIAGNOSIS — J441 Chronic obstructive pulmonary disease with (acute) exacerbation: Secondary | ICD-10-CM | POA: Diagnosis not present

## 2021-07-20 DIAGNOSIS — J441 Chronic obstructive pulmonary disease with (acute) exacerbation: Secondary | ICD-10-CM | POA: Diagnosis not present

## 2021-07-20 DIAGNOSIS — D508 Other iron deficiency anemias: Secondary | ICD-10-CM | POA: Diagnosis not present

## 2021-07-20 DIAGNOSIS — E119 Type 2 diabetes mellitus without complications: Secondary | ICD-10-CM | POA: Diagnosis not present

## 2021-07-20 DIAGNOSIS — F3160 Bipolar disorder, current episode mixed, unspecified: Secondary | ICD-10-CM | POA: Diagnosis not present

## 2021-07-20 DIAGNOSIS — Z79899 Other long term (current) drug therapy: Secondary | ICD-10-CM | POA: Diagnosis not present

## 2021-07-20 DIAGNOSIS — Z0130 Encounter for examination of blood pressure without abnormal findings: Secondary | ICD-10-CM | POA: Diagnosis not present

## 2021-07-20 DIAGNOSIS — Z6831 Body mass index (BMI) 31.0-31.9, adult: Secondary | ICD-10-CM | POA: Diagnosis not present

## 2021-07-20 DIAGNOSIS — F1120 Opioid dependence, uncomplicated: Secondary | ICD-10-CM | POA: Diagnosis not present

## 2021-07-22 DIAGNOSIS — J441 Chronic obstructive pulmonary disease with (acute) exacerbation: Secondary | ICD-10-CM | POA: Diagnosis not present

## 2021-07-23 DIAGNOSIS — J441 Chronic obstructive pulmonary disease with (acute) exacerbation: Secondary | ICD-10-CM | POA: Diagnosis not present

## 2021-07-24 DIAGNOSIS — J441 Chronic obstructive pulmonary disease with (acute) exacerbation: Secondary | ICD-10-CM | POA: Diagnosis not present

## 2021-07-26 DIAGNOSIS — J441 Chronic obstructive pulmonary disease with (acute) exacerbation: Secondary | ICD-10-CM | POA: Diagnosis not present

## 2021-07-26 DIAGNOSIS — F3132 Bipolar disorder, current episode depressed, moderate: Secondary | ICD-10-CM | POA: Diagnosis not present

## 2021-07-27 DIAGNOSIS — J441 Chronic obstructive pulmonary disease with (acute) exacerbation: Secondary | ICD-10-CM | POA: Diagnosis not present

## 2021-07-29 DIAGNOSIS — J441 Chronic obstructive pulmonary disease with (acute) exacerbation: Secondary | ICD-10-CM | POA: Diagnosis not present

## 2021-07-30 DIAGNOSIS — J441 Chronic obstructive pulmonary disease with (acute) exacerbation: Secondary | ICD-10-CM | POA: Diagnosis not present

## 2021-08-09 DIAGNOSIS — F3132 Bipolar disorder, current episode depressed, moderate: Secondary | ICD-10-CM | POA: Diagnosis not present

## 2021-08-17 DIAGNOSIS — Z6831 Body mass index (BMI) 31.0-31.9, adult: Secondary | ICD-10-CM | POA: Diagnosis not present

## 2021-08-17 DIAGNOSIS — F3160 Bipolar disorder, current episode mixed, unspecified: Secondary | ICD-10-CM | POA: Diagnosis not present

## 2021-08-17 DIAGNOSIS — Z0130 Encounter for examination of blood pressure without abnormal findings: Secondary | ICD-10-CM | POA: Diagnosis not present

## 2021-08-17 DIAGNOSIS — F1120 Opioid dependence, uncomplicated: Secondary | ICD-10-CM | POA: Diagnosis not present

## 2021-08-17 DIAGNOSIS — Z79899 Other long term (current) drug therapy: Secondary | ICD-10-CM | POA: Diagnosis not present

## 2021-08-17 DIAGNOSIS — E119 Type 2 diabetes mellitus without complications: Secondary | ICD-10-CM | POA: Diagnosis not present

## 2021-08-17 DIAGNOSIS — D508 Other iron deficiency anemias: Secondary | ICD-10-CM | POA: Diagnosis not present

## 2021-08-19 DIAGNOSIS — G4700 Insomnia, unspecified: Secondary | ICD-10-CM | POA: Diagnosis not present

## 2021-08-19 DIAGNOSIS — F319 Bipolar disorder, unspecified: Secondary | ICD-10-CM | POA: Diagnosis not present

## 2021-08-19 DIAGNOSIS — G8929 Other chronic pain: Secondary | ICD-10-CM | POA: Diagnosis not present

## 2021-08-23 DIAGNOSIS — F3132 Bipolar disorder, current episode depressed, moderate: Secondary | ICD-10-CM | POA: Diagnosis not present

## 2021-08-25 DIAGNOSIS — I517 Cardiomegaly: Secondary | ICD-10-CM | POA: Diagnosis not present

## 2021-08-25 DIAGNOSIS — R0602 Shortness of breath: Secondary | ICD-10-CM | POA: Diagnosis not present

## 2021-08-25 DIAGNOSIS — R0981 Nasal congestion: Secondary | ICD-10-CM | POA: Diagnosis not present

## 2021-08-25 DIAGNOSIS — J449 Chronic obstructive pulmonary disease, unspecified: Secondary | ICD-10-CM | POA: Diagnosis not present

## 2021-08-26 DIAGNOSIS — J449 Chronic obstructive pulmonary disease, unspecified: Secondary | ICD-10-CM | POA: Diagnosis not present

## 2021-08-26 DIAGNOSIS — G8929 Other chronic pain: Secondary | ICD-10-CM | POA: Diagnosis not present

## 2021-08-26 DIAGNOSIS — F17200 Nicotine dependence, unspecified, uncomplicated: Secondary | ICD-10-CM | POA: Diagnosis not present

## 2021-08-26 DIAGNOSIS — F319 Bipolar disorder, unspecified: Secondary | ICD-10-CM | POA: Diagnosis not present

## 2021-09-06 DIAGNOSIS — F3132 Bipolar disorder, current episode depressed, moderate: Secondary | ICD-10-CM | POA: Diagnosis not present

## 2021-09-09 DIAGNOSIS — J449 Chronic obstructive pulmonary disease, unspecified: Secondary | ICD-10-CM | POA: Diagnosis not present

## 2021-09-16 DIAGNOSIS — M549 Dorsalgia, unspecified: Secondary | ICD-10-CM | POA: Diagnosis not present

## 2021-09-16 DIAGNOSIS — Z0130 Encounter for examination of blood pressure without abnormal findings: Secondary | ICD-10-CM | POA: Diagnosis not present

## 2021-09-16 DIAGNOSIS — F3160 Bipolar disorder, current episode mixed, unspecified: Secondary | ICD-10-CM | POA: Diagnosis not present

## 2021-09-16 DIAGNOSIS — E119 Type 2 diabetes mellitus without complications: Secondary | ICD-10-CM | POA: Diagnosis not present

## 2021-09-16 DIAGNOSIS — J449 Chronic obstructive pulmonary disease, unspecified: Secondary | ICD-10-CM | POA: Diagnosis not present

## 2021-09-16 DIAGNOSIS — G8929 Other chronic pain: Secondary | ICD-10-CM | POA: Diagnosis not present

## 2021-09-16 DIAGNOSIS — M25551 Pain in right hip: Secondary | ICD-10-CM | POA: Diagnosis not present

## 2021-09-16 DIAGNOSIS — D508 Other iron deficiency anemias: Secondary | ICD-10-CM | POA: Diagnosis not present

## 2021-09-16 DIAGNOSIS — Z6831 Body mass index (BMI) 31.0-31.9, adult: Secondary | ICD-10-CM | POA: Diagnosis not present

## 2021-09-16 DIAGNOSIS — Z79899 Other long term (current) drug therapy: Secondary | ICD-10-CM | POA: Diagnosis not present

## 2021-09-16 DIAGNOSIS — G894 Chronic pain syndrome: Secondary | ICD-10-CM | POA: Diagnosis not present

## 2021-09-17 DIAGNOSIS — E119 Type 2 diabetes mellitus without complications: Secondary | ICD-10-CM | POA: Diagnosis not present

## 2021-09-17 DIAGNOSIS — I509 Heart failure, unspecified: Secondary | ICD-10-CM | POA: Diagnosis not present

## 2021-09-17 DIAGNOSIS — R52 Pain, unspecified: Secondary | ICD-10-CM | POA: Diagnosis not present

## 2021-09-17 DIAGNOSIS — J449 Chronic obstructive pulmonary disease, unspecified: Secondary | ICD-10-CM | POA: Diagnosis not present

## 2021-09-20 DIAGNOSIS — Z79899 Other long term (current) drug therapy: Secondary | ICD-10-CM | POA: Diagnosis not present

## 2021-09-21 DIAGNOSIS — F3132 Bipolar disorder, current episode depressed, moderate: Secondary | ICD-10-CM | POA: Diagnosis not present

## 2021-09-27 DIAGNOSIS — F411 Generalized anxiety disorder: Secondary | ICD-10-CM | POA: Diagnosis not present

## 2021-10-06 DIAGNOSIS — J449 Chronic obstructive pulmonary disease, unspecified: Secondary | ICD-10-CM | POA: Diagnosis not present

## 2021-10-06 DIAGNOSIS — J441 Chronic obstructive pulmonary disease with (acute) exacerbation: Secondary | ICD-10-CM | POA: Diagnosis not present

## 2021-10-06 DIAGNOSIS — F419 Anxiety disorder, unspecified: Secondary | ICD-10-CM | POA: Diagnosis not present

## 2021-10-06 DIAGNOSIS — R262 Difficulty in walking, not elsewhere classified: Secondary | ICD-10-CM | POA: Diagnosis not present

## 2021-10-06 DIAGNOSIS — R2689 Other abnormalities of gait and mobility: Secondary | ICD-10-CM | POA: Diagnosis not present

## 2021-10-06 DIAGNOSIS — M6281 Muscle weakness (generalized): Secondary | ICD-10-CM | POA: Diagnosis not present

## 2021-10-06 DIAGNOSIS — F3132 Bipolar disorder, current episode depressed, moderate: Secondary | ICD-10-CM | POA: Diagnosis not present

## 2021-10-06 DIAGNOSIS — I10 Essential (primary) hypertension: Secondary | ICD-10-CM | POA: Diagnosis not present

## 2021-10-07 DIAGNOSIS — M6281 Muscle weakness (generalized): Secondary | ICD-10-CM | POA: Diagnosis not present

## 2021-10-07 DIAGNOSIS — J441 Chronic obstructive pulmonary disease with (acute) exacerbation: Secondary | ICD-10-CM | POA: Diagnosis not present

## 2021-10-07 DIAGNOSIS — R2689 Other abnormalities of gait and mobility: Secondary | ICD-10-CM | POA: Diagnosis not present

## 2021-10-07 DIAGNOSIS — R262 Difficulty in walking, not elsewhere classified: Secondary | ICD-10-CM | POA: Diagnosis not present

## 2021-10-08 DIAGNOSIS — R2689 Other abnormalities of gait and mobility: Secondary | ICD-10-CM | POA: Diagnosis not present

## 2021-10-08 DIAGNOSIS — R262 Difficulty in walking, not elsewhere classified: Secondary | ICD-10-CM | POA: Diagnosis not present

## 2021-10-08 DIAGNOSIS — M6281 Muscle weakness (generalized): Secondary | ICD-10-CM | POA: Diagnosis not present

## 2021-10-08 DIAGNOSIS — J441 Chronic obstructive pulmonary disease with (acute) exacerbation: Secondary | ICD-10-CM | POA: Diagnosis not present

## 2021-10-11 DIAGNOSIS — R2689 Other abnormalities of gait and mobility: Secondary | ICD-10-CM | POA: Diagnosis not present

## 2021-10-11 DIAGNOSIS — J449 Chronic obstructive pulmonary disease, unspecified: Secondary | ICD-10-CM | POA: Diagnosis not present

## 2021-10-11 DIAGNOSIS — R042 Hemoptysis: Secondary | ICD-10-CM | POA: Diagnosis not present

## 2021-10-11 DIAGNOSIS — Z720 Tobacco use: Secondary | ICD-10-CM | POA: Diagnosis not present

## 2021-10-11 DIAGNOSIS — M6281 Muscle weakness (generalized): Secondary | ICD-10-CM | POA: Diagnosis not present

## 2021-10-11 DIAGNOSIS — R059 Cough, unspecified: Secondary | ICD-10-CM | POA: Diagnosis not present

## 2021-10-11 DIAGNOSIS — J441 Chronic obstructive pulmonary disease with (acute) exacerbation: Secondary | ICD-10-CM | POA: Diagnosis not present

## 2021-10-11 DIAGNOSIS — R262 Difficulty in walking, not elsewhere classified: Secondary | ICD-10-CM | POA: Diagnosis not present

## 2021-10-12 DIAGNOSIS — Z79899 Other long term (current) drug therapy: Secondary | ICD-10-CM | POA: Diagnosis not present

## 2021-10-12 DIAGNOSIS — M6281 Muscle weakness (generalized): Secondary | ICD-10-CM | POA: Diagnosis not present

## 2021-10-12 DIAGNOSIS — I10 Essential (primary) hypertension: Secondary | ICD-10-CM | POA: Diagnosis not present

## 2021-10-12 DIAGNOSIS — R262 Difficulty in walking, not elsewhere classified: Secondary | ICD-10-CM | POA: Diagnosis not present

## 2021-10-12 DIAGNOSIS — R2689 Other abnormalities of gait and mobility: Secondary | ICD-10-CM | POA: Diagnosis not present

## 2021-10-12 DIAGNOSIS — J441 Chronic obstructive pulmonary disease with (acute) exacerbation: Secondary | ICD-10-CM | POA: Diagnosis not present

## 2021-10-12 DIAGNOSIS — J181 Lobar pneumonia, unspecified organism: Secondary | ICD-10-CM | POA: Diagnosis not present

## 2021-10-12 DIAGNOSIS — J449 Chronic obstructive pulmonary disease, unspecified: Secondary | ICD-10-CM | POA: Diagnosis not present

## 2021-10-13 DIAGNOSIS — M6281 Muscle weakness (generalized): Secondary | ICD-10-CM | POA: Diagnosis not present

## 2021-10-13 DIAGNOSIS — R262 Difficulty in walking, not elsewhere classified: Secondary | ICD-10-CM | POA: Diagnosis not present

## 2021-10-13 DIAGNOSIS — R2689 Other abnormalities of gait and mobility: Secondary | ICD-10-CM | POA: Diagnosis not present

## 2021-10-13 DIAGNOSIS — J441 Chronic obstructive pulmonary disease with (acute) exacerbation: Secondary | ICD-10-CM | POA: Diagnosis not present

## 2021-10-14 DIAGNOSIS — R262 Difficulty in walking, not elsewhere classified: Secondary | ICD-10-CM | POA: Diagnosis not present

## 2021-10-14 DIAGNOSIS — M6281 Muscle weakness (generalized): Secondary | ICD-10-CM | POA: Diagnosis not present

## 2021-10-14 DIAGNOSIS — G894 Chronic pain syndrome: Secondary | ICD-10-CM | POA: Diagnosis not present

## 2021-10-14 DIAGNOSIS — R2689 Other abnormalities of gait and mobility: Secondary | ICD-10-CM | POA: Diagnosis not present

## 2021-10-14 DIAGNOSIS — J181 Lobar pneumonia, unspecified organism: Secondary | ICD-10-CM | POA: Diagnosis not present

## 2021-10-14 DIAGNOSIS — J441 Chronic obstructive pulmonary disease with (acute) exacerbation: Secondary | ICD-10-CM | POA: Diagnosis not present

## 2021-10-15 DIAGNOSIS — E871 Hypo-osmolality and hyponatremia: Secondary | ICD-10-CM | POA: Diagnosis not present

## 2021-10-15 DIAGNOSIS — M6281 Muscle weakness (generalized): Secondary | ICD-10-CM | POA: Diagnosis not present

## 2021-10-15 DIAGNOSIS — I10 Essential (primary) hypertension: Secondary | ICD-10-CM | POA: Diagnosis not present

## 2021-10-15 DIAGNOSIS — R2689 Other abnormalities of gait and mobility: Secondary | ICD-10-CM | POA: Diagnosis not present

## 2021-10-15 DIAGNOSIS — D649 Anemia, unspecified: Secondary | ICD-10-CM | POA: Diagnosis not present

## 2021-10-15 DIAGNOSIS — R262 Difficulty in walking, not elsewhere classified: Secondary | ICD-10-CM | POA: Diagnosis not present

## 2021-10-15 DIAGNOSIS — Z79899 Other long term (current) drug therapy: Secondary | ICD-10-CM | POA: Diagnosis not present

## 2021-10-15 DIAGNOSIS — J441 Chronic obstructive pulmonary disease with (acute) exacerbation: Secondary | ICD-10-CM | POA: Diagnosis not present

## 2021-10-15 DIAGNOSIS — J181 Lobar pneumonia, unspecified organism: Secondary | ICD-10-CM | POA: Diagnosis not present

## 2021-10-15 DIAGNOSIS — Z712 Person consulting for explanation of examination or test findings: Secondary | ICD-10-CM | POA: Diagnosis not present

## 2021-10-17 DIAGNOSIS — R262 Difficulty in walking, not elsewhere classified: Secondary | ICD-10-CM | POA: Diagnosis not present

## 2021-10-17 DIAGNOSIS — R2689 Other abnormalities of gait and mobility: Secondary | ICD-10-CM | POA: Diagnosis not present

## 2021-10-17 DIAGNOSIS — M6281 Muscle weakness (generalized): Secondary | ICD-10-CM | POA: Diagnosis not present

## 2021-10-17 DIAGNOSIS — J441 Chronic obstructive pulmonary disease with (acute) exacerbation: Secondary | ICD-10-CM | POA: Diagnosis not present

## 2021-10-18 DIAGNOSIS — J181 Lobar pneumonia, unspecified organism: Secondary | ICD-10-CM | POA: Diagnosis not present

## 2021-10-18 DIAGNOSIS — F419 Anxiety disorder, unspecified: Secondary | ICD-10-CM | POA: Diagnosis not present

## 2021-10-19 DIAGNOSIS — J441 Chronic obstructive pulmonary disease with (acute) exacerbation: Secondary | ICD-10-CM | POA: Diagnosis not present

## 2021-10-19 DIAGNOSIS — R2689 Other abnormalities of gait and mobility: Secondary | ICD-10-CM | POA: Diagnosis not present

## 2021-10-19 DIAGNOSIS — R262 Difficulty in walking, not elsewhere classified: Secondary | ICD-10-CM | POA: Diagnosis not present

## 2021-10-19 DIAGNOSIS — M6281 Muscle weakness (generalized): Secondary | ICD-10-CM | POA: Diagnosis not present

## 2021-10-20 DIAGNOSIS — R2689 Other abnormalities of gait and mobility: Secondary | ICD-10-CM | POA: Diagnosis not present

## 2021-10-20 DIAGNOSIS — J441 Chronic obstructive pulmonary disease with (acute) exacerbation: Secondary | ICD-10-CM | POA: Diagnosis not present

## 2021-10-20 DIAGNOSIS — M6281 Muscle weakness (generalized): Secondary | ICD-10-CM | POA: Diagnosis not present

## 2021-10-20 DIAGNOSIS — R262 Difficulty in walking, not elsewhere classified: Secondary | ICD-10-CM | POA: Diagnosis not present

## 2021-10-21 DIAGNOSIS — R2689 Other abnormalities of gait and mobility: Secondary | ICD-10-CM | POA: Diagnosis not present

## 2021-10-21 DIAGNOSIS — M6281 Muscle weakness (generalized): Secondary | ICD-10-CM | POA: Diagnosis not present

## 2021-10-21 DIAGNOSIS — J441 Chronic obstructive pulmonary disease with (acute) exacerbation: Secondary | ICD-10-CM | POA: Diagnosis not present

## 2021-10-21 DIAGNOSIS — R262 Difficulty in walking, not elsewhere classified: Secondary | ICD-10-CM | POA: Diagnosis not present

## 2021-10-22 DIAGNOSIS — M6281 Muscle weakness (generalized): Secondary | ICD-10-CM | POA: Diagnosis not present

## 2021-10-22 DIAGNOSIS — D649 Anemia, unspecified: Secondary | ICD-10-CM | POA: Diagnosis not present

## 2021-10-22 DIAGNOSIS — E119 Type 2 diabetes mellitus without complications: Secondary | ICD-10-CM | POA: Diagnosis not present

## 2021-10-22 DIAGNOSIS — D72829 Elevated white blood cell count, unspecified: Secondary | ICD-10-CM | POA: Diagnosis not present

## 2021-10-22 DIAGNOSIS — E0821 Diabetes mellitus due to underlying condition with diabetic nephropathy: Secondary | ICD-10-CM | POA: Diagnosis not present

## 2021-10-22 DIAGNOSIS — J441 Chronic obstructive pulmonary disease with (acute) exacerbation: Secondary | ICD-10-CM | POA: Diagnosis not present

## 2021-10-22 DIAGNOSIS — I10 Essential (primary) hypertension: Secondary | ICD-10-CM | POA: Diagnosis not present

## 2021-10-22 DIAGNOSIS — R262 Difficulty in walking, not elsewhere classified: Secondary | ICD-10-CM | POA: Diagnosis not present

## 2021-10-22 DIAGNOSIS — E039 Hypothyroidism, unspecified: Secondary | ICD-10-CM | POA: Diagnosis not present

## 2021-10-22 DIAGNOSIS — R2689 Other abnormalities of gait and mobility: Secondary | ICD-10-CM | POA: Diagnosis not present

## 2021-10-24 DIAGNOSIS — R059 Cough, unspecified: Secondary | ICD-10-CM | POA: Diagnosis not present

## 2021-10-26 ENCOUNTER — Ambulatory Visit (INDEPENDENT_AMBULATORY_CARE_PROVIDER_SITE_OTHER): Payer: Medicare HMO | Admitting: Nurse Practitioner

## 2021-10-26 ENCOUNTER — Ambulatory Visit (HOSPITAL_COMMUNITY): Payer: Medicare HMO

## 2021-10-26 ENCOUNTER — Ambulatory Visit (HOSPITAL_COMMUNITY)
Admission: RE | Admit: 2021-10-26 | Discharge: 2021-10-26 | Disposition: A | Discharge status: Home / Self Care | Payer: Medicare HMO | Source: Ambulatory Visit | Attending: Nurse Practitioner | Admitting: Nurse Practitioner

## 2021-10-26 ENCOUNTER — Encounter: Payer: Self-pay | Admitting: Nurse Practitioner

## 2021-10-26 ENCOUNTER — Encounter (HOSPITAL_COMMUNITY): Payer: Self-pay

## 2021-10-26 VITALS — BP 116/62 | HR 106 | Ht 72.0 in | Wt 227.0 lb

## 2021-10-26 DIAGNOSIS — J449 Chronic obstructive pulmonary disease, unspecified: Secondary | ICD-10-CM | POA: Diagnosis not present

## 2021-10-26 DIAGNOSIS — J90 Pleural effusion, not elsewhere classified: Secondary | ICD-10-CM | POA: Diagnosis not present

## 2021-10-26 DIAGNOSIS — J441 Chronic obstructive pulmonary disease with (acute) exacerbation: Secondary | ICD-10-CM | POA: Diagnosis not present

## 2021-10-26 DIAGNOSIS — I25118 Atherosclerotic heart disease of native coronary artery with other forms of angina pectoris: Secondary | ICD-10-CM

## 2021-10-26 DIAGNOSIS — R918 Other nonspecific abnormal finding of lung field: Secondary | ICD-10-CM | POA: Insufficient documentation

## 2021-10-26 DIAGNOSIS — R32 Unspecified urinary incontinence: Secondary | ICD-10-CM | POA: Diagnosis not present

## 2021-10-26 LAB — BASIC METABOLIC PANEL
BUN: 14 mg/dL (ref 6–23)
CO2: 24 mEq/L (ref 19–32)
Calcium: 9.1 mg/dL (ref 8.4–10.5)
Chloride: 94 mEq/L — ABNORMAL LOW (ref 96–112)
Creatinine, Ser: 0.88 mg/dL (ref 0.40–1.50)
GFR: 89.11 mL/min (ref >60.00)
Glucose, Bld: 121 mg/dL — ABNORMAL HIGH (ref 70–99)
Potassium: 3.6 mEq/L (ref 3.5–5.1)
Sodium: 132 mEq/L — ABNORMAL LOW (ref 135–145)

## 2021-10-26 MED ORDER — IOHEXOL 300 MG/ML  SOLN
75.0000 mL | Freq: Once | INTRAMUSCULAR | Status: AC | PRN
  Administered 2021-10-26: 75 mL via INTRAVENOUS

## 2021-10-26 MED ORDER — SODIUM CHLORIDE (PF) 0.9 % IJ SOLN
INTRAMUSCULAR | Status: AC
  Filled 2021-10-26: qty 50

## 2021-10-26 MED ORDER — TRELEGY ELLIPTA 200-62.5-25 MCG/ACT IN AEPB: 1.0000 | INHALATION_SPRAY | Freq: Every day | RESPIRATORY_TRACT | 0 refills | Status: DC

## 2021-10-26 NOTE — Assessment & Plan Note (Signed)
Episode of nocturnal incontinence. He has also had urinary urgency/increased frequency. He's on levaquin, which would cover for UTI but we will check urinalysis/culture today.

## 2021-10-26 NOTE — Progress Notes (Signed)
_0  ID: [name], male    DOB: 1955/09/23, 66 y.o.   MRN: [id number]  Chief Complaint  Patient presents with   Follow-up    Pt states that he is having breathing issues, lower back pain, and kidney issues. Pt states his breathing is his COPD and he is SOB. Pt is on Bevespi daily and Albuterol inhaler and nebs as needed     Referring provider: [name], MD  HPI: 66 year old male, active smoker, nursing home resident followed for COPD and intermittent hemoptysis. He was seen for initial consult with [name] 03/17/2021 for evaluation of hemoptysis. Last seen in office 04/07/2021 by [name],NP. Past medical history significant for HTN, hx of MI, CAD, GERD, hypothyroid, DM II, HLD, arthritis, schizophrenia, depression, anxiety.   TEST/EVENTS:  04/05/2021 CT chest wo contrast: advanced 3 vessel CAD. There is a 3.3x3.4 cm rounded masslike consolidation involving the right hilar/infrahilar region, suspicious for malignancy. Partial consolidative changes in the RLL, which appear to represents postobstructive atelectasis or pneumonia. There are small scattered ground-glass densities in the RUL, could be atelectasis. 9 mm LLL nodule which is stable since 2016.  04/07/2021: OV with [name]. Hospitalized 2 months ago for 13 days and treated for LLL pna with abx. Intermittent hemoptysis since so he was treated again with levaquin course. Doing somewhat better since this with decreased cough. No hemoptysis x 1 week. CT chest on 04/05/2021 showed a 3.3x3.4 cm rounded masslike consolidation involving the right hilar region, suspicious for malignancy. There was occlusion of the bronchus intermedius with partial RLL postobstructive atelectasis/pna. Reviewed CT results; discussed next steps with bronchoscopy. Bronch scheduled for 2/24. PET scan ordered. Continued on Bevespi for COPD maintenance. Smoking cessation strongly advised.  Bronchoscopy ended up being canceled d/t requirement by anesthesia  for cardiology clearance. Pt then declined further treatment upon follow up in March 2023.   10/26/2021: Today - acute Patient presents today for acute visit. He has been struggling with his breathing for many years; feels as though it has been worse the past few months. He was unclear about the series of events but it sounds like he is being treated for AECOPD by the nursing home physician with levaquin. His cough has been unchanged; productive for many months with cream sputum. He will occasionally have pink tinged sputum, which had resolved when he was here in february 2023 but he reports that it started back a few weeks after. He notices an occasional wheeze, which he will use his rescue for. Use anywhere between daily to a few times a week. He's unsure when but they changed him from Theresa to Advair diskus. Does get good benefit from inhaler use. He has been struggling with his appetite. He's worried his kidneys are failing because he had an incontinence episode last night while he was sleeping. No hematuria, dysuria. Does struggle with urgency/frequency. Denies fevers, chills, orthopnea, leg swelling. He has lost around 35 pounds since he was here last. He has not seen his cardiologist in a few years. He does wish to discuss next steps and figure out what he needs to do regarding the mass in his chest.   Allergies  Allergen Reactions   Rocephin [Ceftriaxone] Anaphylaxis   Zofran [Ondansetron Hcl] Swelling    sts had tongue swelling and trouble breathing   Ampicillin     Per MAR    Lipitor [Atorvastatin]     Per MAR    Prochlorperazine Edisylate Other (See Comments)    Childhood  Reaction.   Toradol [Ketorolac Tromethamine] Nausea And Vomiting    Pt states he is not allergic to ibuprofen   Tramadol Nausea And Vomiting    Not listed on MAR     Immunization History  Administered Date(s) Administered   Influenza Nasal 11/23/2015   Influenza, Seasonal, Injecte, Preservative Fre 11/22/2010,  12/12/2012, 02/15/2014   Influenza,inj,Quad PF,6+ Mos 11/26/2012, 01/17/2015, 11/06/2015   Influenza-Unspecified 02/26/2014, 01/05/2021   PPD Test 07/01/2016   Pneumococcal Polysaccharide-23 02/22/2008, 11/26/2012, 12/12/2012    Past Medical History:  Diagnosis Date   Anginal pain ([place])    Anxiety    Arthritis    "right hip; herniated L4-5" (11/05/2015)   Asthma    Bipolar 1 disorder (HCC)    CHF (congestive heart failure) (HCC)    Chronic bronchitis (HCC)    Chronic lower back pain    Chronic pain disorder    Community acquired pneumonia 10/30/2016   COPD (chronic obstructive pulmonary disease) ([place])    Coronary artery disease    Coronary atherosclerosis 12/05/2008   Qualifier: Diagnosis of  By: Jaramillo, [place], HX OF 12/05/2008   Qualifier: Diagnosis of  By: Jaramillo, [place], MAJOR 12/05/2008   Qualifier: Diagnosis of  By: Sidney Ace     Diabetes mellitus type 2 with complications ([place]) 8/65/7846   Diverticulitis large intestine    DVT (deep venous thrombosis) ([place]) 1991   "left calf; after 1st knee scope"   EROSIVE GASTRITIS 12/05/2008   Qualifier: Diagnosis of  By: Sidney Ace     Fungal esophagitis    GERD 12/05/2008   Qualifier: Diagnosis of  By: Sidney Ace     GERD (gastroesophageal reflux disease)    Heart attack ([place]) 2010   BMS x 2 RCA   High cholesterol    Hyperlipidemia    Hypertension    Hypothyroidism    MRSA (methicillin resistant staph aureus) culture positive    Myocardial infarction ([place])    MYOCARDIAL INFARCTION, HX OF 12/05/2008   Qualifier: Diagnosis of  By: Sidney Ace     Obesity 12/05/2008   Qualifier: Diagnosis of  By: Sidney Ace     Pneumonia    "several times"   PULMONARY NODULE, LEFT LOWER LOBE 12/05/2008   Qualifier: Diagnosis of  By: Sidney Ace     S/P angioplasty with stent 11/05/15 PCI DES to mLAD  11/06/2015   Schizo affective schizophrenia ([place]) 2002   SOB (shortness  of breath) 08/22/2016   Status post cholecystectomy 09/25/2014    Tobacco History: Social History   Tobacco Use  Smoking Status Former   Years: 42.00   Types: Cigarettes   Quit date: 10/12/2021   Years since quitting: 0.0  Smokeless Tobacco Never   Counseling given: Not Answered   Outpatient Medications Prior to Visit  Medication Sig Dispense Refill   acetaminophen (TYLENOL) 325 MG tablet Take 650 mg by mouth every 6 (six) hours as needed for moderate pain.     albuterol (PROVENTIL) (2.5 MG/3ML) 0.083% nebulizer solution Take 2.5 mg by nebulization every 6 (six) hours as needed for wheezing or shortness of breath.     albuterol (VENTOLIN HFA) 108 (90 Base) MCG/ACT inhaler Inhale 2 puffs into the lungs every 6 (six) hours as needed for wheezing or shortness of breath.     allopurinol (ZYLOPRIM) 100 MG tablet Take 100 mg by mouth daily.     alprazolam (XANAX) 2 MG tablet Take 2 mg  by mouth once as needed (for acute anxiety and stress for 1 day monitor sedation).     cyclobenzaprine (FLEXERIL) 10 MG tablet Take 10 mg by mouth 3 (three) times daily.     furosemide (LASIX) 20 MG tablet Take 20 mg by mouth daily.     gabapentin (NEURONTIN) 400 MG capsule Take 400 mg by mouth 3 (three) times daily.     Glucagon, rDNA, (GLUCAGON EMERGENCY) 1 MG KIT Inject 1 mg as directed as needed (hypoglycemia).     HUMALOG KWIKPEN 100 UNIT/ML KwikPen Inject 2-10 Units into the skin in the morning, at noon, in the evening, and at bedtime. Per Sliding Scale     iron polysaccharides (NIFEREX) 150 MG capsule Take 150 mg by mouth daily. Nu-Iron     levothyroxine (SYNTHROID) 75 MCG tablet Take 75 mcg by mouth daily before breakfast.     LORazepam (ATIVAN) 2 MG tablet Take 2 mg by mouth every 12 (twelve) hours.     metFORMIN (GLUCOPHAGE) 500 MG tablet Take 500 mg by mouth 2 (two) times daily with a meal.     metoprolol succinate (TOPROL-XL) 25 MG 24 hr tablet Take 25 mg by mouth daily.     omeprazole (PRILOSEC) 20  MG capsule Take 20 mg by mouth every morning.     Oxycodone HCl 10 MG TABS Take 10 mg by mouth every 4 (four) hours as needed (pain).     polyethylene glycol (MIRALAX / GLYCOLAX) packet Take 17 g by mouth daily as needed for mild constipation.      pravastatin (PRAVACHOL) 80 MG tablet Take 80 mg by mouth at bedtime.     QUEtiapine (SEROQUEL) 100 MG tablet Take 100 mg by mouth at bedtime.     temazepam (RESTORIL) 30 MG capsule Take 30 mg by mouth at bedtime.     therapeutic multivitamin-minerals (THERAGRAN-M) tablet Take 1 tablet by mouth daily.     ticagrelor (BRILINTA) 90 MG TABS tablet Take 90 mg by mouth 2 (two) times daily.     venlafaxine (EFFEXOR) 75 MG tablet Take 75 mg by mouth daily.      Glycopyrrolate-Formoterol (BEVESPI AEROSPHERE) 9-4.8 MCG/ACT AERO Inhale 2 puffs into the lungs 2 (two) times daily.     No facility-administered medications prior to visit.     Review of Systems:   Constitutional: No fevers, chills, night sweats. +weight loss, fatigue, lassitude. HEENT: No headaches, difficulty swallowing, tooth/dental problems, or sore throat. No sneezing, itching, ear ache, nasal congestion, or post nasal drip CV:  No chest pain, orthopnea, PND, swelling in lower extremities, anasarca, dizziness, palpitations, syncope Resp: +shortness of breath with exertion; productive cough; wheezing; intermittent hemoptysis. No chest wall deformity GI:  +decreased appetite. No heartburn, indigestion, abdominal pain, nausea, vomiting, diarrhea, change in bowel habits, bloody stools.  GU: +urgency/increased frequency; incontinence episode x 1. No dysuria, change in color of urine.  No flank pain, no hematuria  Neuro: No dizziness or lightheadedness.  Psych: No increased depression or anxiety. Mood stable.     Physical Exam:  BP 116/62 (BP Location: Left Arm, Patient Position: Sitting, Cuff Size: Normal)   Pulse (!) 106   Ht 6' (1.829 m)   Wt 227 lb (103 kg)   SpO2 91%   BMI 30.79  kg/m   GEN: Pleasant, interactive, chronically-ill appearing; in no acute distress. HEENT:  Normocephalic and atraumatic. PERRLA. Sclera white. Nasal turbinates pink, moist and patent bilaterally. No rhinorrhea present. Oropharynx pink and moist, without exudate or edema.  No lesions, ulcerations, or postnasal drip.  NECK:  Supple w/ fair ROM. No lymphadenopathy.   CV: RRR, no m/r/g, no peripheral edema. Pulses intact, +2 bilaterally. No cyanosis, pallor or clubbing. PULMONARY:  Unlabored, regular breathing. Diminished bilaterally A&P w/o wheezes/rales/rhonchi. No accessory muscle use. No dullness to percussion. GI: BS present and normoactive. Soft, non-tender to palpation. No CVA tenderness. MSK: No erythema, warmth or tenderness.  Neuro: A/Ox3. No focal deficits noted.   Skin: Warm, no lesions or rashe Psych: Normal affect and behavior. Judgement and thought content appropriate.     Lab Results:  CBC    Component Value Date/Time   WBC 10.3 06/18/2021 1801   RBC 3.39 (L) 06/18/2021 1801   HGB 9.6 (L) 06/18/2021 1801   HCT 28.8 (L) 06/18/2021 1801   PLT 251 06/18/2021 1801   MCV 85.0 06/18/2021 1801   MCH 28.3 06/18/2021 1801   MCHC 33.3 06/18/2021 1801   RDW 15.4 06/18/2021 1801   LYMPHSABS 3.0 06/18/2021 1801   MONOABS 0.7 06/18/2021 1801   EOSABS 0.2 06/18/2021 1801   BASOSABS 0.0 06/18/2021 1801    BMET    Component Value Date/Time   NA 134 (L) 06/18/2021 1801   NA 138 01/21/2017 0000   K 4.7 06/18/2021 1801   CL 99 06/18/2021 1801   CO2 25 06/18/2021 1801   GLUCOSE 89 06/18/2021 1801   BUN 29 (H) 06/18/2021 1801   BUN 16 01/21/2017 0000   CREATININE 1.26 (H) 06/18/2021 1801   CALCIUM 9.2 06/18/2021 1801   GFRNONAA >60 06/18/2021 1801   GFRAA >60 12/11/2016 1443    BNP    Component Value Date/Time   BNP 7.3 11/09/2015 0523     Imaging:  No results found.        No data to display          No results found for:  "NITRICOXIDE"      Assessment & Plan:   Lung mass He is a poor historian, significant psychiatric history. Story seems to be that he had stopped coughing up blood so he decided to postpone further intervention. He has developed worsening respiratory symptoms, recurrent intermittent hemoptysis, and weight loss over the past few months. We had a lengthy discussion about his lung mass and high suspicion that this is cancerous. We also discussed that his current respiratory symptoms are likely related to this. He was willing to undergo further evaluation/workup. STAT CT chest ordered for restaging. I sent urgent referral to cardiology for him to establish care and for surgical clearance, as we will likely move forward with bronchoscopy. He will also need PET scan, depending on CT imaging. Close follow up. Strict ED precautions should his symptoms worsen or he develop mass hemoptysis.   Patient Instructions  Stop Advair. Start Trelegy 1 puff daily. Brush tongue and rinse mouth afterwards  Continue Albuterol inhaler 2 puffs or duoneb 3 mL neb every 6 hours as needed for shortness of breath or wheezing. Notify if symptoms persist despite rescue inhaler/neb use. Complete levaquin as previously prescribed  STAT CT chest to evaluate lung mass  Labs today - BMET to evaluate kidney function, urinalysis   Urgent referral to cardiology to establish care and obtain pre-operative clearance as I suspect you will still need to undergo bronchoscopy.   Follow up in two weeks with [name] (1st) or Katie Dezerae Freiberger,NP to discuss next steps. If symptoms do not improve or worsen, please contact office for sooner follow up or seek emergency care.  COPD exacerbation Question AECOPD vs progression of disease/lung mass. He is currently on levaquin course, which upon review of his MAR, looks like was started on 9/3 for AECOPD. Advised that he complete this. At some point since he was here last, bevespi was d/c and he  was started on high dose Advair. He does feel like he gets good benefit from inhaler use. Recommended step up to triple therapy with Trelegy 200. See above plan.   Urinary incontinence Episode of nocturnal incontinence. He has also had urinary urgency/increased frequency. He's on levaquin, which would cover for UTI but we will check urinalysis/culture today.  I spent 45 minutes of dedicated to the care of this patient on the date of this encounter to include pre-visit review of records, face-to-face time with the patient discussing conditions above, post visit ordering of testing, clinical documentation with the electronic health record, making appropriate referrals as documented, and communicating necessary findings to members of the patients care team.  Clayton Bibles, NP 10/26/2021  Pt aware and understands NP's role.

## 2021-10-26 NOTE — Addendum Note (Signed)
Addended by: Clayton Bibles on: 10/26/2021 12:29 PM   Modules accepted: Orders

## 2021-10-26 NOTE — Patient Instructions (Addendum)
Stop Advair. Start Trelegy 1 puff daily. Brush tongue and rinse mouth afterwards  Continue Albuterol inhaler 2 puffs or duoneb 3 mL neb every 6 hours as needed for shortness of breath or wheezing. Notify if symptoms persist despite rescue inhaler/neb use. Complete levaquin as previously prescribed  STAT CT chest to evaluate lung mass  Labs today - BMET to evaluate kidney function, urinalysis   Urgent referral to cardiology to establish care and obtain pre-operative clearance as I suspect you will still need to undergo bronchoscopy.   Follow up in two weeks with [name] (1st) or Katie Algie Westry,NP to discuss next steps. If symptoms do not improve or worsen, please contact office for sooner follow up or seek emergency care.

## 2021-10-26 NOTE — Assessment & Plan Note (Signed)
He is a poor historian, significant psychiatric history. Story seems to be that he had stopped coughing up blood so he decided to postpone further intervention. He has developed worsening respiratory symptoms, recurrent intermittent hemoptysis, and weight loss over the past few months. We had a lengthy discussion about his lung mass and high suspicion that this is cancerous. We also discussed that his current respiratory symptoms are likely related to this. He was willing to undergo further evaluation/workup. STAT CT chest ordered for restaging. I sent urgent referral to cardiology for him to establish care and for surgical clearance, as we will likely move forward with bronchoscopy. He will also need PET scan, depending on CT imaging. Close follow up. Strict ED precautions should his symptoms worsen or he develop mass hemoptysis.   Patient Instructions  Stop Advair. Start Trelegy 1 puff daily. Brush tongue and rinse mouth afterwards  Continue Albuterol inhaler 2 puffs or duoneb 3 mL neb every 6 hours as needed for shortness of breath or wheezing. Notify if symptoms persist despite rescue inhaler/neb use. Complete levaquin as previously prescribed  STAT CT chest to evaluate lung mass  Labs today - BMET to evaluate kidney function, urinalysis   Urgent referral to cardiology to establish care and obtain pre-operative clearance as I suspect you will still need to undergo bronchoscopy.   Follow up in two weeks with [name] (1st) or Katie Dauntae Derusha,NP to discuss next steps. If symptoms do not improve or worsen, please contact office for sooner follow up or seek emergency care.

## 2021-10-26 NOTE — Assessment & Plan Note (Addendum)
Question AECOPD vs progression of disease/lung mass. He is currently on levaquin course, which upon review of his MAR, looks like was started on 9/3 for AECOPD. Advised that he complete this. At some point since he was here last, bevespi was d/c and he was started on high dose Advair. He does feel like he gets good benefit from inhaler use. Recommended step up to triple therapy with Trelegy 200. See above plan.

## 2021-10-27 ENCOUNTER — Other Ambulatory Visit: Payer: Self-pay | Admitting: Nurse Practitioner

## 2021-10-27 ENCOUNTER — Telehealth: Payer: Self-pay | Admitting: Nurse Practitioner

## 2021-10-27 DIAGNOSIS — R918 Other nonspecific abnormal finding of lung field: Secondary | ICD-10-CM

## 2021-10-27 NOTE — Telephone Encounter (Signed)
Attempted to contact patient; I was able to get in contact with his nurse, T.K., at [facility]. She requested results be faxed to the facility. I notified her that I have ordered PET scan and would like the patient to come back in ASAP to review results of CT and discuss next steps, including possible bronchoscopy. We were able to set him up for Monday 9/11.

## 2021-10-28 DIAGNOSIS — F419 Anxiety disorder, unspecified: Secondary | ICD-10-CM | POA: Diagnosis not present

## 2021-10-29 DIAGNOSIS — M6281 Muscle weakness (generalized): Secondary | ICD-10-CM | POA: Diagnosis not present

## 2021-10-29 DIAGNOSIS — R2689 Other abnormalities of gait and mobility: Secondary | ICD-10-CM | POA: Diagnosis not present

## 2021-10-29 DIAGNOSIS — J441 Chronic obstructive pulmonary disease with (acute) exacerbation: Secondary | ICD-10-CM | POA: Diagnosis not present

## 2021-10-29 DIAGNOSIS — I10 Essential (primary) hypertension: Secondary | ICD-10-CM | POA: Diagnosis not present

## 2021-10-29 DIAGNOSIS — R262 Difficulty in walking, not elsewhere classified: Secondary | ICD-10-CM | POA: Diagnosis not present

## 2021-10-29 NOTE — Telephone Encounter (Signed)
Pt's CT report has been printed to be faxed to provided fax number by [facility].

## 2021-11-01 ENCOUNTER — Ambulatory Visit (HOSPITAL_COMMUNITY)
Admission: RE | Admit: 2021-11-01 | Discharge: 2021-11-01 | Disposition: A | Discharge status: Home / Self Care | Payer: Medicare HMO | Source: Ambulatory Visit | Attending: Nurse Practitioner | Admitting: Nurse Practitioner

## 2021-11-01 ENCOUNTER — Ambulatory Visit (INDEPENDENT_AMBULATORY_CARE_PROVIDER_SITE_OTHER): Payer: Medicare HMO | Admitting: Nurse Practitioner

## 2021-11-01 ENCOUNTER — Encounter: Payer: Self-pay | Admitting: Nurse Practitioner

## 2021-11-01 VITALS — BP 116/72 | HR 95 | Ht 72.0 in

## 2021-11-01 DIAGNOSIS — R262 Difficulty in walking, not elsewhere classified: Secondary | ICD-10-CM | POA: Diagnosis not present

## 2021-11-01 DIAGNOSIS — J449 Chronic obstructive pulmonary disease, unspecified: Secondary | ICD-10-CM

## 2021-11-01 DIAGNOSIS — R2689 Other abnormalities of gait and mobility: Secondary | ICD-10-CM | POA: Diagnosis not present

## 2021-11-01 DIAGNOSIS — R918 Other nonspecific abnormal finding of lung field: Secondary | ICD-10-CM | POA: Diagnosis not present

## 2021-11-01 DIAGNOSIS — I25118 Atherosclerotic heart disease of native coronary artery with other forms of angina pectoris: Secondary | ICD-10-CM | POA: Diagnosis not present

## 2021-11-01 DIAGNOSIS — J441 Chronic obstructive pulmonary disease with (acute) exacerbation: Secondary | ICD-10-CM | POA: Diagnosis not present

## 2021-11-01 DIAGNOSIS — M6281 Muscle weakness (generalized): Secondary | ICD-10-CM | POA: Diagnosis not present

## 2021-11-01 LAB — GLUCOSE, CAPILLARY: Glucose-Capillary: 103 mg/dL — ABNORMAL HIGH (ref 70–99)

## 2021-11-01 MED ORDER — FLUDEOXYGLUCOSE F - 18 (FDG) INJECTION
11.3500 | Freq: Once | INTRAVENOUS | Status: AC
  Administered 2021-11-01: 11.3 via INTRAVENOUS

## 2021-11-01 NOTE — Assessment & Plan Note (Addendum)
Initially seen in January 2023 by [name] after being hospitalized for CAP. Follow up CT chest showed a 3.3x3.4 masslike consolidation involving the right hilar region. He was recommended to undergo bronchoscopy, which subsequently was canceled d/t requirement by anesthesia for cardiology clearance. He then decided that he would not like to proceed with further workup. In September 2023, contacted the office due to worsening respiratory symptoms, recurrent intermittent hemoptysis, and weight loss over the past few months. Seen on 9/5 with lengthy discussion about his lung mass and high suspicion that this is cancerous. We also discussed that his current respiratory symptoms are likely related to this. He was willing to undergo further evaluation/workup. STAT CT chest showed significant interval increase in the lung mass as well as compression of the right bronchus with related large infiltrate in the RLL. PET scan today, 11/01/2021, showed significant hypermetabolism to the mass as well as right paratracheal nodes and a L1 lesion with hypermetabolism. I sent an urgent referral to cardiology for him to establish care and for surgical clearance but he was never contacted for this. We were able to get him a new pt appt this Wednesday, 9/13. Set up for bronchoscopy/EBUS with [name] next week. Will need to hold Brilinta for 5 days prior. Referred to oncology. We also discussed referral to palliative care for pain management; will discuss with Dr. Delfina Redwood who is currently managing his narcotics.   Patient Instructions  Continue Trelegy 1 puff daily. Brush tongue and rinse mouth afterwards  Continue Albuterol inhaler 2 puffs or duoneb 3 mL neb every 6 hours as needed for shortness of breath or wheezing. Notify if symptoms persist despite rescue inhaler/neb use.   Urgent referral to cardiology to establish care and obtain pre-operative clearance prior to bronchoscopy - Scheduled Wednesday, 11/03/2021 at 1:30 pm with  Dr. Cecille Aver at 1:15 pm.  [place], [place]  Either I or one of the patient care coordinators will call you with date and time of your bronchoscopy  Message sent to Dr. Delfina Redwood to see about adjusting your pain medications   Follow up in two weeks after bronchoscopy with [name] or Alanson Aly. If symptoms do not improve or worsen, please contact office for sooner follow up or seek emergency care.

## 2021-11-01 NOTE — Assessment & Plan Note (Addendum)
CAD with hx of MI. Needs surgical clearance for bronchoscopy per anesthesia. Scheduled to see Dr. Audie Box 11/03/2021 for new pt appt/surgical clearance.

## 2021-11-01 NOTE — H&P (View-Only) (Signed)
$'@Patient'S$  ID: [name], male    DOB: 1955-11-24, 66 y.o.   MRN: [id number]  Chief Complaint  Patient presents with   Follow-up    Referring provider: [name], MD  HPI: 66 year old male, active smoker, nursing home resident followed for COPD and intermittent hemoptysis. He was seen for initial consult with [name] 03/17/2021 for evaluation of hemoptysis. Last seen in office 04/07/2021 by [name],NP. Past medical history significant for HTN, hx of MI, CAD, GERD, hypothyroid, DM II, HLD, arthritis, schizophrenia, depression, anxiety.   TEST/EVENTS:  04/05/2021 CT chest wo contrast: advanced 3 vessel CAD. There is a 3.3x3.4 cm rounded masslike consolidation involving the right hilar/infrahilar region, suspicious for malignancy. Partial consolidative changes in the RLL, which appear to represents postobstructive atelectasis or pneumonia. There are small scattered ground-glass densities in the RUL, could be atelectasis. 9 mm LLL nodule which is stable since 2016. 10/26/2021 CT chest w contrast: there is a large mass lesion extending from the right hilum into a subcarinal region of mediastinum measuring proximally 7.4x5.1 cm; significant interval increase. There is extrinsic compression of right main bronchus and its branches in the right hilum and mediastinum. Interval large infiltrate in RLL. New 1.4 cm pleural based density in the medial right apex. There is a new 8 mm pelural based density in the RUL. Small right pleural effusion. No evidence of ptx. 1.3 cm nodular density in the LUL with no significant change. There is a 9 mm nodule in the LLL without significant change. New small patchy infiltrates in the RUL, RML, and LUL.  11/01/2021 PET scan: the RLL masslike consolidation with centrally obstructing mass measuring 3.8x6.1cm SUV max 31. Mildly hypermetabolic right paratracheal lymph nodes with index mid right paratracheal lymph nodule measuring 9 mm. The LLL nodule is 8 mm and stable; below  PET threshold. Focal hypermetabolism in the soft tissues inferolateral to the left inferior pubic ramis without CT correlation. There is a L1 compression fx with hypermetabolism. 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 Hospitalized 2 months ago for 13 days and treated for LLL pna with abx. Intermittent hemoptysis since so he was treated again with levaquin course. Doing somewhat better since this with decreased cough. No hemoptysis x 1 week. CT chest on 04/05/2021 showed a 3.3x3.4 cm rounded masslike consolidation involving the right hilar region, suspicious for malignancy. There was occlusion of the bronchus intermedius with partial RLL postobstructive atelectasis/pna. Reviewed CT results; discussed next steps with bronchoscopy. Bronch scheduled for 2/24. PET scan ordered. Continued on Bevespi for COPD maintenance. Smoking cessation strongly advised.  Bronchoscopy ended up being canceled d/t requirement by anesthesia for cardiology clearance. Pt then declined further treatment upon follow up in March 2023.   10/26/2021: OV with [name] for acute visit. He has been struggling with his breathing for many years; feels as though it has been worse the past few months. He was unclear about the series of events but it sounds like he is being treated for AECOPD by the nursing home physician with levaquin. His cough has been unchanged; productive for many months with cream sputum. He will occasionally have pink tinged sputum, which had resolved when he was here in february 2023 but he reports that it started back a few weeks after. He notices an occasional wheeze, which he will  use his rescue for. Use anywhere between daily to a few times a week. He's unsure when but they changed him from [place] to Advair diskus. Does get good benefit  from inhaler use. He has been struggling with his appetite. He's worried his kidneys are failing because he had an incontinence episode last night while he was sleeping. No hematuria, dysuria. Does struggle with urgency/frequency. Denies fevers, chills, orthopnea, leg swelling. He has lost around 35 pounds since he was here last. He has not seen his cardiologist in a few years. He does wish to discuss next steps and figure out what he needs to do regarding the mass in his chest. STAT CT chest with contrast ordered for further evaluation. Urgent referral to cardiology for new patient/surgical clearance as he will likely need bronchoscopy.   11/01/2021: Today - follow up Patient presents today for follow up after CT chest and PET scan. Findings consistent with at least stage III, possibly IV, primary bronchogenic carcinoma. He reports feeling relatively unchanged today. He does feel like he gets a little more benefit from Trelegy vs Advair. He still has an occasional productive cough, which is usually cream but there are times when it is pink tinged. No increased shortness of breath or wheezing. He has not had any more incontinence episodes; UA was ordered at last visit but he was unable to void. He has severe lower back pain, which has been worse over the past month or two. He is on chronic narcotic therapy with oxycodone. Denies saddle anesthesia, lower extremity numbness/tingling. He does have baseline weakness and uses a wheelchair for mobility assistance. Denies any recent fevers, chills, mass hemoptysis, increased chest congestion or wheezing, lower extremity swelling, orthopnea. He does wish to proceed with further workup/treatment regarding these findings.   Allergies  Allergen Reactions   Rocephin [Ceftriaxone] Anaphylaxis   Zofran [Ondansetron Hcl] Swelling    sts had tongue swelling and trouble breathing   Ampicillin     Per MAR    Lipitor [Atorvastatin]     Per MAR    Prochlorperazine  Edisylate Other (See Comments)    Childhood Reaction.   Toradol [Ketorolac Tromethamine] Nausea And Vomiting    Pt states he is not allergic to ibuprofen   Tramadol Nausea And Vomiting    Not listed on MAR     Immunization History  Administered Date(s) Administered   Influenza Nasal 11/23/2015   Influenza, Seasonal, Injecte, Preservative Fre 11/22/2010, 12/12/2012, 02/15/2014   Influenza,inj,Quad PF,6+ Mos 11/26/2012, 01/17/2015, 11/06/2015   Influenza-Unspecified 02/26/2014, 01/05/2021   PPD Test 07/01/2016   Pneumococcal Polysaccharide-23 02/22/2008, 11/26/2012, 12/12/2012    Past Medical History:  Diagnosis Date   Anginal pain ([place])    Anxiety    Arthritis    "right hip; herniated L4-5" (11/05/2015)   Asthma    Bipolar 1 disorder (HCC)    CHF (congestive heart failure) (HCC)    Chronic bronchitis (HCC)    Chronic lower back pain    Chronic pain disorder    Community acquired pneumonia 10/30/2016   COPD (chronic obstructive pulmonary disease) ([place])    Coronary artery disease    Coronary atherosclerosis 12/05/2008   Qualifier: Diagnosis of  By: Jaramillo, [place], HX OF 12/05/2008   Qualifier: Diagnosis of  By: Jaramillo, [place], MAJOR 12/05/2008   Qualifier: Diagnosis of  By: Sidney Ace     Diabetes mellitus type 2 with complications ([place]) 05/19/9240   Diverticulitis  large intestine    DVT (deep venous thrombosis) ([place]) 1991   "left calf; after 1st knee scope"   EROSIVE GASTRITIS 12/05/2008   Qualifier: Diagnosis of  By: Sidney Ace     Fungal esophagitis    GERD 12/05/2008   Qualifier: Diagnosis of  By: Sidney Ace     GERD (gastroesophageal reflux disease)    Heart attack ([place]) 2010   BMS x 2 RCA   High cholesterol    Hyperlipidemia    Hypertension    Hypothyroidism    MRSA (methicillin resistant staph aureus) culture positive    Myocardial infarction [facility])    MYOCARDIAL INFARCTION, HX OF 12/05/2008    Qualifier: Diagnosis of  By: Sidney Ace     Obesity 12/05/2008   Qualifier: Diagnosis of  By: Sidney Ace     Pneumonia    "several times"   PULMONARY NODULE, LEFT LOWER LOBE 12/05/2008   Qualifier: Diagnosis of  By: Sidney Ace     S/P angioplasty with stent 11/05/15 PCI DES to mLAD  11/06/2015   Schizo affective schizophrenia ([place]) 2002   SOB (shortness of breath) 08/22/2016   Status post cholecystectomy 09/25/2014    Tobacco History: Social History   Tobacco Use  Smoking Status Some Days   Years: 42.00   Types: Cigarettes  Smokeless Tobacco Never  Tobacco Comments   2/3 cigs per day    Ready to quit: Not Answered Counseling given: Not Answered Tobacco comments: 2/3 cigs per day    Outpatient Medications Prior to Visit  Medication Sig Dispense Refill   acetaminophen (TYLENOL) 325 MG tablet Take 650 mg by mouth every 6 (six) hours as needed for moderate pain.     albuterol (PROVENTIL) (2.5 MG/3ML) 0.083% nebulizer solution Take 2.5 mg by nebulization every 6 (six) hours as needed for wheezing or shortness of breath.     albuterol (VENTOLIN HFA) 108 (90 Base) MCG/ACT inhaler Inhale 2 puffs into the lungs every 6 (six) hours as needed for wheezing or shortness of breath.     allopurinol (ZYLOPRIM) 100 MG tablet Take 100 mg by mouth daily.     alprazolam (XANAX) 2 MG tablet Take 2 mg by mouth once as needed (for acute anxiety and stress for 1 day monitor sedation).     cyclobenzaprine (FLEXERIL) 10 MG tablet Take 10 mg by mouth 3 (three) times daily.     Fluticasone-Umeclidin-Vilant (TRELEGY ELLIPTA) 200-62.5-25 MCG/ACT AEPB Inhale 1 puff into the lungs daily. 14 each 0   furosemide (LASIX) 20 MG tablet Take 20 mg by mouth daily.     gabapentin (NEURONTIN) 400 MG capsule Take 400 mg by mouth 3 (three) times daily.     Glucagon, rDNA, (GLUCAGON EMERGENCY) 1 MG KIT Inject 1 mg as directed as needed (hypoglycemia).     HUMALOG KWIKPEN 100 UNIT/ML KwikPen Inject 2-10 Units into  the skin in the morning, at noon, in the evening, and at bedtime. Per Sliding Scale     IPRATROPIUM-ALBUTEROL IN Inhale into the lungs.     iron polysaccharides (NIFEREX) 150 MG capsule Take 150 mg by mouth daily. Nu-Iron     levothyroxine (SYNTHROID) 75 MCG tablet Take 75 mcg by mouth daily before breakfast.     LORazepam (ATIVAN) 2 MG tablet Take 2 mg by mouth every 12 (twelve) hours.     Melatonin 5 MG CAPS Take by mouth.     metFORMIN (GLUCOPHAGE) 500 MG tablet Take 500 mg by mouth 2 (two) times daily with  a meal.     metoprolol succinate (TOPROL-XL) 25 MG 24 hr tablet Take 25 mg by mouth daily.     omeprazole (PRILOSEC) 20 MG capsule Take 20 mg by mouth every morning.     Oxycodone HCl 10 MG TABS Take 10 mg by mouth every 4 (four) hours as needed (pain).     polyethylene glycol (MIRALAX / GLYCOLAX) packet Take 17 g by mouth daily as needed for mild constipation.      pravastatin (PRAVACHOL) 80 MG tablet Take 80 mg by mouth at bedtime.     QUEtiapine (SEROQUEL) 100 MG tablet Take 100 mg by mouth at bedtime.     temazepam (RESTORIL) 30 MG capsule Take 30 mg by mouth at bedtime.     therapeutic multivitamin-minerals (THERAGRAN-M) tablet Take 1 tablet by mouth daily.     ticagrelor (BRILINTA) 90 MG TABS tablet Take 90 mg by mouth 2 (two) times daily.     venlafaxine (EFFEXOR) 75 MG tablet Take 75 mg by mouth daily.      No facility-administered medications prior to visit.     Review of Systems:   Constitutional: No fevers, chills, night sweats. +weight loss, fatigue, lassitude. HEENT: No headaches, difficulty swallowing, tooth/dental problems, or sore throat. No sneezing, itching, ear ache, nasal congestion, or post nasal drip CV:  No chest pain, orthopnea, PND, swelling in lower extremities, anasarca, dizziness, palpitations, syncope Resp: +shortness of breath with exertion; productive cough; wheezing; intermittent hemoptysis. No chest wall deformity GI:  +decreased appetite. No  heartburn, indigestion, abdominal pain, nausea, vomiting, diarrhea, change in bowel habits, bloody stools.  GU: No dysuria, change in color of urine, urgency, frequency.  No flank pain, no hematuria  MSK: +chronic back pain, increased over the past 1-2 months. No joint pain or swelling. Neuro: No dizziness or lightheadedness.  Psych: No increased depression or anxiety. Mood stable.     Physical Exam:  BP 116/72 (BP Location: Left Arm, Cuff Size: Normal)   Pulse 95   Ht 6' (1.829 m)   SpO2 96%   BMI 30.79 kg/m   GEN: Pleasant, interactive, chronically-ill appearing; in no acute distress. HEENT:  Normocephalic and atraumatic. PERRLA. Sclera white. Nasal turbinates pink, moist and patent bilaterally. No rhinorrhea present. Oropharynx pink and moist, without exudate or edema. No lesions, ulcerations, or postnasal drip.  NECK:  Supple w/ fair ROM. No lymphadenopathy.   CV: RRR, no m/r/g, no peripheral edema. Pulses intact, +2 bilaterally. No cyanosis, pallor or clubbing. PULMONARY:  Unlabored, regular breathing. Diminished right posteriorly otherwise clear A&P w/o wheezes/rales/rhonchi. No accessory muscle use. No dullness to percussion. GI: BS present and normoactive. Soft, non-tender to palpation. No CVA tenderness. MSK: No erythema, warmth or tenderness. Muscle wasting Neuro: A/Ox3. No focal deficits noted.   Skin: Warm, no lesions or rashe Psych: Normal affect and behavior. Judgement and thought content appropriate.     Lab Results:  CBC    Component Value Date/Time   WBC 10.3 06/18/2021 1801   RBC 3.39 (L) 06/18/2021 1801   HGB 9.6 (L) 06/18/2021 1801   HCT 28.8 (L) 06/18/2021 1801   PLT 251 06/18/2021 1801   MCV 85.0 06/18/2021 1801   MCH 28.3 06/18/2021 1801   MCHC 33.3 06/18/2021 1801   RDW 15.4 06/18/2021 1801   LYMPHSABS 3.0 06/18/2021 1801   MONOABS 0.7 06/18/2021 1801   EOSABS 0.2 06/18/2021 1801   BASOSABS 0.0 06/18/2021 1801    BMET    Component Value  Date/Time  NA 132 (L) 10/26/2021 1107   NA 138 01/21/2017 0000   K 3.6 10/26/2021 1107   CL 94 (L) 10/26/2021 1107   CO2 24 10/26/2021 1107   GLUCOSE 121 (H) 10/26/2021 1107   BUN 14 10/26/2021 1107   BUN 16 01/21/2017 0000   CREATININE 0.88 10/26/2021 1107   CALCIUM 9.1 10/26/2021 1107   GFRNONAA >60 06/18/2021 1801   GFRAA >60 12/11/2016 1443    BNP    Component Value Date/Time   BNP 7.3 11/09/2015 0523     Imaging:  NM PET Image Initial (PI) Skull Base To Thigh (F-18 FDG)  Result Date: 11/01/2021 CLINICAL DATA:  Initial treatment strategy for lung nodule. EXAM: NUCLEAR MEDICINE PET SKULL BASE TO THIGH TECHNIQUE: 11.3 mCi F-18 FDG was injected intravenously. Full-ring PET imaging was performed from the skull base to thigh after the radiotracer. CT data was obtained and used for attenuation correction and anatomic localization. Fasting blood glucose: 103 mg/dl COMPARISON:  CT chest 10/26/2021, 04/05/2021, 04/13/2020. FINDINGS: Mediastinal blood pool activity: SUV max 3.0 Liver activity: SUV max NA NECK: No abnormal hypermetabolism. Incidental CT findings: None. CHEST: Hypermetabolic masslike consolidation in the right lower lobe with a centrally obstructing mass measuring approximately 3.8 x 6.1 cm (4/93), SUV max 31.0. Mildly hypermetabolic right paratracheal lymph nodes with index mid right paratracheal lymph node measuring 9 mm (4/72), SUV max 3.9. Incidental CT findings: Atherosclerotic calcification of the aorta and coronary arteries. Heart is at the upper limits of normal in size. No pericardial effusion. Small right pleural effusion. Central right lower lobe mass, as described above, severely narrows the right upper lobe bronchus and completely obstructs the bronchus intermedius. Associated collapse/consolidation in the right middle and right lower lobes. Slight septal thickening and nodularity in the aerated right upper lobe. 8 mm anterior left lower lobe nodule (7/42), stable  from 04/05/2021 and too small for PET resolution. ABDOMEN/PELVIS: No abnormal hypermetabolism in the liver, adrenal glands, spleen or pancreas. No hypermetabolic lymph nodes. Incidental CT findings: Liver is unremarkable. Cholecystectomy. Adrenal glands, kidneys, spleen, pancreas, stomach and bowel are grossly unremarkable. Atherosclerotic calcification of the aorta. SKELETON: Focal hypermetabolism in the soft tissues just inferolateral to the left inferior pubic ramus does not have a CT correlate. L1 compression deformity with associated hypermetabolism. No additional abnormal hypermetabolism. Incidental CT findings: Degenerative changes in the spine. IMPRESSION: 1. Centrally obstructing right lower lobe mass with right paratracheal adenopathy, compatible with at least T3 N1 M0 or stage IIIA primary bronchogenic carcinoma. 2. Age-indeterminate L1 compression fracture. If pathologic in nature, findings would then be consistent with stage IV disease. 3. Slight septal thickening and nodularity in the right upper lobe. Difficult to exclude lymphangitic carcinomatosis. 4. 8 mm left lower lobe nodule, stable from 04/05/2021 and too small for PET resolution. Recommend attention on follow-up. 5. Postobstructive collapse/consolidation in the right middle and right lower lobes. 6. Small right pleural effusion. 7. Aortic atherosclerosis (ICD10-I70.0). Coronary artery calcification. Electronically Signed   By: Lorin Picket M.D.   On: 11/01/2021 12:04   CT Chest W Contrast  Result Date: 10/26/2021 CLINICAL DATA:  Hemoptysis, right lung mass, shortness of breath EXAM: CT CHEST WITH CONTRAST TECHNIQUE: Multidetector CT imaging of the chest was performed during intravenous contrast administration. RADIATION DOSE REDUCTION: This exam was performed according to the departmental dose-optimization program which includes automated exposure control, adjustment of the mA and/or kV according to patient size and/or use of iterative  reconstruction technique. CONTRAST:  52mL OMNIPAQUE IOHEXOL 300 MG/ML  SOLN COMPARISON:  None Available. FINDINGS: Cardiovascular: Coronary artery calcifications are seen. There is homogeneous enhancement in thoracic aorta. There are no intraluminal filling defects in central pulmonary artery branches. Evaluation of small peripheral branches in right lung is less than optimal due to extensive infiltrates in right lower lobe. There is extrinsic compression of right lower lobe pulmonary artery in the right hilum. Mediastinum/Nodes: There is a large mass lesion extending from the right hilum into subcarinal region of mediastinum measuring a proximally 7.4 x 5.1 cm. There is significant interval increase in size of the right hilar mass. There is extrinsic compression of right main bronchus and its branches in the right hilum and mediastinum. Lungs/Pleura: There is significant interval increase in size of right hilar mass measuring up to 7.4 cm. The mass is extending into the subcarinal region of the mediastinum. There is interval appearance of large infiltrate in right lower lobe suggesting atelectasis/pneumonia. In image 30 of series 5, there is a new 1.4 cm pleural-based density in the medial right apex. In image 51 of series 5, there is new 8 mm pleural-based density in right upper lobe. Small right pleural effusion is seen. There is no pneumothorax in image 74 of series 5, there is 1.3 cm nodular density in left upper lobe with no significant change. In image 102 of series 5, there is a 9 mm nodule in left lower lobe with no significant change. New small patchy infiltrates are seen in right upper lobe, right middle lobe and left upper lobe. There is no pneumothorax. Upper Abdomen: No acute findings are seen. Musculoskeletal: No acute findings are seen. IMPRESSION: There is a large central mass lesion in right hilum measuring up to 7.4 cm in size extending into the subcarinal region of mediastinum. The significant  interval increase in size suggesting progression of malignant neoplasm. There is large infiltrate in right lower lobe which may suggest pneumonia/atelectasis due to central obstructing lesion. There are new pleural-based nodular densities in right upper lobe. There are few noncalcified nodules in the left upper lobe and left lower lobe. Follow-up PET-CT may be considered to evaluate for metastatic disease. Small right pleural effusion. Extensive coronary artery calcifications are seen. There is no evidence of thoracic aortic dissection. There is no central pulmonary artery embolism. Electronically Signed   By: Elmer Picker M.D.   On: 10/26/2021 16:09          No data to display          No results found for: "NITRICOXIDE"      Assessment & Plan:   Lung mass Initially seen in January 2023 by [name] after being hospitalized for CAP. Follow up CT chest showed a 3.3x3.4 masslike consolidation involving the right hilar region. He was recommended to undergo bronchoscopy, which subsequently was canceled d/t requirement by anesthesia for cardiology clearance. He then decided that he would not like to proceed with further workup. In September 2023, contacted the office due to worsening respiratory symptoms, recurrent intermittent hemoptysis, and weight loss over the past few months. Seen on 9/5 with lengthy discussion about his lung mass and high suspicion that this is cancerous. We also discussed that his current respiratory symptoms are likely related to this. He was willing to undergo further evaluation/workup. STAT CT chest showed significant interval increase in the lung mass as well as compression of the right bronchus with related large infiltrate in the RLL. PET scan today, 11/01/2021, showed significant hypermetabolism to the mass as well as right  paratracheal nodes and a L1 lesion with hypermetabolism. I sent an urgent referral to cardiology for him to establish care and for surgical  clearance but he was never contacted for this. We were able to get him a new pt appt this Wednesday, 9/13. Set up for bronchoscopy/EBUS with [name] next week. Will need to hold Brilinta for 5 days prior. Referred to oncology. We also discussed referral to palliative care for pain management; will discuss with Dr. Delfina Redwood who is currently managing his narcotics.   Patient Instructions  Continue Trelegy 1 puff daily. Brush tongue and rinse mouth afterwards  Continue Albuterol inhaler 2 puffs or duoneb 3 mL neb every 6 hours as needed for shortness of breath or wheezing. Notify if symptoms persist despite rescue inhaler/neb use.   Urgent referral to cardiology to establish care and obtain pre-operative clearance prior to bronchoscopy - Scheduled Wednesday, 11/03/2021 at 1:30 pm with Dr. Cecille Aver at 1:15 pm.  [place], [place]  Either I or one of the patient care coordinators will call you with date and time of your bronchoscopy  Message sent to Dr. Delfina Redwood to see about adjusting your pain medications   Follow up in two weeks after bronchoscopy with [name] or Alanson Aly. If symptoms do not improve or worsen, please contact office for sooner follow up or seek emergency care.                COPD (chronic obstructive pulmonary disease) ([place]) Suspect that his progressive dyspnea is related to increased lung mass/RLL compression. He did receive some benefit from change to Trelegy. No changes to regimen today.   Coronary artery disease CAD with hx of MI. Needs surgical clearance for bronchoscopy per anesthesia. Scheduled to see Dr. Audie Box 11/03/2021 for new pt appt/surgical clearance.   I spent 42 minutes of dedicated to the care of this patient on the date of this encounter to include pre-visit review of records, face-to-face time with the patient discussing conditions above, post visit ordering of testing, clinical documentation with the electronic health record, making  appropriate referrals as documented, and communicating necessary findings to members of the patients care team.  Clayton Bibles, NP 11/01/2021  Pt aware and understands NP's role.

## 2021-11-01 NOTE — Assessment & Plan Note (Signed)
Suspect that his progressive dyspnea is related to increased lung mass/RLL compression. He did receive some benefit from change to Trelegy. No changes to regimen today.

## 2021-11-01 NOTE — Patient Instructions (Addendum)
Continue Trelegy 1 puff daily. Brush tongue and rinse mouth afterwards  Continue Albuterol inhaler 2 puffs or duoneb 3 mL neb every 6 hours as needed for shortness of breath or wheezing. Notify if symptoms persist despite rescue inhaler/neb use.   Urgent referral to cardiology to establish care and obtain pre-operative clearance prior to bronchoscopy - Scheduled Wednesday, 11/03/2021 at 1:30 pm with Dr. Cecille Aver at 1:15 pm.  [place], [place]  Either I or one of the patient care coordinators will call you with date and time of your bronchoscopy  Message sent to Dr. Delfina Redwood to see about adjusting your pain medications   Follow up in two weeks after bronchoscopy with [name] or Alanson Aly. If symptoms do not improve or worsen, please contact office for sooner follow up or seek emergency care.

## 2021-11-01 NOTE — Progress Notes (Signed)
$'@Patient'j$  ID: [name], male    DOB: Jul 19, 1955, 66 y.o.   MRN: [id number]  Chief Complaint  Patient presents with   Follow-up    Referring provider: [name], MD  HPI: 66 year old male, active smoker, nursing home resident followed for COPD and intermittent hemoptysis. He was seen for initial consult with [name] 03/17/2021 for evaluation of hemoptysis. Last seen in office 04/07/2021 by [name],Ray. Past medical history significant for HTN, hx of MI, CAD, GERD, hypothyroid, DM II, HLD, arthritis, schizophrenia, depression, anxiety.   TEST/EVENTS:  04/05/2021 CT chest wo contrast: advanced 3 vessel CAD. There is a 3.3x3.4 cm rounded masslike consolidation involving the right hilar/infrahilar region, suspicious for malignancy. Partial consolidative changes in the RLL, which appear to represents postobstructive atelectasis or pneumonia. There are small scattered ground-glass densities in the RUL, could be atelectasis. 9 mm LLL nodule which is stable since 2016. 10/26/2021 CT chest w contrast: there is a large mass lesion extending from the right hilum into a subcarinal region of mediastinum measuring proximally 7.4x5.1 cm; significant interval increase. There is extrinsic compression of right main bronchus and its branches in the right hilum and mediastinum. Interval large infiltrate in RLL. New 1.4 cm pleural based density in the medial right apex. There is a new 8 mm pelural based density in the RUL. Small right pleural effusion. No evidence of ptx. 1.3 cm nodular density in the LUL with no significant change. There is a 9 mm nodule in the LLL without significant change. New small patchy infiltrates in the RUL, RML, and LUL.  11/01/2021 PET scan: the RLL masslike consolidation with centrally obstructing mass measuring 3.8x6.1cm SUV max 31. Mildly hypermetabolic right paratracheal lymph nodes with index mid right paratracheal lymph nodule measuring 9 mm. The LLL nodule is 8 mm and stable; below  PET threshold. Focal hypermetabolism in the soft tissues inferolateral to the left inferior pubic ramis without CT correlation. There is a L1 compression fx with hypermetabolism. 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 Hospitalized 2 months ago for 13 days and treated for LLL pna with abx. Intermittent hemoptysis since so he was treated again with levaquin course. Doing somewhat better since this with decreased cough. No hemoptysis x 1 week. CT chest on 04/05/2021 showed a 3.3x3.4 cm rounded masslike consolidation involving the right hilar region, suspicious for malignancy. There was occlusion of the bronchus intermedius with partial RLL postobstructive atelectasis/pna. Reviewed CT results; discussed next steps with bronchoscopy. Bronch scheduled for 2/24. PET scan ordered. Continued on Bevespi for COPD maintenance. Smoking cessation strongly advised.  Bronchoscopy ended up being canceled d/t requirement by anesthesia for cardiology clearance. Pt then declined further treatment upon follow up in March 2023.   10/26/2021: OV with [name] for acute visit. He has been struggling with his breathing for many years; feels as though it has been worse the past few months. He was unclear about the series of events but it sounds like he is being treated for AECOPD by the nursing home physician with levaquin. His cough has been unchanged; productive for many months with cream sputum. He will occasionally have pink tinged sputum, which had resolved when he was here in february 2023 but he reports that it started back a few weeks after. He notices an occasional wheeze, which he will  use his rescue for. Use anywhere between daily to a few times a week. He's unsure when but they changed him from [place] to Advair diskus. Does get good benefit  from inhaler use. He has been struggling with his appetite. He's worried his kidneys are failing because he had an incontinence episode last night while he was sleeping. No hematuria, dysuria. Does struggle with urgency/frequency. Denies fevers, chills, orthopnea, leg swelling. He has lost around 35 pounds since he was here last. He has not seen his cardiologist in a few years. He does wish to discuss next steps and figure out what he needs to do regarding the mass in his chest. STAT CT chest with contrast ordered for further evaluation. Urgent referral to cardiology for new patient/surgical clearance as he will likely need bronchoscopy.   11/01/2021: Today - follow up Patient presents today for follow up after CT chest and PET scan. Findings consistent with at least stage III, possibly IV, primary bronchogenic carcinoma. He reports feeling relatively unchanged today. He does feel like he gets a little more benefit from Trelegy vs Advair. He still has an occasional productive cough, which is usually cream but there are times when it is pink tinged. No increased shortness of breath or wheezing. He has not had any more incontinence episodes; UA was ordered at last visit but he was unable to void. He has severe lower back pain, which has been worse over the past month or two. He is on chronic narcotic therapy with oxycodone. Denies saddle anesthesia, lower extremity numbness/tingling. He does have baseline weakness and uses a wheelchair for mobility assistance. Denies any recent fevers, chills, mass hemoptysis, increased chest congestion or wheezing, lower extremity swelling, orthopnea. He does wish to proceed with further workup/treatment regarding these findings.   Allergies  Allergen Reactions   Rocephin [Ceftriaxone] Anaphylaxis   Zofran [Ondansetron Hcl] Swelling    sts had tongue swelling and trouble breathing   Ampicillin     Per MAR    Lipitor [Atorvastatin]     Per MAR    Prochlorperazine  Edisylate Other (See Comments)    Childhood Reaction.   Toradol [Ketorolac Tromethamine] Nausea And Vomiting    Pt states he is not allergic to ibuprofen   Tramadol Nausea And Vomiting    Not listed on MAR     Immunization History  Administered Date(s) Administered   Influenza Nasal 11/23/2015   Influenza, Seasonal, Injecte, Preservative Fre 11/22/2010, 12/12/2012, 02/15/2014   Influenza,inj,Quad PF,6+ Mos 11/26/2012, 01/17/2015, 11/06/2015   Influenza-Unspecified 02/26/2014, 01/05/2021   PPD Test 07/01/2016   Pneumococcal Polysaccharide-23 02/22/2008, 11/26/2012, 12/12/2012    Past Medical History:  Diagnosis Date   Anginal pain ([place])    Anxiety    Arthritis    "right hip; herniated L4-5" (11/05/2015)   Asthma    Bipolar 1 disorder (HCC)    CHF (congestive heart failure) (HCC)    Chronic bronchitis (HCC)    Chronic lower back pain    Chronic pain disorder    Community acquired pneumonia 10/30/2016   COPD (chronic obstructive pulmonary disease) ([place])    Coronary artery disease    Coronary atherosclerosis 12/05/2008   Qualifier: Diagnosis of  By: Jaramillo, Arnold, HX OF 12/05/2008   Qualifier: Diagnosis of  By: Jaramillo, Worden, MAJOR 12/05/2008   Qualifier: Diagnosis of  By: Sidney Ace     Diabetes mellitus type 2 with complications ([place]) 9/77/4142   Diverticulitis  large intestine    DVT (deep venous thrombosis) ([place]) 1991   "left calf; after 1st knee scope"   EROSIVE GASTRITIS 12/05/2008   Qualifier: Diagnosis of  By: Sidney Ace     Fungal esophagitis    GERD 12/05/2008   Qualifier: Diagnosis of  By: Sidney Ace     GERD (gastroesophageal reflux disease)    Heart attack ([place]) 2010   BMS x 2 RCA   High cholesterol    Hyperlipidemia    Hypertension    Hypothyroidism    MRSA (methicillin resistant staph aureus) culture positive    Myocardial infarction [facility])    MYOCARDIAL INFARCTION, HX OF 12/05/2008    Qualifier: Diagnosis of  By: Sidney Ace     Obesity 12/05/2008   Qualifier: Diagnosis of  By: Sidney Ace     Pneumonia    "several times"   PULMONARY NODULE, LEFT LOWER LOBE 12/05/2008   Qualifier: Diagnosis of  By: Sidney Ace     S/P angioplasty with stent 11/05/15 PCI DES to mLAD  11/06/2015   Schizo affective schizophrenia ([place]) 2002   SOB (shortness of breath) 08/22/2016   Status post cholecystectomy 09/25/2014    Tobacco History: Social History   Tobacco Use  Smoking Status Some Days   Years: 42.00   Types: Cigarettes  Smokeless Tobacco Never  Tobacco Comments   2/3 cigs per day    Ready to quit: Not Answered Counseling given: Not Answered Tobacco comments: 2/3 cigs per day    Outpatient Medications Prior to Visit  Medication Sig Dispense Refill   acetaminophen (TYLENOL) 325 MG tablet Take 650 mg by mouth every 6 (six) hours as needed for moderate pain.     albuterol (PROVENTIL) (2.5 MG/3ML) 0.083% nebulizer solution Take 2.5 mg by nebulization every 6 (six) hours as needed for wheezing or shortness of breath.     albuterol (VENTOLIN HFA) 108 (90 Base) MCG/ACT inhaler Inhale 2 puffs into the lungs every 6 (six) hours as needed for wheezing or shortness of breath.     allopurinol (ZYLOPRIM) 100 MG tablet Take 100 mg by mouth daily.     alprazolam (XANAX) 2 MG tablet Take 2 mg by mouth once as needed (for acute anxiety and stress for 1 day monitor sedation).     cyclobenzaprine (FLEXERIL) 10 MG tablet Take 10 mg by mouth 3 (three) times daily.     Fluticasone-Umeclidin-Vilant (TRELEGY ELLIPTA) 200-62.5-25 MCG/ACT AEPB Inhale 1 puff into the lungs daily. 14 each 0   furosemide (LASIX) 20 MG tablet Take 20 mg by mouth daily.     gabapentin (NEURONTIN) 400 MG capsule Take 400 mg by mouth 3 (three) times daily.     Glucagon, rDNA, (GLUCAGON EMERGENCY) 1 MG KIT Inject 1 mg as directed as needed (hypoglycemia).     HUMALOG KWIKPEN 100 UNIT/ML KwikPen Inject 2-10 Units into  the skin in the morning, at noon, in the evening, and at bedtime. Per Sliding Scale     IPRATROPIUM-ALBUTEROL IN Inhale into the lungs.     iron polysaccharides (NIFEREX) 150 MG capsule Take 150 mg by mouth daily. Nu-Iron     levothyroxine (SYNTHROID) 75 MCG tablet Take 75 mcg by mouth daily before breakfast.     LORazepam (ATIVAN) 2 MG tablet Take 2 mg by mouth every 12 (twelve) hours.     Melatonin 5 MG CAPS Take by mouth.     metFORMIN (GLUCOPHAGE) 500 MG tablet Take 500 mg by mouth 2 (two) times daily with  a meal.     metoprolol succinate (TOPROL-XL) 25 MG 24 hr tablet Take 25 mg by mouth daily.     omeprazole (PRILOSEC) 20 MG capsule Take 20 mg by mouth every morning.     Oxycodone HCl 10 MG TABS Take 10 mg by mouth every 4 (four) hours as needed (pain).     polyethylene glycol (MIRALAX / GLYCOLAX) packet Take 17 g by mouth daily as needed for mild constipation.      pravastatin (PRAVACHOL) 80 MG tablet Take 80 mg by mouth at bedtime.     QUEtiapine (SEROQUEL) 100 MG tablet Take 100 mg by mouth at bedtime.     temazepam (RESTORIL) 30 MG capsule Take 30 mg by mouth at bedtime.     therapeutic multivitamin-minerals (THERAGRAN-M) tablet Take 1 tablet by mouth daily.     ticagrelor (BRILINTA) 90 MG TABS tablet Take 90 mg by mouth 2 (two) times daily.     venlafaxine (EFFEXOR) 75 MG tablet Take 75 mg by mouth daily.      No facility-administered medications prior to visit.     Review of Systems:   Constitutional: No fevers, chills, night sweats. +weight loss, fatigue, lassitude. HEENT: No headaches, difficulty swallowing, tooth/dental problems, or sore throat. No sneezing, itching, ear ache, nasal congestion, or post nasal drip CV:  No chest pain, orthopnea, PND, swelling in lower extremities, anasarca, dizziness, palpitations, syncope Resp: +shortness of breath with exertion; productive cough; wheezing; intermittent hemoptysis. No chest wall deformity GI:  +decreased appetite. No  heartburn, indigestion, abdominal pain, nausea, vomiting, diarrhea, change in bowel habits, bloody stools.  GU: No dysuria, change in color of urine, urgency, frequency.  No flank pain, no hematuria  MSK: +chronic back pain, increased over the past 1-2 months. No joint pain or swelling. Neuro: No dizziness or lightheadedness.  Psych: No increased depression or anxiety. Mood stable.     Physical Exam:  BP 116/72 (BP Location: Left Arm, Cuff Size: Normal)   Pulse 95   Ht 6' (1.829 m)   SpO2 96%   BMI 30.79 kg/m   GEN: Pleasant, interactive, chronically-ill appearing; in no acute distress. HEENT:  Normocephalic and atraumatic. PERRLA. Sclera white. Nasal turbinates pink, moist and patent bilaterally. No rhinorrhea present. Oropharynx pink and moist, without exudate or edema. No lesions, ulcerations, or postnasal drip.  NECK:  Supple w/ fair ROM. No lymphadenopathy.   CV: RRR, no m/r/g, no peripheral edema. Pulses intact, +2 bilaterally. No cyanosis, pallor or clubbing. PULMONARY:  Unlabored, regular breathing. Diminished right posteriorly otherwise clear A&P w/o wheezes/rales/rhonchi. No accessory muscle use. No dullness to percussion. GI: BS present and normoactive. Soft, non-tender to palpation. No CVA tenderness. MSK: No erythema, warmth or tenderness. Muscle wasting Neuro: A/Ox3. No focal deficits noted.   Skin: Warm, no lesions or rashe Psych: Normal affect and behavior. Judgement and thought content appropriate.     Lab Results:  CBC    Component Value Date/Time   WBC 10.3 06/18/2021 1801   RBC 3.39 (L) 06/18/2021 1801   HGB 9.6 (L) 06/18/2021 1801   HCT 28.8 (L) 06/18/2021 1801   PLT 251 06/18/2021 1801   MCV 85.0 06/18/2021 1801   MCH 28.3 06/18/2021 1801   MCHC 33.3 06/18/2021 1801   RDW 15.4 06/18/2021 1801   LYMPHSABS 3.0 06/18/2021 1801   MONOABS 0.7 06/18/2021 1801   EOSABS 0.2 06/18/2021 1801   BASOSABS 0.0 06/18/2021 1801    BMET    Component Value  Date/Time  NA 132 (L) 10/26/2021 1107   NA 138 01/21/2017 0000   K 3.6 10/26/2021 1107   CL 94 (L) 10/26/2021 1107   CO2 24 10/26/2021 1107   GLUCOSE 121 (H) 10/26/2021 1107   BUN 14 10/26/2021 1107   BUN 16 01/21/2017 0000   CREATININE 0.88 10/26/2021 1107   CALCIUM 9.1 10/26/2021 1107   GFRNONAA >60 06/18/2021 1801   GFRAA >60 12/11/2016 1443    BNP    Component Value Date/Time   BNP 7.3 11/09/2015 0523     Imaging:  NM PET Image Initial (PI) Skull Base To Thigh (F-18 FDG)  Result Date: 11/01/2021 CLINICAL DATA:  Initial treatment strategy for lung nodule. EXAM: NUCLEAR MEDICINE PET SKULL BASE TO THIGH TECHNIQUE: 11.3 mCi F-18 FDG was injected intravenously. Full-ring PET imaging was performed from the skull base to thigh after the radiotracer. CT data was obtained and used for attenuation correction and anatomic localization. Fasting blood glucose: 103 mg/dl COMPARISON:  CT chest 10/26/2021, 04/05/2021, 04/13/2020. FINDINGS: Mediastinal blood pool activity: SUV max 3.0 Liver activity: SUV max NA NECK: No abnormal hypermetabolism. Incidental CT findings: None. CHEST: Hypermetabolic masslike consolidation in the right lower lobe with a centrally obstructing mass measuring approximately 3.8 x 6.1 cm (4/93), SUV max 31.0. Mildly hypermetabolic right paratracheal lymph nodes with index mid right paratracheal lymph node measuring 9 mm (4/72), SUV max 3.9. Incidental CT findings: Atherosclerotic calcification of the aorta and coronary arteries. Heart is at the upper limits of normal in size. No pericardial effusion. Small right pleural effusion. Central right lower lobe mass, as described above, severely narrows the right upper lobe bronchus and completely obstructs the bronchus intermedius. Associated collapse/consolidation in the right middle and right lower lobes. Slight septal thickening and nodularity in the aerated right upper lobe. 8 mm anterior left lower lobe nodule (7/42), stable  from 04/05/2021 and too small for PET resolution. ABDOMEN/PELVIS: No abnormal hypermetabolism in the liver, adrenal glands, spleen or pancreas. No hypermetabolic lymph nodes. Incidental CT findings: Liver is unremarkable. Cholecystectomy. Adrenal glands, kidneys, spleen, pancreas, stomach and bowel are grossly unremarkable. Atherosclerotic calcification of the aorta. SKELETON: Focal hypermetabolism in the soft tissues just inferolateral to the left inferior pubic ramus does not have a CT correlate. L1 compression deformity with associated hypermetabolism. No additional abnormal hypermetabolism. Incidental CT findings: Degenerative changes in the spine. IMPRESSION: 1. Centrally obstructing right lower lobe mass with right paratracheal adenopathy, compatible with at least T3 N1 M0 or stage IIIA primary bronchogenic carcinoma. 2. Age-indeterminate L1 compression fracture. If pathologic in nature, findings would then be consistent with stage IV disease. 3. Slight septal thickening and nodularity in the right upper lobe. Difficult to exclude lymphangitic carcinomatosis. 4. 8 mm left lower lobe nodule, stable from 04/05/2021 and too small for PET resolution. Recommend attention on follow-up. 5. Postobstructive collapse/consolidation in the right middle and right lower lobes. 6. Small right pleural effusion. 7. Aortic atherosclerosis (ICD10-I70.0). Coronary artery calcification. Electronically Signed   By: Lorin Picket M.D.   On: 11/01/2021 12:04   CT Chest W Contrast  Result Date: 10/26/2021 CLINICAL DATA:  Hemoptysis, right lung mass, shortness of breath EXAM: CT CHEST WITH CONTRAST TECHNIQUE: Multidetector CT imaging of the chest was performed during intravenous contrast administration. RADIATION DOSE REDUCTION: This exam was performed according to the departmental dose-optimization program which includes automated exposure control, adjustment of the mA and/or kV according to patient size and/or use of iterative  reconstruction technique. CONTRAST:  76mL OMNIPAQUE IOHEXOL 300 MG/ML  SOLN COMPARISON:  None Available. FINDINGS: Cardiovascular: Coronary artery calcifications are seen. There is homogeneous enhancement in thoracic aorta. There are no intraluminal filling defects in central pulmonary artery branches. Evaluation of small peripheral branches in right lung is less than optimal due to extensive infiltrates in right lower lobe. There is extrinsic compression of right lower lobe pulmonary artery in the right hilum. Mediastinum/Nodes: There is a large mass lesion extending from the right hilum into subcarinal region of mediastinum measuring a proximally 7.4 x 5.1 cm. There is significant interval increase in size of the right hilar mass. There is extrinsic compression of right main bronchus and its branches in the right hilum and mediastinum. Lungs/Pleura: There is significant interval increase in size of right hilar mass measuring up to 7.4 cm. The mass is extending into the subcarinal region of the mediastinum. There is interval appearance of large infiltrate in right lower lobe suggesting atelectasis/pneumonia. In image 30 of series 5, there is a new 1.4 cm pleural-based density in the medial right apex. In image 51 of series 5, there is new 8 mm pleural-based density in right upper lobe. Small right pleural effusion is seen. There is no pneumothorax in image 74 of series 5, there is 1.3 cm nodular density in left upper lobe with no significant change. In image 102 of series 5, there is a 9 mm nodule in left lower lobe with no significant change. New small patchy infiltrates are seen in right upper lobe, right middle lobe and left upper lobe. There is no pneumothorax. Upper Abdomen: No acute findings are seen. Musculoskeletal: No acute findings are seen. IMPRESSION: There is a large central mass lesion in right hilum measuring up to 7.4 cm in size extending into the subcarinal region of mediastinum. The significant  interval increase in size suggesting progression of malignant neoplasm. There is large infiltrate in right lower lobe which may suggest pneumonia/atelectasis due to central obstructing lesion. There are new pleural-based nodular densities in right upper lobe. There are few noncalcified nodules in the left upper lobe and left lower lobe. Follow-up PET-CT may be considered to evaluate for metastatic disease. Small right pleural effusion. Extensive coronary artery calcifications are seen. There is no evidence of thoracic aortic dissection. There is no central pulmonary artery embolism. Electronically Signed   By: Elmer Picker M.D.   On: 10/26/2021 16:09          No data to display          No results found for: "NITRICOXIDE"      Assessment & Plan:   Lung mass Initially seen in January 2023 by [name] after being hospitalized for CAP. Follow up CT chest showed a 3.3x3.4 masslike consolidation involving the right hilar region. He was recommended to undergo bronchoscopy, which subsequently was canceled d/t requirement by anesthesia for cardiology clearance. He then decided that he would not like to proceed with further workup. In September 2023, contacted the office due to worsening respiratory symptoms, recurrent intermittent hemoptysis, and weight loss over the past few months. Seen on 9/5 with lengthy discussion about his lung mass and high suspicion that this is cancerous. We also discussed that his current respiratory symptoms are likely related to this. He was willing to undergo further evaluation/workup. STAT CT chest showed significant interval increase in the lung mass as well as compression of the right bronchus with related large infiltrate in the RLL. PET scan today, 11/01/2021, showed significant hypermetabolism to the mass as well as right  paratracheal nodes and a L1 lesion with hypermetabolism. I sent an urgent referral to cardiology for him to establish care and for surgical  clearance but he was never contacted for this. We were able to get him a new pt appt this Wednesday, 9/13. Set up for bronchoscopy/EBUS with [name] next week. Will need to hold Brilinta for 5 days prior. Referred to oncology. We also discussed referral to palliative care for pain management; will discuss with Dr. Delfina Redwood who is currently managing his narcotics.   Patient Instructions  Continue Trelegy 1 puff daily. Brush tongue and rinse mouth afterwards  Continue Albuterol inhaler 2 puffs or duoneb 3 mL neb every 6 hours as needed for shortness of breath or wheezing. Notify if symptoms persist despite rescue inhaler/neb use.   Urgent referral to cardiology to establish care and obtain pre-operative clearance prior to bronchoscopy - Scheduled Wednesday, 11/03/2021 at 1:30 pm with Dr. Cecille Aver at 1:15 pm.  [place], [place]  Either I or one of the patient care coordinators will call you with date and time of your bronchoscopy  Message sent to Dr. Delfina Redwood to see about adjusting your pain medications   Follow up in two weeks after bronchoscopy with [name] or Alanson Aly. If symptoms do not improve or worsen, please contact office for sooner follow up or seek emergency care.                COPD (chronic obstructive pulmonary disease) ([place]) Suspect that his progressive dyspnea is related to increased lung mass/RLL compression. He did receive some benefit from change to Trelegy. No changes to regimen today.   Coronary artery disease CAD with hx of MI. Needs surgical clearance for bronchoscopy per anesthesia. Scheduled to see Dr. Audie Box 11/03/2021 for new pt appt/surgical clearance.   I spent 42 minutes of dedicated to the care of this patient on the date of this encounter to include pre-visit review of records, face-to-face time with the patient discussing conditions above, post visit ordering of testing, clinical documentation with the electronic health record, making  appropriate referrals as documented, and communicating necessary findings to members of the patients care team.  Clayton Bibles, Ray 11/01/2021  Pt aware and understands Ray's role.

## 2021-11-02 ENCOUNTER — Telehealth: Payer: Self-pay | Admitting: Internal Medicine

## 2021-11-02 ENCOUNTER — Telehealth: Payer: Self-pay | Admitting: Nurse Practitioner

## 2021-11-02 DIAGNOSIS — F331 Major depressive disorder, recurrent, moderate: Secondary | ICD-10-CM | POA: Diagnosis not present

## 2021-11-02 DIAGNOSIS — F411 Generalized anxiety disorder: Secondary | ICD-10-CM | POA: Diagnosis not present

## 2021-11-02 DIAGNOSIS — C3491 Malignant neoplasm of unspecified part of right bronchus or lung: Secondary | ICD-10-CM | POA: Diagnosis not present

## 2021-11-02 NOTE — Telephone Encounter (Signed)
I will need the template completed on this pt so I will know what info to give the OR schedulers please.

## 2021-11-02 NOTE — Telephone Encounter (Signed)
Scheduled appt per 9/11 referral. [place] where pt lives, no answer. Will try to call again tomorrow with appt.

## 2021-11-02 NOTE — Progress Notes (Unsigned)
Cardiology Office Note:   Date:  11/03/2021  NAME:  [name]    MRN: [id number] DOB:  1955-12-26   PCP:  [name], MD  Cardiologist:  None  Electrophysiologist:  None   Referring MD: Clayton Bibles, NP   Chief Complaint  Patient presents with   Follow-up    History of Present Illness:   [name] is a 66 y.o. male with a hx of COPD, stage IV lung cancer, diabetes, CAD who is being seen today for the evaluation of see CAD at the request of Clayton Bibles, NP.   He is recently been diagnosed with lung cancer.  Will undergo bronchoscopy with biopsy.  Here for preoperative assessment.  Has not been seen by cardiology in quite some time.  Had intervention to the mid LAD in 2017.  Has not seen cardiology regularly since that time.  He reports no chest pain or trouble breathing.  He has not been active but has no limitations with his current level of activity.  He is diabetic and appears to be taking insulin.  He has never had a stroke.  He does report a history of congestive heart failure and takes Lasix daily.  He has no signs of volume overload today.  His blood pressure is well controlled.  He is a current every day smoker for over 50 years.  He currently lives in a skilled nursing facility due to poor mobility.  He is on pravastatin.  Appears to have had issues with other statin agents.  Remains on Brilinta for unclear reasons.  Again denies any chest pain symptoms.  His EKG demonstrates sinus rhythm with an old anterior infarct.  He is disabled.  He is not married.  No children.  Again resides in a skilled nursing facility.  No cardiac complaints today.  Problem List CAD -PCI RCA -PCI to mid LAD 10/2015 2. Stage IV Lung CA 3. COPD 4. DM  Past Medical History: Past Medical History:  Diagnosis Date   Anginal pain ([place])    Anxiety    Arthritis    "right hip; herniated L4-5" (11/05/2015)   Asthma    Bipolar 1 disorder (HCC)    CHF (congestive heart failure) (HCC)     Chronic bronchitis (HCC)    Chronic lower back pain    Chronic pain disorder    Community acquired pneumonia 10/30/2016   COPD (chronic obstructive pulmonary disease) ([place])    Coronary artery disease    Coronary atherosclerosis 12/05/2008   Qualifier: Diagnosis of  By: Jaramillo, [place], HX OF 12/05/2008   Qualifier: Diagnosis of  By: Jaramillo, [place], MAJOR 12/05/2008   Qualifier: Diagnosis of  By: Sidney Ace     Diabetes mellitus type 2 with complications ([place]) 10/20/9405   Diverticulitis large intestine    DVT (deep venous thrombosis) ([place]) 1991   "left calf; after 1st knee scope"   EROSIVE GASTRITIS 12/05/2008   Qualifier: Diagnosis of  By: Sidney Ace     Fungal esophagitis    GERD 12/05/2008   Qualifier: Diagnosis of  By: Sidney Ace     GERD (gastroesophageal reflux disease)    Heart attack ([place]) 2010   BMS x 2 RCA   High cholesterol    Hyperlipidemia    Hypertension    Hypothyroidism    MRSA (methicillin resistant staph aureus) culture positive    Myocardial infarction ([place])    MYOCARDIAL INFARCTION,  HX OF 12/05/2008   Qualifier: Diagnosis of  By: Sidney Ace     Obesity 12/05/2008   Qualifier: Diagnosis of  By: Sidney Ace     Pneumonia    "several times"   PULMONARY NODULE, LEFT LOWER LOBE 12/05/2008   Qualifier: Diagnosis of  By: Sidney Ace     S/P angioplasty with stent 11/05/15 PCI DES to mLAD  11/06/2015   Schizo affective schizophrenia ([place]) 2002   SOB (shortness of breath) 08/22/2016   Status post cholecystectomy 09/25/2014    Past Surgical History: Past Surgical History:  Procedure Laterality Date   APPENDECTOMY     CARDIAC CATHETERIZATION N/A 11/05/2015   Procedure: Left Heart Cath and Coronary Angiography;  Surgeon: Nelva Bush, MD;  Location: [place] [facility];  Service: Cardiovascular;  Laterality: N/A;   CARDIAC CATHETERIZATION N/A 11/05/2015   Procedure: Coronary Stent Intervention;   Surgeon: Nelva Bush, MD;  Location: [place] [facility];  Service: Cardiovascular;  Laterality: N/A;   CARDIAC CATHETERIZATION N/A 11/05/2015   Procedure: Intravascular Pressure Wire/FFR Study;  Surgeon: Nelva Bush, MD;  Location: [facility];  Service: Cardiovascular;  Laterality: N/A;   CORONARY ANGIOPLASTY WITH STENT PLACEMENT  2010   90% RCA s/p BMS x 2, 50% LAD, 30% CFX, EF 45-50%   CORONARY ANGIOPLASTY WITH STENT PLACEMENT  11/05/2015   "1 today; makes a total of 4 stents" (11/05/2015)   INGUINAL HERNIA REPAIR Right    KNEE ARTHROSCOPY Left X 3   LAPAROSCOPIC CHOLECYSTECTOMY     TONSILLECTOMY AND ADENOIDECTOMY     UPPER GI ENDOSCOPY  05/24/2016   UGI ENDO INCLUDE ESOPHAGUS, STOMACH & DUODENUM &/OR JEJUNUM; DX W/WO COLLECTION SPECIMEN BY BRUSH OR Ray; SURGEON :ALBERT San Morelle, MD LOCATION; [facility]    Current Medications: Current Meds  Medication Sig   allopurinol (ZYLOPRIM) 100 MG tablet Take 100 mg by mouth daily.   aspirin EC 81 MG tablet Take 1 tablet (81 mg total) by mouth daily. Swallow whole.   cyclobenzaprine (FLEXERIL) 10 MG tablet Take 10 mg by mouth 3 (three) times daily.   Fluticasone-Umeclidin-Vilant (TRELEGY ELLIPTA) 200-62.5-25 MCG/ACT AEPB Inhale 1 puff into the lungs daily.   furosemide (LASIX) 20 MG tablet Take 20 mg by mouth daily.   gabapentin (NEURONTIN) 400 MG capsule Take 400 mg by mouth 3 (three) times daily.   Glucagon, rDNA, (GLUCAGON EMERGENCY) 1 MG KIT Inject 1 mg as directed as needed (hypoglycemia).   HUMALOG KWIKPEN 100 UNIT/ML KwikPen Inject 2-10 Units into the skin in the morning, at noon, in the evening, and at bedtime. Per Sliding Scale   IPRATROPIUM-ALBUTEROL IN Inhale into the lungs.   iron polysaccharides (NIFEREX) 150 MG capsule Take 150 mg by mouth daily. Nu-Iron   levothyroxine (SYNTHROID) 75 MCG tablet Take 75 mcg by mouth daily before breakfast.   LORazepam (ATIVAN) 2 MG tablet Take 2 mg by mouth every 12  (twelve) hours.   metFORMIN (GLUCOPHAGE) 500 MG tablet Take 500 mg by mouth 2 (two) times daily with a meal.   metoprolol succinate (TOPROL-XL) 25 MG 24 hr tablet Take 25 mg by mouth daily.   omeprazole (PRILOSEC) 20 MG capsule Take 20 mg by mouth every morning.   Oxycodone HCl 10 MG TABS Take 10 mg by mouth every 4 (four) hours as needed (pain).   polyethylene glycol (MIRALAX / GLYCOLAX) packet Take 17 g by mouth daily as needed for mild constipation.    pravastatin (PRAVACHOL) 80 MG tablet Take 80  mg by mouth at bedtime.   QUEtiapine (SEROQUEL) 100 MG tablet Take 100 mg by mouth at bedtime.   temazepam (RESTORIL) 30 MG capsule Take 30 mg by mouth at bedtime.   therapeutic multivitamin-minerals (THERAGRAN-M) tablet Take 1 tablet by mouth daily.   venlafaxine (EFFEXOR) 75 MG tablet Take 75 mg by mouth daily.    [DISCONTINUED] ticagrelor (BRILINTA) 90 MG TABS tablet Take 90 mg by mouth 2 (two) times daily.     Allergies:    Rocephin [ceftriaxone], Zofran [ondansetron hcl], Ampicillin, Lipitor [atorvastatin], Prochlorperazine edisylate, Toradol [ketorolac tromethamine], and Tramadol   Social History: Social History   Socioeconomic History   Marital status: Divorced    Spouse name: Not on file   Number of children: 0   Years of education: Not on file   Highest education level: Not on file  Occupational History   Occupation: Disabled  Tobacco Use   Smoking status: Some Days    Packs/day: 1.00    Years: 42.00    Total pack years: 42.00    Types: Cigarettes   Smokeless tobacco: Never   Tobacco comments:    2/3 cigs per day   Vaping Use   Vaping Use: Never used  Substance and Sexual Activity   Alcohol use: No   Drug use: No   Sexual activity: Yes  Other Topics Concern   Not on file  Social History Narrative   Lives alone in [place].   Admitted to [facility]   Divorced   Smokes daily   Alcohol none   Full Code   Social Determinants of Health    Financial Resource Strain: Not on file  Food Insecurity: Not on file  Transportation Needs: Not on file  Physical Activity: Not on file  Stress: Not on file  Social Connections: Not on file     Family History: The patient's family history includes Heart Problems in his mother; Heart disease in his father.  ROS:   All other ROS reviewed and negative. Pertinent positives noted in the HPI.     EKGs/Labs/Other Studies Reviewed:   The following studies were personally reviewed by me today:  EKG:  EKG is ordered today.  The ekg ordered today demonstrates normal sinus rhythm heart rate 94, anterior infarct, and was personally reviewed by me.   Recent Labs: 05/25/2021: Magnesium 1.3 06/18/2021: ALT 12; Hemoglobin 9.6; Platelets 251 10/26/2021: BUN 14; Creatinine, Ser 0.88; Potassium 3.6; Sodium 132   Recent Lipid Panel    Component Value Date/Time   CHOL 133 02/23/2016 0000   TRIG 159 02/23/2016 0000   HDL 34 (A) 02/23/2016 0000   CHOLHDL 6.5 11/20/2008 0425   VLDL UNABLE TO CALCULATE IF TRIGLYCERIDE OVER 400 mg/dL 11/20/2008 0425   LDLCALC 67 02/23/2016 0000    Physical Exam:   VS:  BP 108/74   Pulse 92   Ht 6' (1.829 m)   Wt 195 lb 9.6 oz (88.7 kg)   SpO2 93%   BMI 26.53 kg/m    Wt Readings from Last 3 Encounters:  11/03/21 195 lb 9.6 oz (88.7 kg)  10/26/21 227 lb (103 kg)  05/25/21 227 lb (103 kg)    General: Well nourished, well developed, in no acute distress Head: Atraumatic, normal size  Eyes: PEERLA, EOMI  Neck: Supple, no JVD Endocrine: No thryomegaly Cardiac: Normal S1, S2; RRR; no murmurs, rubs, or gallops Lungs: Clear to auscultation bilaterally, no wheezing, rhonchi or rales  Abd: Soft, nontender, no  hepatomegaly  Ext: No edema, pulses 2+ Musculoskeletal: No deformities, BUE and BLE strength normal and equal Skin: Warm and dry, no rashes   Neuro: Alert and oriented to person, place, time, and situation, CNII-XII grossly intact, no focal deficits  Psych:  Normal mood and affect   ASSESSMENT:   [name] is a 66 y.o. male who presents for the following: 1. Preoperative cardiovascular examination   2. Coronary artery disease involving native coronary artery of native heart without angina pectoris   3. Mixed hyperlipidemia     PLAN:   1. Preoperative cardiovascular examination -Diagnosis of lung mass with likely stage IV lung cancer.  Needs bronchoscopy.  No cardiac complaints.  Euvolemic on exam.  EKG with old anterior infarct.  To me his procedure is urgent.  Given his lack of symptoms I would not recommend further testing.  He reports he can walk and do activities around his skilled nursing facility without limitations.  I think this is enough in the setting of malignancy.  He may proceed to surgery at acceptable risk.  2. Coronary artery disease involving native coronary artery of native heart without angina pectoris 3. Mixed hyperlipidemia -History of multiple PCI's in the past.  Recent in 2017 to the mid LAD.  Has remained on Brilinta.  Would stop Brilinta.  Put him on aspirin 81 mg daily.  I would like to recheck an echo to see where his heart function is.  We will also recheck lipids.  He is currently on pravastatin and has had issues with other cholesterol-lowering medications.  We will further titrate therapy based on this.  His EKG demonstrates sinus rhythm with an old anterior infarct.  No complaints of angina today.  Currently on metoprolol.  Blood pressure controlled.  Disposition: Return in about 1 year (around 11/04/2022).  Medication Adjustments/Labs and Tests Ordered: Current medicines are reviewed at length with the patient today.  Concerns regarding medicines are outlined above.  Orders Placed This Encounter  Procedures   Lipid panel   EKG 12-Lead   ECHOCARDIOGRAM COMPLETE   Meds ordered this encounter  Medications   aspirin EC 81 MG tablet    Sig: Take 1 tablet (81 mg total) by mouth daily. Swallow whole.     Dispense:  90 tablet    Refill:  3    Patient Instructions  Medication Instructions:  STOP Brilinta START Aspirin 81 mg daily   *If you need a refill on your cardiac medications before your next appointment, please call your pharmacy*   Lab Work: LIPID today   If you have labs (blood work) drawn today and your tests are completely normal, you will receive your results only by: [place] (if you have MyChart) OR A paper copy in the mail If you have any lab test that is abnormal or we need to change your treatment, we will call you to review the results.   Testing/Procedures: Echocardiogram - Your physician has requested that you have an echocardiogram. Echocardiography is a painless test that uses sound waves to create images of your heart. It provides your doctor with information about the size and shape of your heart and how well your heart's chambers and valves are working. This procedure takes approximately one hour. There are no restrictions for this procedure.     Follow-Up: At [facility], you and your health needs are our priority.  As part of our continuing mission to provide you with exceptional heart care, we have created designated Provider  Care Teams.  These Care Teams include your primary Cardiologist (physician) and Advanced Practice Providers (APPs -  Physician Assistants and Nurse Practitioners) who all work together to provide you with the care you need, when you need it.  We recommend signing up for the patient portal called "MyChart".  Sign up information is provided on this After Visit Summary.  MyChart is used to connect with patients for Virtual Visits (Telemedicine).  Patients are able to view lab/test results, encounter notes, upcoming appointments, etc.  Non-urgent messages can be sent to your provider as well.   To learn more about what you can do with MyChart, go to [email or web address].    Your next appointment:   12 month(s)  The  format for your next appointment:   In Person  Provider:   Eleonore Chiquito, MD            Signed, Addison Naegeli. Audie Box, MD, [place]  [address], [place], [place] [address] [phone]  11/03/2021 2:30 PM

## 2021-11-03 ENCOUNTER — Encounter: Payer: Self-pay | Admitting: Cardiovascular Disease

## 2021-11-03 ENCOUNTER — Ambulatory Visit: Payer: Medicare HMO | Attending: Cardiovascular Disease | Admitting: Cardiovascular Disease

## 2021-11-03 ENCOUNTER — Other Ambulatory Visit: Payer: Self-pay

## 2021-11-03 VITALS — BP 108/74 | HR 92 | Ht 72.0 in | Wt 195.6 lb

## 2021-11-03 DIAGNOSIS — I2510 Atherosclerotic heart disease of native coronary artery without angina pectoris: Secondary | ICD-10-CM | POA: Diagnosis not present

## 2021-11-03 DIAGNOSIS — I10 Essential (primary) hypertension: Secondary | ICD-10-CM | POA: Diagnosis not present

## 2021-11-03 DIAGNOSIS — E782 Mixed hyperlipidemia: Secondary | ICD-10-CM | POA: Diagnosis not present

## 2021-11-03 DIAGNOSIS — D649 Anemia, unspecified: Secondary | ICD-10-CM | POA: Diagnosis not present

## 2021-11-03 DIAGNOSIS — J449 Chronic obstructive pulmonary disease, unspecified: Secondary | ICD-10-CM | POA: Diagnosis not present

## 2021-11-03 DIAGNOSIS — Z01810 Encounter for preprocedural cardiovascular examination: Secondary | ICD-10-CM | POA: Diagnosis not present

## 2021-11-03 DIAGNOSIS — F209 Schizophrenia, unspecified: Secondary | ICD-10-CM | POA: Diagnosis not present

## 2021-11-03 MED ORDER — ASPIRIN 81 MG PO TBEC: 81.0000 mg | DELAYED_RELEASE_TABLET | Freq: Every day | ORAL | 3 refills | Status: DC

## 2021-11-03 NOTE — Patient Instructions (Signed)
Medication Instructions:  STOP Brilinta START Aspirin 81 mg daily   *If you need a refill on your cardiac medications before your next appointment, please call your pharmacy*   Lab Work: LIPID today   If you have labs (blood work) drawn today and your tests are completely normal, you will receive your results only by: [place] (if you have MyChart) OR A paper copy in the mail If you have any lab test that is abnormal or we need to change your treatment, we will call you to review the results.   Testing/Procedures: Echocardiogram - Your physician has requested that you have an echocardiogram. Echocardiography is a painless test that uses sound waves to create images of your heart. It provides your doctor with information about the size and shape of your heart and how well your heart's chambers and valves are working. This procedure takes approximately one hour. There are no restrictions for this procedure.     Follow-Up: At [facility], you and your health needs are our priority.  As part of our continuing mission to provide you with exceptional heart care, we have created designated Provider Care Teams.  These Care Teams include your primary Cardiologist (physician) and Advanced Practice Providers (APPs -  Physician Assistants and Nurse Practitioners) who all work together to provide you with the care you need, when you need it.  We recommend signing up for the patient portal called "MyChart".  Sign up information is provided on this After Visit Summary.  MyChart is used to connect with patients for Virtual Visits (Telemedicine).  Patients are able to view lab/test results, encounter notes, upcoming appointments, etc.  Non-urgent messages can be sent to your provider as well.   To learn more about what you can do with MyChart, go to [email or web address].    Your next appointment:   12 month(s)  The format for your next appointment:   In Person  Provider:   Eleonore Chiquito, MD

## 2021-11-03 NOTE — Telephone Encounter (Signed)
Pt has been scheduled at [place] on 9/26 at 12:45 by Santiago Glad.  I called pt's # and spoke to Franklyn Lor who is nurse at [facility].  Made her aware pt is to stop Brilinta 5 days prior.  He is a resident there.  He will get covid test the morning of the procedure and will arrive at 10:15.  I spoke to Santiago Glad in Endo and made her aware.  Theadora Rama states no need for me to send them a letter.

## 2021-11-04 ENCOUNTER — Other Ambulatory Visit (HOSPITAL_COMMUNITY): Payer: Medicare HMO

## 2021-11-04 DIAGNOSIS — F411 Generalized anxiety disorder: Secondary | ICD-10-CM | POA: Diagnosis not present

## 2021-11-04 DIAGNOSIS — F331 Major depressive disorder, recurrent, moderate: Secondary | ICD-10-CM | POA: Diagnosis not present

## 2021-11-04 DIAGNOSIS — C3491 Malignant neoplasm of unspecified part of right bronchus or lung: Secondary | ICD-10-CM | POA: Diagnosis not present

## 2021-11-04 LAB — LIPID PANEL
Chol/HDL Ratio: 2.6 ratio (ref 0.0–5.0)
Cholesterol, Total: 91 mg/dL — ABNORMAL LOW (ref 100–199)
HDL: 35 mg/dL — ABNORMAL LOW (ref >39)
LDL Chol Calc (NIH): 31 mg/dL (ref 0–99)
Triglycerides: 148 mg/dL (ref 0–149)
VLDL Cholesterol Cal: 25 mg/dL (ref 5–40)

## 2021-11-04 NOTE — Telephone Encounter (Signed)
No PA Req:

## 2021-11-05 DIAGNOSIS — D72829 Elevated white blood cell count, unspecified: Secondary | ICD-10-CM | POA: Diagnosis not present

## 2021-11-05 DIAGNOSIS — I10 Essential (primary) hypertension: Secondary | ICD-10-CM | POA: Diagnosis not present

## 2021-11-05 DIAGNOSIS — C3491 Malignant neoplasm of unspecified part of right bronchus or lung: Secondary | ICD-10-CM | POA: Diagnosis not present

## 2021-11-09 ENCOUNTER — Ambulatory Visit: Payer: Medicare HMO | Admitting: Nurse Practitioner

## 2021-11-09 DIAGNOSIS — C3491 Malignant neoplasm of unspecified part of right bronchus or lung: Secondary | ICD-10-CM | POA: Diagnosis not present

## 2021-11-10 ENCOUNTER — Encounter (HOSPITAL_COMMUNITY): Payer: Self-pay | Admitting: Pulmonary Disease

## 2021-11-10 NOTE — Progress Notes (Signed)
Preop instructions for: Javi Bollman   Date of Birth:      November 11, 1955                 Date of Procedure:  11/16/21  Procedure:     Bronchoscopy Surgeon: [name] Facility contact:   Mendel Corning Phone:  [phone]           RN contact name/phone#:   Theadora Rama [phone] and Fax #: [phone]   Transportation contact phone#: Facility transportation    Time to arrive at [facility]: 1015 for covid test   Report to: Admitting - Go through main entrance of hospital and tell front desk you are having a procedure done, and they will show how to get to admitting dept.   Do not eat solid food past midnight the night before your procedure.(To include any tube feedings-must be discontinued)  May have the following until 0845 am    AM/PM day of procedure  CLEAR LIQUID DIET  Water Black Coffee (sugar ok, NO MILK/CREAM OR CREAMERS)  Tea (sugar ok, NO MILK/CREAM OR CREAMERS) regular and decaf                             Plain Jell-O (NO RED)                                           Fruit ices (not with fruit pulp, NO RED)                                     Popsicles (NO RED)                                                                  Juice: apple, WHITE grape, WHITE cranberry Sports drinks like Gatorade (NO RED)   Take these morning medications only with sips of water.(or give through gastrostomy or feeding tube):   Aspirin, Ativan, Gabapentin, Florastor, metoprolol, Synthroid, Prilosec    Note: No Insulin or Diabetic meds should be given or taken the morning of the procedure!     Please send day of procedure: current med list and meds last taken that day, confirm nothing by mouth status from what time, Patient Demographic info( to include DNR status, problem list, allergies)   Bring Insurance card and picture ID Leave all jewelry and other valuables at place where living ( no metal or rings to be worn) No contact lens Women-no make-up, no  lotions,perfumes,powders Men-no colognes,lotions   Any questions prior to procedure call pre surg nurse Morey Hummingbird [phone]  Any questions DAY OF procedure, call ENDO [phone]   Sent from :[facility]                   [place]                   [phone]   Sent by : Morey Hummingbird, RN

## 2021-11-12 ENCOUNTER — Other Ambulatory Visit: Payer: Self-pay

## 2021-11-12 DIAGNOSIS — R918 Other nonspecific abnormal finding of lung field: Secondary | ICD-10-CM

## 2021-11-12 DIAGNOSIS — E039 Hypothyroidism, unspecified: Secondary | ICD-10-CM | POA: Diagnosis not present

## 2021-11-12 DIAGNOSIS — R0602 Shortness of breath: Secondary | ICD-10-CM | POA: Diagnosis not present

## 2021-11-12 DIAGNOSIS — E0821 Diabetes mellitus due to underlying condition with diabetic nephropathy: Secondary | ICD-10-CM | POA: Diagnosis not present

## 2021-11-12 DIAGNOSIS — I10 Essential (primary) hypertension: Secondary | ICD-10-CM | POA: Diagnosis not present

## 2021-11-15 ENCOUNTER — Other Ambulatory Visit: Payer: Self-pay

## 2021-11-15 ENCOUNTER — Inpatient Hospital Stay: Payer: Medicare HMO

## 2021-11-15 ENCOUNTER — Inpatient Hospital Stay: Payer: Medicare HMO | Attending: Internal Medicine | Admitting: Internal Medicine

## 2021-11-15 VITALS — BP 95/69 | HR 95 | Temp 98.4°F | Resp 18

## 2021-11-15 DIAGNOSIS — E785 Hyperlipidemia, unspecified: Secondary | ICD-10-CM | POA: Diagnosis not present

## 2021-11-15 DIAGNOSIS — Z7982 Long term (current) use of aspirin: Secondary | ICD-10-CM | POA: Insufficient documentation

## 2021-11-15 DIAGNOSIS — I252 Old myocardial infarction: Secondary | ICD-10-CM | POA: Diagnosis not present

## 2021-11-15 DIAGNOSIS — C3432 Malignant neoplasm of lower lobe, left bronchus or lung: Secondary | ICD-10-CM

## 2021-11-15 DIAGNOSIS — E039 Hypothyroidism, unspecified: Secondary | ICD-10-CM | POA: Insufficient documentation

## 2021-11-15 DIAGNOSIS — R5383 Other fatigue: Secondary | ICD-10-CM

## 2021-11-15 DIAGNOSIS — Z794 Long term (current) use of insulin: Secondary | ICD-10-CM | POA: Diagnosis not present

## 2021-11-15 DIAGNOSIS — I110 Hypertensive heart disease with heart failure: Secondary | ICD-10-CM | POA: Diagnosis not present

## 2021-11-15 DIAGNOSIS — Z79899 Other long term (current) drug therapy: Secondary | ICD-10-CM | POA: Diagnosis not present

## 2021-11-15 DIAGNOSIS — Z7984 Long term (current) use of oral hypoglycemic drugs: Secondary | ICD-10-CM | POA: Diagnosis not present

## 2021-11-15 DIAGNOSIS — E119 Type 2 diabetes mellitus without complications: Secondary | ICD-10-CM | POA: Insufficient documentation

## 2021-11-15 DIAGNOSIS — C3490 Malignant neoplasm of unspecified part of unspecified bronchus or lung: Secondary | ICD-10-CM

## 2021-11-15 DIAGNOSIS — I509 Heart failure, unspecified: Secondary | ICD-10-CM | POA: Diagnosis not present

## 2021-11-15 DIAGNOSIS — J449 Chronic obstructive pulmonary disease, unspecified: Secondary | ICD-10-CM | POA: Diagnosis not present

## 2021-11-15 DIAGNOSIS — R059 Cough, unspecified: Secondary | ICD-10-CM

## 2021-11-15 DIAGNOSIS — R918 Other nonspecific abnormal finding of lung field: Secondary | ICD-10-CM

## 2021-11-15 DIAGNOSIS — I2510 Atherosclerotic heart disease of native coronary artery without angina pectoris: Secondary | ICD-10-CM | POA: Diagnosis not present

## 2021-11-15 DIAGNOSIS — D649 Anemia, unspecified: Secondary | ICD-10-CM | POA: Diagnosis not present

## 2021-11-15 DIAGNOSIS — F17210 Nicotine dependence, cigarettes, uncomplicated: Secondary | ICD-10-CM | POA: Diagnosis not present

## 2021-11-15 DIAGNOSIS — Z8249 Family history of ischemic heart disease and other diseases of the circulatory system: Secondary | ICD-10-CM

## 2021-11-15 DIAGNOSIS — F419 Anxiety disorder, unspecified: Secondary | ICD-10-CM

## 2021-11-15 DIAGNOSIS — F319 Bipolar disorder, unspecified: Secondary | ICD-10-CM

## 2021-11-15 LAB — CBC WITH DIFFERENTIAL (CANCER CENTER ONLY)
Abs Immature Granulocytes: 0.04 10*3/uL (ref 0.00–0.07)
Basophils Absolute: 0 10*3/uL (ref 0.0–0.1)
Basophils Relative: 0 %
Eosinophils Absolute: 0.2 10*3/uL (ref 0.0–0.5)
Eosinophils Relative: 2 %
HCT: 27.6 % — ABNORMAL LOW (ref 39.0–52.0)
Hemoglobin: 9.1 g/dL — ABNORMAL LOW (ref 13.0–17.0)
Immature Granulocytes: 0 %
Lymphocytes Relative: 27 %
Lymphs Abs: 2.8 10*3/uL (ref 0.7–4.0)
MCH: 25.2 pg — ABNORMAL LOW (ref 26.0–34.0)
MCHC: 33 g/dL (ref 30.0–36.0)
MCV: 76.5 fL — ABNORMAL LOW (ref 80.0–100.0)
Monocytes Absolute: 0.7 10*3/uL (ref 0.1–1.0)
Monocytes Relative: 7 %
Neutro Abs: 6.7 10*3/uL (ref 1.7–7.7)
Neutrophils Relative %: 64 %
Platelet Count: 232 10*3/uL (ref 150–400)
RBC: 3.61 MIL/uL — ABNORMAL LOW (ref 4.22–5.81)
RDW: 16 % — ABNORMAL HIGH (ref 11.5–15.5)
WBC Count: 10.4 10*3/uL (ref 4.0–10.5)
nRBC: 0 % (ref 0.0–0.2)

## 2021-11-15 LAB — CMP (CANCER CENTER ONLY)
ALT: 9 U/L (ref 0–44)
AST: 14 U/L — ABNORMAL LOW (ref 15–41)
Albumin: 3.6 g/dL (ref 3.5–5.0)
Alkaline Phosphatase: 114 U/L (ref 38–126)
Anion gap: 8 (ref 5–15)
BUN: 13 mg/dL (ref 8–23)
CO2: 29 mmol/L (ref 22–32)
Calcium: 8.9 mg/dL (ref 8.9–10.3)
Chloride: 97 mmol/L — ABNORMAL LOW (ref 98–111)
Creatinine: 0.85 mg/dL (ref 0.61–1.24)
GFR, Estimated: >60 mL/min (ref >60)
Glucose, Bld: 92 mg/dL (ref 70–99)
Potassium: 3.9 mmol/L (ref 3.5–5.1)
Sodium: 134 mmol/L — ABNORMAL LOW (ref 135–145)
Total Bilirubin: 0.4 mg/dL (ref 0.3–1.2)
Total Protein: 7.6 g/dL (ref 6.5–8.1)

## 2021-11-15 NOTE — Progress Notes (Signed)
[facility] [phone]   [phone]  CONSULT NOTE  REFERRING PHYSICIAN: Dr. Arabella Merles  REASON FOR CONSULTATION:  66 years old white male with suspicious lung cancer.  HPI [name] is a 66 y.o. male with past medical history significant for multiple medical problems including congestive heart failure, COPD, bipolar disorder, coronary artery disease, diabetes mellitus, dyslipidemia, hypertension, hypothyroidism, and anxiety, asthma as well as long history for smoking.  The patient mentioned that in February 2023 he had hemoptysis that was going on for 2 weeks.  He was seen at the hospital at that time and CT scan of the chest on 04/05/2021 showed 3.3 x 3.4 cm rounded masslike consolidation involving the right hilar/infrahilar region suspicious for malignancy.  There was occlusion of the bronchus intermedius with partial consolidative changes of the right lower lobe which represent postobstructive atelectasis or pneumonia.  There was also a 0.9 cm nodule in the left lower lobe.  For some reason the patient was lost to follow-up because he was scared about the next step in management of his condition.  He presented to [facility] again on 10/26/2021 complaining of hemoptysis and shortness of breath.  CT scan of the chest with contrast on 10/26/2021 showed a large mass lesion extending from the right hilum into the subcarinal region of the mediastinum measuring approximately 7.4 x 5.1 cm.  There was significant interval increase in the size of the right hilar mass with extrinsic compression of the right main bronchus and its branches in the right hilum and mediastinum.  There was interval appearance of large infiltrate in the right lower lobe suggesting atelectasis/pneumonia.  There was a new 1.4 cm pleural-based density in the medial right apex and a 0.8 cm pleural-based density in the right upper lobe.  Small right pleural effusion was seen and there was a  1.3 cm nodular density in the left upper lobe with no significant changes.  There was also a 0.9 cm nodule in the left lower lobe with no significant change.  The patient had a PET scan on 11/01/2021 and it showed central obstructing right lower lobe mass with right paratracheal adenopathy compatible with at least T3, N1, M0 or stage IIIa primary bronchogenic carcinoma.  There was age indeterminate L1 compression fracture and if suspicious for pathologic fracture this will be consistent with a stage IV disease.  There was a slight pleural thickening and nodularity in the right upper lobe.  There was 0.8 cm left lower lobe nodule stable from February 2023 and below the PET resolution. The patient was referred to me today for evaluation and recommendation regarding treatment of his condition. When seen today he is feeling fine with no concerning complaints except for cough but no significant chest pain, shortness of breath or hemoptysis.  He has no nausea, vomiting, diarrhea or constipation.  He has no headache or visual changes.  He has no recent weight loss or night sweats. Family history significant for father and mother with heart disease. The patient is single and has 1 daughter [name] who lives in [place] but they are not close to each other according to the patient.  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  He is currently a resident of the [facility].  He has a history for smoking more than 1 pack/day for around 50 years and unfortunately continues to smoke.  He has no history of alcohol or drug abuse.  HPI  Past Medical  History:  Diagnosis Date   Anginal pain ([place])    Anxiety    Arthritis    "right hip; herniated L4-5" (11/05/2015)   Asthma    Bipolar 1 disorder (HCC)    CHF (congestive heart failure) (HCC)    Chronic bronchitis (HCC)    Chronic lower back pain    Chronic pain disorder    Community acquired pneumonia 10/30/2016   COPD (chronic obstructive pulmonary  disease) ([place])    Coronary artery disease    Coronary atherosclerosis 12/05/2008   Qualifier: Diagnosis of  By: Jaramillo, [place], HX OF 12/05/2008   Qualifier: Diagnosis of  By: Jaramillo, [place], MAJOR 12/05/2008   Qualifier: Diagnosis of  By: Sidney Ace     Diabetes mellitus type 2 with complications ([place]) 4/48/1856   Diverticulitis large intestine    DVT (deep venous thrombosis) ([place]) 1991   "left calf; after 1st knee scope"   EROSIVE GASTRITIS 12/05/2008   Qualifier: Diagnosis of  By: Sidney Ace     Fungal esophagitis    GERD 12/05/2008   Qualifier: Diagnosis of  By: Sidney Ace     GERD (gastroesophageal reflux disease)    Heart attack ([place]) 2010   BMS x 2 RCA   High cholesterol    Hyperlipidemia    Hypertension    Hypothyroidism    MRSA (methicillin resistant staph aureus) culture positive    Myocardial infarction ([place])    MYOCARDIAL INFARCTION, HX OF 12/05/2008   Qualifier: Diagnosis of  By: Sidney Ace     Obesity 12/05/2008   Qualifier: Diagnosis of  By: Sidney Ace     Pneumonia    "several times"   PULMONARY NODULE, LEFT LOWER LOBE 12/05/2008   Qualifier: Diagnosis of  By: Sidney Ace     S/P angioplasty with stent 11/05/15 PCI DES to mLAD  11/06/2015   Schizo affective schizophrenia ([place]) 2002   SOB (shortness of breath) 08/22/2016   Status post cholecystectomy 09/25/2014    Past Surgical History:  Procedure Laterality Date   APPENDECTOMY     CARDIAC CATHETERIZATION N/A 11/05/2015   Procedure: Left Heart Cath and Coronary Angiography;  Surgeon: Nelva Bush, MD;  Location: [facility];  Service: Cardiovascular;  Laterality: N/A;   CARDIAC CATHETERIZATION N/A 11/05/2015   Procedure: Coronary Stent Intervention;  Surgeon: Nelva Bush, MD;  Location: [facility];  Service: Cardiovascular;  Laterality: N/A;   CARDIAC CATHETERIZATION N/A 11/05/2015   Procedure: Intravascular Pressure  Wire/FFR Study;  Surgeon: Nelva Bush, MD;  Location: [facility];  Service: Cardiovascular;  Laterality: N/A;   CORONARY ANGIOPLASTY WITH STENT PLACEMENT  2010   90% RCA s/p BMS x 2, 50% LAD, 30% CFX, EF 45-50%   CORONARY ANGIOPLASTY WITH STENT PLACEMENT  11/05/2015   "1 today; makes a total of 4 stents" (11/05/2015)   INGUINAL HERNIA REPAIR Right    KNEE ARTHROSCOPY Left X 3   LAPAROSCOPIC CHOLECYSTECTOMY     TONSILLECTOMY AND ADENOIDECTOMY     UPPER GI ENDOSCOPY  05/24/2016   UGI ENDO INCLUDE ESOPHAGUS, STOMACH & DUODENUM &/OR JEJUNUM; DX W/WO COLLECTION SPECIMEN BY [place] OR [place]; SURGEON :ALBERT San Morelle, MD LOCATION; [facility]    Family History  Problem Relation Age of Onset   Heart Problems Mother        68   Heart disease Father        68  Social History Social History   Tobacco Use   Smoking status: Some Days    Packs/day: 1.00    Years: 42.00    Total pack years: 42.00    Types: Cigarettes   Smokeless tobacco: Never   Tobacco comments:    2/3 cigs per day   Vaping Use   Vaping Use: Never used  Substance Use Topics   Alcohol use: No   Drug use: No    Allergies  Allergen Reactions   Rocephin [Ceftriaxone] Anaphylaxis   Zofran [Ondansetron Hcl] Swelling    sts had tongue swelling and trouble breathing   Ampicillin     Per MAR    Lipitor [Atorvastatin]     Per MAR    Prochlorperazine Edisylate Other (See Comments)    Childhood Reaction.   Toradol [Ketorolac Tromethamine] Nausea And Vomiting    Pt states he is not allergic to ibuprofen   Tramadol Nausea And Vomiting    Not listed on MAR     Current Outpatient Medications  Medication Sig Dispense Refill   acetaminophen (TYLENOL) 325 MG tablet Take 650 mg by mouth every 6 (six) hours as needed (pain.).     albuterol (PROVENTIL) (2.5 MG/3ML) 0.083% nebulizer solution Take 2.5 mg by nebulization every 6 (six) hours as needed for wheezing or shortness of breath.     albuterol  (VENTOLIN HFA) 108 (90 Base) MCG/ACT inhaler Inhale 2 puffs into the lungs every 6 (six) hours as needed for wheezing or shortness of breath.     allopurinol (ZYLOPRIM) 100 MG tablet Take 100 mg by mouth daily. (0900)     aspirin EC 81 MG tablet Take 1 tablet (81 mg total) by mouth daily. Swallow whole. 90 tablet 3   cyclobenzaprine (FLEXERIL) 10 MG tablet Take 10 mg by mouth 3 (three) times daily. (0900, 1300 & 2100)     ferrous sulfate 325 (65 FE) MG tablet Take 325 mg by mouth in the morning and at bedtime. (0800 & 1600)     Fluticasone-Umeclidin-Vilant (TRELEGY ELLIPTA) 200-62.5-25 MCG/ACT AEPB Inhale 1 puff into the lungs daily. 14 each 0   furosemide (LASIX) 20 MG tablet Take 20 mg by mouth in the morning. (0900)     gabapentin (NEURONTIN) 400 MG capsule Take 400 mg by mouth 3 (three) times daily. (0900, 1300 & 2100)     Glucagon, rDNA, (GLUCAGON EMERGENCY) 1 MG KIT Inject 1 mg as directed as needed (hypoglycemia).     HUMALOG KWIKPEN 100 UNIT/ML KwikPen Inject 0-10 Units into the skin in the morning, at noon, in the evening, and at bedtime. Per Sliding Scale: 0-150=0 units 151-200=2 units 201-250=4 units 251-350=8 units 351-400=10 units     ipratropium-albuterol (DUONEB) 0.5-2.5 (3) MG/3ML SOLN Inhale 3 mLs into the lungs every 4 (four) hours as needed (wheezing/COPD).     levofloxacin (LEVAQUIN) 750 MG tablet Take 750 mg by mouth at bedtime.     levothyroxine (SYNTHROID) 75 MCG tablet Take 75 mcg by mouth daily before breakfast.     LORazepam (ATIVAN) 2 MG tablet Take 2 mg by mouth every 12 (twelve) hours. (0800 & 2000)     metFORMIN (GLUCOPHAGE) 500 MG tablet Take 500 mg by mouth 2 (two) times daily with a meal. (0900 & 1700)     metoprolol succinate (TOPROL-XL) 25 MG 24 hr tablet Take 25 mg by mouth in the morning. (0900)     omeprazole (PRILOSEC) 20 MG capsule Take 20 mg by mouth in the morning.  Oxycodone HCl 10 MG TABS Take 10 mg by mouth every 6 (six) hours as needed (pain).      polyethylene glycol (MIRALAX / GLYCOLAX) packet Take 17 g by mouth daily as needed for mild constipation.      pravastatin (PRAVACHOL) 80 MG tablet Take 80 mg by mouth at bedtime. (2000)     QUEtiapine (SEROQUEL) 100 MG tablet Take 100 mg by mouth at bedtime. (2000)     saccharomyces boulardii (FLORASTOR) 250 MG capsule Take 250 mg by mouth 2 (two) times daily. (0900 & 2100)     temazepam (RESTORIL) 30 MG capsule Take 30 mg by mouth at bedtime. (2000)     therapeutic multivitamin-minerals (THERAGRAN-M) tablet Take 1 tablet by mouth in the morning. (0900)     venlafaxine (EFFEXOR) 75 MG tablet Take 75 mg by mouth in the morning. (0900)     No current facility-administered medications for this visit.    Review of Systems  Constitutional: positive for fatigue Eyes: negative Ears, nose, mouth, throat, and face: negative Respiratory: positive for cough Cardiovascular: negative Gastrointestinal: negative Genitourinary:negative Integument/breast: negative Hematologic/lymphatic: negative Musculoskeletal:negative Neurological: negative Behavioral/Psych: negative Endocrine: negative Allergic/Immunologic: negative  Physical Exam  MOL:MBEML, healthy, no distress, well nourished, and well developed SKIN: skin color, texture, turgor are normal, no rashes or significant lesions HEAD: Normocephalic, No masses, lesions, tenderness or abnormalities EYES: normal, PERRLA, Conjunctiva are pink and non-injected EARS: External ears normal, Canals clear OROPHARYNX:no exudate, no erythema, and lips, buccal mucosa, and tongue normal  NECK: supple, no adenopathy, no JVD LYMPH:  no palpable lymphadenopathy, no hepatosplenomegaly LUNGS: coarse sounds heard, decreased breath sounds HEART: regular rate & rhythm, no murmurs, and no gallops ABDOMEN:abdomen soft, non-tender, normal bowel sounds, and no masses or organomegaly BACK: Back symmetric, no curvature., No CVA tenderness EXTREMITIES:no joint  deformities, effusion, or inflammation, no edema  NEURO: alert & oriented x 3 with fluent speech, no focal motor/sensory deficits  PERFORMANCE STATUS: ECOG 1  LABORATORY DATA: Lab Results  Component Value Date   WBC 10.3 06/18/2021   HGB 9.6 (L) 06/18/2021   HCT 28.8 (L) 06/18/2021   MCV 85.0 06/18/2021   PLT 251 06/18/2021      Chemistry      Component Value Date/Time   NA 132 (L) 10/26/2021 1107   NA 138 01/21/2017 0000   K 3.6 10/26/2021 1107   CL 94 (L) 10/26/2021 1107   CO2 24 10/26/2021 1107   BUN 14 10/26/2021 1107   BUN 16 01/21/2017 0000   CREATININE 0.88 10/26/2021 1107   GLU 103 10/25/2016 0000      Component Value Date/Time   CALCIUM 9.1 10/26/2021 1107   ALKPHOS 103 06/18/2021 1801   AST 13 (L) 06/18/2021 1801   ALT 12 06/18/2021 1801   BILITOT 0.5 06/18/2021 1801       RADIOGRAPHIC STUDIES: NM PET Image Initial (PI) Skull Base To Thigh (F-18 FDG)  Result Date: 11/01/2021 CLINICAL DATA:  Initial treatment strategy for lung nodule. EXAM: NUCLEAR MEDICINE PET SKULL BASE TO THIGH TECHNIQUE: 11.3 mCi F-18 FDG was injected intravenously. Full-ring PET imaging was performed from the skull base to thigh after the radiotracer. CT data was obtained and used for attenuation correction and anatomic localization. Fasting blood glucose: 103 mg/dl COMPARISON:  CT chest 10/26/2021, 04/05/2021, 04/13/2020. FINDINGS: Mediastinal blood pool activity: SUV max 3.0 Liver activity: SUV max NA NECK: No abnormal hypermetabolism. Incidental CT findings: None. CHEST: Hypermetabolic masslike consolidation in the right lower lobe with a  centrally obstructing mass measuring approximately 3.8 x 6.1 cm (4/93), SUV max 31.0. Mildly hypermetabolic right paratracheal lymph nodes with index mid right paratracheal lymph node measuring 9 mm (4/72), SUV max 3.9. Incidental CT findings: Atherosclerotic calcification of the aorta and coronary arteries. Heart is at the upper limits of normal in size. No  pericardial effusion. Small right pleural effusion. Central right lower lobe mass, as described above, severely narrows the right upper lobe bronchus and completely obstructs the bronchus intermedius. Associated collapse/consolidation in the right middle and right lower lobes. Slight septal thickening and nodularity in the aerated right upper lobe. 8 mm anterior left lower lobe nodule (7/42), stable from 04/05/2021 and too small for PET resolution. ABDOMEN/PELVIS: No abnormal hypermetabolism in the liver, adrenal glands, spleen or pancreas. No hypermetabolic lymph nodes. Incidental CT findings: Liver is unremarkable. Cholecystectomy. Adrenal glands, kidneys, spleen, pancreas, stomach and bowel are grossly unremarkable. Atherosclerotic calcification of the aorta. SKELETON: Focal hypermetabolism in the soft tissues just inferolateral to the left inferior pubic ramus does not have a CT correlate. L1 compression deformity with associated hypermetabolism. No additional abnormal hypermetabolism. Incidental CT findings: Degenerative changes in the spine. IMPRESSION: 1. Centrally obstructing right lower lobe mass with right paratracheal adenopathy, compatible with at least T3 N1 M0 or stage IIIA primary bronchogenic carcinoma. 2. Age-indeterminate L1 compression fracture. If pathologic in nature, findings would then be consistent with stage IV disease. 3. Slight septal thickening and nodularity in the right upper lobe. Difficult to exclude lymphangitic carcinomatosis. 4. 8 mm left lower lobe nodule, stable from 04/05/2021 and too small for PET resolution. Recommend attention on follow-up. 5. Postobstructive collapse/consolidation in the right middle and right lower lobes. 6. Small right pleural effusion. 7. Aortic atherosclerosis (ICD10-I70.0). Coronary artery calcification. Electronically Signed   By: Lorin Picket M.D.   On: 11/01/2021 12:04   CT Chest W Contrast  Result Date: 10/26/2021 CLINICAL DATA:  Hemoptysis,  right lung mass, shortness of breath EXAM: CT CHEST WITH CONTRAST TECHNIQUE: Multidetector CT imaging of the chest was performed during intravenous contrast administration. RADIATION DOSE REDUCTION: This exam was performed according to the departmental dose-optimization program which includes automated exposure control, adjustment of the mA and/or kV according to patient size and/or use of iterative reconstruction technique. CONTRAST:  42mL OMNIPAQUE IOHEXOL 300 MG/ML  SOLN COMPARISON:  None Available. FINDINGS: Cardiovascular: Coronary artery calcifications are seen. There is homogeneous enhancement in thoracic aorta. There are no intraluminal filling defects in central pulmonary artery branches. Evaluation of small peripheral branches in right lung is less than optimal due to extensive infiltrates in right lower lobe. There is extrinsic compression of right lower lobe pulmonary artery in the right hilum. Mediastinum/Nodes: There is a large mass lesion extending from the right hilum into subcarinal region of mediastinum measuring a proximally 7.4 x 5.1 cm. There is significant interval increase in size of the right hilar mass. There is extrinsic compression of right main bronchus and its branches in the right hilum and mediastinum. Lungs/Pleura: There is significant interval increase in size of right hilar mass measuring up to 7.4 cm. The mass is extending into the subcarinal region of the mediastinum. There is interval appearance of large infiltrate in right lower lobe suggesting atelectasis/pneumonia. In image 30 of series 5, there is a new 1.4 cm pleural-based density in the medial right apex. In image 51 of series 5, there is new 8 mm pleural-based density in right upper lobe. Small right pleural effusion is seen. There is no pneumothorax  in image 74 of series 5, there is 1.3 cm nodular density in left upper lobe with no significant change. In image 102 of series 5, there is a 9 mm nodule in left lower lobe  with no significant change. New small patchy infiltrates are seen in right upper lobe, right middle lobe and left upper lobe. There is no pneumothorax. Upper Abdomen: No acute findings are seen. Musculoskeletal: No acute findings are seen. IMPRESSION: There is a large central mass lesion in right hilum measuring up to 7.4 cm in size extending into the subcarinal region of mediastinum. The significant interval increase in size suggesting progression of malignant neoplasm. There is large infiltrate in right lower lobe which may suggest pneumonia/atelectasis due to central obstructing lesion. There are new pleural-based nodular densities in right upper lobe. There are few noncalcified nodules in the left upper lobe and left lower lobe. Follow-up PET-CT may be considered to evaluate for metastatic disease. Small right pleural effusion. Extensive coronary artery calcifications are seen. There is no evidence of thoracic aortic dissection. There is no central pulmonary artery embolism. Electronically Signed   By: Elmer Picker M.D.   On: 10/26/2021 16:09    ASSESSMENT: This is a very pleasant 66 years old white male with at least stage IIIB (T3, N2, M0) lung cancer pending tissue diagnosis and presented with large central obstructive right lower lobe lung mass with right paratracheal lymphadenopathy.  The patient also had L1 compression fracture that could be pathologic in nature as well as left lower lobe pulmonary nodule.   PLAN: I had a lengthy discussion with the patient and his caregiver today about his current condition and further investigation to confirm his diagnosis. I personally and independently reviewed the scan images and discussed the result and showed the images to the patient today. I recommended for the patient to complete the staging work-up by ordering MRI of the brain to rule out brain metastasis. I also reach out to [name] for consideration of having the patient for bronchoscopy with  EBUS.  [name] mentioned that the patient has an appointment for the procedure tomorrow but I do not see it on his visit scheduled.  Dr. Bari Mantis office will reach out to the patient for confirmation. I will see the patient back for follow-up visit in around 2 weeks for evaluation and more detailed discussion of his treatment options based on the final pathology and staging work-up. For the smoking cessation I strongly encouraged the patient to quit smoking. The patient was advised to call immediately if he has any other concerning symptoms in the interval. The patient voices understanding of current disease status and treatment options and is in agreement with the current care plan.  All questions were answered. The patient knows to call the clinic with any problems, questions or concerns. We can certainly see the patient much sooner if necessary.  Thank you so much for allowing me to participate in the care of [name]. I will continue to follow up the patient with you and assist in his care.  The total time spent in the appointment was 60 minutes.  Disclaimer: This note was dictated with voice recognition software. Similar sounding words can inadvertently be transcribed and may not be corrected upon review.   Eilleen Kempf November 15, 2021, 1:59 PM

## 2021-11-15 NOTE — Patient Instructions (Signed)
Thank you for choosing [facility] to provide your care.   Should you have questions after your visit to the [facility]), please contact this office at [phone] between 8:30 AM and 4:30 PM.  Voice mails left after 4:00 PM may not be returned until the following business day.  Calls received after 4:30 PM will be answered by an off-site Nurse Triage Line.    Prescription Refills:  Please have your pharmacy contact us directly for most prescription requests.  Contact the office directly for refills of narcotics (pain medications). Allow 48-72 hours for refills.  Appointments: Please contact the [facility] scheduling department [phone] for questions regarding [facility] appointment scheduling.  Contact the schedulers with any scheduling changes so that your appointment can be rescheduled in a timely manner.   Central Scheduling for [facility] [phone] - Call to schedule PET scan, CT scan, MRI, and Ultrasound.  To afford each patient quality time with our providers, please arrive 30 minutes before your scheduled appointment time.  If you arrive late for your appointment, you may be asked to reschedule.  We strive to give you quality time with our providers, and arriving late affects you and other patients whose appointments are after yours. If you are a no show for multiple scheduled visits, you may be dismissed from the clinic at the providers discretion.     Resources: [place] [employer] [phone] for additional information on assistance programs or assistance connecting with community support programs   [facility]  [phone]: Information regarding food stamps, Medicaid, and utility assistance [employer]'s shared-ride transportation service for eligible riders who have a disability that prevents them from riding the fixed route bus.   [place] [phone] Helps people with  Medicare understand their rights and benefits, navigate the Medicare system, and secure the quality healthcare they deserve [employer] [phone] Assists patients locate various types of support and financial assistance Cancer Care: 1-800-813-HOPE 539-015-8174) Provides financial assistance, online support groups, medication/co-pay assistance.   Transportation Assistance for appointments at [facility] or provider support staff for referral to [facility]   Again, thank you for choosing [facility] for your care.

## 2021-11-16 ENCOUNTER — Telehealth: Payer: Self-pay | Admitting: Pulmonary Disease

## 2021-11-16 ENCOUNTER — Ambulatory Visit (HOSPITAL_COMMUNITY): Payer: Medicare HMO

## 2021-11-16 ENCOUNTER — Ambulatory Visit (HOSPITAL_BASED_OUTPATIENT_CLINIC_OR_DEPARTMENT_OTHER): Payer: Medicare HMO | Admitting: Physician Assistant

## 2021-11-16 ENCOUNTER — Encounter (HOSPITAL_COMMUNITY)
Admission: RE | Disposition: A | Discharge status: Skilled Nursing Facility | Payer: Self-pay | Source: Home / Self Care | Attending: Pulmonary Disease

## 2021-11-16 ENCOUNTER — Ambulatory Visit (HOSPITAL_COMMUNITY): Payer: Medicare HMO | Admitting: Physician Assistant

## 2021-11-16 ENCOUNTER — Encounter (HOSPITAL_COMMUNITY): Payer: Self-pay | Admitting: Pulmonary Disease

## 2021-11-16 ENCOUNTER — Ambulatory Visit (HOSPITAL_COMMUNITY)
Admission: RE | Admit: 2021-11-16 | Discharge: 2021-11-16 | Disposition: A | Discharge status: Skilled Nursing Facility | Payer: Medicare HMO | Attending: Pulmonary Disease | Admitting: Pulmonary Disease

## 2021-11-16 DIAGNOSIS — E785 Hyperlipidemia, unspecified: Secondary | ICD-10-CM | POA: Insufficient documentation

## 2021-11-16 DIAGNOSIS — Z20822 Contact with and (suspected) exposure to covid-19: Secondary | ICD-10-CM | POA: Insufficient documentation

## 2021-11-16 DIAGNOSIS — J449 Chronic obstructive pulmonary disease, unspecified: Secondary | ICD-10-CM | POA: Insufficient documentation

## 2021-11-16 DIAGNOSIS — F17210 Nicotine dependence, cigarettes, uncomplicated: Secondary | ICD-10-CM

## 2021-11-16 DIAGNOSIS — C3491 Malignant neoplasm of unspecified part of right bronchus or lung: Secondary | ICD-10-CM | POA: Insufficient documentation

## 2021-11-16 DIAGNOSIS — I110 Hypertensive heart disease with heart failure: Secondary | ICD-10-CM

## 2021-11-16 DIAGNOSIS — E039 Hypothyroidism, unspecified: Secondary | ICD-10-CM | POA: Diagnosis not present

## 2021-11-16 DIAGNOSIS — F32A Depression, unspecified: Secondary | ICD-10-CM | POA: Insufficient documentation

## 2021-11-16 DIAGNOSIS — R918 Other nonspecific abnormal finding of lung field: Secondary | ICD-10-CM

## 2021-11-16 DIAGNOSIS — F419 Anxiety disorder, unspecified: Secondary | ICD-10-CM | POA: Diagnosis not present

## 2021-11-16 DIAGNOSIS — E119 Type 2 diabetes mellitus without complications: Secondary | ICD-10-CM | POA: Diagnosis not present

## 2021-11-16 DIAGNOSIS — Z7984 Long term (current) use of oral hypoglycemic drugs: Secondary | ICD-10-CM | POA: Insufficient documentation

## 2021-11-16 DIAGNOSIS — C3490 Malignant neoplasm of unspecified part of unspecified bronchus or lung: Secondary | ICD-10-CM | POA: Diagnosis not present

## 2021-11-16 DIAGNOSIS — I509 Heart failure, unspecified: Secondary | ICD-10-CM | POA: Diagnosis not present

## 2021-11-16 DIAGNOSIS — F209 Schizophrenia, unspecified: Secondary | ICD-10-CM | POA: Diagnosis not present

## 2021-11-16 DIAGNOSIS — I2510 Atherosclerotic heart disease of native coronary artery without angina pectoris: Secondary | ICD-10-CM | POA: Insufficient documentation

## 2021-11-16 DIAGNOSIS — M1990 Unspecified osteoarthritis, unspecified site: Secondary | ICD-10-CM | POA: Diagnosis not present

## 2021-11-16 DIAGNOSIS — C771 Secondary and unspecified malignant neoplasm of intrathoracic lymph nodes: Secondary | ICD-10-CM | POA: Diagnosis not present

## 2021-11-16 DIAGNOSIS — K219 Gastro-esophageal reflux disease without esophagitis: Secondary | ICD-10-CM | POA: Diagnosis not present

## 2021-11-16 DIAGNOSIS — I252 Old myocardial infarction: Secondary | ICD-10-CM | POA: Insufficient documentation

## 2021-11-16 DIAGNOSIS — C3401 Malignant neoplasm of right main bronchus: Secondary | ICD-10-CM | POA: Diagnosis not present

## 2021-11-16 HISTORY — PX: BIOPSY: SHX5522

## 2021-11-16 HISTORY — PX: BRONCHIAL BRUSHINGS: SHX5108

## 2021-11-16 HISTORY — PX: ENDOBRONCHIAL ULTRASOUND: SHX5096

## 2021-11-16 HISTORY — PX: BRONCHIAL WASHINGS: SHX5105

## 2021-11-16 HISTORY — PX: FINE NEEDLE ASPIRATION BIOPSY: CATH118315

## 2021-11-16 HISTORY — PX: HEMOSTASIS CONTROL: SHX6838

## 2021-11-16 LAB — GLUCOSE, CAPILLARY
Glucose-Capillary: 94 mg/dL (ref 70–99)
Glucose-Capillary: 103 mg/dL — ABNORMAL HIGH (ref 70–99)

## 2021-11-16 LAB — SARS CORONAVIRUS 2 BY RT PCR: SARS Coronavirus 2 by RT PCR: NEGATIVE

## 2021-11-16 SURGERY — ENDOBRONCHIAL ULTRASOUND (EBUS)
Anesthesia: General | Laterality: Bilateral

## 2021-11-16 MED ORDER — ACETAMINOPHEN 500 MG PO TABS: 1000.0000 mg | ORAL_TABLET | Freq: Once | ORAL | Status: DC

## 2021-11-16 MED ORDER — ONDANSETRON HCL 4 MG/2ML IJ SOLN
INTRAMUSCULAR | Status: DC | PRN
  Administered 2021-11-16: 4 mg via INTRAVENOUS

## 2021-11-16 MED ORDER — PROPOFOL 500 MG/50ML IV EMUL
INTRAVENOUS | Status: DC | PRN
  Administered 2021-11-16: 150 ug/kg/min via INTRAVENOUS

## 2021-11-16 MED ORDER — LACTATED RINGERS IV SOLN: INTRAVENOUS | Status: DC

## 2021-11-16 MED ORDER — FENTANYL CITRATE (PF) 100 MCG/2ML IJ SOLN: 25.0000 ug | INTRAMUSCULAR | Status: DC | PRN

## 2021-11-16 MED ORDER — PROPOFOL 10 MG/ML IV BOLUS
INTRAVENOUS | Status: DC | PRN
  Administered 2021-11-16: 100 mg via INTRAVENOUS

## 2021-11-16 MED ORDER — EPHEDRINE SULFATE-NACL 50-0.9 MG/10ML-% IV SOSY
PREFILLED_SYRINGE | INTRAVENOUS | Status: DC | PRN
  Administered 2021-11-16 (×2): 5 mg via INTRAVENOUS
  Administered 2021-11-16: 10 mg via INTRAVENOUS

## 2021-11-16 MED ORDER — DIPHENHYDRAMINE HCL 50 MG/ML IJ SOLN
INTRAMUSCULAR | Status: DC | PRN
  Administered 2021-11-16: 12.5 mg via INTRAVENOUS

## 2021-11-16 MED ORDER — ROCURONIUM BROMIDE 10 MG/ML (PF) SYRINGE
PREFILLED_SYRINGE | INTRAVENOUS | Status: DC | PRN
  Administered 2021-11-16: 40 mg via INTRAVENOUS
  Administered 2021-11-16: 10 mg via INTRAVENOUS

## 2021-11-16 MED ORDER — SUGAMMADEX SODIUM 200 MG/2ML IV SOLN
INTRAVENOUS | Status: DC | PRN
  Administered 2021-11-16: 200 mg via INTRAVENOUS

## 2021-11-16 MED ORDER — LIDOCAINE 2% (20 MG/ML) 5 ML SYRINGE
INTRAMUSCULAR | Status: DC | PRN
  Administered 2021-11-16: 40 mg via INTRAVENOUS
  Administered 2021-11-16: 60 mg via INTRAVENOUS

## 2021-11-16 MED ORDER — DEXAMETHASONE SODIUM PHOSPHATE 10 MG/ML IJ SOLN
INTRAMUSCULAR | Status: DC | PRN
  Administered 2021-11-16: 8 mg via INTRAVENOUS

## 2021-11-16 MED ORDER — PHENYLEPHRINE HCL-NACL 20-0.9 MG/250ML-% IV SOLN
INTRAVENOUS | Status: DC | PRN
  Administered 2021-11-16: 40 ug/min via INTRAVENOUS

## 2021-11-16 NOTE — Telephone Encounter (Signed)
He had complete obstruction of rt main stem Biopsies pending Will refer to rad onc

## 2021-11-16 NOTE — Anesthesia Procedure Notes (Signed)
Procedure Name: Intubation Date/Time: 11/16/2021 12:35 PM  Performed by: Milford Cage, CRNAPre-anesthesia Checklist: Patient identified, Emergency Drugs available, Suction available and Patient being monitored Patient Re-evaluated:Patient Re-evaluated prior to induction Oxygen Delivery Method: Circle system utilized Preoxygenation: Pre-oxygenation with 100% oxygen Induction Type: IV induction Ventilation: Mask ventilation without difficulty Laryngoscope Size: Miller and 2 Grade View: Grade I Tube type: Oral Tube size: 9.0 mm Number of attempts: 1 Airway Equipment and Method: Stylet Placement Confirmation: ETT inserted through vocal cords under direct vision, positive ETCO2 and breath sounds checked- equal and bilateral Secured at: 23 cm Tube secured with: Tape Dental Injury: Teeth and Oropharynx as per pre-operative assessment

## 2021-11-16 NOTE — Op Note (Signed)
  Name:  [name] MRN:  [id number] DOB:  Apr 06, 1955  PROCEDURE NOTE  Procedure(s): Flexible bronchoscopy [phone]) Brushing [phone]) of the RT main stem lesion Bronchial alveolar lavage (73710) of the RT mainstem  Endobronchial biopsy (62694) of the Rt main stem Endobronchial ultrasound (85462) Transbronchial needle aspiration (70350) of the station 7   Indications:  Hilar lung mass   Consent:  Written informed consent was obtained prior to the procedure. The risks of the procedure including coughing, bleeding and the small chance of lung puncture requiring chest tube were discussed in great detail. The benefits & alternatives including serial follow up were also discussed.  Anesthesia:  General endotracheal.  Procedure summary:  Appropriate equipment was assembled.  The patient was  identified as [name]. Interim history obtained and brought to the endoscopy  room. Safety timeout was performed. The patient was placed supine on the operating table, airway established and general anesthesia administered by Anesthesia team.   After the appropriate level of anesthesia was assured, flexible video bronchoscope was lubricated and inserted through the endotracheal tube.    Airway examination was performed. Blood noted in the carina & right main stem. large endobronchial lesion noted narrowing right main stem orifice & could not be traversed.  Endobronchial ultrasound video bronchoscope was then lubricated and inserted through the endotracheal tube. Surveillance of the mediastinal and and bilateral hilar lymph node stations was performed.  Pathologically enlarged lymph nodes were noted at station 7  Endobronchial ultrasound guided transbronchial needle aspiration of station 7 (passes x 4), was performed, after which EBUS bronchoscope was withdrawn.  Flexible video bronchoscope was used again to perform random endobronchial mucosal biopsies, BAL & brushings of Rt main stem lesion   After ensuring hemostasis with cold saline , the bronchoscope was withdrawn.  The patient was extubated in endoscopy room and transferred to PACU. Post-procedure chest x-ray was ordered.  Specimens sent: Bronchial alveolar lavage specimen of the RT main stem for  microbiology and cytology. Brushings & endobronchial biopsies TBNA of station 7  Complications:  No immediate complications were noted.  Hemodynamic parameters and oxygenation remained stable throughout the procedure.  Estimated blood loss:  Less then 5 mL.   Kara Mead MD. Shade Flood. [facility] Pager [phone] If no response call [phone]   11/16/2021 2:19 PM

## 2021-11-16 NOTE — Anesthesia Preprocedure Evaluation (Addendum)
Anesthesia Evaluation  Patient identified by MRN, date of birth, ID band Patient awake    Reviewed: Allergy & Precautions, H&P , NPO status , Patient's Chart, lab work & pertinent test results  Airway Mallampati: I  TM Distance: >3 FB Neck ROM: Full    Dental no notable dental hx. (+) Edentulous Upper, Edentulous Lower, Dental Advisory Given   Pulmonary asthma , COPD, Current Smoker and Patient abstained from smoking.,    Pulmonary exam normal breath sounds clear to auscultation       Cardiovascular hypertension, Pt. on medications and Pt. on home beta blockers + CAD, + Past MI, + Cardiac Stents and +CHF   Rhythm:Regular Rate:Normal     Neuro/Psych Anxiety Depression Bipolar Disorder negative neurological ROS     GI/Hepatic Neg liver ROS, GERD  Medicated,  Endo/Other  diabetes, Type 2, Oral Hypoglycemic AgentsHypothyroidism   Renal/GU negative Renal ROS  negative genitourinary   Musculoskeletal  (+) Arthritis , Osteoarthritis,    Abdominal   Peds  Hematology negative hematology ROS (+)   Anesthesia Other Findings   Reproductive/Obstetrics negative OB ROS                            Anesthesia Physical Anesthesia Plan  ASA: 3  Anesthesia Plan: General   Post-op Pain Management: Tylenol PO (pre-op)*   Induction: Intravenous  PONV Risk Score and Plan: 2 and Ondansetron, Dexamethasone, Propofol infusion and TIVA  Airway Management Planned: Oral ETT  Additional Equipment:   Intra-op Plan:   Post-operative Plan: Extubation in OR  Informed Consent: I have reviewed the patients History and Physical, chart, labs and discussed the procedure including the risks, benefits and alternatives for the proposed anesthesia with the patient or authorized representative who has indicated his/her understanding and acceptance.     Dental advisory given  Plan Discussed with: CRNA  Anesthesia  Plan Comments:         Anesthesia Quick Evaluation

## 2021-11-16 NOTE — Anesthesia Postprocedure Evaluation (Signed)
Anesthesia Post Note  Patient: [name]  Procedure(s) Performed: ENDOBRONCHIAL ULTRASOUND (Bilateral) BRONCHIAL BRUSHINGS BIOPSY FINE NEEDLE ASPIRATION BIOPSY BRONCHIAL WASHINGS HEMOSTASIS CONTROL     Patient location during evaluation: Endoscopy Anesthesia Type: General Level of consciousness: awake and alert Pain management: pain level controlled Vital Signs Assessment: post-procedure vital signs reviewed and stable Respiratory status: spontaneous breathing, nonlabored ventilation and respiratory function stable Cardiovascular status: blood pressure returned to baseline and stable Postop Assessment: no apparent nausea or vomiting Anesthetic complications: no   No notable events documented.  Last Vitals:  Vitals:   11/16/21 1418 11/16/21 1440  BP: (!) 93/58 (!) 97/58  Pulse:  87  Resp: 19   Temp:    SpO2: 95% 96%    Last Pain:  Vitals:   11/16/21 1418  TempSrc:   PainSc: 0-No pain                 Logen Fowle,W. EDMOND

## 2021-11-16 NOTE — Transfer of Care (Signed)
Immediate Anesthesia Transfer of Care Note  Patient: [name]  Procedure(s) Performed: ENDOBRONCHIAL ULTRASOUND (Bilateral) BRONCHIAL BRUSHINGS BIOPSY FINE NEEDLE ASPIRATION BIOPSY BRONCHIAL WASHINGS  Patient Location: Endoscopy Unit  Anesthesia Type:General  Level of Consciousness: awake  Airway & Oxygen Therapy: Patient Spontanous Breathing and Patient connected to face mask oxygen  Post-op Assessment: Report given to RN and Post -op Vital signs reviewed and stable  Post vital signs: Reviewed and unstable. Treating BP  Last Vitals:  Vitals Value Taken Time  BP 84/50   Temp    Pulse 84   Resp 18   SpO2 98     Last Pain:  Vitals:   11/16/21 1344  TempSrc:   PainSc: 0-No pain         Complications: No notable events documented.

## 2021-11-16 NOTE — Interval H&P Note (Signed)
History and Physical Interval Note:  11/16/2021 12:22 PM  [name]  has presented today for surgery, with the diagnosis of lung mass.  The various methods of treatment have been discussed with the patient and family. After consideration of risks, benefits and other options for treatment, the patient has consented to  Procedure(s): ENDOBRONCHIAL ULTRASOUND (Bilateral) as a surgical intervention.  The patient's history has been reviewed, patient examined, no change in status, stable for surgery.  I have reviewed the patient's chart and labs.  Questions were answered to the patient's satisfaction.     Leanna Sato [name]

## 2021-11-17 ENCOUNTER — Ambulatory Visit: Payer: Medicare HMO | Admitting: Internal Medicine

## 2021-11-17 ENCOUNTER — Encounter (HOSPITAL_COMMUNITY): Payer: Self-pay | Admitting: Pulmonary Disease

## 2021-11-17 LAB — ACID FAST SMEAR (AFB, MYCOBACTERIA): Acid Fast Smear: NEGATIVE

## 2021-11-17 NOTE — Progress Notes (Signed)
Thoracic Location of Tumor / Histology:   Patient initially presented in February 2023 with complaints of 2 weeks of hemoptysis.  He was lost to follow-up.  He presented to the hospital 10/26/2021 with complaints of hemoptysis and shortness of breath.  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  There was age indeterminate L1 compression fracture and if suspicious for pathologic fracture this will be consistent with a stage IV disease.  There was a slight pleural thickening and nodularity in the right upper lobe.  There was 0.8 cm left lower lobe nodule stable from February 2023 and below the PET resolution.  CT Chest 10/26/2021: large mass lesion extending from the right hilum into the subcarinal region of the mediastinum measuring approximately 7.4 x 5.1 cm.  There was significant interval increase in the size of the right hilar mass with extrinsic compression of the right main bronchus and its branches in the right hilum and mediastinum.  There was interval appearance of large infiltrate in the right lower lobe suggesting atelectasis/pneumonia.  There was a new 1.4 cm pleural-based density in the medial right apex and a 0.8 cm pleural-based density in the right upper lobe.  Small right pleural effusion was seen and there was a 1.3 cm nodular density in the left upper lobe with no significant changes.  There was also a 0.9 cm nodule in the left lower lobe with no significant change.  CT Chest 04/05/2021: 3.3 x 3.4 cm rounded mass-like consolidation involving the right hilar/infrahilar region suspicious for malignancy.  Occlusion of the bronchus intermedius with partial consolidative changes of the right lower lobe which represent postobstructive atelectasis or pneumonia.  There was also a 0.9 cm nodule in the left lower lobe.   Biopsies  of    Tobacco/Marijuana/Snuff/ETOH use: Current Smoker, working to quit.   Past/Anticipated interventions by cardiothoracic surgery, if any:    Past/Anticipated interventions by medical oncology, if any:  Dr. Julien Nordmann 11/15/2021 -I recommended for the patient to complete the staging work-up by ordering MRI of the brain to rule out brain metastasis. -I also reach out to [name] for consideration of having the patient for bronchoscopy with EBUS.  -I will see the patient back for follow-up visit in around 2 weeks for evaluation and more detailed discussion of his treatment options based on the final pathology and staging work-up.   Signs/Symptoms Weight changes, if any: He reports his weight was roughly 250 about a month ago. Respiratory complaints, if any: He reports intermittent SOB and wheezing.  He uses a inhaler. Hemoptysis, if any: He reports occasional non productive cough.  Denies hemoptysis recently. Pain issues, if any: He reports occasional chest pressure and pain.   SAFETY ISSUES: Prior radiation? No Pacemaker/ICD? No  Possible current pregnancy? n/a Is the patient on methotrexate?   Current Complaints / other details:   -Resides at [facility]

## 2021-11-18 ENCOUNTER — Other Ambulatory Visit: Payer: Self-pay

## 2021-11-18 ENCOUNTER — Ambulatory Visit
Admission: RE | Admit: 2021-11-18 | Discharge: 2021-11-18 | Disposition: A | Discharge status: Home / Self Care | Payer: Medicare HMO | Source: Ambulatory Visit | Attending: Radiation Oncology | Admitting: Radiation Oncology

## 2021-11-18 ENCOUNTER — Encounter: Payer: Self-pay | Admitting: Radiation Oncology

## 2021-11-18 ENCOUNTER — Telehealth: Payer: Self-pay | Admitting: Radiation Oncology

## 2021-11-18 VITALS — BP 98/60 | HR 92 | Temp 99.3°F | Resp 16 | Ht 72.0 in | Wt 212.0 lb

## 2021-11-18 DIAGNOSIS — J449 Chronic obstructive pulmonary disease, unspecified: Secondary | ICD-10-CM | POA: Insufficient documentation

## 2021-11-18 DIAGNOSIS — I2510 Atherosclerotic heart disease of native coronary artery without angina pectoris: Secondary | ICD-10-CM | POA: Insufficient documentation

## 2021-11-18 DIAGNOSIS — C3401 Malignant neoplasm of right main bronchus: Secondary | ICD-10-CM | POA: Diagnosis not present

## 2021-11-18 DIAGNOSIS — I110 Hypertensive heart disease with heart failure: Secondary | ICD-10-CM | POA: Insufficient documentation

## 2021-11-18 DIAGNOSIS — F17210 Nicotine dependence, cigarettes, uncomplicated: Secondary | ICD-10-CM | POA: Diagnosis not present

## 2021-11-18 DIAGNOSIS — Z79899 Other long term (current) drug therapy: Secondary | ICD-10-CM | POA: Insufficient documentation

## 2021-11-18 DIAGNOSIS — C3481 Malignant neoplasm of overlapping sites of right bronchus and lung: Secondary | ICD-10-CM | POA: Insufficient documentation

## 2021-11-18 DIAGNOSIS — E039 Hypothyroidism, unspecified: Secondary | ICD-10-CM | POA: Diagnosis not present

## 2021-11-18 DIAGNOSIS — J90 Pleural effusion, not elsewhere classified: Secondary | ICD-10-CM | POA: Diagnosis not present

## 2021-11-18 DIAGNOSIS — Z7982 Long term (current) use of aspirin: Secondary | ICD-10-CM | POA: Insufficient documentation

## 2021-11-18 DIAGNOSIS — I959 Hypotension, unspecified: Secondary | ICD-10-CM | POA: Diagnosis not present

## 2021-11-18 DIAGNOSIS — K219 Gastro-esophageal reflux disease without esophagitis: Secondary | ICD-10-CM | POA: Insufficient documentation

## 2021-11-18 DIAGNOSIS — I509 Heart failure, unspecified: Secondary | ICD-10-CM | POA: Insufficient documentation

## 2021-11-18 DIAGNOSIS — I700 Atherosclerosis of aorta: Secondary | ICD-10-CM | POA: Diagnosis not present

## 2021-11-18 DIAGNOSIS — Z7984 Long term (current) use of oral hypoglycemic drugs: Secondary | ICD-10-CM | POA: Diagnosis not present

## 2021-11-18 DIAGNOSIS — Z86718 Personal history of other venous thrombosis and embolism: Secondary | ICD-10-CM | POA: Diagnosis not present

## 2021-11-18 DIAGNOSIS — G8929 Other chronic pain: Secondary | ICD-10-CM | POA: Insufficient documentation

## 2021-11-18 DIAGNOSIS — F17218 Nicotine dependence, cigarettes, with other nicotine-induced disorders: Secondary | ICD-10-CM | POA: Diagnosis not present

## 2021-11-18 LAB — CYTOLOGY - NON PAP

## 2021-11-18 LAB — SURGICAL PATHOLOGY

## 2021-11-18 MED ORDER — LEVOFLOXACIN 750 MG PO TABS: 750.0000 mg | ORAL_TABLET | Freq: Every day | ORAL | 0 refills | Status: DC

## 2021-11-18 NOTE — Progress Notes (Addendum)
Radiation Oncology         [phone] ________________________________  Name: [name]        MRN: [id number]  Date of Service: 11/18/2021 DOB: April 25, 1955  CC:Ho, Maudry Mayhew, MD  Rigoberto Noel, MD     REFERRING PHYSICIAN: Rigoberto Noel, MD   DIAGNOSIS: The encounter diagnosis was Malignant neoplasm of overlapping sites of right lung [facility]).   HISTORY OF PRESENT ILLNESS: [name] is a 66 y.o. male seen at the request of [name] for new diagnosis of lung cancer.  The patient had symptoms of hemoptysis and cough earlier this year.   He was treated with antibiotics but lost to follow-up until he presented for follow-up imaging with a CT chest on 10/26/2021 that showed a large central mass in the right hilum measuring up to 7.4 cm extending into the subcarinal region of the mediastinum with interval increase in size suggesting progression of what could be malignant neoplasm.  There was a large infiltrate in the right lower lobe suggesting pneumonia/atelectasis and pleural-based nodular densities in the right upper lobe.  He did undergo PET on 11/01/2021 which showed a centrally obstructing right lower lobe mass with right paratracheal adenopathy consistent with T3N1 disease, and L1 1 compression fracture was noted but could not be clarified if this was pathologic or age related.  Slight septal thickening and nodularity in the right upper lobe was noted and an 8 mm left lower lobe nodule was stable in comparison to prior imaging, postobstructive collapse/collapse consolidation in the right middle and right lower lobes were noted and small right pleural effusion with evidence of stigmata of coronary and aortic atherosclerotic disease. He underwent bronchoscopy with [name] on 11/16/2021, pathology was pending at the time of discussion was pending but appears to be squamous cell carcinoma.  Seen today to discuss treatment with radiotherapy and meets with Dr. Julien Nordmann next Tuesday.    PREVIOUS  RADIATION THERAPY: No   PAST MEDICAL HISTORY:  Past Medical History:  Diagnosis Date   Anginal pain ([place])    Anxiety    Arthritis    "right hip; herniated L4-5" (11/05/2015)   Asthma    Bipolar 1 disorder (HCC)    CHF (congestive heart failure) (HCC)    Chronic bronchitis (HCC)    Chronic lower back pain    Chronic pain disorder    Community acquired pneumonia 10/30/2016   COPD (chronic obstructive pulmonary disease) ([place])    Coronary artery disease    Coronary atherosclerosis 12/05/2008   Qualifier: Diagnosis of  By: Jaramillo, [place], HX OF 12/05/2008   Qualifier: Diagnosis of  By: Jaramillo, [place], MAJOR 12/05/2008   Qualifier: Diagnosis of  By: Sidney Ace     Diabetes mellitus type 2 with complications ([place]) 5/69/7948   Diverticulitis large intestine    DVT (deep venous thrombosis) ([place]) 1991   "left calf; after 1st knee scope"   EROSIVE GASTRITIS 12/05/2008   Qualifier: Diagnosis of  By: Sidney Ace     Fungal esophagitis    GERD 12/05/2008   Qualifier: Diagnosis of  By: Sidney Ace     GERD (gastroesophageal reflux disease)    Heart attack ([place]) 2010   BMS x 2 RCA   High cholesterol    Hyperlipidemia    Hypertension    Hypothyroidism    MRSA (methicillin resistant staph aureus) culture positive    Myocardial infarction ([place])  MYOCARDIAL INFARCTION, HX OF 12/05/2008   Qualifier: Diagnosis of  By: Sidney Ace     Obesity 12/05/2008   Qualifier: Diagnosis of  By: Sidney Ace     Pneumonia    "several times"   PULMONARY NODULE, LEFT LOWER LOBE 12/05/2008   Qualifier: Diagnosis of  By: Sidney Ace     S/P angioplasty with stent 11/05/15 PCI DES to mLAD  11/06/2015   Schizo affective schizophrenia ([place]) 2002   SOB (shortness of breath) 08/22/2016   Status post cholecystectomy 09/25/2014       PAST SURGICAL HISTORY: Past Surgical History:  Procedure Laterality Date   APPENDECTOMY     BIOPSY  11/16/2021    Procedure: BIOPSY;  Surgeon: Rigoberto Noel, MD;  Location: [facility];  Service: Cardiopulmonary;;   BRONCHIAL BRUSHINGS  11/16/2021   Procedure: BRONCHIAL BRUSHINGS;  Surgeon: Rigoberto Noel, MD;  Location: [facility];  Service: Cardiopulmonary;;   BRONCHIAL WASHINGS  11/16/2021   Procedure: BRONCHIAL WASHINGS;  Surgeon: Rigoberto Noel, MD;  Location: [facility];  Service: Cardiopulmonary;;   CARDIAC CATHETERIZATION N/A 11/05/2015   Procedure: Left Heart Cath and Coronary Angiography;  Surgeon: Nelva Bush, MD;  Location: [facility];  Service: Cardiovascular;  Laterality: N/A;   CARDIAC CATHETERIZATION N/A 11/05/2015   Procedure: Coronary Stent Intervention;  Surgeon: Nelva Bush, MD;  Location: [facility];  Service: Cardiovascular;  Laterality: N/A;   CARDIAC CATHETERIZATION N/A 11/05/2015   Procedure: Intravascular Pressure Wire/FFR Study;  Surgeon: Nelva Bush, MD;  Location: [facility];  Service: Cardiovascular;  Laterality: N/A;   CORONARY ANGIOPLASTY WITH STENT PLACEMENT  2010   90% RCA s/p BMS x 2, 50% LAD, 30% CFX, EF 45-50%   CORONARY ANGIOPLASTY WITH STENT PLACEMENT  11/05/2015   "1 today; makes a total of 4 stents" (11/05/2015)   ENDOBRONCHIAL ULTRASOUND Bilateral 11/16/2021   Procedure: ENDOBRONCHIAL ULTRASOUND;  Surgeon: Rigoberto Noel, MD;  Location: [facility];  Service: Cardiopulmonary;  Laterality: Bilateral;   FINE NEEDLE ASPIRATION BIOPSY  11/16/2021   Procedure: FINE NEEDLE ASPIRATION BIOPSY;  Surgeon: Rigoberto Noel, MD;  Location: [facility];  Service: Cardiopulmonary;;   HEMOSTASIS CONTROL  11/16/2021   Procedure: HEMOSTASIS CONTROL;  Surgeon: Rigoberto Noel, MD;  Location: [facility];  Service: Cardiopulmonary;;   INGUINAL HERNIA REPAIR Right    KNEE ARTHROSCOPY Left X 3   LAPAROSCOPIC CHOLECYSTECTOMY     TONSILLECTOMY AND ADENOIDECTOMY     UPPER GI ENDOSCOPY  05/24/2016   UGI ENDO INCLUDE ESOPHAGUS, STOMACH & DUODENUM &/OR  JEJUNUM; DX W/WO COLLECTION SPECIMEN BY [facility] OR [place]; SURGEON :ALBERT San Morelle, MD LOCATION; [facility]     FAMILY HISTORY:  Family History  Problem Relation Age of Onset   Heart Problems Mother        1   Heart disease Father        64     SOCIAL HISTORY:  reports that he has been smoking cigarettes. He has a 42.00 pack-year smoking history. He has never used smokeless tobacco. He reports that he does not drink alcohol and does not use drugs. The patient has lived in assisted facilities for several years, but prior to this had lived independantly. 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 His eldest daughter Audry Pili died suddenly following the birth of his first grandchild. He reports his other daughter Rojelio Brenner he has estrangement from for  many years but hopes to reconnect with her and asks me to contact her to share what's going on with him.    ALLERGIES: Rocephin [ceftriaxone], Zofran [ondansetron hcl], Ampicillin, Lipitor [atorvastatin], Prochlorperazine edisylate, Toradol [ketorolac tromethamine], and Tramadol   MEDICATIONS:  Current Outpatient Medications  Medication Sig Dispense Refill   acetaminophen (TYLENOL) 325 MG tablet Take 650 mg by mouth every 6 (six) hours as needed (pain.).     albuterol (PROVENTIL) (2.5 MG/3ML) 0.083% nebulizer solution Take 2.5 mg by nebulization every 6 (six) hours as needed for wheezing or shortness of breath.     albuterol (VENTOLIN HFA) 108 (90 Base) MCG/ACT inhaler Inhale 2 puffs into the lungs every 6 (six) hours as needed for wheezing or shortness of breath.     allopurinol (ZYLOPRIM) 100 MG tablet Take 100 mg by mouth daily. (0900)     aspirin EC 81 MG tablet Take 1 tablet (81 mg total) by mouth daily. Swallow whole. 90 tablet 3   cyclobenzaprine (FLEXERIL) 10 MG tablet Take 10 mg by mouth 3 (three) times daily. (0900, 1300 & 2100)     ferrous sulfate 325 (65  FE) MG tablet Take 325 mg by mouth in the morning and at bedtime. (0800 & 1600)     Fluticasone-Umeclidin-Vilant (TRELEGY ELLIPTA) 200-62.5-25 MCG/ACT AEPB Inhale 1 puff into the lungs daily. 14 each 0   furosemide (LASIX) 20 MG tablet Take 20 mg by mouth in the morning. (0900)     gabapentin (NEURONTIN) 400 MG capsule Take 400 mg by mouth 3 (three) times daily. (0900, 1300 & 2100)     Glucagon, rDNA, (GLUCAGON EMERGENCY) 1 MG KIT Inject 1 mg as directed as needed (hypoglycemia).     HUMALOG KWIKPEN 100 UNIT/ML KwikPen Inject 0-10 Units into the skin in the morning, at noon, in the evening, and at bedtime. Per Sliding Scale: 0-150=0 units 151-200=2 units 201-250=4 units 251-350=8 units 351-400=10 units     ipratropium-albuterol (DUONEB) 0.5-2.5 (3) MG/3ML SOLN Inhale 3 mLs into the lungs every 4 (four) hours as needed (wheezing/COPD).     levofloxacin (LEVAQUIN) 750 MG tablet Take 1 tablet (750 mg total) by mouth daily. 10 tablet 0   levothyroxine (SYNTHROID) 75 MCG tablet Take 75 mcg by mouth daily before breakfast.     LORazepam (ATIVAN) 2 MG tablet Take 2 mg by mouth every 12 (twelve) hours. (0800 & 2000)     metFORMIN (GLUCOPHAGE) 500 MG tablet Take 500 mg by mouth 2 (two) times daily with a meal. (0900 & 1700)     metoprolol succinate (TOPROL-XL) 25 MG 24 hr tablet Take 25 mg by mouth in the morning. (0900)     omeprazole (PRILOSEC) 20 MG capsule Take 20 mg by mouth in the morning.     Oxycodone HCl 10 MG TABS Take 10 mg by mouth every 6 (six) hours as needed (pain).     polyethylene glycol (MIRALAX / GLYCOLAX) packet Take 17 g by mouth daily as needed for mild constipation.      pravastatin (PRAVACHOL) 80 MG tablet Take 80 mg by mouth at bedtime. (2000)     QUEtiapine (SEROQUEL) 100 MG tablet Take 100 mg by mouth at bedtime. (2000)     saccharomyces boulardii (FLORASTOR) 250 MG capsule Take 250 mg by mouth 2 (two) times daily. (0900 & 2100)     temazepam (RESTORIL) 30 MG capsule Take 30  mg by mouth at bedtime. (2000)     therapeutic multivitamin-minerals (THERAGRAN-M) tablet Take 1 tablet  by mouth in the morning. (0900)     venlafaxine (EFFEXOR) 75 MG tablet Take 75 mg by mouth in the morning. (0900)     No current facility-administered medications for this encounter.     REVIEW OF SYSTEMS: On review of systems, the patient reports that he is doing okay overall. He feels like his bipolar symptoms are well controlled at this time. He denies any additional episodes of hemoptysis which had happened around the time of his scans earlier this year. He has lost at least 10 pounds, possibly more in the last year, but is trying to gain weight back. He struggles to tolerate protein drinks for meal replacement. He has shortness of breath with exertion, and once a week on average a productive cough. No fevers have been noted to his knowledge recently. No other complaints are otherwise verbalized.      PHYSICAL EXAM:  Wt Readings from Last 3 Encounters:  11/18/21 212 lb (96.2 kg)  11/16/21 195 lb 8.8 oz (88.7 kg)  11/03/21 195 lb 9.6 oz (88.7 kg)   Temp Readings from Last 3 Encounters:  11/18/21 99.3 F (37.4 C) (Oral)  11/16/21 97.8 F (36.6 C)  11/15/21 98.4 F (36.9 C) (Oral)   BP Readings from Last 3 Encounters:  11/18/21 98/60  11/16/21 (!) 97/58  11/15/21 95/69   Pulse Readings from Last 3 Encounters:  11/18/21 92  11/16/21 87  11/15/21 95   Pain Assessment Pain Score: 6  Pain Loc: Back (Lower)/10  In general this is a tired appearing caucasian male in no acute distress. He's alert and oriented x4 and appropriate throughout the examination. Cardiopulmonary assessment is negative for acute distress and he exhibits normal effort.     ECOG = 1  0 - Asymptomatic (Fully active, able to carry on all predisease activities without restriction)  1 - Symptomatic but completely ambulatory (Restricted in physically strenuous activity but ambulatory and able to carry out  work of a light or sedentary nature. For example, light housework, office work)  2 - Symptomatic, <50% in bed during the day (Ambulatory and capable of all self care but unable to carry out any work activities. Up and about more than 50% of waking hours)  3 - Symptomatic, >50% in bed, but not bedbound (Capable of only limited self-care, confined to bed or chair 50% or more of waking hours)  4 - Bedbound (Completely disabled. Cannot carry on any self-care. Totally confined to bed or chair)  5 - Death   Eustace Pen MM, Creech RH, Tormey DC, et al. [phone]). "Toxicity and response criteria of the [facility]". [place] Oncol. 5 (6): 649-55    LABORATORY DATA:  Lab Results  Component Value Date   WBC 10.4 11/15/2021   HGB 9.1 (L) 11/15/2021   HCT 27.6 (L) 11/15/2021   MCV 76.5 (L) 11/15/2021   PLT 232 11/15/2021   Lab Results  Component Value Date   NA 134 (L) 11/15/2021   K 3.9 11/15/2021   CL 97 (L) 11/15/2021   CO2 29 11/15/2021   Lab Results  Component Value Date   ALT 9 11/15/2021   AST 14 (L) 11/15/2021   ALKPHOS 114 11/15/2021   BILITOT 0.4 11/15/2021      RADIOGRAPHY: NM PET Image Initial (PI) Skull Base To Thigh (F-18 FDG)  Result Date: 11/01/2021 CLINICAL DATA:  Initial treatment strategy for lung nodule. EXAM: NUCLEAR MEDICINE PET SKULL BASE TO THIGH TECHNIQUE: 11.3 mCi F-18 FDG was  injected intravenously. Full-ring PET imaging was performed from the skull base to thigh after the radiotracer. CT data was obtained and used for attenuation correction and anatomic localization. Fasting blood glucose: 103 mg/dl COMPARISON:  CT chest 10/26/2021, 04/05/2021, 04/13/2020. FINDINGS: Mediastinal blood pool activity: SUV max 3.0 Liver activity: SUV max NA NECK: No abnormal hypermetabolism. Incidental CT findings: None. CHEST: Hypermetabolic masslike consolidation in the right lower lobe with a centrally obstructing mass measuring approximately 3.8 x 6.1 cm  (4/93), SUV max 31.0. Mildly hypermetabolic right paratracheal lymph nodes with index mid right paratracheal lymph node measuring 9 mm (4/72), SUV max 3.9. Incidental CT findings: Atherosclerotic calcification of the aorta and coronary arteries. Heart is at the upper limits of normal in size. No pericardial effusion. Small right pleural effusion. Central right lower lobe mass, as described above, severely narrows the right upper lobe bronchus and completely obstructs the bronchus intermedius. Associated collapse/consolidation in the right middle and right lower lobes. Slight septal thickening and nodularity in the aerated right upper lobe. 8 mm anterior left lower lobe nodule (7/42), stable from 04/05/2021 and too small for PET resolution. ABDOMEN/PELVIS: No abnormal hypermetabolism in the liver, adrenal glands, spleen or pancreas. No hypermetabolic lymph nodes. Incidental CT findings: Liver is unremarkable. Cholecystectomy. Adrenal glands, kidneys, spleen, pancreas, stomach and bowel are grossly unremarkable. Atherosclerotic calcification of the aorta. SKELETON: Focal hypermetabolism in the soft tissues just inferolateral to the left inferior pubic ramus does not have a CT correlate. L1 compression deformity with associated hypermetabolism. No additional abnormal hypermetabolism. Incidental CT findings: Degenerative changes in the spine. IMPRESSION: 1. Centrally obstructing right lower lobe mass with right paratracheal adenopathy, compatible with at least T3 N1 M0 or stage IIIA primary bronchogenic carcinoma. 2. Age-indeterminate L1 compression fracture. If pathologic in nature, findings would then be consistent with stage IV disease. 3. Slight septal thickening and nodularity in the right upper lobe. Difficult to exclude lymphangitic carcinomatosis. 4. 8 mm left lower lobe nodule, stable from 04/05/2021 and too small for PET resolution. Recommend attention on follow-up. 5. Postobstructive collapse/consolidation in  the right middle and right lower lobes. 6. Small right pleural effusion. 7. Aortic atherosclerosis (ICD10-I70.0). Coronary artery calcification. Electronically Signed   By: Lorin Picket M.D.   On: 11/01/2021 12:04   CT Chest W Contrast  Result Date: 10/26/2021 CLINICAL DATA:  Hemoptysis, right lung mass, shortness of breath EXAM: CT CHEST WITH CONTRAST TECHNIQUE: Multidetector CT imaging of the chest was performed during intravenous contrast administration. RADIATION DOSE REDUCTION: This exam was performed according to the departmental dose-optimization program which includes automated exposure control, adjustment of the mA and/or kV according to patient size and/or use of iterative reconstruction technique. CONTRAST:  100mL OMNIPAQUE IOHEXOL 300 MG/ML  SOLN COMPARISON:  None Available. FINDINGS: Cardiovascular: Coronary artery calcifications are seen. There is homogeneous enhancement in thoracic aorta. There are no intraluminal filling defects in central pulmonary artery branches. Evaluation of small peripheral branches in right lung is less than optimal due to extensive infiltrates in right lower lobe. There is extrinsic compression of right lower lobe pulmonary artery in the right hilum. Mediastinum/Nodes: There is a large mass lesion extending from the right hilum into subcarinal region of mediastinum measuring a proximally 7.4 x 5.1 cm. There is significant interval increase in size of the right hilar mass. There is extrinsic compression of right main bronchus and its branches in the right hilum and mediastinum. Lungs/Pleura: There is significant interval increase in size of right hilar mass measuring up to  7.4 cm. The mass is extending into the subcarinal region of the mediastinum. There is interval appearance of large infiltrate in right lower lobe suggesting atelectasis/pneumonia. In image 30 of series 5, there is a new 1.4 cm pleural-based density in the medial right apex. In image 51 of series 5,  there is new 8 mm pleural-based density in right upper lobe. Small right pleural effusion is seen. There is no pneumothorax in image 74 of series 5, there is 1.3 cm nodular density in left upper lobe with no significant change. In image 102 of series 5, there is a 9 mm nodule in left lower lobe with no significant change. New small patchy infiltrates are seen in right upper lobe, right middle lobe and left upper lobe. There is no pneumothorax. Upper Abdomen: No acute findings are seen. Musculoskeletal: No acute findings are seen. IMPRESSION: There is a large central mass lesion in right hilum measuring up to 7.4 cm in size extending into the subcarinal region of mediastinum. The significant interval increase in size suggesting progression of malignant neoplasm. There is large infiltrate in right lower lobe which may suggest pneumonia/atelectasis due to central obstructing lesion. There are new pleural-based nodular densities in right upper lobe. There are few noncalcified nodules in the left upper lobe and left lower lobe. Follow-up PET-CT may be considered to evaluate for metastatic disease. Small right pleural effusion. Extensive coronary artery calcifications are seen. There is no evidence of thoracic aortic dissection. There is no central pulmonary artery embolism. Electronically Signed   By: Elmer Picker M.D.   On: 10/26/2021 16:09       IMPRESSION/PLAN: 1. 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 Dr. Lisbeth Renshaw discusses the pathology findings and reviews the nature of likely locally advanced lung cancer. Dr. Lisbeth Renshaw recommends a course of chemoRT. We discussed the risks, benefits, short, and long term effects of radiotherapy, as well as the curative intent, and the patient is interested in proceeding. Dr. Lisbeth Renshaw discusses the delivery and logistics of radiotherapy and anticipates a course of 6 1/2 weeks of radiotherapy to the right lower lobe target and regional  nodes. Written consent is obtained and placed in the chart, a copy was provided to the patient. He will simulate today and we anticipate starting treatment on 11/29/21. He also asked that I contact his daughter Rojelio Brenner to share his diagnosis with her.  2. Elevated temperature with risks of postobstructive pneumonia. I will restart Levaquin due to risks of pneumonia. 3. Hypotension. I asked his caregiver to reach out to his medical provider regarding this so his medications may be reviewed if changes need to be made in his Beta Blocker therapy.  In a visit lasting 60 minutes, greater than 50% of the time was spent face to face discussing the patient's condition, in preparation for the discussion, and coordinating the patient's care.   The above documentation reflects my direct findings during this shared patient visit. Please see the separate note by Dr. Lisbeth Renshaw on this date for the remainder of the patient's plan of care.    Carola Rhine, [facility]   **Disclaimer: This note was dictated with voice recognition software. Similar sounding words can inadvertently be transcribed and this note may contain transcription errors which may not have been corrected upon publication of note.**

## 2021-11-18 NOTE — Telephone Encounter (Signed)
I called the patient's daughter at the patient's request to share with her his medical situation and diagnosis of lung cancer. We discussed his plans for MRI to rule out metastatic disease, and plans if MRI is negative for chemoRT to his right lung and nodes. She requested his contact information but asked that we not share her information with him as they continue to have an estranged relationship. She asked that I send this information to her email which was sent as a secure encrypted message.

## 2021-11-19 ENCOUNTER — Telehealth: Payer: Self-pay | Admitting: Internal Medicine

## 2021-11-19 LAB — FUNGUS STAIN

## 2021-11-19 LAB — CULTURE, RESPIRATORY W GRAM STAIN
Culture: NO GROWTH
Gram Stain: NONE SEEN

## 2021-11-19 NOTE — Telephone Encounter (Signed)
Contacted patient to scheduled appointments. Patient is aware of appointments that are scheduled.   

## 2021-11-21 LAB — ANAEROBIC CULTURE W GRAM STAIN: Gram Stain: NONE SEEN

## 2021-11-22 ENCOUNTER — Inpatient Hospital Stay (HOSPITAL_BASED_OUTPATIENT_CLINIC_OR_DEPARTMENT_OTHER): Payer: Medicare HMO | Admitting: Internal Medicine

## 2021-11-22 ENCOUNTER — Inpatient Hospital Stay: Payer: Medicare HMO

## 2021-11-22 ENCOUNTER — Encounter: Payer: Self-pay | Admitting: Internal Medicine

## 2021-11-22 ENCOUNTER — Other Ambulatory Visit: Payer: Self-pay

## 2021-11-22 VITALS — BP 113/70 | HR 97 | Temp 99.9°F | Resp 16

## 2021-11-22 DIAGNOSIS — Z9049 Acquired absence of other specified parts of digestive tract: Secondary | ICD-10-CM | POA: Insufficient documentation

## 2021-11-22 DIAGNOSIS — R110 Nausea: Secondary | ICD-10-CM | POA: Insufficient documentation

## 2021-11-22 DIAGNOSIS — Z880 Allergy status to penicillin: Secondary | ICD-10-CM | POA: Insufficient documentation

## 2021-11-22 DIAGNOSIS — C3490 Malignant neoplasm of unspecified part of unspecified bronchus or lung: Secondary | ICD-10-CM

## 2021-11-22 DIAGNOSIS — Z86718 Personal history of other venous thrombosis and embolism: Secondary | ICD-10-CM | POA: Insufficient documentation

## 2021-11-22 DIAGNOSIS — Z888 Allergy status to other drugs, medicaments and biological substances status: Secondary | ICD-10-CM | POA: Insufficient documentation

## 2021-11-22 DIAGNOSIS — Z5111 Encounter for antineoplastic chemotherapy: Secondary | ICD-10-CM | POA: Insufficient documentation

## 2021-11-22 DIAGNOSIS — Z79899 Other long term (current) drug therapy: Secondary | ICD-10-CM | POA: Insufficient documentation

## 2021-11-22 DIAGNOSIS — M549 Dorsalgia, unspecified: Secondary | ICD-10-CM | POA: Insufficient documentation

## 2021-11-22 DIAGNOSIS — R5383 Other fatigue: Secondary | ICD-10-CM | POA: Insufficient documentation

## 2021-11-22 DIAGNOSIS — R0609 Other forms of dyspnea: Secondary | ICD-10-CM | POA: Insufficient documentation

## 2021-11-22 DIAGNOSIS — C3431 Malignant neoplasm of lower lobe, right bronchus or lung: Secondary | ICD-10-CM

## 2021-11-22 DIAGNOSIS — R634 Abnormal weight loss: Secondary | ICD-10-CM | POA: Insufficient documentation

## 2021-11-22 DIAGNOSIS — R911 Solitary pulmonary nodule: Secondary | ICD-10-CM | POA: Insufficient documentation

## 2021-11-22 DIAGNOSIS — Z885 Allergy status to narcotic agent status: Secondary | ICD-10-CM | POA: Insufficient documentation

## 2021-11-22 DIAGNOSIS — C3411 Malignant neoplasm of upper lobe, right bronchus or lung: Secondary | ICD-10-CM | POA: Insufficient documentation

## 2021-11-22 DIAGNOSIS — I2510 Atherosclerotic heart disease of native coronary artery without angina pectoris: Secondary | ICD-10-CM | POA: Insufficient documentation

## 2021-11-22 DIAGNOSIS — R590 Localized enlarged lymph nodes: Secondary | ICD-10-CM | POA: Insufficient documentation

## 2021-11-22 DIAGNOSIS — R042 Hemoptysis: Secondary | ICD-10-CM | POA: Insufficient documentation

## 2021-11-22 DIAGNOSIS — M4856XA Collapsed vertebra, not elsewhere classified, lumbar region, initial encounter for fracture: Secondary | ICD-10-CM | POA: Insufficient documentation

## 2021-11-22 DIAGNOSIS — I700 Atherosclerosis of aorta: Secondary | ICD-10-CM | POA: Insufficient documentation

## 2021-11-22 DIAGNOSIS — D509 Iron deficiency anemia, unspecified: Secondary | ICD-10-CM | POA: Insufficient documentation

## 2021-11-22 DIAGNOSIS — Z79890 Hormone replacement therapy (postmenopausal): Secondary | ICD-10-CM | POA: Insufficient documentation

## 2021-11-22 DIAGNOSIS — Z923 Personal history of irradiation: Secondary | ICD-10-CM | POA: Insufficient documentation

## 2021-11-22 DIAGNOSIS — C3481 Malignant neoplasm of overlapping sites of right bronchus and lung: Secondary | ICD-10-CM | POA: Insufficient documentation

## 2021-11-22 DIAGNOSIS — Z881 Allergy status to other antibiotic agents status: Secondary | ICD-10-CM | POA: Insufficient documentation

## 2021-11-22 DIAGNOSIS — I252 Old myocardial infarction: Secondary | ICD-10-CM | POA: Insufficient documentation

## 2021-11-22 DIAGNOSIS — J90 Pleural effusion, not elsewhere classified: Secondary | ICD-10-CM | POA: Insufficient documentation

## 2021-11-22 LAB — CBC WITH DIFFERENTIAL (CANCER CENTER ONLY)
Abs Immature Granulocytes: 0.04 10*3/uL (ref 0.00–0.07)
Basophils Absolute: 0 10*3/uL (ref 0.0–0.1)
Basophils Relative: 0 %
Eosinophils Absolute: 0.2 10*3/uL (ref 0.0–0.5)
Eosinophils Relative: 2 %
HCT: 28 % — ABNORMAL LOW (ref 39.0–52.0)
Hemoglobin: 9 g/dL — ABNORMAL LOW (ref 13.0–17.0)
Immature Granulocytes: 0 %
Lymphocytes Relative: 24 %
Lymphs Abs: 2.6 10*3/uL (ref 0.7–4.0)
MCH: 25.1 pg — ABNORMAL LOW (ref 26.0–34.0)
MCHC: 32.1 g/dL (ref 30.0–36.0)
MCV: 78 fL — ABNORMAL LOW (ref 80.0–100.0)
Monocytes Absolute: 0.7 10*3/uL (ref 0.1–1.0)
Monocytes Relative: 6 %
Neutro Abs: 7.6 10*3/uL (ref 1.7–7.7)
Neutrophils Relative %: 68 %
Platelet Count: 212 10*3/uL (ref 150–400)
RBC: 3.59 MIL/uL — ABNORMAL LOW (ref 4.22–5.81)
RDW: 16.5 % — ABNORMAL HIGH (ref 11.5–15.5)
WBC Count: 11.2 10*3/uL — ABNORMAL HIGH (ref 4.0–10.5)
nRBC: 0 % (ref 0.0–0.2)

## 2021-11-22 LAB — CMP (CANCER CENTER ONLY)
ALT: 6 U/L (ref 0–44)
AST: 12 U/L — ABNORMAL LOW (ref 15–41)
Albumin: 3.5 g/dL (ref 3.5–5.0)
Alkaline Phosphatase: 104 U/L (ref 38–126)
Anion gap: 9 (ref 5–15)
BUN: 8 mg/dL (ref 8–23)
CO2: 28 mmol/L (ref 22–32)
Calcium: 8.6 mg/dL — ABNORMAL LOW (ref 8.9–10.3)
Chloride: 100 mmol/L (ref 98–111)
Creatinine: 0.81 mg/dL (ref 0.61–1.24)
GFR, Estimated: >60 mL/min (ref >60)
Glucose, Bld: 116 mg/dL — ABNORMAL HIGH (ref 70–99)
Potassium: 4.1 mmol/L (ref 3.5–5.1)
Sodium: 137 mmol/L (ref 135–145)
Total Bilirubin: 0.4 mg/dL (ref 0.3–1.2)
Total Protein: 6.6 g/dL (ref 6.5–8.1)

## 2021-11-22 MED ORDER — PROCHLORPERAZINE MALEATE 10 MG PO TABS: 10.0000 mg | ORAL_TABLET | Freq: Four times a day (QID) | ORAL | 0 refills | Status: DC | PRN

## 2021-11-22 NOTE — Progress Notes (Signed)
START ON PATHWAY REGIMEN - Non-Small Cell Lung     A cycle is every 7 days, concurrent with RT:     Paclitaxel      Carboplatin   **Always confirm dose/schedule in your pharmacy ordering system**  Patient Characteristics: Preoperative or Nonsurgical Candidate (Clinical Staging), Stage III - Nonsurgical Candidate (Nonsquamous and Squamous), PS = 0, 1 Therapeutic Status: Preoperative or Nonsurgical Candidate (Clinical Staging) AJCC T Category: cT3 AJCC N Category: cN2 AJCC M Category: cM0 AJCC 8 Stage Grouping: IIIB ECOG Performance Status: 1 Intent of Therapy: Curative Intent, Discussed with Patient

## 2021-11-22 NOTE — Progress Notes (Signed)
[facility] [phone]   [phone]  OFFICE PROGRESS NOTE  [name], MD [address] [place] [address]  DIAGNOSIS: Stage IIIB (T3, N2, M0) non-small cell lung cancer, squamous cell carcinoma presented with large central obstructive right lower lobe lung mass with right paratracheal lymphadenopathy.  The patient also had L1 compression fracture that could be pathologic in nature as well as left lower lobe pulmonary nodule.  PRIOR THERAPY: None  CURRENT THERAPY: Concurrent chemoradiation with weekly carboplatin for AUC of 2 and paclitaxel 45 Mg/M2.  First dose November 29, 2021.  INTERVAL HISTORY: [name] 66 y.o. male returns to the clinic today for returns to the clinic today for follow-up visit accompanied by his caregiver.  The patient is feeling fine today with no concerning complaints except for the pain on the right side of the chest and back.  He denied having any shortness of breath but has mild cough with no hemoptysis.  He has no nausea, vomiting, diarrhea or constipation.  He has no headache or visual changes.  He denied having any recent weight loss or night sweats.  He was seen recently by [name] and he had bronchoscopy with EBUS and the final pathology was consistent with squamous cell carcinoma.  The patient is here today for evaluation and discussion of his treatment options based on the new results.  He has MRI of the brain scheduled to be done in few days.  MEDICAL HISTORY: Past Medical History:  Diagnosis Date   Anginal pain ([place])    Anxiety    Arthritis    "right hip; herniated L4-5" (11/05/2015)   Asthma    Bipolar 1 disorder (HCC)    CHF (congestive heart failure) (HCC)    Chronic bronchitis (HCC)    Chronic lower back pain    Chronic pain disorder    Community acquired pneumonia 10/30/2016   COPD (chronic obstructive pulmonary disease) ([place])    Coronary artery disease    Coronary  atherosclerosis 12/05/2008   Qualifier: Diagnosis of  By: Jaramillo, [place], HX OF 12/05/2008   Qualifier: Diagnosis of  By: Jaramillo, [place], MAJOR 12/05/2008   Qualifier: Diagnosis of  By: Sidney Ace     Diabetes mellitus type 2 with complications ([place]) 06/14/5359   Diverticulitis large intestine    DVT (deep venous thrombosis) ([place]) 1991   "left calf; after 1st knee scope"   EROSIVE GASTRITIS 12/05/2008   Qualifier: Diagnosis of  By: Sidney Ace     Fungal esophagitis    GERD 12/05/2008   Qualifier: Diagnosis of  By: Sidney Ace     GERD (gastroesophageal reflux disease)    Heart attack ([place]) 2010   BMS x 2 RCA   High cholesterol    Hyperlipidemia    Hypertension    Hypothyroidism    MRSA (methicillin resistant staph aureus) culture positive    Myocardial infarction ([place])    MYOCARDIAL INFARCTION, HX OF 12/05/2008   Qualifier: Diagnosis of  By: Sidney Ace     Obesity 12/05/2008   Qualifier: Diagnosis of  By: Sidney Ace     Pneumonia    "several times"   PULMONARY NODULE, LEFT LOWER LOBE 12/05/2008   Qualifier: Diagnosis of  By: Sidney Ace     S/P angioplasty with stent 11/05/15 PCI DES to mLAD  11/06/2015   Schizo affective schizophrenia ([place]) 2002  SOB (shortness of breath) 08/22/2016   Status post cholecystectomy 09/25/2014    ALLERGIES:  is allergic to rocephin [ceftriaxone], zofran [ondansetron hcl], ampicillin, lipitor [atorvastatin], prochlorperazine edisylate, toradol [ketorolac tromethamine], and tramadol.  MEDICATIONS:  Current Outpatient Medications  Medication Sig Dispense Refill   acetaminophen (TYLENOL) 325 MG tablet Take 650 mg by mouth every 6 (six) hours as needed (pain.).     albuterol (PROVENTIL) (2.5 MG/3ML) 0.083% nebulizer solution Take 2.5 mg by nebulization every 6 (six) hours as needed for wheezing or shortness of breath.     albuterol (VENTOLIN HFA) 108 (90 Base) MCG/ACT inhaler Inhale  2 puffs into the lungs every 6 (six) hours as needed for wheezing or shortness of breath.     allopurinol (ZYLOPRIM) 100 MG tablet Take 100 mg by mouth daily. (0900)     aspirin EC 81 MG tablet Take 1 tablet (81 mg total) by mouth daily. Swallow whole. 90 tablet 3   cyclobenzaprine (FLEXERIL) 10 MG tablet Take 10 mg by mouth 3 (three) times daily. (0900, 1300 & 2100)     ferrous sulfate 325 (65 FE) MG tablet Take 325 mg by mouth in the morning and at bedtime. (0800 & 1600)     Fluticasone-Umeclidin-Vilant (TRELEGY ELLIPTA) 200-62.5-25 MCG/ACT AEPB Inhale 1 puff into the lungs daily. 14 each 0   furosemide (LASIX) 20 MG tablet Take 20 mg by mouth in the morning. (0900)     gabapentin (NEURONTIN) 400 MG capsule Take 400 mg by mouth 3 (three) times daily. (0900, 1300 & 2100)     Glucagon, rDNA, (GLUCAGON EMERGENCY) 1 MG KIT Inject 1 mg as directed as needed (hypoglycemia).     HUMALOG KWIKPEN 100 UNIT/ML KwikPen Inject 0-10 Units into the skin in the morning, at noon, in the evening, and at bedtime. Per Sliding Scale: 0-150=0 units 151-200=2 units 201-250=4 units 251-350=8 units 351-400=10 units     ipratropium-albuterol (DUONEB) 0.5-2.5 (3) MG/3ML SOLN Inhale 3 mLs into the lungs every 4 (four) hours as needed (wheezing/COPD).     levofloxacin (LEVAQUIN) 750 MG tablet Take 1 tablet (750 mg total) by mouth daily. 10 tablet 0   levothyroxine (SYNTHROID) 75 MCG tablet Take 75 mcg by mouth daily before breakfast.     LORazepam (ATIVAN) 2 MG tablet Take 2 mg by mouth every 12 (twelve) hours. (0800 & 2000)     metFORMIN (GLUCOPHAGE) 500 MG tablet Take 500 mg by mouth 2 (two) times daily with a meal. (0900 & 1700)     metoprolol succinate (TOPROL-XL) 25 MG 24 hr tablet Take 25 mg by mouth in the morning. (0900)     omeprazole (PRILOSEC) 20 MG capsule Take 20 mg by mouth in the morning.     Oxycodone HCl 10 MG TABS Take 10 mg by mouth every 6 (six) hours as needed (pain).     polyethylene glycol  (MIRALAX / GLYCOLAX) packet Take 17 g by mouth daily as needed for mild constipation.      pravastatin (PRAVACHOL) 80 MG tablet Take 80 mg by mouth at bedtime. (2000)     QUEtiapine (SEROQUEL) 100 MG tablet Take 100 mg by mouth at bedtime. (2000)     saccharomyces boulardii (FLORASTOR) 250 MG capsule Take 250 mg by mouth 2 (two) times daily. (0900 & 2100)     temazepam (RESTORIL) 30 MG capsule Take 30 mg by mouth at bedtime. (2000)     therapeutic multivitamin-minerals (THERAGRAN-M) tablet Take 1 tablet by mouth in the morning. (0900)  venlafaxine (EFFEXOR) 75 MG tablet Take 75 mg by mouth in the morning. (0900)     No current facility-administered medications for this visit.    SURGICAL HISTORY:  Past Surgical History:  Procedure Laterality Date   APPENDECTOMY     BIOPSY  11/16/2021   Procedure: BIOPSY;  Surgeon: Rigoberto Noel, MD;  Location: [facility];  Service: Cardiopulmonary;;   BRONCHIAL BRUSHINGS  11/16/2021   Procedure: BRONCHIAL BRUSHINGS;  Surgeon: Rigoberto Noel, MD;  Location: [facility];  Service: Cardiopulmonary;;   BRONCHIAL WASHINGS  11/16/2021   Procedure: BRONCHIAL WASHINGS;  Surgeon: Rigoberto Noel, MD;  Location: [facility];  Service: Cardiopulmonary;;   CARDIAC CATHETERIZATION N/A 11/05/2015   Procedure: Left Heart Cath and Coronary Angiography;  Surgeon: Nelva Bush, MD;  Location: [facility];  Service: Cardiovascular;  Laterality: N/A;   CARDIAC CATHETERIZATION N/A 11/05/2015   Procedure: Coronary Stent Intervention;  Surgeon: Nelva Bush, MD;  Location: [facility];  Service: Cardiovascular;  Laterality: N/A;   CARDIAC CATHETERIZATION N/A 11/05/2015   Procedure: Intravascular Pressure Wire/FFR Study;  Surgeon: Nelva Bush, MD;  Location: [facility];  Service: Cardiovascular;  Laterality: N/A;   CORONARY ANGIOPLASTY WITH STENT PLACEMENT  2010   90% RCA s/p BMS x 2, 50% LAD, 30% CFX, EF 45-50%   CORONARY ANGIOPLASTY WITH STENT  PLACEMENT  11/05/2015   "1 today; makes a total of 4 stents" (11/05/2015)   ENDOBRONCHIAL ULTRASOUND Bilateral 11/16/2021   Procedure: ENDOBRONCHIAL ULTRASOUND;  Surgeon: Rigoberto Noel, MD;  Location: [facility];  Service: Cardiopulmonary;  Laterality: Bilateral;   FINE NEEDLE ASPIRATION BIOPSY  11/16/2021   Procedure: FINE NEEDLE ASPIRATION BIOPSY;  Surgeon: Rigoberto Noel, MD;  [facility];  Service: Cardiopulmonary;;   HEMOSTASIS CONTROL  11/16/2021   Procedure: HEMOSTASIS CONTROL;  Surgeon: Rigoberto Noel, MD;  [facility];  Service: Cardiopulmonary;;   INGUINAL HERNIA REPAIR Right    KNEE ARTHROSCOPY Left X 3   LAPAROSCOPIC CHOLECYSTECTOMY     TONSILLECTOMY AND ADENOIDECTOMY     UPPER GI ENDOSCOPY  05/24/2016   UGI ENDO INCLUDE ESOPHAGUS, STOMACH & DUODENUM &/OR JEJUNUM; DX W/WO COLLECTION SPECIMEN BY BRUSH OR [place]; SURGEON :ALBERT San Morelle, MD LOCATION; [facility]    REVIEW OF SYSTEMS:  Constitutional: positive for fatigue Eyes: negative Ears, nose, mouth, throat, and face: negative Respiratory: positive for dyspnea on exertion and pleurisy/chest pain Cardiovascular: negative Gastrointestinal: negative Genitourinary:negative Integument/breast: negative Hematologic/lymphatic: negative Musculoskeletal:positive for back pain Neurological: negative Behavioral/Psych: negative Endocrine: negative Allergic/Immunologic: negative   PHYSICAL EXAMINATION: General appearance: alert, cooperative, fatigued, and no distress Head: Normocephalic, without obvious abnormality, atraumatic Neck: no adenopathy, no JVD, supple, symmetrical, trachea midline, and thyroid not enlarged, symmetric, no tenderness/mass/nodules Lymph nodes: Cervical, supraclavicular, and axillary nodes normal. Resp: clear to auscultation bilaterally Back: symmetric, no curvature. ROM normal. No CVA tenderness. Cardio: regular rate and rhythm, S1, S2 normal, no murmur, click, rub or  gallop GI: soft, non-tender; bowel sounds normal; no masses,  no organomegaly Extremities: extremities normal, atraumatic, no cyanosis or edema Neurologic: Alert and oriented X 3, normal strength and tone. Normal symmetric reflexes. Normal coordination and gait  ECOG PERFORMANCE STATUS: 1 - Symptomatic but completely ambulatory  Blood pressure 113/70, pulse 97, temperature 99.9 F (37.7 C), temperature source Oral, resp. rate 16, SpO2 98 %.  LABORATORY DATA: Lab Results  Component Value Date   WBC 11.2 (H) 11/22/2021   HGB 9.0 (L) 11/22/2021   HCT 28.0 (  L) 11/22/2021   MCV 78.0 (L) 11/22/2021   PLT 212 11/22/2021      Chemistry      Component Value Date/Time   NA 137 11/22/2021 1319   NA 138 01/21/2017 0000   K 4.1 11/22/2021 1319   CL 100 11/22/2021 1319   CO2 28 11/22/2021 1319   BUN 8 11/22/2021 1319   BUN 16 01/21/2017 0000   CREATININE 0.81 11/22/2021 1319   GLU 103 10/25/2016 0000      Component Value Date/Time   CALCIUM 8.6 (L) 11/22/2021 1319   ALKPHOS 104 11/22/2021 1319   AST 12 (L) 11/22/2021 1319   ALT 6 11/22/2021 1319   BILITOT 0.4 11/22/2021 1319       RADIOGRAPHIC STUDIES: NM PET Image Initial (PI) Skull Base To Thigh (F-18 FDG)  Result Date: 11/01/2021 CLINICAL DATA:  Initial treatment strategy for lung nodule. EXAM: NUCLEAR MEDICINE PET SKULL BASE TO THIGH TECHNIQUE: 11.3 mCi F-18 FDG was injected intravenously. Full-ring PET imaging was performed from the skull base to thigh after the radiotracer. CT data was obtained and used for attenuation correction and anatomic localization. Fasting blood glucose: 103 mg/dl COMPARISON:  CT chest 10/26/2021, 04/05/2021, 04/13/2020. FINDINGS: Mediastinal blood pool activity: SUV max 3.0 Liver activity: SUV max NA NECK: No abnormal hypermetabolism. Incidental CT findings: None. CHEST: Hypermetabolic masslike consolidation in the right lower lobe with a centrally obstructing mass measuring approximately 3.8 x 6.1 cm  (4/93), SUV max 31.0. Mildly hypermetabolic right paratracheal lymph nodes with index mid right paratracheal lymph node measuring 9 mm (4/72), SUV max 3.9. Incidental CT findings: Atherosclerotic calcification of the aorta and coronary arteries. Heart is at the upper limits of normal in size. No pericardial effusion. Small right pleural effusion. Central right lower lobe mass, as described above, severely narrows the right upper lobe bronchus and completely obstructs the bronchus intermedius. Associated collapse/consolidation in the right middle and right lower lobes. Slight septal thickening and nodularity in the aerated right upper lobe. 8 mm anterior left lower lobe nodule (7/42), stable from 04/05/2021 and too small for PET resolution. ABDOMEN/PELVIS: No abnormal hypermetabolism in the liver, adrenal glands, spleen or pancreas. No hypermetabolic lymph nodes. Incidental CT findings: Liver is unremarkable. Cholecystectomy. Adrenal glands, kidneys, spleen, pancreas, stomach and bowel are grossly unremarkable. Atherosclerotic calcification of the aorta. SKELETON: Focal hypermetabolism in the soft tissues just inferolateral to the left inferior pubic ramus does not have a CT correlate. L1 compression deformity with associated hypermetabolism. No additional abnormal hypermetabolism. Incidental CT findings: Degenerative changes in the spine. IMPRESSION: 1. Centrally obstructing right lower lobe mass with right paratracheal adenopathy, compatible with at least T3 N1 M0 or stage IIIA primary bronchogenic carcinoma. 2. Age-indeterminate L1 compression fracture. If pathologic in nature, findings would then be consistent with stage IV disease. 3. Slight septal thickening and nodularity in the right upper lobe. Difficult to exclude lymphangitic carcinomatosis. 4. 8 mm left lower lobe nodule, stable from 04/05/2021 and too small for PET resolution. Recommend attention on follow-up. 5. Postobstructive collapse/consolidation in  the right middle and right lower lobes. 6. Small right pleural effusion. 7. Aortic atherosclerosis (ICD10-I70.0). Coronary artery calcification. Electronically Signed   By: Lorin Picket M.D.   On: 11/01/2021 12:04   CT Chest W Contrast  Result Date: 10/26/2021 CLINICAL DATA:  Hemoptysis, right lung mass, shortness of breath EXAM: CT CHEST WITH CONTRAST TECHNIQUE: Multidetector CT imaging of the chest was performed during intravenous contrast administration. RADIATION DOSE REDUCTION: This exam was performed  according to the departmental dose-optimization program which includes automated exposure control, adjustment of the mA and/or kV according to patient size and/or use of iterative reconstruction technique. CONTRAST:  36mL OMNIPAQUE IOHEXOL 300 MG/ML  SOLN COMPARISON:  None Available. FINDINGS: Cardiovascular: Coronary artery calcifications are seen. There is homogeneous enhancement in thoracic aorta. There are no intraluminal filling defects in central pulmonary artery branches. Evaluation of small peripheral branches in right lung is less than optimal due to extensive infiltrates in right lower lobe. There is extrinsic compression of right lower lobe pulmonary artery in the right hilum. Mediastinum/Nodes: There is a large mass lesion extending from the right hilum into subcarinal region of mediastinum measuring a proximally 7.4 x 5.1 cm. There is significant interval increase in size of the right hilar mass. There is extrinsic compression of right main bronchus and its branches in the right hilum and mediastinum. Lungs/Pleura: There is significant interval increase in size of right hilar mass measuring up to 7.4 cm. The mass is extending into the subcarinal region of the mediastinum. There is interval appearance of large infiltrate in right lower lobe suggesting atelectasis/pneumonia. In image 30 of series 5, there is a new 1.4 cm pleural-based density in the medial right apex. In image 51 of series 5,  there is new 8 mm pleural-based density in right upper lobe. Small right pleural effusion is seen. There is no pneumothorax in image 74 of series 5, there is 1.3 cm nodular density in left upper lobe with no significant change. In image 102 of series 5, there is a 9 mm nodule in left lower lobe with no significant change. New small patchy infiltrates are seen in right upper lobe, right middle lobe and left upper lobe. There is no pneumothorax. Upper Abdomen: No acute findings are seen. Musculoskeletal: No acute findings are seen. IMPRESSION: There is a large central mass lesion in right hilum measuring up to 7.4 cm in size extending into the subcarinal region of mediastinum. The significant interval increase in size suggesting progression of malignant neoplasm. There is large infiltrate in right lower lobe which may suggest pneumonia/atelectasis due to central obstructing lesion. There are new pleural-based nodular densities in right upper lobe. There are few noncalcified nodules in the left upper lobe and left lower lobe. Follow-up PET-CT may be considered to evaluate for metastatic disease. Small right pleural effusion. Extensive coronary artery calcifications are seen. There is no evidence of thoracic aortic dissection. There is no central pulmonary artery embolism. Electronically Signed   By: Elmer Picker M.D.   On: 10/26/2021 16:09    ASSESSMENT AND PLAN: This is a very pleasant 66 years old white male recently diagnosed with a stage IIIb (T3, N2, M0) non-small cell cancer, squamous cell carcinoma presented with large right lower lobe lung mass in addition to mediastinal lymphadenopathy diagnosed in September 2023. His PD-L1 expression is still pending. I recommended for the patient to complete the staging work-up by having MRI of the brain done as scheduled on November 25, 2021. I discussed with the patient his treatment options and recommended for him a course of concurrent chemoradiation with  weekly carboplatin for AUC of 2 and paclitaxel 45 Mg/M2 for 6-7 weeks followed by consolidation immunotherapy if the patient has no evidence for disease progression after the induction treatment and if there is no evidence of metastatic disease on the MRI of the brain. I discussed with the patient the adverse effect of this treatment including but not limited to alopecia, myelosuppression, nausea  and vomiting, peripheral neuropathy, liver or renal dysfunction. I will refer the patient to radiation oncology for evaluation and discussion of the radiotherapy option. He will have a chemotherapy education class before the first dose of his treatment. I will see the patient back for follow-up visit in around 2 weeks for evaluation and management of any adverse effect of his treatment. He was advised to call immediately if he has any other concerning symptoms in the interval. The patient voices understanding of current disease status and treatment options and is in agreement with the current care plan.  All questions were answered. The patient knows to call the clinic with any problems, questions or concerns. We can certainly see the patient much sooner if necessary.  The total time spent in the appointment was 40 minutes.  Disclaimer: This note was dictated with voice recognition software. Similar sounding words can inadvertently be transcribed and may not be corrected upon review.

## 2021-11-23 ENCOUNTER — Other Ambulatory Visit: Payer: Self-pay

## 2021-11-23 NOTE — Progress Notes (Signed)
The pharmacy team has substituted IV diphenhydramine for IV cetirizine as a premedication. Patient will be monitored for hypersensitivity reaction and adverse reactions to IV cetirizine. Thanks.   Kennith Center, Pharm.D., CPP 11/23/2021@3 :35 PM

## 2021-11-24 DIAGNOSIS — I10 Essential (primary) hypertension: Secondary | ICD-10-CM | POA: Diagnosis not present

## 2021-11-24 DIAGNOSIS — G4709 Other insomnia: Secondary | ICD-10-CM | POA: Diagnosis not present

## 2021-11-24 DIAGNOSIS — D649 Anemia, unspecified: Secondary | ICD-10-CM | POA: Diagnosis not present

## 2021-11-25 ENCOUNTER — Other Ambulatory Visit: Payer: Self-pay | Admitting: *Deleted

## 2021-11-25 ENCOUNTER — Telehealth: Payer: Self-pay | Admitting: Physician Assistant

## 2021-11-25 ENCOUNTER — Ambulatory Visit (HOSPITAL_COMMUNITY): Admission: RE | Admit: 2021-11-25 | Payer: Medicare HMO | Source: Ambulatory Visit

## 2021-11-25 ENCOUNTER — Ambulatory Visit (HOSPITAL_COMMUNITY)
Admission: RE | Admit: 2021-11-25 | Discharge: 2021-11-25 | Disposition: A | Discharge status: Home / Self Care | Payer: Medicare HMO | Source: Ambulatory Visit | Attending: Internal Medicine | Admitting: Internal Medicine

## 2021-11-25 DIAGNOSIS — C3490 Malignant neoplasm of unspecified part of unspecified bronchus or lung: Secondary | ICD-10-CM | POA: Diagnosis not present

## 2021-11-25 DIAGNOSIS — I2510 Atherosclerotic heart disease of native coronary artery without angina pectoris: Secondary | ICD-10-CM | POA: Diagnosis not present

## 2021-11-25 DIAGNOSIS — F3132 Bipolar disorder, current episode depressed, moderate: Secondary | ICD-10-CM | POA: Diagnosis not present

## 2021-11-25 DIAGNOSIS — I6782 Cerebral ischemia: Secondary | ICD-10-CM | POA: Diagnosis not present

## 2021-11-25 MED ORDER — GADOPICLENOL 0.5 MMOL/ML IV SOLN
9.5000 mL | Freq: Once | INTRAVENOUS | Status: AC | PRN
  Administered 2021-11-25: 9.5 mL via INTRAVENOUS

## 2021-11-25 NOTE — Telephone Encounter (Signed)
Scheduled per 10/02 los, called and spoke with patient's transportation. Patient will be notified.

## 2021-11-25 NOTE — Progress Notes (Signed)
The proposed treatment discussed in conference is for discussion purpose only and is not a binding recommendation.  The patients have not been physically examined, or presented with their treatment options.  Therefore, final treatment plans cannot be decided.  

## 2021-11-26 ENCOUNTER — Ambulatory Visit (HOSPITAL_BASED_OUTPATIENT_CLINIC_OR_DEPARTMENT_OTHER): Payer: Medicare HMO

## 2021-11-26 DIAGNOSIS — I2510 Atherosclerotic heart disease of native coronary artery without angina pectoris: Secondary | ICD-10-CM

## 2021-11-26 DIAGNOSIS — C3490 Malignant neoplasm of unspecified part of unspecified bronchus or lung: Secondary | ICD-10-CM | POA: Diagnosis not present

## 2021-11-26 MED ORDER — PERFLUTREN LIPID MICROSPHERE
3.0000 mL | INTRAVENOUS | Status: AC | PRN
  Administered 2021-11-26: 3 mL via INTRAVENOUS

## 2021-11-28 DIAGNOSIS — F17218 Nicotine dependence, cigarettes, with other nicotine-induced disorders: Secondary | ICD-10-CM | POA: Diagnosis not present

## 2021-11-28 DIAGNOSIS — C3481 Malignant neoplasm of overlapping sites of right bronchus and lung: Secondary | ICD-10-CM | POA: Insufficient documentation

## 2021-11-29 ENCOUNTER — Other Ambulatory Visit: Payer: Self-pay

## 2021-11-29 ENCOUNTER — Ambulatory Visit
Admission: RE | Admit: 2021-11-29 | Discharge: 2021-11-29 | Disposition: A | Discharge status: Home / Self Care | Payer: Medicare HMO | Source: Ambulatory Visit | Attending: Radiation Oncology | Admitting: Radiation Oncology

## 2021-11-29 ENCOUNTER — Inpatient Hospital Stay: Payer: Medicare HMO

## 2021-11-29 ENCOUNTER — Other Ambulatory Visit: Payer: Self-pay | Admitting: Physician Assistant

## 2021-11-29 DIAGNOSIS — F17218 Nicotine dependence, cigarettes, with other nicotine-induced disorders: Secondary | ICD-10-CM | POA: Diagnosis not present

## 2021-11-29 DIAGNOSIS — Z510 Encounter for antineoplastic radiation therapy: Secondary | ICD-10-CM | POA: Diagnosis not present

## 2021-11-29 DIAGNOSIS — C3431 Malignant neoplasm of lower lobe, right bronchus or lung: Secondary | ICD-10-CM

## 2021-11-29 DIAGNOSIS — C3481 Malignant neoplasm of overlapping sites of right bronchus and lung: Secondary | ICD-10-CM | POA: Diagnosis not present

## 2021-11-29 LAB — CBC WITH DIFFERENTIAL (CANCER CENTER ONLY)
Abs Immature Granulocytes: 0.03 10*3/uL (ref 0.00–0.07)
Basophils Absolute: 0 10*3/uL (ref 0.0–0.1)
Basophils Relative: 0 %
Eosinophils Absolute: 0.1 10*3/uL (ref 0.0–0.5)
Eosinophils Relative: 1 %
HCT: 25.4 % — ABNORMAL LOW (ref 39.0–52.0)
Hemoglobin: 8.2 g/dL — ABNORMAL LOW (ref 13.0–17.0)
Immature Granulocytes: 0 %
Lymphocytes Relative: 25 %
Lymphs Abs: 2.4 10*3/uL (ref 0.7–4.0)
MCH: 25.1 pg — ABNORMAL LOW (ref 26.0–34.0)
MCHC: 32.3 g/dL (ref 30.0–36.0)
MCV: 77.7 fL — ABNORMAL LOW (ref 80.0–100.0)
Monocytes Absolute: 0.6 10*3/uL (ref 0.1–1.0)
Monocytes Relative: 6 %
Neutro Abs: 6.5 10*3/uL (ref 1.7–7.7)
Neutrophils Relative %: 68 %
Platelet Count: 158 10*3/uL (ref 150–400)
RBC: 3.27 MIL/uL — ABNORMAL LOW (ref 4.22–5.81)
RDW: 17 % — ABNORMAL HIGH (ref 11.5–15.5)
WBC Count: 9.7 10*3/uL (ref 4.0–10.5)
nRBC: 0 % (ref 0.0–0.2)

## 2021-11-29 LAB — CMP (CANCER CENTER ONLY)
ALT: 6 U/L (ref 0–44)
AST: 11 U/L — ABNORMAL LOW (ref 15–41)
Albumin: 3.3 g/dL — ABNORMAL LOW (ref 3.5–5.0)
Alkaline Phosphatase: 100 U/L (ref 38–126)
Anion gap: 9 (ref 5–15)
BUN: 9 mg/dL (ref 8–23)
CO2: 27 mmol/L (ref 22–32)
Calcium: 7.4 mg/dL — ABNORMAL LOW (ref 8.9–10.3)
Chloride: 100 mmol/L (ref 98–111)
Creatinine: 0.82 mg/dL (ref 0.61–1.24)
GFR, Estimated: >60 mL/min (ref >60)
Glucose, Bld: 87 mg/dL (ref 70–99)
Potassium: 4.1 mmol/L (ref 3.5–5.1)
Sodium: 136 mmol/L (ref 135–145)
Total Bilirubin: 0.3 mg/dL (ref 0.3–1.2)
Total Protein: 6.7 g/dL (ref 6.5–8.1)

## 2021-11-29 LAB — RAD ONC ARIA SESSION SUMMARY
Course Elapsed Days: 0
Plan Fractions Treated to Date: 1
Plan Prescribed Dose Per Fraction: 2 Gy
Plan Total Fractions Prescribed: 30
Plan Total Prescribed Dose: 60 Gy
Reference Point Dosage Given to Date: 2 Gy
Reference Point Session Dosage Given: 2 Gy
Session Number: 1

## 2021-11-29 MED ORDER — PROCHLORPERAZINE MALEATE 10 MG PO TABS: 10.0000 mg | ORAL_TABLET | Freq: Four times a day (QID) | ORAL | 2 refills | Status: DC | PRN

## 2021-11-29 MED FILL — Dexamethasone Sodium Phosphate Inj 100 MG/10ML: INTRAMUSCULAR | Qty: 1 | Status: AC

## 2021-11-30 ENCOUNTER — Other Ambulatory Visit: Payer: Self-pay

## 2021-11-30 ENCOUNTER — Other Ambulatory Visit: Payer: Medicare HMO

## 2021-11-30 ENCOUNTER — Telehealth: Payer: Self-pay | Admitting: Medical Oncology

## 2021-11-30 ENCOUNTER — Ambulatory Visit
Admission: RE | Admit: 2021-11-30 | Discharge: 2021-11-30 | Disposition: A | Discharge status: Home / Self Care | Payer: Medicare HMO | Source: Ambulatory Visit | Attending: Radiation Oncology | Admitting: Radiation Oncology

## 2021-11-30 ENCOUNTER — Inpatient Hospital Stay: Payer: Medicare HMO

## 2021-11-30 ENCOUNTER — Ambulatory Visit: Payer: Medicare HMO | Admitting: Internal Medicine

## 2021-11-30 VITALS — BP 104/76 | HR 71 | Temp 98.5°F | Resp 18 | Ht 72.0 in | Wt 178.5 lb

## 2021-11-30 DIAGNOSIS — Z510 Encounter for antineoplastic radiation therapy: Secondary | ICD-10-CM | POA: Diagnosis not present

## 2021-11-30 DIAGNOSIS — C3481 Malignant neoplasm of overlapping sites of right bronchus and lung: Secondary | ICD-10-CM | POA: Diagnosis not present

## 2021-11-30 DIAGNOSIS — C3431 Malignant neoplasm of lower lobe, right bronchus or lung: Secondary | ICD-10-CM

## 2021-11-30 DIAGNOSIS — F17218 Nicotine dependence, cigarettes, with other nicotine-induced disorders: Secondary | ICD-10-CM | POA: Diagnosis not present

## 2021-11-30 LAB — RAD ONC ARIA SESSION SUMMARY
Course Elapsed Days: 1
Plan Fractions Treated to Date: 2
Plan Prescribed Dose Per Fraction: 2 Gy
Plan Total Fractions Prescribed: 30
Plan Total Prescribed Dose: 60 Gy
Reference Point Dosage Given to Date: 4 Gy
Reference Point Session Dosage Given: 2 Gy
Session Number: 2

## 2021-11-30 LAB — ECHOCARDIOGRAM COMPLETE
Area-P 1/2: 3.72 cm2
S' Lateral: 3.1 cm

## 2021-11-30 MED ORDER — SODIUM CHLORIDE 0.9 % IV SOLN
10.0000 mg | Freq: Once | INTRAVENOUS | Status: AC
  Administered 2021-11-30: 10 mg via INTRAVENOUS
  Filled 2021-11-30: qty 10

## 2021-11-30 MED ORDER — SODIUM CHLORIDE 0.9 % IV SOLN
220.0000 mg | Freq: Once | INTRAVENOUS | Status: AC
  Administered 2021-11-30: 220 mg via INTRAVENOUS
  Filled 2021-11-30: qty 22

## 2021-11-30 MED ORDER — SODIUM CHLORIDE 0.9 % IV SOLN
45.0000 mg/m2 | Freq: Once | INTRAVENOUS | Status: AC
  Administered 2021-11-30: 90 mg via INTRAVENOUS
  Filled 2021-11-30: qty 15

## 2021-11-30 MED ORDER — SODIUM CHLORIDE 0.9 % IV SOLN
150.0000 mg | Freq: Once | INTRAVENOUS | Status: AC
  Administered 2021-11-30: 150 mg via INTRAVENOUS
  Filled 2021-11-30: qty 150

## 2021-11-30 MED ORDER — CETIRIZINE HCL 10 MG/ML IV SOLN
10.0000 mg | Freq: Once | INTRAVENOUS | Status: AC
  Administered 2021-11-30: 10 mg via INTRAVENOUS
  Filled 2021-11-30: qty 1

## 2021-11-30 MED ORDER — FAMOTIDINE IN NACL 20-0.9 MG/50ML-% IV SOLN
20.0000 mg | Freq: Once | INTRAVENOUS | Status: AC
  Administered 2021-11-30: 20 mg via INTRAVENOUS
  Filled 2021-11-30: qty 50

## 2021-11-30 MED ORDER — SODIUM CHLORIDE 0.9 % IV SOLN: Freq: Once | INTRAVENOUS | Status: AC

## 2021-11-30 NOTE — Telephone Encounter (Addendum)
Compazine /zofran allergy . Please escribe another antiemetic. Escribe to [facility] in [place]

## 2021-11-30 NOTE — Patient Instructions (Signed)
[facility]  Discharge Instructions: Thank you for choosing [facility] to provide your oncology and hematology care.   If you have a lab appointment with the [facility], please go directly to the [facility] and check in at the registration area.   Wear comfortable clothing and clothing appropriate for easy access to any Portacath or PICC line.   We strive to give you quality time with your provider. You may need to reschedule your appointment if you arrive late (15 or more minutes).  Arriving late affects you and other patients whose appointments are after yours.  Also, if you miss three or more appointments without notifying the office, you may be dismissed from the clinic at the provider's discretion.      For prescription refill requests, have your pharmacy contact our office and allow 72 hours for refills to be completed.    Today you received the following chemotherapy and/or immunotherapy agents: Paclitaxel/Carboplatin      To help prevent nausea and vomiting after your treatment, we encourage you to take your nausea medication as directed.  BELOW ARE SYMPTOMS THAT SHOULD BE REPORTED IMMEDIATELY: *FEVER GREATER THAN 100.4 F (38 C) OR HIGHER *CHILLS OR SWEATING *NAUSEA AND VOMITING THAT IS NOT CONTROLLED WITH YOUR NAUSEA MEDICATION *UNUSUAL SHORTNESS OF BREATH *UNUSUAL BRUISING OR BLEEDING *URINARY PROBLEMS (pain or burning when urinating, or frequent urination) *BOWEL PROBLEMS (unusual diarrhea, constipation, pain near the anus) TENDERNESS IN MOUTH AND THROAT WITH OR WITHOUT PRESENCE OF ULCERS (sore throat, sores in mouth, or a toothache) UNUSUAL RASH, SWELLING OR PAIN  UNUSUAL VAGINAL DISCHARGE OR ITCHING   Items with * indicate a potential emergency and should be followed up as soon as possible or go to the Emergency Department if any problems should occur.  Please show the CHEMOTHERAPY ALERT CARD or IMMUNOTHERAPY ALERT CARD at  check-in to the Emergency Department and triage nurse.  Should you have questions after your visit or need to cancel or reschedule your appointment, please contact [facility]  Dept: [phone]  and follow the prompts.  Office hours are 8:00 a.m. to 4:30 p.m. Monday - Friday. Please note that voicemails left after 4:00 p.m. may not be returned until the following business day.  We are closed weekends and major holidays. You have access to a nurse at all times for urgent questions. Please call the main number to the clinic Dept: [phone] and follow the prompts.   For any non-urgent questions, you may also contact your provider using MyChart. We now offer e-Visits for anyone 1 and older to request care online for non-urgent symptoms. For details visit [email or web address].   Also download the MyChart app! Go to the app store, search "MyChart", open the app, select [facility], and log in with your MyChart username and password.  Masks are optional in the cancer centers. If you would like for your care team to wear a mask while they are taking care of you, please let them know. You may have one support person who is at least 67 years old accompany you for your appointments.

## 2021-11-30 NOTE — Progress Notes (Signed)
Spoke w/ RN and pt was weighed on 3 different occasions with 3 different weighs being obtained. Patient states he was ~189 lbs on last week when weighed @  his living facility. Documented weight today is 81 kg (~178.5 lbs). Dr. Julien Nordmann is ok with using the weight from today of 81 kg.   Larene Beach, PharmD

## 2021-12-01 ENCOUNTER — Other Ambulatory Visit: Payer: Self-pay

## 2021-12-01 ENCOUNTER — Ambulatory Visit
Admission: RE | Admit: 2021-12-01 | Discharge: 2021-12-01 | Disposition: A | Discharge status: Home / Self Care | Payer: Medicare HMO | Source: Ambulatory Visit | Attending: Radiation Oncology | Admitting: Radiation Oncology

## 2021-12-01 ENCOUNTER — Encounter: Payer: Self-pay | Admitting: Internal Medicine

## 2021-12-01 DIAGNOSIS — Z510 Encounter for antineoplastic radiation therapy: Secondary | ICD-10-CM | POA: Diagnosis not present

## 2021-12-01 DIAGNOSIS — C3481 Malignant neoplasm of overlapping sites of right bronchus and lung: Secondary | ICD-10-CM | POA: Diagnosis not present

## 2021-12-01 DIAGNOSIS — F17218 Nicotine dependence, cigarettes, with other nicotine-induced disorders: Secondary | ICD-10-CM | POA: Diagnosis not present

## 2021-12-01 LAB — RAD ONC ARIA SESSION SUMMARY
Course Elapsed Days: 2
Plan Fractions Treated to Date: 3
Plan Prescribed Dose Per Fraction: 2 Gy
Plan Total Fractions Prescribed: 30
Plan Total Prescribed Dose: 60 Gy
Reference Point Dosage Given to Date: 6 Gy
Reference Point Session Dosage Given: 2 Gy
Session Number: 3

## 2021-12-01 NOTE — Progress Notes (Signed)
Pt doesn't have a cell on file to call regarding the [place] so I emailed Meredith in the radiation dept requesting she reach out to him about the grant.

## 2021-12-02 ENCOUNTER — Other Ambulatory Visit: Payer: Self-pay

## 2021-12-02 ENCOUNTER — Ambulatory Visit
Admission: RE | Admit: 2021-12-02 | Discharge: 2021-12-02 | Disposition: A | Discharge status: Home / Self Care | Payer: Medicare HMO | Source: Ambulatory Visit | Attending: Radiation Oncology | Admitting: Radiation Oncology

## 2021-12-02 ENCOUNTER — Other Ambulatory Visit: Payer: Self-pay | Admitting: Medical Oncology

## 2021-12-02 ENCOUNTER — Telehealth: Payer: Self-pay

## 2021-12-02 DIAGNOSIS — R410 Disorientation, unspecified: Secondary | ICD-10-CM | POA: Diagnosis not present

## 2021-12-02 DIAGNOSIS — F419 Anxiety disorder, unspecified: Secondary | ICD-10-CM | POA: Diagnosis not present

## 2021-12-02 DIAGNOSIS — C3401 Malignant neoplasm of right main bronchus: Secondary | ICD-10-CM | POA: Diagnosis not present

## 2021-12-02 DIAGNOSIS — Z510 Encounter for antineoplastic radiation therapy: Secondary | ICD-10-CM | POA: Diagnosis not present

## 2021-12-02 DIAGNOSIS — C3481 Malignant neoplasm of overlapping sites of right bronchus and lung: Secondary | ICD-10-CM | POA: Diagnosis not present

## 2021-12-02 DIAGNOSIS — F17218 Nicotine dependence, cigarettes, with other nicotine-induced disorders: Secondary | ICD-10-CM | POA: Diagnosis not present

## 2021-12-02 LAB — RAD ONC ARIA SESSION SUMMARY
Course Elapsed Days: 3
Plan Fractions Treated to Date: 4
Plan Prescribed Dose Per Fraction: 2 Gy
Plan Total Fractions Prescribed: 30
Plan Total Prescribed Dose: 60 Gy
Reference Point Dosage Given to Date: 8 Gy
Reference Point Session Dosage Given: 2 Gy
Session Number: 4

## 2021-12-02 MED ORDER — SONAFINE EX EMUL
1.0000 | Freq: Once | CUTANEOUS | Status: AC
  Administered 2021-12-02: 1 via TOPICAL

## 2021-12-02 NOTE — Telephone Encounter (Signed)
Spoke with Kenneth Penner LPN at [facility]. [name] is doing fine. He ate a small amount yesterday. He is drinking and urinating well. He is slightly confused this am but went outside to smoke.  [name] has the office number to call [phone] if they have any questions or concerns.

## 2021-12-02 NOTE — Telephone Encounter (Signed)
-----   Message from Jesse Fall, RN sent at 12/01/2021  6:25 PM EDT ----- Regarding: FW: First time Carbo/Taxol Dr. Julien Nordmann patient I completed by mistake,  Needs call back 10/12.  Sorry  ----- Message ----- From: Juanetta Gosling, RN Sent: 12/01/2021   3:55 PM EDT To: [facility] Triage Nurse [facility] Subject: First time Carbo/Taxol Dr. Julien Nordmann patient     First time carbo/taxol- handled tx well without incident. Patient lives at [facility], so may need to talk to a nurse there as well about patients status. Dr. Julien Nordmann patient- needs first time call back.

## 2021-12-03 ENCOUNTER — Telehealth: Payer: Self-pay | Admitting: Medical Oncology

## 2021-12-03 ENCOUNTER — Other Ambulatory Visit: Payer: Self-pay

## 2021-12-03 ENCOUNTER — Ambulatory Visit
Admission: RE | Admit: 2021-12-03 | Discharge: 2021-12-03 | Disposition: A | Discharge status: Home / Self Care | Payer: Medicare HMO | Source: Ambulatory Visit | Attending: Radiation Oncology | Admitting: Radiation Oncology

## 2021-12-03 DIAGNOSIS — C3481 Malignant neoplasm of overlapping sites of right bronchus and lung: Secondary | ICD-10-CM | POA: Diagnosis not present

## 2021-12-03 DIAGNOSIS — Z510 Encounter for antineoplastic radiation therapy: Secondary | ICD-10-CM | POA: Diagnosis not present

## 2021-12-03 DIAGNOSIS — F17218 Nicotine dependence, cigarettes, with other nicotine-induced disorders: Secondary | ICD-10-CM | POA: Diagnosis not present

## 2021-12-03 LAB — RAD ONC ARIA SESSION SUMMARY
Course Elapsed Days: 4
Plan Fractions Treated to Date: 5
Plan Prescribed Dose Per Fraction: 2 Gy
Plan Total Fractions Prescribed: 30
Plan Total Prescribed Dose: 60 Gy
Reference Point Dosage Given to Date: 10 Gy
Reference Point Session Dosage Given: 2 Gy
Session Number: 5

## 2021-12-03 NOTE — Telephone Encounter (Signed)
No answer

## 2021-12-05 NOTE — Progress Notes (Unsigned)
[facility] OFFICE PROGRESS NOTE  [name], MD [address] [address]  DIAGNOSIS: Stage IIIB (T3, N2, M0) non-small cell lung cancer, squamous cell carcinoma presented with large central obstructive right lower lobe lung mass with right paratracheal lymphadenopathy.  The patient also had L1 compression fracture that could be pathologic in nature as well as left lower lobe pulmonary nodule.  PRIOR THERAPY: None  CURRENT THERAPY: Concurrent chemoradiation with weekly carboplatin for AUC of 2 and paclitaxel 45 Mg/M2.  First dose November 29, 2021.  INTERVAL HISTORY: [name] 66 y.o. male returns to the clinic today for a follow up visit accompanied by a member of the SNF. The patient was recently diagnosed with lung cancer. He states his workup started after hemoptysis about 1 month ago. He denies any hemoptysis since that time although he has baseline microcytic anemia on labs even prior to starting any treatment. He is reportedly taking an iron supplement daily and has been so for several months. He denies any other signs or symptoms of bleeding. He is not interested in a blood transfusion this week.    He is currently undergoing treatment with concurrent chemoradiation.  His last day radiation is tentatively scheduled for 11/22.  He underwent his first cycle of treatment last week and he tolerated it well without any concerning adverse side effects.  He denies any fever, chills, or night sweats. He reports he lost weight prior to his diagnosis. He does not like the taste of boost/ensure.  He reportedly is taking a calcium supplement. He has stable/slightly improved baseline dyspnea on exertion. He reports his cough is improved. Denies any chest pain. Denies any nausea, vomiting, diarrhea, or constipation.  The patient's staging brain MRI was negative for any metastatic disease.  Denies any headache or visual changes.  He is here today for evaluation  and repeat blood work before undergoing cycle #2.    MEDICAL HISTORY: Past Medical History:  Diagnosis Date   Anginal pain ([place])    Anxiety    Arthritis    "right hip; herniated L4-5" (11/05/2015)   Asthma    Bipolar 1 disorder (HCC)    CHF (congestive heart failure) (HCC)    Chronic bronchitis (HCC)    Chronic lower back pain    Chronic pain disorder    Community acquired pneumonia 10/30/2016   COPD (chronic obstructive pulmonary disease) ([place])    Coronary artery disease    Coronary atherosclerosis 12/05/2008   Qualifier: Diagnosis of  By: Jaramillo, [place], HX OF 12/05/2008   Qualifier: Diagnosis of  By: Jaramillo, [place], MAJOR 12/05/2008   Qualifier: Diagnosis of  By: Sidney Ace     Diabetes mellitus type 2 with complications ([place]) 03/20/7865   Diverticulitis large intestine    DVT (deep venous thrombosis) ([place]) 1991   "left calf; after 1st knee scope"   EROSIVE GASTRITIS 12/05/2008   Qualifier: Diagnosis of  By: Sidney Ace     Fungal esophagitis    GERD 12/05/2008   Qualifier: Diagnosis of  By: Sidney Ace     GERD (gastroesophageal reflux disease)    Heart attack ([place]) 2010   BMS x 2 RCA   High cholesterol    Hyperlipidemia    Hypertension    Hypothyroidism    MRSA (methicillin resistant staph aureus) culture positive    Myocardial infarction ([place])    MYOCARDIAL INFARCTION, HX OF 12/05/2008  Qualifier: Diagnosis of  By: Sidney Ace     Obesity 12/05/2008   Qualifier: Diagnosis of  By: Sidney Ace     Pneumonia    "several times"   PULMONARY NODULE, LEFT LOWER LOBE 12/05/2008   Qualifier: Diagnosis of  By: Sidney Ace     S/P angioplasty with stent 11/05/15 PCI DES to mLAD  11/06/2015   Schizo affective schizophrenia ([place]) 2002   SOB (shortness of breath) 08/22/2016   Status post cholecystectomy 09/25/2014    ALLERGIES:  is allergic to rocephin [ceftriaxone], zofran [ondansetron hcl], ampicillin,  lipitor [atorvastatin], toradol [ketorolac tromethamine], and tramadol.  MEDICATIONS:  Current Outpatient Medications  Medication Sig Dispense Refill   acetaminophen (TYLENOL) 325 MG tablet Take 650 mg by mouth every 6 (six) hours as needed (pain.).     albuterol (PROVENTIL) (2.5 MG/3ML) 0.083% nebulizer solution Take 2.5 mg by nebulization every 6 (six) hours as needed for wheezing or shortness of breath.     albuterol (VENTOLIN HFA) 108 (90 Base) MCG/ACT inhaler Inhale 2 puffs into the lungs every 6 (six) hours as needed for wheezing or shortness of breath.     allopurinol (ZYLOPRIM) 100 MG tablet Take 100 mg by mouth daily. (0900)     aspirin EC 81 MG tablet Take 1 tablet (81 mg total) by mouth daily. Swallow whole. 90 tablet 3   cyclobenzaprine (FLEXERIL) 10 MG tablet Take 10 mg by mouth 3 (three) times daily. (0900, 1300 & 2100)     ferrous sulfate 325 (65 FE) MG tablet Take 325 mg by mouth in the morning and at bedtime. (0800 & 1600)     Fluticasone-Umeclidin-Vilant (TRELEGY ELLIPTA) 200-62.5-25 MCG/ACT AEPB Inhale 1 puff into the lungs daily. 14 each 0   furosemide (LASIX) 20 MG tablet Take 20 mg by mouth in the morning. (0900)     gabapentin (NEURONTIN) 400 MG capsule Take 400 mg by mouth 3 (three) times daily. (0900, 1300 & 2100)     Glucagon, rDNA, (GLUCAGON EMERGENCY) 1 MG KIT Inject 1 mg as directed as needed (hypoglycemia).     HUMALOG KWIKPEN 100 UNIT/ML KwikPen Inject 0-10 Units into the skin in the morning, at noon, in the evening, and at bedtime. Per Sliding Scale: 0-150=0 units 151-200=2 units 201-250=4 units 251-350=8 units 351-400=10 units     ipratropium-albuterol (DUONEB) 0.5-2.5 (3) MG/3ML SOLN Inhale 3 mLs into the lungs every 4 (four) hours as needed (wheezing/COPD).     levofloxacin (LEVAQUIN) 750 MG tablet Take 1 tablet (750 mg total) by mouth daily. 10 tablet 0   levothyroxine (SYNTHROID) 75 MCG tablet Take 75 mcg by mouth daily before breakfast.     LORazepam  (ATIVAN) 2 MG tablet Take 2 mg by mouth every 12 (twelve) hours. (0800 & 2000)     metFORMIN (GLUCOPHAGE) 500 MG tablet Take 500 mg by mouth 2 (two) times daily with a meal. (0900 & 1700)     metoprolol succinate (TOPROL-XL) 25 MG 24 hr tablet Take 25 mg by mouth in the morning. (0900)     omeprazole (PRILOSEC) 20 MG capsule Take 20 mg by mouth in the morning.     Oxycodone HCl 10 MG TABS Take 10 mg by mouth every 6 (six) hours as needed (pain).     polyethylene glycol (MIRALAX / GLYCOLAX) packet Take 17 g by mouth daily as needed for mild constipation.      pravastatin (PRAVACHOL) 80 MG tablet Take 80 mg by mouth at bedtime. (2000)  prochlorperazine (COMPAZINE) 10 MG tablet Take 1 tablet (10 mg total) by mouth every 6 (six) hours as needed for nausea or vomiting. 30 tablet 2   QUEtiapine (SEROQUEL) 100 MG tablet Take 100 mg by mouth at bedtime. (2000)     saccharomyces boulardii (FLORASTOR) 250 MG capsule Take 250 mg by mouth 2 (two) times daily. (0900 & 2100)     temazepam (RESTORIL) 30 MG capsule Take 30 mg by mouth at bedtime. (2000)     therapeutic multivitamin-minerals (THERAGRAN-M) tablet Take 1 tablet by mouth in the morning. (0900)     venlafaxine (EFFEXOR) 75 MG tablet Take 75 mg by mouth in the morning. (0900)     No current facility-administered medications for this visit.    SURGICAL HISTORY:  Past Surgical History:  Procedure Laterality Date   APPENDECTOMY     BIOPSY  11/16/2021   Procedure: BIOPSY;  Surgeon: Rigoberto Noel, MD;  Location: [facility];  Service: Cardiopulmonary;;   BRONCHIAL BRUSHINGS  11/16/2021   Procedure: BRONCHIAL BRUSHINGS;  Surgeon: Rigoberto Noel, MD;  Location: [facility];  Service: Cardiopulmonary;;   BRONCHIAL WASHINGS  11/16/2021   Procedure: BRONCHIAL WASHINGS;  Surgeon: Rigoberto Noel, MD;  Location: [facility];  Service: Cardiopulmonary;;   CARDIAC CATHETERIZATION N/A 11/05/2015   Procedure: Left Heart Cath and Coronary Angiography;   Surgeon: Nelva Bush, MD;  Location: [facility];  Service: Cardiovascular;  Laterality: N/A;   CARDIAC CATHETERIZATION N/A 11/05/2015   Procedure: Coronary Stent Intervention;  Surgeon: Nelva Bush, MD;  Location: [facility];  Service: Cardiovascular;  Laterality: N/A;   CARDIAC CATHETERIZATION N/A 11/05/2015   Procedure: Intravascular Pressure Wire/FFR Study;  Surgeon: Nelva Bush, MD;  Location: [facility];  Service: Cardiovascular;  Laterality: N/A;   CORONARY ANGIOPLASTY WITH STENT PLACEMENT  2010   90% RCA s/p BMS x 2, 50% LAD, 30% CFX, EF 45-50%   CORONARY ANGIOPLASTY WITH STENT PLACEMENT  11/05/2015   "1 today; makes a total of 4 stents" (11/05/2015)   ENDOBRONCHIAL ULTRASOUND Bilateral 11/16/2021   Procedure: ENDOBRONCHIAL ULTRASOUND;  Surgeon: Rigoberto Noel, MD;  [facility];  Service: Cardiopulmonary;  Laterality: Bilateral;   FINE NEEDLE ASPIRATION BIOPSY  11/16/2021   Procedure: FINE NEEDLE ASPIRATION BIOPSY;  Surgeon: Rigoberto Noel, MD;  [facility];  Service: Cardiopulmonary;;   HEMOSTASIS CONTROL  11/16/2021   Procedure: HEMOSTASIS CONTROL;  Surgeon: Rigoberto Noel, MD;  [facility];  Service: Cardiopulmonary;;   INGUINAL HERNIA REPAIR Right    KNEE ARTHROSCOPY Left X 3   LAPAROSCOPIC CHOLECYSTECTOMY     TONSILLECTOMY AND ADENOIDECTOMY     UPPER GI ENDOSCOPY  05/24/2016   UGI ENDO INCLUDE ESOPHAGUS, STOMACH & DUODENUM &/OR JEJUNUM; DX W/WO COLLECTION SPECIMEN BY BRUSH OR [place]; SURGEON :ALBERT San Morelle, MD LOCATION; [facility]    REVIEW OF SYSTEMS:   Review of Systems  Constitutional: Positive for fatigue, appetite change, weight loss.  Negative for chills and fever.  HENT: Negative for mouth sores, nosebleeds, sore throat and trouble swallowing.   Eyes: Negative for eye problems and icterus.  Respiratory: Positive for stable/slightly improved dyspnea on exertion.  Negative for cough (improved),  hemoptysis (resolved),  and wheezing.   Cardiovascular: Negative for chest pain and leg swelling.  Gastrointestinal: Negative for abdominal pain, constipation, diarrhea, nausea and vomiting.  Genitourinary: Negative for bladder incontinence, difficulty urinating, dysuria, frequency and hematuria.   Musculoskeletal: Positive for back pain. Negative for gait problem,  neck pain and neck stiffness.  Skin: Negative for itching and rash.  Neurological: Negative for dizziness, extremity weakness, gait problem, headaches, light-headedness and seizures.  Hematological: Negative for adenopathy. Does not bruise/bleed easily.  Psychiatric/Behavioral: Negative for confusion, depression and sleep disturbance. The patient is not nervous/anxious.     PHYSICAL EXAMINATION:  Blood pressure 102/68, pulse 88, temperature 99.2 F (37.3 C), temperature source Oral, resp. rate 18, height 6' (1.829 m), weight 185 lb 1.6 oz (84 kg), SpO2 92 %.  ECOG PERFORMANCE STATUS: 2  Physical Exam  Constitutional: Oriented to person, place, and time and chronically ill appearing male and in no distress.  HENT:  Head: Normocephalic and atraumatic.  Mouth/Throat: Oropharynx is clear and moist. No oropharyngeal exudate.  Eyes: Conjunctivae are normal. Right eye exhibits no discharge. Left eye exhibits no discharge. No scleral icterus.  Neck: Normal range of motion. Neck supple.  Cardiovascular: Normal rate, regular rhythm, normal heart sounds and intact distal pulses.   Pulmonary/Chest: Effort normal. Quiet breath sounds bilaterally. No respiratory distress. No wheezes. No rales.  Abdominal: Soft. Bowel sounds are normal. Exhibits no distension and no mass. There is no tenderness.  Musculoskeletal: Normal range of motion. Exhibits no edema.  Lymphadenopathy:    No cervical adenopathy.  Neurological: Alert and oriented to person, place, and time. Exhibits muscle wasting. Examined in the wheelchair. Skin: Skin is warm and dry.  No rash noted. Not diaphoretic. No erythema. No pallor.  Psychiatric: Mood, memory and judgment normal.  Vitals reviewed.  LABORATORY DATA: Lab Results  Component Value Date   WBC 6.4 12/07/2021   HGB 8.1 (L) 12/07/2021   HCT 24.6 (L) 12/07/2021   MCV 76.6 (L) 12/07/2021   PLT 159 12/07/2021      Chemistry      Component Value Date/Time   NA 136 12/07/2021 0904   NA 138 01/21/2017 0000   K 3.8 12/07/2021 0904   CL 99 12/07/2021 0904   CO2 28 12/07/2021 0904   BUN 6 (L) 12/07/2021 0904   BUN 16 01/21/2017 0000   CREATININE 0.55 (L) 12/07/2021 0904   GLU 103 10/25/2016 0000      Component Value Date/Time   CALCIUM 7.4 (L) 12/07/2021 0904   ALKPHOS 825 (H) 12/07/2021 0904   AST 68 (H) 12/07/2021 0904   ALT 42 12/07/2021 0904   BILITOT 0.5 12/07/2021 0904       RADIOGRAPHIC STUDIES:  ECHOCARDIOGRAM COMPLETE  Result Date: 11/30/2021    ECHOCARDIOGRAM REPORT   Patient Name:   KYAN YURKOVICH Date of Exam: 11/26/2021 Medical Rec #:  [id number]      Height:       72.0 in Accession #:    [id number]     Weight:       212.0 lb Date of Birth:  03-24-1955      BSA:          2.184 m Patient Age:    54 years       BP:           113/70 mmHg Patient Gender: M              HR:           87 bpm. Exam Location:  [facility] Procedure: 2D Echo, Cardiac Doppler, Color Doppler and Intracardiac            Opacification Agent Indications:    I25.10 CAD  History:        Patient has prior history  of Echocardiogram examinations, most                 recent 11/19/2008. CAD and Previous Myocardial Infarction, COPD;                 Risk Factors:Hypertension, Dyslipidemia, Current Smoker,                 Diabetes and Obesity.  Sonographer:    Samule Ohm RDCS Referring Phys: [id number] [facility]  Sonographer Comments: Technically difficult study due to poor echo windows, suboptimal parasternal window and Technically challenging study due to limited acoustic windows. Image acquisition challenging  due to patient body habitus and Image acquisition challenging due to COPD. IMPRESSIONS  1. Left ventricular ejection fraction, by estimation, is 55 to 60%. The left ventricle has normal function. Left ventricular endocardial border not optimally defined to evaluate regional wall motion. Left ventricular diastolic parameters are indeterminate.  2. Right ventricular systolic function is normal. The right ventricular size is normal.  3. The mitral valve is normal in structure. Trivial mitral valve regurgitation.  4. The aortic valve was not well visualized. Aortic valve regurgitation is not visualized.  5. Very limited study due to poor sound wave transmission. Overall LV function looks normal, in some images there appears to be hypokinesis of the anterosept wall but this is not well seen even with Definity. There appears to be a prominent epicardial fat pad. FINDINGS  Left Ventricle: Left ventricular ejection fraction, by estimation, is 55 to 60%. The left ventricle has normal function. Left ventricular endocardial border not optimally defined to evaluate regional wall motion. Definity contrast agent was given IV to delineate the left ventricular endocardial borders. The left ventricular internal cavity size was normal in size. Suboptimal image quality limits for assessment of left ventricular hypertrophy. Left ventricular diastolic parameters are indeterminate. Right Ventricle: The right ventricular size is normal. No increase in right ventricular wall thickness. Right ventricular systolic function is normal. Left Atrium: Left atrial size was normal in size. Right Atrium: Right atrial size was normal in size. Pericardium: There is no evidence of pericardial effusion. Mitral Valve: The mitral valve is normal in structure. Trivial mitral valve regurgitation. Tricuspid Valve: The tricuspid valve is not well visualized. Tricuspid valve regurgitation is trivial. Aortic Valve: The aortic valve was not well visualized.  Aortic valve regurgitation is not visualized. Pulmonic Valve: The pulmonic valve was not well visualized. Pulmonic valve regurgitation is not visualized. Aorta: The aortic root and ascending aorta are structurally normal, with no evidence of dilitation. IAS/Shunts: The interatrial septum was not assessed.  LEFT VENTRICLE PLAX 2D LVIDd:         4.20 cm LVIDs:         3.10 cm LV PW:         1.00 cm LV IVS:        1.00 cm LVOT diam:     2.00 cm LV SV:         46 LV SV Index:   21 LVOT Area:     3.14 cm  IVC IVC diam: 1.30 cm LEFT ATRIUM             Index        RIGHT ATRIUM           Index LA diam:        3.10 cm 1.42 cm/m   RA Pressure: 3.00 mmHg LA Vol (A2C):   51.8 ml 23.72 ml/m  RA Area:  12.60 cm LA Vol (A4C):   41.7 ml 19.09 ml/m  RA Volume:   27.70 ml  12.68 ml/m LA Biplane Vol: 47.0 ml 21.52 ml/m  AORTIC VALVE LVOT Vmax:   85.40 cm/s LVOT Vmean:  57.000 cm/s LVOT VTI:    0.148 m  AORTA Ao Root diam: 3.30 cm MITRAL VALVE                TRICUSPID VALVE MV Area (PHT): 3.72 cm     Estimated RAP:  3.00 mmHg MV Decel Time: 204 msec MV E velocity: 110.00 cm/s  SHUNTS MV A velocity: 93.30 cm/s   Systemic VTI:  0.15 m MV E/A ratio:  1.18         Systemic Diam: 2.00 cm Glori Bickers MD Electronically signed by Glori Bickers MD Signature Date/Time: 11/30/2021/2:55:23 PM    Final    MR Brain W Wo Contrast  Result Date: 11/28/2021 CLINICAL DATA:  Non-small cell lung cancer staging. EXAM: MRI HEAD WITHOUT AND WITH CONTRAST TECHNIQUE: Multiplanar, multiecho pulse sequences of the brain and surrounding structures were obtained without and with intravenous contrast. CONTRAST:  9.5 cc of vueway intravenous COMPARISON:  None Available. FINDINGS: Brain: No abnormal enhancement or swelling to suggest metastatic disease. Mild cerebral volume loss and chronic small vessel ischemia in the hemispheric white matter. No acute or subacute infarct, hydrocephalus, collection, or swelling. Vascular: Major flow voids and  vascular enhancements are preserved Skull and upper cervical spine: Normal marrow signal Sinuses/Orbits: Negative Other: Unavoidable motion artifact due to mandibular motion. IMPRESSION: 1. Negative for metastatic disease. 2. Unavoidable motion artifact. Electronically Signed   By: Jorje Guild M.D.   On: 11/28/2021 11:16     ASSESSMENT/PLAN:  This is a very pleasant 66 year old Caucasian male diagnosed with stage IIIb (T3, N2, M0) non-small cell lung cancer, squamous cell carcinoma.  He presented with a large right upper lobe lung mass in addition to mediastinal lymphadenopathy.  He was diagnosed in September 2023.  His PD-L1 expression is 5%.   The patient is currently undergoing a course of concurrent chemoradiation with carboplatin for an AUC of 2 and paclitaxel 45 mg per metered squared.  He status post 1 cycle and tolerated it well without any concerning adverse side effects.  His last day radiation is tentatively scheduled for 01/12/22.  Labs reviewed.  He has stable but baseline microcytic anemia.  The patient states he has been taking an iron supplement for several months.  I will arrange for ferritin and iron studies to be performed at his next lab visit.  If significantly low, we can consider IV iron.  I also discussed with the patient that his hemoglobin is getting close to the threshold where I would consider blood transfusion.  He is not interested in a blood transfusion at this time.  I have arranged for sample blood bank to be performed on a weekly basis starting at his next lab visit.  If he drops below hemoglobin of 8 I may consider arranging for a blood transfusion.  The patient is in agreement with this plan and he will call us if he develops any new or worsening symptoms of anemia such as fatigue, shortness of breath, weakness, pallor, etc. if he feels like he needs to be evaluated sooner.  Recommend that he proceed with cycle #2 today scheduled.  I let the patient know that his  staging brain MRI which was negative for any metastatic disease to the brain.  We will see him  back for follow-up visit in 2 weeks for evaluation and repeat blood work before undergoing cycle #4.  I asked the patient to clarify the listed allergy of Compazine.  He states that he outgrew this allergy and Compazine is actually effective for him.  He tells me that he is actually allergic to Zofran.   The patient's calcium is low.  He reportedly is taking a calcium supplement.  I encouraged him to continue taking a calcium supplement and increase his dietary intake of calcium rich food.  He does not like the taste of boost and Ensure.  I may refer him to member the nutritionist team given his decreased appetite and weight loss.  The patient was advised to call immediately if he has any concerning symptoms in the interval. The patient voices understanding of current disease status and treatment options and is in agreement with the current care plan. All questions were answered. The patient knows to call the clinic with any problems, questions or concerns. We can certainly see the patient much sooner if necessary     Orders Placed This Encounter  Procedures   Iron and Iron Binding Capacity ([facility] only)    Standing Status:   Future    Standing Expiration Date:   12/08/2022   Ferritin    Standing Status:   Future    Standing Expiration Date:   12/08/2022   Sample to Blood Bank    Standing Status:   Standing    Number of Occurrences:   5    Standing Expiration Date:   12/08/2022     The total time spent in the appointment was 20-29 minutes.   Leonie Amacher L Arron Tetrault, PA-C 12/07/21

## 2021-12-06 ENCOUNTER — Other Ambulatory Visit: Payer: Self-pay

## 2021-12-06 ENCOUNTER — Ambulatory Visit
Admission: RE | Admit: 2021-12-06 | Discharge: 2021-12-06 | Disposition: A | Discharge status: Home / Self Care | Payer: Medicare HMO | Source: Ambulatory Visit | Attending: Radiation Oncology | Admitting: Radiation Oncology

## 2021-12-06 ENCOUNTER — Ambulatory Visit: Payer: Medicare HMO

## 2021-12-06 ENCOUNTER — Other Ambulatory Visit: Payer: Medicare HMO

## 2021-12-06 DIAGNOSIS — C3401 Malignant neoplasm of right main bronchus: Secondary | ICD-10-CM | POA: Diagnosis not present

## 2021-12-06 DIAGNOSIS — C3431 Malignant neoplasm of lower lobe, right bronchus or lung: Secondary | ICD-10-CM | POA: Diagnosis not present

## 2021-12-06 DIAGNOSIS — D649 Anemia, unspecified: Secondary | ICD-10-CM | POA: Diagnosis not present

## 2021-12-06 DIAGNOSIS — Z510 Encounter for antineoplastic radiation therapy: Secondary | ICD-10-CM | POA: Diagnosis not present

## 2021-12-06 DIAGNOSIS — G894 Chronic pain syndrome: Secondary | ICD-10-CM | POA: Diagnosis not present

## 2021-12-06 DIAGNOSIS — C3481 Malignant neoplasm of overlapping sites of right bronchus and lung: Secondary | ICD-10-CM | POA: Diagnosis not present

## 2021-12-06 DIAGNOSIS — F17218 Nicotine dependence, cigarettes, with other nicotine-induced disorders: Secondary | ICD-10-CM | POA: Diagnosis not present

## 2021-12-06 DIAGNOSIS — D509 Iron deficiency anemia, unspecified: Secondary | ICD-10-CM | POA: Diagnosis not present

## 2021-12-06 DIAGNOSIS — R630 Anorexia: Secondary | ICD-10-CM | POA: Diagnosis not present

## 2021-12-06 LAB — RAD ONC ARIA SESSION SUMMARY
Course Elapsed Days: 7
Plan Fractions Treated to Date: 6
Plan Prescribed Dose Per Fraction: 2 Gy
Plan Total Fractions Prescribed: 30
Plan Total Prescribed Dose: 60 Gy
Reference Point Dosage Given to Date: 12 Gy
Reference Point Session Dosage Given: 2 Gy
Session Number: 6

## 2021-12-06 LAB — FUNGUS CULTURE RESULT

## 2021-12-06 LAB — FUNGUS CULTURE WITH STAIN

## 2021-12-06 LAB — FUNGAL ORGANISM REFLEX

## 2021-12-06 MED FILL — Dexamethasone Sodium Phosphate Inj 100 MG/10ML: INTRAMUSCULAR | Qty: 1 | Status: AC

## 2021-12-07 ENCOUNTER — Other Ambulatory Visit: Payer: Self-pay | Admitting: Physician Assistant

## 2021-12-07 ENCOUNTER — Inpatient Hospital Stay: Payer: Medicare HMO

## 2021-12-07 ENCOUNTER — Ambulatory Visit
Admission: RE | Admit: 2021-12-07 | Discharge: 2021-12-07 | Disposition: A | Discharge status: Home / Self Care | Payer: Medicare HMO | Source: Ambulatory Visit | Attending: Radiation Oncology | Admitting: Radiation Oncology

## 2021-12-07 ENCOUNTER — Inpatient Hospital Stay (HOSPITAL_BASED_OUTPATIENT_CLINIC_OR_DEPARTMENT_OTHER): Payer: Medicare HMO | Admitting: Physician Assistant

## 2021-12-07 ENCOUNTER — Ambulatory Visit: Payer: Medicare HMO

## 2021-12-07 ENCOUNTER — Other Ambulatory Visit: Payer: Self-pay

## 2021-12-07 VITALS — BP 102/68 | HR 88 | Temp 99.2°F | Resp 18 | Ht 72.0 in | Wt 185.1 lb

## 2021-12-07 VITALS — BP 109/83 | HR 96 | Resp 17

## 2021-12-07 DIAGNOSIS — D649 Anemia, unspecified: Secondary | ICD-10-CM | POA: Diagnosis not present

## 2021-12-07 DIAGNOSIS — D509 Iron deficiency anemia, unspecified: Secondary | ICD-10-CM | POA: Diagnosis not present

## 2021-12-07 DIAGNOSIS — R630 Anorexia: Secondary | ICD-10-CM

## 2021-12-07 DIAGNOSIS — C3431 Malignant neoplasm of lower lobe, right bronchus or lung: Secondary | ICD-10-CM

## 2021-12-07 DIAGNOSIS — R110 Nausea: Secondary | ICD-10-CM

## 2021-12-07 DIAGNOSIS — D638 Anemia in other chronic diseases classified elsewhere: Secondary | ICD-10-CM | POA: Insufficient documentation

## 2021-12-07 DIAGNOSIS — F17218 Nicotine dependence, cigarettes, with other nicotine-induced disorders: Secondary | ICD-10-CM | POA: Diagnosis not present

## 2021-12-07 DIAGNOSIS — Z510 Encounter for antineoplastic radiation therapy: Secondary | ICD-10-CM | POA: Diagnosis not present

## 2021-12-07 DIAGNOSIS — C3481 Malignant neoplasm of overlapping sites of right bronchus and lung: Secondary | ICD-10-CM | POA: Diagnosis not present

## 2021-12-07 DIAGNOSIS — Z5111 Encounter for antineoplastic chemotherapy: Secondary | ICD-10-CM

## 2021-12-07 LAB — RAD ONC ARIA SESSION SUMMARY
Course Elapsed Days: 8
Plan Fractions Treated to Date: 7
Plan Prescribed Dose Per Fraction: 2 Gy
Plan Total Fractions Prescribed: 30
Plan Total Prescribed Dose: 60 Gy
Reference Point Dosage Given to Date: 14 Gy
Reference Point Session Dosage Given: 2 Gy
Session Number: 7

## 2021-12-07 LAB — CBC WITH DIFFERENTIAL (CANCER CENTER ONLY)
Abs Immature Granulocytes: 0.05 10*3/uL (ref 0.00–0.07)
Basophils Absolute: 0 10*3/uL (ref 0.0–0.1)
Basophils Relative: 0 %
Eosinophils Absolute: 0.2 10*3/uL (ref 0.0–0.5)
Eosinophils Relative: 3 %
HCT: 24.6 % — ABNORMAL LOW (ref 39.0–52.0)
Hemoglobin: 8.1 g/dL — ABNORMAL LOW (ref 13.0–17.0)
Immature Granulocytes: 1 %
Lymphocytes Relative: 19 %
Lymphs Abs: 1.2 10*3/uL (ref 0.7–4.0)
MCH: 25.2 pg — ABNORMAL LOW (ref 26.0–34.0)
MCHC: 32.9 g/dL (ref 30.0–36.0)
MCV: 76.6 fL — ABNORMAL LOW (ref 80.0–100.0)
Monocytes Absolute: 0.5 10*3/uL (ref 0.1–1.0)
Monocytes Relative: 7 %
Neutro Abs: 4.4 10*3/uL (ref 1.7–7.7)
Neutrophils Relative %: 70 %
Platelet Count: 159 10*3/uL (ref 150–400)
RBC: 3.21 MIL/uL — ABNORMAL LOW (ref 4.22–5.81)
RDW: 16.5 % — ABNORMAL HIGH (ref 11.5–15.5)
WBC Count: 6.4 10*3/uL (ref 4.0–10.5)
nRBC: 0 % (ref 0.0–0.2)

## 2021-12-07 LAB — CMP (CANCER CENTER ONLY)
ALT: 42 U/L (ref 0–44)
AST: 68 U/L — ABNORMAL HIGH (ref 15–41)
Albumin: 3.3 g/dL — ABNORMAL LOW (ref 3.5–5.0)
Alkaline Phosphatase: 825 U/L — ABNORMAL HIGH (ref 38–126)
Anion gap: 9 (ref 5–15)
BUN: 6 mg/dL — ABNORMAL LOW (ref 8–23)
CO2: 28 mmol/L (ref 22–32)
Calcium: 7.4 mg/dL — ABNORMAL LOW (ref 8.9–10.3)
Chloride: 99 mmol/L (ref 98–111)
Creatinine: 0.55 mg/dL — ABNORMAL LOW (ref 0.61–1.24)
GFR, Estimated: >60 mL/min (ref >60)
Glucose, Bld: 96 mg/dL (ref 70–99)
Potassium: 3.8 mmol/L (ref 3.5–5.1)
Sodium: 136 mmol/L (ref 135–145)
Total Bilirubin: 0.5 mg/dL (ref 0.3–1.2)
Total Protein: 6.6 g/dL (ref 6.5–8.1)

## 2021-12-07 MED ORDER — SODIUM CHLORIDE 0.9 % IV SOLN
10.0000 mg | Freq: Once | INTRAVENOUS | Status: AC
  Administered 2021-12-07: 10 mg via INTRAVENOUS
  Filled 2021-12-07: qty 10

## 2021-12-07 MED ORDER — SODIUM CHLORIDE 0.9 % IV SOLN
12.5000 mg | Freq: Once | INTRAVENOUS | Status: DC
  Filled 2021-12-07: qty 0.5

## 2021-12-07 MED ORDER — FAMOTIDINE IN NACL 20-0.9 MG/50ML-% IV SOLN
20.0000 mg | Freq: Once | INTRAVENOUS | Status: AC
  Administered 2021-12-07: 20 mg via INTRAVENOUS
  Filled 2021-12-07: qty 50

## 2021-12-07 MED ORDER — SODIUM CHLORIDE 0.9 % IV SOLN
45.0000 mg/m2 | Freq: Once | INTRAVENOUS | Status: AC
  Administered 2021-12-07: 90 mg via INTRAVENOUS
  Filled 2021-12-07: qty 15

## 2021-12-07 MED ORDER — SODIUM CHLORIDE 0.9 % IV SOLN
210.0000 mg | Freq: Once | INTRAVENOUS | Status: AC
  Administered 2021-12-07: 210 mg via INTRAVENOUS
  Filled 2021-12-07: qty 21

## 2021-12-07 MED ORDER — CETIRIZINE HCL 10 MG/ML IV SOLN
10.0000 mg | Freq: Once | INTRAVENOUS | Status: AC
  Administered 2021-12-07: 10 mg via INTRAVENOUS
  Filled 2021-12-07: qty 1

## 2021-12-07 MED ORDER — SODIUM CHLORIDE 0.9 % IV SOLN
150.0000 mg | Freq: Once | INTRAVENOUS | Status: AC
  Administered 2021-12-07: 150 mg via INTRAVENOUS
  Filled 2021-12-07: qty 150

## 2021-12-07 MED ORDER — SODIUM CHLORIDE 0.9 % IV SOLN: Freq: Once | INTRAVENOUS | Status: AC

## 2021-12-07 NOTE — Patient Instructions (Signed)
[facility]  Discharge Instructions: Thank you for choosing [facility] to provide your oncology and hematology care.   If you have a lab appointment with the [facility], please go directly to the [facility] and check in at the registration area.   Wear comfortable clothing and clothing appropriate for easy access to any Portacath or PICC line.   We strive to give you quality time with your provider. You may need to reschedule your appointment if you arrive late (15 or more minutes).  Arriving late affects you and other patients whose appointments are after yours.  Also, if you miss three or more appointments without notifying the office, you may be dismissed from the clinic at the provider's discretion.      For prescription refill requests, have your pharmacy contact our office and allow 72 hours for refills to be completed.    Today you received the following chemotherapy and/or immunotherapy agents: paclitaxel and carboplatin      To help prevent nausea and vomiting after your treatment, we encourage you to take your nausea medication as directed.  BELOW ARE SYMPTOMS THAT SHOULD BE REPORTED IMMEDIATELY: *FEVER GREATER THAN 100.4 F (38 C) OR HIGHER *CHILLS OR SWEATING *NAUSEA AND VOMITING THAT IS NOT CONTROLLED WITH YOUR NAUSEA MEDICATION *UNUSUAL SHORTNESS OF BREATH *UNUSUAL BRUISING OR BLEEDING *URINARY PROBLEMS (pain or burning when urinating, or frequent urination) *BOWEL PROBLEMS (unusual diarrhea, constipation, pain near the anus) TENDERNESS IN MOUTH AND THROAT WITH OR WITHOUT PRESENCE OF ULCERS (sore throat, sores in mouth, or a toothache) UNUSUAL RASH, SWELLING OR PAIN  UNUSUAL VAGINAL DISCHARGE OR ITCHING   Items with * indicate a potential emergency and should be followed up as soon as possible or go to the Emergency Department if any problems should occur.  Please show the CHEMOTHERAPY ALERT CARD or IMMUNOTHERAPY ALERT  CARD at check-in to the Emergency Department and triage nurse.  Should you have questions after your visit or need to cancel or reschedule your appointment, please contact [facility]  Dept: [phone]  and follow the prompts.  Office hours are 8:00 a.m. to 4:30 p.m. Monday - Friday. Please note that voicemails left after 4:00 p.m. may not be returned until the following business day.  We are closed weekends and major holidays. You have access to a nurse at all times for urgent questions. Please call the main number to the clinic Dept: [phone] and follow the prompts.   For any non-urgent questions, you may also contact your provider using MyChart. We now offer e-Visits for anyone 110 and older to request care online for non-urgent symptoms. For details visit [email or web address].   Also download the MyChart app! Go to the app store, search "MyChart", open the app, select [facility], and log in with your MyChart username and password.  Masks are optional in the cancer centers. If you would like for your care team to wear a mask while they are taking care of you, please let them know. You may have one support person who is at least 67 years old accompany you for your appointments.

## 2021-12-07 NOTE — Progress Notes (Signed)
Pt requested phenergan during infusion appointment today d/t nausea, upon this RN about to give pt phenergan, pt decided didn't want to wait for it to be infused and refused the phenergan. Phenergan sent back to pharmacy with a note.

## 2021-12-08 ENCOUNTER — Other Ambulatory Visit: Payer: Self-pay

## 2021-12-08 ENCOUNTER — Ambulatory Visit
Admission: RE | Admit: 2021-12-08 | Discharge: 2021-12-08 | Disposition: A | Discharge status: Home / Self Care | Payer: Medicare HMO | Source: Ambulatory Visit | Attending: Radiation Oncology | Admitting: Radiation Oncology

## 2021-12-08 DIAGNOSIS — C3401 Malignant neoplasm of right main bronchus: Secondary | ICD-10-CM | POA: Diagnosis not present

## 2021-12-08 DIAGNOSIS — G894 Chronic pain syndrome: Secondary | ICD-10-CM | POA: Diagnosis not present

## 2021-12-08 DIAGNOSIS — F17218 Nicotine dependence, cigarettes, with other nicotine-induced disorders: Secondary | ICD-10-CM | POA: Diagnosis not present

## 2021-12-08 DIAGNOSIS — Z510 Encounter for antineoplastic radiation therapy: Secondary | ICD-10-CM | POA: Diagnosis not present

## 2021-12-08 DIAGNOSIS — C3481 Malignant neoplasm of overlapping sites of right bronchus and lung: Secondary | ICD-10-CM | POA: Diagnosis not present

## 2021-12-08 LAB — RAD ONC ARIA SESSION SUMMARY
Course Elapsed Days: 9
Plan Fractions Treated to Date: 8
Plan Prescribed Dose Per Fraction: 2 Gy
Plan Total Fractions Prescribed: 30
Plan Total Prescribed Dose: 60 Gy
Reference Point Dosage Given to Date: 16 Gy
Reference Point Session Dosage Given: 2 Gy
Session Number: 8

## 2021-12-09 ENCOUNTER — Ambulatory Visit
Admission: RE | Admit: 2021-12-09 | Discharge: 2021-12-09 | Disposition: A | Discharge status: Home / Self Care | Payer: Medicare HMO | Source: Ambulatory Visit | Attending: Radiation Oncology | Admitting: Radiation Oncology

## 2021-12-09 ENCOUNTER — Other Ambulatory Visit: Payer: Self-pay

## 2021-12-09 DIAGNOSIS — F3132 Bipolar disorder, current episode depressed, moderate: Secondary | ICD-10-CM | POA: Diagnosis not present

## 2021-12-09 DIAGNOSIS — F17218 Nicotine dependence, cigarettes, with other nicotine-induced disorders: Secondary | ICD-10-CM | POA: Diagnosis not present

## 2021-12-09 DIAGNOSIS — Z510 Encounter for antineoplastic radiation therapy: Secondary | ICD-10-CM | POA: Diagnosis not present

## 2021-12-09 DIAGNOSIS — C3481 Malignant neoplasm of overlapping sites of right bronchus and lung: Secondary | ICD-10-CM | POA: Diagnosis not present

## 2021-12-09 LAB — RAD ONC ARIA SESSION SUMMARY
Course Elapsed Days: 10
Plan Fractions Treated to Date: 9
Plan Prescribed Dose Per Fraction: 2 Gy
Plan Total Fractions Prescribed: 30
Plan Total Prescribed Dose: 60 Gy
Reference Point Dosage Given to Date: 18 Gy
Reference Point Session Dosage Given: 2 Gy
Session Number: 9

## 2021-12-10 ENCOUNTER — Other Ambulatory Visit: Payer: Self-pay

## 2021-12-10 ENCOUNTER — Ambulatory Visit
Admission: RE | Admit: 2021-12-10 | Discharge: 2021-12-10 | Disposition: A | Discharge status: Home / Self Care | Payer: Medicare HMO | Source: Ambulatory Visit | Attending: Radiation Oncology | Admitting: Radiation Oncology

## 2021-12-10 DIAGNOSIS — Z510 Encounter for antineoplastic radiation therapy: Secondary | ICD-10-CM | POA: Diagnosis not present

## 2021-12-10 DIAGNOSIS — F17218 Nicotine dependence, cigarettes, with other nicotine-induced disorders: Secondary | ICD-10-CM | POA: Diagnosis not present

## 2021-12-10 DIAGNOSIS — C3481 Malignant neoplasm of overlapping sites of right bronchus and lung: Secondary | ICD-10-CM | POA: Diagnosis not present

## 2021-12-10 LAB — RAD ONC ARIA SESSION SUMMARY
Course Elapsed Days: 11
Plan Fractions Treated to Date: 10
Plan Prescribed Dose Per Fraction: 2 Gy
Plan Total Fractions Prescribed: 30
Plan Total Prescribed Dose: 60 Gy
Reference Point Dosage Given to Date: 20 Gy
Reference Point Session Dosage Given: 2 Gy
Session Number: 10

## 2021-12-10 MED FILL — Dexamethasone Sodium Phosphate Inj 100 MG/10ML: INTRAMUSCULAR | Qty: 1 | Status: AC

## 2021-12-10 MED FILL — Fosaprepitant Dimeglumine For IV Infusion 150 MG (Base Eq): INTRAVENOUS | Qty: 5 | Status: AC

## 2021-12-13 ENCOUNTER — Ambulatory Visit
Admission: RE | Admit: 2021-12-13 | Discharge: 2021-12-13 | Disposition: A | Discharge status: Home / Self Care | Payer: Medicare HMO | Source: Ambulatory Visit | Attending: Radiation Oncology | Admitting: Radiation Oncology

## 2021-12-13 ENCOUNTER — Inpatient Hospital Stay: Payer: Medicare HMO

## 2021-12-13 ENCOUNTER — Other Ambulatory Visit: Payer: Self-pay

## 2021-12-13 ENCOUNTER — Ambulatory Visit: Payer: Medicare HMO | Admitting: Physician Assistant

## 2021-12-13 DIAGNOSIS — Z510 Encounter for antineoplastic radiation therapy: Secondary | ICD-10-CM | POA: Diagnosis not present

## 2021-12-13 DIAGNOSIS — C3481 Malignant neoplasm of overlapping sites of right bronchus and lung: Secondary | ICD-10-CM | POA: Diagnosis not present

## 2021-12-13 DIAGNOSIS — F17218 Nicotine dependence, cigarettes, with other nicotine-induced disorders: Secondary | ICD-10-CM | POA: Diagnosis not present

## 2021-12-13 LAB — RAD ONC ARIA SESSION SUMMARY
Course Elapsed Days: 14
Plan Fractions Treated to Date: 11
Plan Prescribed Dose Per Fraction: 2 Gy
Plan Total Fractions Prescribed: 30
Plan Total Prescribed Dose: 60 Gy
Reference Point Dosage Given to Date: 22 Gy
Reference Point Session Dosage Given: 2 Gy
Session Number: 11

## 2021-12-14 ENCOUNTER — Ambulatory Visit
Admission: RE | Admit: 2021-12-14 | Discharge: 2021-12-14 | Disposition: A | Discharge status: Home / Self Care | Payer: Medicare HMO | Source: Ambulatory Visit | Attending: Radiation Oncology | Admitting: Radiation Oncology

## 2021-12-14 ENCOUNTER — Other Ambulatory Visit: Payer: Self-pay

## 2021-12-14 DIAGNOSIS — C3481 Malignant neoplasm of overlapping sites of right bronchus and lung: Secondary | ICD-10-CM | POA: Diagnosis not present

## 2021-12-14 DIAGNOSIS — Z510 Encounter for antineoplastic radiation therapy: Secondary | ICD-10-CM | POA: Diagnosis not present

## 2021-12-14 DIAGNOSIS — F17218 Nicotine dependence, cigarettes, with other nicotine-induced disorders: Secondary | ICD-10-CM | POA: Diagnosis not present

## 2021-12-14 LAB — RAD ONC ARIA SESSION SUMMARY
Course Elapsed Days: 15
Plan Fractions Treated to Date: 12
Plan Prescribed Dose Per Fraction: 2 Gy
Plan Total Fractions Prescribed: 30
Plan Total Prescribed Dose: 60 Gy
Reference Point Dosage Given to Date: 24 Gy
Reference Point Session Dosage Given: 2 Gy
Session Number: 12

## 2021-12-15 ENCOUNTER — Other Ambulatory Visit: Payer: Self-pay

## 2021-12-15 ENCOUNTER — Inpatient Hospital Stay: Payer: Medicare HMO | Admitting: Dietician

## 2021-12-15 ENCOUNTER — Ambulatory Visit
Admission: RE | Admit: 2021-12-15 | Discharge: 2021-12-15 | Disposition: A | Discharge status: Home / Self Care | Payer: Medicare HMO | Source: Ambulatory Visit | Attending: Radiation Oncology | Admitting: Radiation Oncology

## 2021-12-15 DIAGNOSIS — C3481 Malignant neoplasm of overlapping sites of right bronchus and lung: Secondary | ICD-10-CM | POA: Diagnosis not present

## 2021-12-15 DIAGNOSIS — Z510 Encounter for antineoplastic radiation therapy: Secondary | ICD-10-CM | POA: Diagnosis not present

## 2021-12-15 DIAGNOSIS — F17218 Nicotine dependence, cigarettes, with other nicotine-induced disorders: Secondary | ICD-10-CM | POA: Diagnosis not present

## 2021-12-15 LAB — RAD ONC ARIA SESSION SUMMARY
Course Elapsed Days: 16
Plan Fractions Treated to Date: 13
Plan Prescribed Dose Per Fraction: 2 Gy
Plan Total Fractions Prescribed: 30
Plan Total Prescribed Dose: 60 Gy
Reference Point Dosage Given to Date: 26 Gy
Reference Point Session Dosage Given: 2 Gy
Session Number: 13

## 2021-12-15 NOTE — Progress Notes (Signed)
Patient did not show for nutrition appointment. Will send message to scheduling to offer different appointment.

## 2021-12-16 ENCOUNTER — Ambulatory Visit
Admission: RE | Admit: 2021-12-16 | Discharge: 2021-12-16 | Disposition: A | Discharge status: Home / Self Care | Payer: Medicare HMO | Source: Ambulatory Visit | Attending: Radiation Oncology | Admitting: Radiation Oncology

## 2021-12-16 ENCOUNTER — Other Ambulatory Visit: Payer: Self-pay

## 2021-12-16 DIAGNOSIS — C3401 Malignant neoplasm of right main bronchus: Secondary | ICD-10-CM | POA: Diagnosis not present

## 2021-12-16 DIAGNOSIS — C3481 Malignant neoplasm of overlapping sites of right bronchus and lung: Secondary | ICD-10-CM | POA: Diagnosis not present

## 2021-12-16 DIAGNOSIS — F17218 Nicotine dependence, cigarettes, with other nicotine-induced disorders: Secondary | ICD-10-CM | POA: Diagnosis not present

## 2021-12-16 DIAGNOSIS — R110 Nausea: Secondary | ICD-10-CM | POA: Diagnosis not present

## 2021-12-16 DIAGNOSIS — Z510 Encounter for antineoplastic radiation therapy: Secondary | ICD-10-CM | POA: Diagnosis not present

## 2021-12-16 LAB — RAD ONC ARIA SESSION SUMMARY
Course Elapsed Days: 17
Plan Fractions Treated to Date: 14
Plan Prescribed Dose Per Fraction: 2 Gy
Plan Total Fractions Prescribed: 30
Plan Total Prescribed Dose: 60 Gy
Reference Point Dosage Given to Date: 28 Gy
Reference Point Session Dosage Given: 2 Gy
Session Number: 14

## 2021-12-17 ENCOUNTER — Other Ambulatory Visit: Payer: Self-pay

## 2021-12-17 ENCOUNTER — Ambulatory Visit
Admission: RE | Admit: 2021-12-17 | Discharge: 2021-12-17 | Disposition: A | Discharge status: Home / Self Care | Payer: Medicare HMO | Source: Ambulatory Visit | Attending: Radiation Oncology | Admitting: Radiation Oncology

## 2021-12-17 DIAGNOSIS — Z510 Encounter for antineoplastic radiation therapy: Secondary | ICD-10-CM | POA: Diagnosis not present

## 2021-12-17 DIAGNOSIS — F3132 Bipolar disorder, current episode depressed, moderate: Secondary | ICD-10-CM | POA: Diagnosis not present

## 2021-12-17 DIAGNOSIS — C3481 Malignant neoplasm of overlapping sites of right bronchus and lung: Secondary | ICD-10-CM | POA: Diagnosis not present

## 2021-12-17 DIAGNOSIS — F17218 Nicotine dependence, cigarettes, with other nicotine-induced disorders: Secondary | ICD-10-CM | POA: Diagnosis not present

## 2021-12-17 LAB — RAD ONC ARIA SESSION SUMMARY
Course Elapsed Days: 18
Plan Fractions Treated to Date: 15
Plan Prescribed Dose Per Fraction: 2 Gy
Plan Total Fractions Prescribed: 30
Plan Total Prescribed Dose: 60 Gy
Reference Point Dosage Given to Date: 30 Gy
Reference Point Session Dosage Given: 2 Gy
Session Number: 15

## 2021-12-17 MED FILL — Dexamethasone Sodium Phosphate Inj 100 MG/10ML: INTRAMUSCULAR | Qty: 1 | Status: AC

## 2021-12-17 MED FILL — Fosaprepitant Dimeglumine For IV Infusion 150 MG (Base Eq): INTRAVENOUS | Qty: 5 | Status: AC

## 2021-12-20 ENCOUNTER — Other Ambulatory Visit: Payer: Self-pay

## 2021-12-20 ENCOUNTER — Inpatient Hospital Stay: Payer: Medicare HMO

## 2021-12-20 ENCOUNTER — Inpatient Hospital Stay (HOSPITAL_BASED_OUTPATIENT_CLINIC_OR_DEPARTMENT_OTHER): Payer: Medicare HMO | Admitting: Internal Medicine

## 2021-12-20 ENCOUNTER — Other Ambulatory Visit: Payer: Self-pay | Admitting: Medical Oncology

## 2021-12-20 ENCOUNTER — Ambulatory Visit
Admission: RE | Admit: 2021-12-20 | Discharge: 2021-12-20 | Disposition: A | Discharge status: Home / Self Care | Payer: Medicare HMO | Source: Ambulatory Visit | Attending: Radiation Oncology | Admitting: Radiation Oncology

## 2021-12-20 ENCOUNTER — Encounter: Payer: Self-pay | Admitting: Internal Medicine

## 2021-12-20 ENCOUNTER — Other Ambulatory Visit: Payer: Self-pay | Admitting: Internal Medicine

## 2021-12-20 VITALS — BP 100/70 | HR 92 | Temp 98.2°F | Resp 17 | Wt 180.5 lb

## 2021-12-20 DIAGNOSIS — C3431 Malignant neoplasm of lower lobe, right bronchus or lung: Secondary | ICD-10-CM | POA: Diagnosis not present

## 2021-12-20 DIAGNOSIS — D649 Anemia, unspecified: Secondary | ICD-10-CM

## 2021-12-20 DIAGNOSIS — C3481 Malignant neoplasm of overlapping sites of right bronchus and lung: Secondary | ICD-10-CM

## 2021-12-20 DIAGNOSIS — Z510 Encounter for antineoplastic radiation therapy: Secondary | ICD-10-CM | POA: Diagnosis not present

## 2021-12-20 DIAGNOSIS — Z5111 Encounter for antineoplastic chemotherapy: Secondary | ICD-10-CM

## 2021-12-20 DIAGNOSIS — F17218 Nicotine dependence, cigarettes, with other nicotine-induced disorders: Secondary | ICD-10-CM | POA: Diagnosis not present

## 2021-12-20 DIAGNOSIS — D509 Iron deficiency anemia, unspecified: Secondary | ICD-10-CM

## 2021-12-20 DIAGNOSIS — I878 Other specified disorders of veins: Secondary | ICD-10-CM

## 2021-12-20 LAB — CBC WITH DIFFERENTIAL (CANCER CENTER ONLY)
Abs Immature Granulocytes: 0.01 10*3/uL (ref 0.00–0.07)
Basophils Absolute: 0 10*3/uL (ref 0.0–0.1)
Basophils Relative: 0 %
Eosinophils Absolute: 0.1 10*3/uL (ref 0.0–0.5)
Eosinophils Relative: 4 %
HCT: 23 % — ABNORMAL LOW (ref 39.0–52.0)
Hemoglobin: 7.3 g/dL — ABNORMAL LOW (ref 13.0–17.0)
Immature Granulocytes: 0 %
Lymphocytes Relative: 21 %
Lymphs Abs: 0.6 10*3/uL — ABNORMAL LOW (ref 0.7–4.0)
MCH: 25.1 pg — ABNORMAL LOW (ref 26.0–34.0)
MCHC: 31.7 g/dL (ref 30.0–36.0)
MCV: 79 fL — ABNORMAL LOW (ref 80.0–100.0)
Monocytes Absolute: 0.4 10*3/uL (ref 0.1–1.0)
Monocytes Relative: 13 %
Neutro Abs: 1.9 10*3/uL (ref 1.7–7.7)
Neutrophils Relative %: 62 %
Platelet Count: 120 10*3/uL — ABNORMAL LOW (ref 150–400)
RBC: 2.91 MIL/uL — ABNORMAL LOW (ref 4.22–5.81)
RDW: 17.4 % — ABNORMAL HIGH (ref 11.5–15.5)
WBC Count: 3 10*3/uL — ABNORMAL LOW (ref 4.0–10.5)
nRBC: 0 % (ref 0.0–0.2)

## 2021-12-20 LAB — RAD ONC ARIA SESSION SUMMARY
Course Elapsed Days: 21
Plan Fractions Treated to Date: 16
Plan Prescribed Dose Per Fraction: 2 Gy
Plan Total Fractions Prescribed: 30
Plan Total Prescribed Dose: 60 Gy
Reference Point Dosage Given to Date: 32 Gy
Reference Point Session Dosage Given: 2 Gy
Session Number: 16

## 2021-12-20 LAB — CMP (CANCER CENTER ONLY)
ALT: 9 U/L (ref 0–44)
AST: 14 U/L — ABNORMAL LOW (ref 15–41)
Albumin: 3 g/dL — ABNORMAL LOW (ref 3.5–5.0)
Alkaline Phosphatase: 198 U/L — ABNORMAL HIGH (ref 38–126)
Anion gap: 9 (ref 5–15)
BUN: 6 mg/dL — ABNORMAL LOW (ref 8–23)
CO2: 29 mmol/L (ref 22–32)
Calcium: 6.3 mg/dL — CL (ref 8.9–10.3)
Chloride: 101 mmol/L (ref 98–111)
Creatinine: 0.61 mg/dL (ref 0.61–1.24)
GFR, Estimated: >60 mL/min (ref >60)
Glucose, Bld: 104 mg/dL — ABNORMAL HIGH (ref 70–99)
Potassium: 4.2 mmol/L (ref 3.5–5.1)
Sodium: 139 mmol/L (ref 135–145)
Total Bilirubin: 0.3 mg/dL (ref 0.3–1.2)
Total Protein: 6.2 g/dL — ABNORMAL LOW (ref 6.5–8.1)

## 2021-12-20 LAB — PREPARE RBC (CROSSMATCH)

## 2021-12-20 LAB — SAMPLE TO BLOOD BANK

## 2021-12-20 LAB — ABO/RH: ABO/RH(D): O POS

## 2021-12-20 MED ORDER — CETIRIZINE HCL 10 MG/ML IV SOLN
10.0000 mg | Freq: Once | INTRAVENOUS | Status: AC
  Administered 2021-12-20: 10 mg via INTRAVENOUS
  Filled 2021-12-20: qty 1

## 2021-12-20 MED ORDER — SODIUM CHLORIDE 0.9 % IV SOLN
150.0000 mg | Freq: Once | INTRAVENOUS | Status: AC
  Administered 2021-12-20: 150 mg via INTRAVENOUS
  Filled 2021-12-20: qty 150

## 2021-12-20 MED ORDER — SODIUM CHLORIDE 0.9 % IV SOLN: Freq: Once | INTRAVENOUS | Status: AC

## 2021-12-20 MED ORDER — SODIUM CHLORIDE 0.9 % IV SOLN
10.0000 mg | Freq: Once | INTRAVENOUS | Status: AC
  Administered 2021-12-20: 10 mg via INTRAVENOUS
  Filled 2021-12-20: qty 10

## 2021-12-20 MED ORDER — OYSTER SHELL CALCIUM/D3 500-5 MG-MCG PO TABS: 1.0000 | ORAL_TABLET | Freq: Two times a day (BID) | ORAL | 2 refills | Status: DC

## 2021-12-20 MED ORDER — OXYCODONE HCL 5 MG PO TABS
5.0000 mg | ORAL_TABLET | Freq: Once | ORAL | Status: AC
  Administered 2021-12-20: 5 mg via ORAL
  Filled 2021-12-20: qty 1

## 2021-12-20 MED ORDER — SODIUM CHLORIDE 0.9 % IV SOLN
45.0000 mg/m2 | Freq: Once | INTRAVENOUS | Status: AC
  Administered 2021-12-20: 90 mg via INTRAVENOUS
  Filled 2021-12-20: qty 15

## 2021-12-20 MED ORDER — ACETAMINOPHEN 325 MG PO TABS
325.0000 mg | ORAL_TABLET | Freq: Once | ORAL | Status: AC
  Administered 2021-12-20: 325 mg via ORAL
  Filled 2021-12-20: qty 1

## 2021-12-20 MED ORDER — SODIUM CHLORIDE 0.9% IV SOLUTION: 250.0000 mL | Freq: Once | INTRAVENOUS | Status: DC

## 2021-12-20 MED ORDER — SODIUM CHLORIDE 0.9 % IV SOLN
214.2000 mg | Freq: Once | INTRAVENOUS | Status: AC
  Administered 2021-12-20: 210 mg via INTRAVENOUS
  Filled 2021-12-20: qty 21

## 2021-12-20 MED ORDER — SODIUM CHLORIDE 0.9 % IV SOLN
2.0000 g | Freq: Once | INTRAVENOUS | Status: AC
  Administered 2021-12-20: 2 g via INTRAVENOUS
  Filled 2021-12-20: qty 20

## 2021-12-20 MED ORDER — FAMOTIDINE IN NACL 20-0.9 MG/50ML-% IV SOLN
20.0000 mg | Freq: Once | INTRAVENOUS | Status: AC
  Administered 2021-12-20: 20 mg via INTRAVENOUS
  Filled 2021-12-20: qty 50

## 2021-12-20 NOTE — Progress Notes (Signed)
[name]   [phone]  OFFICE PROGRESS NOTE  [name], MD [address] [place] [address]  DIAGNOSIS: Stage IIIB (T3, N2, M0) non-small cell lung cancer, squamous cell carcinoma presented with large central obstructive right lower lobe lung mass with right paratracheal lymphadenopathy.  The patient also had L1 compression fracture that could be pathologic in nature as well as left lower lobe pulmonary nodule.  PDL1 Expression: 5%  PRIOR THERAPY: None  CURRENT THERAPY: Concurrent chemoradiation with weekly carboplatin for AUC of 2 and paclitaxel 45 Mg/M2.  First dose November 29, 2021.  Status post 2 cycles.  INTERVAL HISTORY: [name] 66 y.o. male returns to the clinic today for follow-up visit accompanied by his caregiver.  The patient is feeling fine today with no concerning complaints except for fatigue and occasional nausea.  He denied having any current chest pain, shortness of breath, cough or hemoptysis.  He has no vomiting, abdominal pain, diarrhea or constipation.  He has no headache or visual changes.  He has no fever or chills.  He has been tolerating his treatment with concurrent chemoradiation fairly well.  He is here today for evaluation before starting cycle #3.   MEDICAL HISTORY: Past Medical History:  Diagnosis Date   Anginal pain ([facility])    Anxiety    Arthritis    "right hip; herniated L4-5" (11/05/2015)   Asthma    Bipolar 1 disorder (HCC)    CHF (congestive heart failure) (HCC)    Chronic bronchitis (HCC)    Chronic lower back pain    Chronic pain disorder    Community acquired pneumonia 10/30/2016   COPD (chronic obstructive pulmonary disease) ([facility])    Coronary artery disease    Coronary atherosclerosis 12/05/2008   Qualifier: Diagnosis of  By: Jaramillo, [place], HX OF 12/05/2008   Qualifier: Diagnosis of  By: Jaramillo, [place], MAJOR  12/05/2008   Qualifier: Diagnosis of  By: Sidney Ace     Diabetes mellitus type 2 with complications ([facility]) 7/86/7672   Diverticulitis large intestine    DVT (deep venous thrombosis) ([facility]) 1991   "left calf; after 1st knee scope"   EROSIVE GASTRITIS 12/05/2008   Qualifier: Diagnosis of  By: Sidney Ace     Fungal esophagitis    GERD 12/05/2008   Qualifier: Diagnosis of  By: Sidney Ace     GERD (gastroesophageal reflux disease)    Heart attack ([facility]) 2010   BMS x 2 RCA   High cholesterol    Hyperlipidemia    Hypertension    Hypothyroidism    MRSA (methicillin resistant staph aureus) culture positive    Myocardial infarction ([facility])    MYOCARDIAL INFARCTION, HX OF 12/05/2008   Qualifier: Diagnosis of  By: Sidney Ace     Obesity 12/05/2008   Qualifier: Diagnosis of  By: Sidney Ace     Pneumonia    "several times"   PULMONARY NODULE, LEFT LOWER LOBE 12/05/2008   Qualifier: Diagnosis of  By: Sidney Ace     S/P angioplasty with stent 11/05/15 PCI DES to mLAD  11/06/2015   Schizo affective schizophrenia ([facility]) 2002   SOB (shortness of breath) 08/22/2016   Status post cholecystectomy 09/25/2014    ALLERGIES:  is allergic to rocephin [ceftriaxone], zofran [ondansetron hcl], ampicillin, lipitor [atorvastatin], toradol [ketorolac tromethamine], and tramadol.  MEDICATIONS:  Current Outpatient Medications  Medication Sig  Dispense Refill   acetaminophen (TYLENOL) 325 MG tablet Take 650 mg by mouth every 6 (six) hours as needed (pain.).     albuterol (PROVENTIL) (2.5 MG/3ML) 0.083% nebulizer solution Take 2.5 mg by nebulization every 6 (six) hours as needed for wheezing or shortness of breath.     albuterol (VENTOLIN HFA) 108 (90 Base) MCG/ACT inhaler Inhale 2 puffs into the lungs every 6 (six) hours as needed for wheezing or shortness of breath.     allopurinol (ZYLOPRIM) 100 MG tablet Take 100 mg by mouth daily. (0900)     aspirin EC 81 MG tablet Take 1 tablet (81 mg total)  by mouth daily. Swallow whole. 90 tablet 3   cyclobenzaprine (FLEXERIL) 10 MG tablet Take 10 mg by mouth 3 (three) times daily. (0900, 1300 & 2100)     ferrous sulfate 325 (65 FE) MG tablet Take 325 mg by mouth in the morning and at bedtime. (0800 & 1600)     Fluticasone-Umeclidin-Vilant (TRELEGY ELLIPTA) 200-62.5-25 MCG/ACT AEPB Inhale 1 puff into the lungs daily. 14 each 0   furosemide (LASIX) 20 MG tablet Take 20 mg by mouth in the morning. (0900)     gabapentin (NEURONTIN) 400 MG capsule Take 400 mg by mouth 3 (three) times daily. (0900, 1300 & 2100)     Glucagon, rDNA, (GLUCAGON EMERGENCY) 1 MG KIT Inject 1 mg as directed as needed (hypoglycemia).     HUMALOG KWIKPEN 100 UNIT/ML KwikPen Inject 0-10 Units into the skin in the morning, at noon, in the evening, and at bedtime. Per Sliding Scale: 0-150=0 units 151-200=2 units 201-250=4 units 251-350=8 units 351-400=10 units     ipratropium-albuterol (DUONEB) 0.5-2.5 (3) MG/3ML SOLN Inhale 3 mLs into the lungs every 4 (four) hours as needed (wheezing/COPD).     levofloxacin (LEVAQUIN) 750 MG tablet Take 1 tablet (750 mg total) by mouth daily. 10 tablet 0   levothyroxine (SYNTHROID) 75 MCG tablet Take 75 mcg by mouth daily before breakfast.     LORazepam (ATIVAN) 2 MG tablet Take 2 mg by mouth every 12 (twelve) hours. (0800 & 2000)     metFORMIN (GLUCOPHAGE) 500 MG tablet Take 500 mg by mouth 2 (two) times daily with a meal. (0900 & 1700)     metoprolol succinate (TOPROL-XL) 25 MG 24 hr tablet Take 25 mg by mouth in the morning. (0900)     omeprazole (PRILOSEC) 20 MG capsule Take 20 mg by mouth in the morning.     Oxycodone HCl 10 MG TABS Take 10 mg by mouth every 6 (six) hours as needed (pain).     polyethylene glycol (MIRALAX / GLYCOLAX) packet Take 17 g by mouth daily as needed for mild constipation.      pravastatin (PRAVACHOL) 80 MG tablet Take 80 mg by mouth at bedtime. (2000)     prochlorperazine (COMPAZINE) 10 MG tablet Take 1 tablet  (10 mg total) by mouth every 6 (six) hours as needed for nausea or vomiting. 30 tablet 2   QUEtiapine (SEROQUEL) 100 MG tablet Take 100 mg by mouth at bedtime. (2000)     saccharomyces boulardii (FLORASTOR) 250 MG capsule Take 250 mg by mouth 2 (two) times daily. (0900 & 2100)     temazepam (RESTORIL) 30 MG capsule Take 30 mg by mouth at bedtime. (2000)     therapeutic multivitamin-minerals (THERAGRAN-M) tablet Take 1 tablet by mouth in the morning. (0900)     venlafaxine (EFFEXOR) 75 MG tablet Take 75 mg by mouth in the  morning. (0900)     No current facility-administered medications for this visit.    SURGICAL HISTORY:  Past Surgical History:  Procedure Laterality Date   APPENDECTOMY     BIOPSY  11/16/2021   Procedure: BIOPSY;  Surgeon: Rigoberto Noel, MD;  Location: [facility];  Service: Cardiopulmonary;;   BRONCHIAL BRUSHINGS  11/16/2021   Procedure: BRONCHIAL BRUSHINGS;  Surgeon: Rigoberto Noel, MD;  Location: [facility];  Service: Cardiopulmonary;;   BRONCHIAL WASHINGS  11/16/2021   Procedure: BRONCHIAL WASHINGS;  Surgeon: Rigoberto Noel, MD;  Location: [facility];  Service: Cardiopulmonary;;   CARDIAC CATHETERIZATION N/A 11/05/2015   Procedure: Left Heart Cath and Coronary Angiography;  Surgeon: Nelva Bush, MD;  Location: [facility];  Service: Cardiovascular;  Laterality: N/A;   CARDIAC CATHETERIZATION N/A 11/05/2015   Procedure: Coronary Stent Intervention;  Surgeon: Nelva Bush, MD;  Location: [facility];  Service: Cardiovascular;  Laterality: N/A;   CARDIAC CATHETERIZATION N/A 11/05/2015   Procedure: Intravascular Pressure Wire/FFR Study;  Surgeon: Nelva Bush, MD;  Location: [facility];  Service: Cardiovascular;  Laterality: N/A;   CORONARY ANGIOPLASTY WITH STENT PLACEMENT  2010   90% RCA s/p BMS x 2, 50% LAD, 30% CFX, EF 45-50%   CORONARY ANGIOPLASTY WITH STENT PLACEMENT  11/05/2015   "1 today; makes a total of 4 stents" (11/05/2015)    ENDOBRONCHIAL ULTRASOUND Bilateral 11/16/2021   Procedure: ENDOBRONCHIAL ULTRASOUND;  Surgeon: Rigoberto Noel, MD;  Location: [facility];  Service: Cardiopulmonary;  Laterality: Bilateral;   FINE NEEDLE ASPIRATION BIOPSY  11/16/2021   Procedure: FINE NEEDLE ASPIRATION BIOPSY;  Surgeon: Rigoberto Noel, MD;  [facility];  Service: Cardiopulmonary;;   HEMOSTASIS CONTROL  11/16/2021   Procedure: HEMOSTASIS CONTROL;  Surgeon: Rigoberto Noel, MD;  Location: [facility];  Service: Cardiopulmonary;;   INGUINAL HERNIA REPAIR Right    KNEE ARTHROSCOPY Left X 3   LAPAROSCOPIC CHOLECYSTECTOMY     TONSILLECTOMY AND ADENOIDECTOMY     UPPER GI ENDOSCOPY  05/24/2016   UGI ENDO INCLUDE ESOPHAGUS, STOMACH & DUODENUM &/OR JEJUNUM; DX W/WO COLLECTION SPECIMEN BY BRUSH OR [place]; SURGEON :ALBERT San Morelle, MD LOCATION; [facility]    REVIEW OF SYSTEMS:  Constitutional: positive for fatigue Eyes: negative Ears, nose, mouth, throat, and face: negative Respiratory: negative Cardiovascular: negative Gastrointestinal: positive for nausea Genitourinary:negative Integument/breast: negative Hematologic/lymphatic: negative Musculoskeletal:positive for back pain and muscle weakness Neurological: negative Behavioral/Psych: negative Endocrine: negative Allergic/Immunologic: negative   PHYSICAL EXAMINATION: General appearance: alert, cooperative, fatigued, and no distress Head: Normocephalic, without obvious abnormality, atraumatic Neck: no adenopathy, no JVD, supple, symmetrical, trachea midline, and thyroid not enlarged, symmetric, no tenderness/mass/nodules Lymph nodes: Cervical, supraclavicular, and axillary nodes normal. Resp: clear to auscultation bilaterally Back: symmetric, no curvature. ROM normal. No CVA tenderness. Cardio: regular rate and rhythm, S1, S2 normal, no murmur, click, rub or gallop GI: soft, non-tender; bowel sounds normal; no masses,  no organomegaly Extremities:  extremities normal, atraumatic, no cyanosis or edema Neurologic: Alert and oriented X 3, normal strength and tone. Normal symmetric reflexes. Normal coordination and gait  ECOG PERFORMANCE STATUS: 1 - Symptomatic but completely ambulatory  Blood pressure 100/70, pulse 92, temperature 98.2 F (36.8 C), temperature source Oral, resp. rate 17, weight 180 lb 8 oz (81.9 kg), SpO2 94 %.  LABORATORY DATA: Lab Results  Component Value Date   WBC 3.0 (L) 12/20/2021   HGB 7.3 (L) 12/20/2021   HCT 23.0 (L) 12/20/2021   MCV 79.0 (L)  12/20/2021   PLT 120 (L) 12/20/2021      Chemistry      Component Value Date/Time   NA 136 12/07/2021 0904   NA 138 01/21/2017 0000   K 3.8 12/07/2021 0904   CL 99 12/07/2021 0904   CO2 28 12/07/2021 0904   BUN 6 (L) 12/07/2021 0904   BUN 16 01/21/2017 0000   CREATININE 0.55 (L) 12/07/2021 0904   GLU 103 10/25/2016 0000      Component Value Date/Time   CALCIUM 7.4 (L) 12/07/2021 0904   ALKPHOS 825 (H) 12/07/2021 0904   AST 68 (H) 12/07/2021 0904   ALT 42 12/07/2021 0904   BILITOT 0.5 12/07/2021 0904       RADIOGRAPHIC STUDIES: ECHOCARDIOGRAM COMPLETE  Result Date: 11/30/2021    ECHOCARDIOGRAM REPORT   Patient Name:   [name] Date of Exam: 11/26/2021 Medical Rec #:  [id number]      Height:       72.0 in Accession #:    [id number]     Weight:       212.0 lb Date of Birth:  1956/01/31      BSA:          2.184 m Patient Age:    37 years       BP:           113/70 mmHg Patient Gender: M              HR:           87 bpm. Exam Location:  [facility] Procedure: 2D Echo, Cardiac Doppler, Color Doppler and Intracardiac            Opacification Agent Indications:    I25.10 CAD  History:        Patient has prior history of Echocardiogram examinations, most                 recent 11/19/2008. CAD and Previous Myocardial Infarction, COPD;                 Risk Factors:Hypertension, Dyslipidemia, Current Smoker,                 Diabetes and Obesity.  Sonographer:     Coralyn Helling RDCS Referring Phys: [id number] [place]  Sonographer Comments: Technically difficult study due to poor echo windows, suboptimal parasternal window and Technically challenging study due to limited acoustic windows. Image acquisition challenging due to patient body habitus and Image acquisition challenging due to COPD. IMPRESSIONS  1. Left ventricular ejection fraction, by estimation, is 55 to 60%. The left ventricle has normal function. Left ventricular endocardial border not optimally defined to evaluate regional wall motion. Left ventricular diastolic parameters are indeterminate.  2. Right ventricular systolic function is normal. The right ventricular size is normal.  3. The mitral valve is normal in structure. Trivial mitral valve regurgitation.  4. The aortic valve was not well visualized. Aortic valve regurgitation is not visualized.  5. Very limited study due to poor sound wave transmission. Overall LV function looks normal, in some images there appears to be hypokinesis of the anterosept wall but this is not well seen even with Definity. There appears to be a prominent epicardial fat pad. FINDINGS  Left Ventricle: Left ventricular ejection fraction, by estimation, is 55 to 60%. The left ventricle has normal function. Left ventricular endocardial border not optimally defined to evaluate regional wall motion. Definity contrast agent was given IV to delineate the left ventricular endocardial  borders. The left ventricular internal cavity size was normal in size. Suboptimal image quality limits for assessment of left ventricular hypertrophy. Left ventricular diastolic parameters are indeterminate. Right Ventricle: The right ventricular size is normal. No increase in right ventricular wall thickness. Right ventricular systolic function is normal. Left Atrium: Left atrial size was normal in size. Right Atrium: Right atrial size was normal in size. Pericardium: There is no evidence of  pericardial effusion. Mitral Valve: The mitral valve is normal in structure. Trivial mitral valve regurgitation. Tricuspid Valve: The tricuspid valve is not well visualized. Tricuspid valve regurgitation is trivial. Aortic Valve: The aortic valve was not well visualized. Aortic valve regurgitation is not visualized. Pulmonic Valve: The pulmonic valve was not well visualized. Pulmonic valve regurgitation is not visualized. Aorta: The aortic root and ascending aorta are structurally normal, with no evidence of dilitation. IAS/Shunts: The interatrial septum was not assessed.  LEFT VENTRICLE PLAX 2D LVIDd:         4.20 cm LVIDs:         3.10 cm LV PW:         1.00 cm LV IVS:        1.00 cm LVOT diam:     2.00 cm LV SV:         46 LV SV Index:   21 LVOT Area:     3.14 cm  IVC IVC diam: 1.30 cm LEFT ATRIUM             Index        RIGHT ATRIUM           Index LA diam:        3.10 cm 1.42 cm/m   RA Pressure: 3.00 mmHg LA Vol (A2C):   51.8 ml 23.72 ml/m  RA Area:     12.60 cm LA Vol (A4C):   41.7 ml 19.09 ml/m  RA Volume:   27.70 ml  12.68 ml/m LA Biplane Vol: 47.0 ml 21.52 ml/m  AORTIC VALVE LVOT Vmax:   85.40 cm/s LVOT Vmean:  57.000 cm/s LVOT VTI:    0.148 m  AORTA Ao Root diam: 3.30 cm MITRAL VALVE                TRICUSPID VALVE MV Area (PHT): 3.72 cm     Estimated RAP:  3.00 mmHg MV Decel Time: 204 msec MV E velocity: 110.00 cm/s  SHUNTS MV A velocity: 93.30 cm/s   Systemic VTI:  0.15 m MV E/A ratio:  1.18         Systemic Diam: 2.00 cm Glori Bickers MD Electronically signed by Glori Bickers MD Signature Date/Time: 11/30/2021/2:55:23 PM    Final    MR Brain W Wo Contrast  Result Date: 11/28/2021 CLINICAL DATA:  Non-small cell lung cancer staging. EXAM: MRI HEAD WITHOUT AND WITH CONTRAST TECHNIQUE: Multiplanar, multiecho pulse sequences of the brain and surrounding structures were obtained without and with intravenous contrast. CONTRAST:  9.5 cc of vueway intravenous COMPARISON:  None Available.  FINDINGS: Brain: No abnormal enhancement or swelling to suggest metastatic disease. Mild cerebral volume loss and chronic small vessel ischemia in the hemispheric white matter. No acute or subacute infarct, hydrocephalus, collection, or swelling. Vascular: Major flow voids and vascular enhancements are preserved Skull and upper cervical spine: Normal marrow signal Sinuses/Orbits: Negative Other: Unavoidable motion artifact due to mandibular motion. IMPRESSION: 1. Negative for metastatic disease. 2. Unavoidable motion artifact. Electronically Signed   By: Jorje Guild M.D.   On:  11/28/2021 11:16    ASSESSMENT AND PLAN: This is a very pleasant 66 years old white male recently diagnosed with a stage IIIb (T3, N2, M0) non-small cell cancer, squamous cell carcinoma presented with large right lower lobe lung mass in addition to mediastinal lymphadenopathy diagnosed in September 2023. His PD-L1 expression is 5%. The patient started a course of concurrent chemoradiation with weekly carboplatin for AUC of 2 and paclitaxel 45 Mg/M2 status post 2 cycles.  He has been tolerating this treatment well except for fatigue and mild nausea. I recommended for the patient to proceed with cycle #3 today as planned. For the anemia, we will arrange for the patient to receive 2 units of PRBCs transfusion.  He will also continue on the oral iron tablets. For the nausea, the patient will continue his treatment with Compazine on as-needed basis. He will come back for follow-up visit in 2 weeks for evaluation before starting cycle #5. For the hypocalcemia, we will arrange for the patient to receive calcium gluconate in the clinic and we will send calcium supplement to his pharmacy. He was advised to call immediately if he has any other concerning symptoms in the interval. The patient voices understanding of current disease status and treatment options and is in agreement with the current care plan.  All questions were answered.  The patient knows to call the clinic with any problems, questions or concerns. We can certainly see the patient much sooner if necessary.  The total time spent in the appointment was 30 minutes.  Disclaimer: This note was dictated with voice recognition software. Similar sounding words can inadvertently be transcribed and may not be corrected upon review.

## 2021-12-20 NOTE — Addendum Note (Signed)
Addended by: Ardeen Garland on: 12/20/2021 10:19 AM   Modules accepted: Orders

## 2021-12-20 NOTE — Progress Notes (Signed)
OK to treat-Per Dr. Julien Nordmann it is okay to treat pt today with  Carboplatin and Paclitaxel with Hgb of 7.3 .

## 2021-12-21 ENCOUNTER — Inpatient Hospital Stay: Payer: Medicare HMO

## 2021-12-21 ENCOUNTER — Other Ambulatory Visit: Payer: Self-pay

## 2021-12-21 ENCOUNTER — Telehealth: Payer: Self-pay | Admitting: Medical Oncology

## 2021-12-21 ENCOUNTER — Inpatient Hospital Stay: Payer: Medicare HMO | Admitting: Nutrition

## 2021-12-21 ENCOUNTER — Ambulatory Visit
Admission: RE | Admit: 2021-12-21 | Discharge: 2021-12-21 | Disposition: A | Discharge status: Home / Self Care | Payer: Medicare HMO | Source: Ambulatory Visit | Attending: Radiation Oncology | Admitting: Radiation Oncology

## 2021-12-21 DIAGNOSIS — C3481 Malignant neoplasm of overlapping sites of right bronchus and lung: Secondary | ICD-10-CM | POA: Diagnosis not present

## 2021-12-21 DIAGNOSIS — Z510 Encounter for antineoplastic radiation therapy: Secondary | ICD-10-CM | POA: Diagnosis not present

## 2021-12-21 DIAGNOSIS — F17218 Nicotine dependence, cigarettes, with other nicotine-induced disorders: Secondary | ICD-10-CM | POA: Diagnosis not present

## 2021-12-21 DIAGNOSIS — D649 Anemia, unspecified: Secondary | ICD-10-CM

## 2021-12-21 LAB — RAD ONC ARIA SESSION SUMMARY
Course Elapsed Days: 22
Plan Fractions Treated to Date: 17
Plan Prescribed Dose Per Fraction: 2 Gy
Plan Total Fractions Prescribed: 30
Plan Total Prescribed Dose: 60 Gy
Reference Point Dosage Given to Date: 34 Gy
Reference Point Session Dosage Given: 2 Gy
Session Number: 17

## 2021-12-21 MED ORDER — ACETAMINOPHEN 325 MG PO TABS
650.0000 mg | ORAL_TABLET | Freq: Once | ORAL | Status: AC
  Administered 2021-12-21: 650 mg via ORAL
  Filled 2021-12-21: qty 2

## 2021-12-21 MED ORDER — DIPHENHYDRAMINE HCL 25 MG PO CAPS
25.0000 mg | ORAL_CAPSULE | Freq: Once | ORAL | Status: AC
  Administered 2021-12-21: 25 mg via ORAL
  Filled 2021-12-21: qty 1

## 2021-12-21 NOTE — Progress Notes (Signed)
66 year old male diagnosed with lung cancer and followed by Dr. Julien Nordmann.  CURRENT THERAPY: Concurrent chemoradiation with weekly carboplatin for AUC of 2 and paclitaxel 45 Mg/M2.  First dose November 29, 2021.  Status post 2 cycles.  Past medical history includes bipolar, COPD, CAD, depression, DM 2, diverticulitis, GERD, MI, and schizoaffective disorder.  Medications include ferrous sulfate, Lasix, Humalog, Synthroid, Ativan, metformin, Prilosec, MiraLAX, Compazine, Florastor, and multivitamin.  Labs include glucose 104, BUN 6, albumin 3.0.  Height: 6 feet. Weight: 180 pounds 8 ounces. Patient unsure of usual body weight BMI: 24.48.  Caregiver is not present with patient during infusion.  Patient states he knows he has lost weight.  States he has never been this weight before and reported weighing 220 pounds in high school when he played football.  He has had some nausea which has improved with Phenergan.  He currently resides at [facility].  He reports food is not always what he would like.  Reports he usually does not eat breakfast but drinks 2 cartons of milk and 2 juices.  He would like to have V8 juice on his meal trays.  He used to have snacks delivered but now he just gets crackers.  Patient dislikes/refuses boost and Ensure.  States he has had difficulty talking with the dietitian at [facility].  Nutrition diagnosis: Food and nutrition related knowledge deficit related to lung cancer and associated treatments as evidenced by no prior need for nutrition related information.  Intervention: Provided education on high calcium foods and provided nutrition fact sheet. Encourage patient to find a staff member and request preferences on meal trays and between meals.  Suggested he request double portions of foods he enjoys. Provided nutrition fact sheet on high-calorie, high-protein foods and snacks. Will attempt to contact RD at nursing home and request patient preferences be obtained and  provided.  Monitoring, evaluation, goals: Patient will tolerate increased calories and protein to minimize weight loss.  Next visit: To be scheduled as needed.  **Disclaimer: This note was dictated with voice recognition software. Similar sounding words can inadvertently be transcribed and this note may contain transcription errors which may not have been corrected upon publication of note.**

## 2021-12-21 NOTE — Telephone Encounter (Signed)
Dtr. [name] returned my call about pt appt.   Negative family dynamics [name] emphatically told me she does not want to be contacted for anything related to him except if he is unable to make medical decisions about  his care.  She said there is a lot of negative history between them of missed opportunities  and she does not see or talk to him .   LVM @ [place] re appt date and for staff to call me back . ASAP.

## 2021-12-22 ENCOUNTER — Ambulatory Visit: Payer: Medicare HMO

## 2021-12-22 DIAGNOSIS — C3481 Malignant neoplasm of overlapping sites of right bronchus and lung: Secondary | ICD-10-CM | POA: Insufficient documentation

## 2021-12-22 LAB — BPAM RBC
Blood Product Expiration Date: 202311262359
Blood Product Expiration Date: 202311282359
ISSUE DATE / TIME: 202310310835
ISSUE DATE / TIME: 202310310835
Unit Type and Rh: 5100
Unit Type and Rh: 5100

## 2021-12-22 LAB — TYPE AND SCREEN
ABO/RH(D): O POS
Antibody Screen: NEGATIVE
Unit division: 0
Unit division: 0

## 2021-12-23 ENCOUNTER — Other Ambulatory Visit: Payer: Self-pay

## 2021-12-23 ENCOUNTER — Ambulatory Visit
Admission: RE | Admit: 2021-12-23 | Discharge: 2021-12-23 | Disposition: A | Discharge status: Home / Self Care | Payer: Medicare HMO | Source: Ambulatory Visit | Attending: Radiation Oncology | Admitting: Radiation Oncology

## 2021-12-23 DIAGNOSIS — C3481 Malignant neoplasm of overlapping sites of right bronchus and lung: Secondary | ICD-10-CM | POA: Diagnosis not present

## 2021-12-23 DIAGNOSIS — Z510 Encounter for antineoplastic radiation therapy: Secondary | ICD-10-CM | POA: Diagnosis not present

## 2021-12-23 DIAGNOSIS — F3132 Bipolar disorder, current episode depressed, moderate: Secondary | ICD-10-CM | POA: Diagnosis not present

## 2021-12-23 DIAGNOSIS — F17218 Nicotine dependence, cigarettes, with other nicotine-induced disorders: Secondary | ICD-10-CM | POA: Diagnosis not present

## 2021-12-23 LAB — RAD ONC ARIA SESSION SUMMARY
Course Elapsed Days: 24
Plan Fractions Treated to Date: 18
Plan Prescribed Dose Per Fraction: 2 Gy
Plan Total Fractions Prescribed: 30
Plan Total Prescribed Dose: 60 Gy
Reference Point Dosage Given to Date: 36 Gy
Reference Point Session Dosage Given: 2 Gy
Session Number: 18

## 2021-12-24 ENCOUNTER — Ambulatory Visit: Payer: Medicare HMO

## 2021-12-24 ENCOUNTER — Other Ambulatory Visit: Payer: Self-pay

## 2021-12-24 DIAGNOSIS — C3401 Malignant neoplasm of right main bronchus: Secondary | ICD-10-CM | POA: Diagnosis not present

## 2021-12-24 DIAGNOSIS — R110 Nausea: Secondary | ICD-10-CM | POA: Diagnosis not present

## 2021-12-24 DIAGNOSIS — G894 Chronic pain syndrome: Secondary | ICD-10-CM | POA: Diagnosis not present

## 2021-12-24 DIAGNOSIS — Z7189 Other specified counseling: Secondary | ICD-10-CM | POA: Diagnosis not present

## 2021-12-24 MED FILL — Dexamethasone Sodium Phosphate Inj 100 MG/10ML: INTRAMUSCULAR | Qty: 1 | Status: AC

## 2021-12-24 MED FILL — Fosaprepitant Dimeglumine For IV Infusion 150 MG (Base Eq): INTRAVENOUS | Qty: 5 | Status: AC

## 2021-12-25 ENCOUNTER — Other Ambulatory Visit: Payer: Self-pay

## 2021-12-27 ENCOUNTER — Inpatient Hospital Stay: Payer: Medicare HMO

## 2021-12-27 ENCOUNTER — Ambulatory Visit: Payer: Medicare HMO

## 2021-12-27 DIAGNOSIS — G894 Chronic pain syndrome: Secondary | ICD-10-CM | POA: Diagnosis not present

## 2021-12-27 DIAGNOSIS — E1100 Type 2 diabetes mellitus with hyperosmolarity without nonketotic hyperglycemic-hyperosmolar coma (NKHHC): Secondary | ICD-10-CM | POA: Diagnosis not present

## 2021-12-27 DIAGNOSIS — C3401 Malignant neoplasm of right main bronchus: Secondary | ICD-10-CM | POA: Diagnosis not present

## 2021-12-28 ENCOUNTER — Ambulatory Visit: Payer: Medicare HMO

## 2021-12-28 ENCOUNTER — Other Ambulatory Visit: Payer: Self-pay

## 2021-12-28 DIAGNOSIS — F3132 Bipolar disorder, current episode depressed, moderate: Secondary | ICD-10-CM | POA: Diagnosis not present

## 2021-12-29 ENCOUNTER — Ambulatory Visit: Payer: Medicare HMO

## 2021-12-29 DIAGNOSIS — Z91199 Patient's noncompliance with other medical treatment and regimen due to unspecified reason: Secondary | ICD-10-CM | POA: Diagnosis not present

## 2021-12-29 DIAGNOSIS — F32A Depression, unspecified: Secondary | ICD-10-CM | POA: Diagnosis not present

## 2021-12-29 DIAGNOSIS — F209 Schizophrenia, unspecified: Secondary | ICD-10-CM | POA: Diagnosis not present

## 2021-12-29 DIAGNOSIS — J449 Chronic obstructive pulmonary disease, unspecified: Secondary | ICD-10-CM | POA: Diagnosis not present

## 2021-12-30 ENCOUNTER — Inpatient Hospital Stay (HOSPITAL_COMMUNITY): Admission: RE | Admit: 2021-12-30 | Payer: Medicare HMO | Source: Ambulatory Visit

## 2021-12-30 ENCOUNTER — Ambulatory Visit: Payer: Medicare HMO

## 2021-12-30 ENCOUNTER — Ambulatory Visit (HOSPITAL_COMMUNITY): Payer: Medicare HMO

## 2021-12-30 DIAGNOSIS — H93299 Other abnormal auditory perceptions, unspecified ear: Secondary | ICD-10-CM | POA: Diagnosis not present

## 2021-12-30 DIAGNOSIS — F419 Anxiety disorder, unspecified: Secondary | ICD-10-CM | POA: Diagnosis not present

## 2021-12-30 LAB — ACID FAST CULTURE WITH REFLEXED SENSITIVITIES (MYCOBACTERIA): Acid Fast Culture: NEGATIVE

## 2021-12-31 ENCOUNTER — Ambulatory Visit: Payer: Medicare HMO

## 2021-12-31 MED FILL — Fosaprepitant Dimeglumine For IV Infusion 150 MG (Base Eq): INTRAVENOUS | Qty: 5 | Status: AC

## 2021-12-31 MED FILL — Dexamethasone Sodium Phosphate Inj 100 MG/10ML: INTRAMUSCULAR | Qty: 1 | Status: AC

## 2022-01-02 ENCOUNTER — Other Ambulatory Visit: Payer: Self-pay

## 2022-01-03 ENCOUNTER — Inpatient Hospital Stay: Payer: Medicare HMO | Admitting: Physician Assistant

## 2022-01-03 ENCOUNTER — Ambulatory Visit: Payer: Medicare HMO

## 2022-01-03 ENCOUNTER — Other Ambulatory Visit: Payer: Self-pay | Admitting: Internal Medicine

## 2022-01-03 ENCOUNTER — Inpatient Hospital Stay: Payer: Medicare HMO

## 2022-01-03 ENCOUNTER — Encounter: Payer: Self-pay | Admitting: Radiation Oncology

## 2022-01-03 ENCOUNTER — Telehealth: Payer: Self-pay

## 2022-01-03 DIAGNOSIS — C3490 Malignant neoplasm of unspecified part of unspecified bronchus or lung: Secondary | ICD-10-CM | POA: Diagnosis not present

## 2022-01-03 DIAGNOSIS — F419 Anxiety disorder, unspecified: Secondary | ICD-10-CM | POA: Diagnosis not present

## 2022-01-03 DIAGNOSIS — G894 Chronic pain syndrome: Secondary | ICD-10-CM | POA: Diagnosis not present

## 2022-01-03 DIAGNOSIS — M2042 Other hammer toe(s) (acquired), left foot: Secondary | ICD-10-CM | POA: Diagnosis not present

## 2022-01-03 DIAGNOSIS — M2041 Other hammer toe(s) (acquired), right foot: Secondary | ICD-10-CM | POA: Diagnosis not present

## 2022-01-03 NOTE — Telephone Encounter (Signed)
This nurse reached out to the [facility] where this patient resides.  Spoke with the charge nurse related to appointments scheduled for today.  Was made aware that this patient has decided that he does not want to receive any more treatments, including radiation, imaging or office visits. They requests that all future appointments be cancelled.  The patient has decided to enter into hospice which they will be arranging for him.  There are no other questions or concerns noted at this time.

## 2022-01-03 NOTE — Progress Notes (Incomplete)
  Radiation Oncology         [phone] ________________________________  Name: [name] MRN: [id number]  Date: 01/03/2022  DOB: 02-10-1956  End of Treatment Note  Diagnosis:   ***     Indication for treatment::  palliative       Radiation treatment dates:   ***  Site/planned dose:   ***  Narrative: The patient's status declined during her course of radiation, and her radiotherapy was discontinued after *** treatments.  Plan: The patient decided to forgo additional radiotherapy and pursue hospice based care. We will follow up with him as needed.      Carola Rhine, PAC

## 2022-01-04 ENCOUNTER — Ambulatory Visit: Payer: Medicare HMO

## 2022-01-05 ENCOUNTER — Ambulatory Visit: Payer: Medicare HMO

## 2022-01-06 ENCOUNTER — Ambulatory Visit: Payer: Medicare HMO

## 2022-01-06 DIAGNOSIS — M79661 Pain in right lower leg: Secondary | ICD-10-CM | POA: Diagnosis not present

## 2022-01-06 DIAGNOSIS — F3132 Bipolar disorder, current episode depressed, moderate: Secondary | ICD-10-CM | POA: Diagnosis not present

## 2022-01-07 ENCOUNTER — Ambulatory Visit: Payer: Medicare HMO

## 2022-01-10 ENCOUNTER — Ambulatory Visit: Payer: Medicare HMO

## 2022-01-11 ENCOUNTER — Ambulatory Visit: Payer: Medicare HMO

## 2022-01-11 DIAGNOSIS — G894 Chronic pain syndrome: Secondary | ICD-10-CM | POA: Diagnosis not present

## 2022-01-11 DIAGNOSIS — C3490 Malignant neoplasm of unspecified part of unspecified bronchus or lung: Secondary | ICD-10-CM | POA: Diagnosis not present

## 2022-01-12 ENCOUNTER — Ambulatory Visit: Payer: Medicare HMO

## 2022-01-17 ENCOUNTER — Ambulatory Visit: Payer: Medicare HMO

## 2022-01-18 ENCOUNTER — Ambulatory Visit: Payer: Medicare HMO

## 2022-01-19 ENCOUNTER — Ambulatory Visit: Payer: Medicare HMO

## 2022-01-20 ENCOUNTER — Ambulatory Visit: Payer: Medicare HMO

## 2022-01-21 DIAGNOSIS — F329 Major depressive disorder, single episode, unspecified: Secondary | ICD-10-CM | POA: Diagnosis not present

## 2022-01-27 DIAGNOSIS — F3132 Bipolar disorder, current episode depressed, moderate: Secondary | ICD-10-CM | POA: Diagnosis not present

## 2022-01-28 DIAGNOSIS — G894 Chronic pain syndrome: Secondary | ICD-10-CM | POA: Diagnosis not present

## 2022-01-28 DIAGNOSIS — F319 Bipolar disorder, unspecified: Secondary | ICD-10-CM | POA: Diagnosis not present

## 2022-01-28 DIAGNOSIS — J449 Chronic obstructive pulmonary disease, unspecified: Secondary | ICD-10-CM | POA: Diagnosis not present

## 2022-01-28 DIAGNOSIS — C3491 Malignant neoplasm of unspecified part of right bronchus or lung: Secondary | ICD-10-CM | POA: Diagnosis not present

## 2022-02-01 DIAGNOSIS — J441 Chronic obstructive pulmonary disease with (acute) exacerbation: Secondary | ICD-10-CM | POA: Diagnosis not present

## 2022-02-01 DIAGNOSIS — R918 Other nonspecific abnormal finding of lung field: Secondary | ICD-10-CM | POA: Diagnosis not present

## 2022-02-01 DIAGNOSIS — C3490 Malignant neoplasm of unspecified part of unspecified bronchus or lung: Secondary | ICD-10-CM | POA: Diagnosis not present

## 2022-02-01 DIAGNOSIS — R059 Cough, unspecified: Secondary | ICD-10-CM | POA: Diagnosis not present

## 2022-02-01 DIAGNOSIS — F419 Anxiety disorder, unspecified: Secondary | ICD-10-CM | POA: Diagnosis not present

## 2022-02-01 DIAGNOSIS — I700 Atherosclerosis of aorta: Secondary | ICD-10-CM | POA: Diagnosis not present

## 2022-02-01 DIAGNOSIS — G4700 Insomnia, unspecified: Secondary | ICD-10-CM | POA: Diagnosis not present

## 2022-02-01 DIAGNOSIS — I517 Cardiomegaly: Secondary | ICD-10-CM | POA: Diagnosis not present

## 2022-02-02 ENCOUNTER — Emergency Department (HOSPITAL_COMMUNITY): Payer: Medicare HMO

## 2022-02-02 ENCOUNTER — Other Ambulatory Visit: Payer: Self-pay

## 2022-02-02 ENCOUNTER — Inpatient Hospital Stay (HOSPITAL_COMMUNITY)
Admission: EM | Admit: 2022-02-02 | Discharge: 2022-02-06 | DRG: 193 | Disposition: A | Discharge status: Hospice | Payer: Medicare HMO | Source: Skilled Nursing Facility | Attending: Internal Medicine | Admitting: Internal Medicine

## 2022-02-02 DIAGNOSIS — J189 Pneumonia, unspecified organism: Secondary | ICD-10-CM | POA: Diagnosis not present

## 2022-02-02 DIAGNOSIS — M1611 Unilateral primary osteoarthritis, right hip: Secondary | ICD-10-CM | POA: Diagnosis present

## 2022-02-02 DIAGNOSIS — M549 Dorsalgia, unspecified: Secondary | ICD-10-CM | POA: Diagnosis not present

## 2022-02-02 DIAGNOSIS — E039 Hypothyroidism, unspecified: Secondary | ICD-10-CM | POA: Diagnosis present

## 2022-02-02 DIAGNOSIS — I2510 Atherosclerotic heart disease of native coronary artery without angina pectoris: Secondary | ICD-10-CM | POA: Diagnosis present

## 2022-02-02 DIAGNOSIS — I10 Essential (primary) hypertension: Secondary | ICD-10-CM | POA: Diagnosis not present

## 2022-02-02 DIAGNOSIS — J3089 Other allergic rhinitis: Secondary | ICD-10-CM | POA: Diagnosis not present

## 2022-02-02 DIAGNOSIS — F319 Bipolar disorder, unspecified: Secondary | ICD-10-CM | POA: Diagnosis present

## 2022-02-02 DIAGNOSIS — D638 Anemia in other chronic diseases classified elsewhere: Secondary | ICD-10-CM | POA: Diagnosis not present

## 2022-02-02 DIAGNOSIS — F410 Panic disorder [episodic paroxysmal anxiety] without agoraphobia: Secondary | ICD-10-CM | POA: Diagnosis present

## 2022-02-02 DIAGNOSIS — C3431 Malignant neoplasm of lower lobe, right bronchus or lung: Secondary | ICD-10-CM | POA: Diagnosis not present

## 2022-02-02 DIAGNOSIS — E7800 Pure hypercholesterolemia, unspecified: Secondary | ICD-10-CM | POA: Diagnosis present

## 2022-02-02 DIAGNOSIS — J441 Chronic obstructive pulmonary disease with (acute) exacerbation: Secondary | ICD-10-CM | POA: Diagnosis not present

## 2022-02-02 DIAGNOSIS — Z86718 Personal history of other venous thrombosis and embolism: Secondary | ICD-10-CM

## 2022-02-02 DIAGNOSIS — G8929 Other chronic pain: Secondary | ICD-10-CM | POA: Diagnosis present

## 2022-02-02 DIAGNOSIS — N179 Acute kidney failure, unspecified: Secondary | ICD-10-CM | POA: Diagnosis not present

## 2022-02-02 DIAGNOSIS — I110 Hypertensive heart disease with heart failure: Secondary | ICD-10-CM | POA: Diagnosis present

## 2022-02-02 DIAGNOSIS — R54 Age-related physical debility: Secondary | ICD-10-CM | POA: Diagnosis present

## 2022-02-02 DIAGNOSIS — I5032 Chronic diastolic (congestive) heart failure: Secondary | ICD-10-CM | POA: Diagnosis present

## 2022-02-02 DIAGNOSIS — Z8709 Personal history of other diseases of the respiratory system: Secondary | ICD-10-CM | POA: Diagnosis not present

## 2022-02-02 DIAGNOSIS — Z66 Do not resuscitate: Secondary | ICD-10-CM | POA: Diagnosis not present

## 2022-02-02 DIAGNOSIS — K219 Gastro-esophageal reflux disease without esophagitis: Secondary | ICD-10-CM | POA: Diagnosis present

## 2022-02-02 DIAGNOSIS — R918 Other nonspecific abnormal finding of lung field: Secondary | ICD-10-CM | POA: Diagnosis not present

## 2022-02-02 DIAGNOSIS — E785 Hyperlipidemia, unspecified: Secondary | ICD-10-CM | POA: Diagnosis present

## 2022-02-02 DIAGNOSIS — C3490 Malignant neoplasm of unspecified part of unspecified bronchus or lung: Secondary | ICD-10-CM | POA: Diagnosis not present

## 2022-02-02 DIAGNOSIS — M4856XA Collapsed vertebra, not elsewhere classified, lumbar region, initial encounter for fracture: Secondary | ICD-10-CM | POA: Diagnosis present

## 2022-02-02 DIAGNOSIS — R748 Abnormal levels of other serum enzymes: Secondary | ICD-10-CM | POA: Diagnosis present

## 2022-02-02 DIAGNOSIS — F17210 Nicotine dependence, cigarettes, uncomplicated: Secondary | ICD-10-CM | POA: Diagnosis present

## 2022-02-02 DIAGNOSIS — F411 Generalized anxiety disorder: Secondary | ICD-10-CM | POA: Diagnosis present

## 2022-02-02 DIAGNOSIS — Z8614 Personal history of Methicillin resistant Staphylococcus aureus infection: Secondary | ICD-10-CM

## 2022-02-02 DIAGNOSIS — J440 Chronic obstructive pulmonary disease with acute lower respiratory infection: Secondary | ICD-10-CM | POA: Diagnosis present

## 2022-02-02 DIAGNOSIS — E876 Hypokalemia: Secondary | ICD-10-CM | POA: Diagnosis present

## 2022-02-02 DIAGNOSIS — F259 Schizoaffective disorder, unspecified: Secondary | ICD-10-CM | POA: Diagnosis present

## 2022-02-02 DIAGNOSIS — Z888 Allergy status to other drugs, medicaments and biological substances status: Secondary | ICD-10-CM

## 2022-02-02 DIAGNOSIS — R531 Weakness: Secondary | ICD-10-CM | POA: Diagnosis not present

## 2022-02-02 DIAGNOSIS — D508 Other iron deficiency anemias: Secondary | ICD-10-CM | POA: Diagnosis not present

## 2022-02-02 DIAGNOSIS — E119 Type 2 diabetes mellitus without complications: Secondary | ICD-10-CM

## 2022-02-02 DIAGNOSIS — Z20822 Contact with and (suspected) exposure to covid-19: Secondary | ICD-10-CM | POA: Diagnosis present

## 2022-02-02 DIAGNOSIS — E782 Mixed hyperlipidemia: Secondary | ICD-10-CM

## 2022-02-02 DIAGNOSIS — Z79899 Other long term (current) drug therapy: Secondary | ICD-10-CM | POA: Diagnosis not present

## 2022-02-02 DIAGNOSIS — Z955 Presence of coronary angioplasty implant and graft: Secondary | ICD-10-CM

## 2022-02-02 DIAGNOSIS — Z885 Allergy status to narcotic agent status: Secondary | ICD-10-CM

## 2022-02-02 DIAGNOSIS — R404 Transient alteration of awareness: Secondary | ICD-10-CM | POA: Diagnosis not present

## 2022-02-02 DIAGNOSIS — R410 Disorientation, unspecified: Secondary | ICD-10-CM | POA: Diagnosis not present

## 2022-02-02 DIAGNOSIS — R000 Tachycardia, unspecified: Secondary | ICD-10-CM | POA: Diagnosis not present

## 2022-02-02 DIAGNOSIS — J9621 Acute and chronic respiratory failure with hypoxia: Secondary | ICD-10-CM | POA: Diagnosis not present

## 2022-02-02 DIAGNOSIS — J449 Chronic obstructive pulmonary disease, unspecified: Secondary | ICD-10-CM | POA: Diagnosis not present

## 2022-02-02 DIAGNOSIS — Z79890 Hormone replacement therapy (postmenopausal): Secondary | ICD-10-CM

## 2022-02-02 DIAGNOSIS — Z7401 Bed confinement status: Secondary | ICD-10-CM | POA: Diagnosis not present

## 2022-02-02 DIAGNOSIS — M545 Low back pain, unspecified: Secondary | ICD-10-CM | POA: Diagnosis present

## 2022-02-02 DIAGNOSIS — R0902 Hypoxemia: Secondary | ICD-10-CM | POA: Diagnosis not present

## 2022-02-02 DIAGNOSIS — E118 Type 2 diabetes mellitus with unspecified complications: Secondary | ICD-10-CM | POA: Diagnosis present

## 2022-02-02 DIAGNOSIS — D72828 Other elevated white blood cell count: Secondary | ICD-10-CM | POA: Diagnosis not present

## 2022-02-02 DIAGNOSIS — Z8672 Personal history of thrombophlebitis: Secondary | ICD-10-CM

## 2022-02-02 DIAGNOSIS — Z7189 Other specified counseling: Secondary | ICD-10-CM | POA: Diagnosis not present

## 2022-02-02 DIAGNOSIS — W19XXXA Unspecified fall, initial encounter: Secondary | ICD-10-CM | POA: Diagnosis present

## 2022-02-02 DIAGNOSIS — R4182 Altered mental status, unspecified: Secondary | ICD-10-CM | POA: Diagnosis not present

## 2022-02-02 DIAGNOSIS — Z7982 Long term (current) use of aspirin: Secondary | ICD-10-CM

## 2022-02-02 DIAGNOSIS — J90 Pleural effusion, not elsewhere classified: Secondary | ICD-10-CM | POA: Diagnosis not present

## 2022-02-02 DIAGNOSIS — G4700 Insomnia, unspecified: Secondary | ICD-10-CM | POA: Diagnosis present

## 2022-02-02 DIAGNOSIS — Z7984 Long term (current) use of oral hypoglycemic drugs: Secondary | ICD-10-CM

## 2022-02-02 DIAGNOSIS — C3491 Malignant neoplasm of unspecified part of right bronchus or lung: Secondary | ICD-10-CM | POA: Diagnosis not present

## 2022-02-02 DIAGNOSIS — E860 Dehydration: Secondary | ICD-10-CM | POA: Diagnosis not present

## 2022-02-02 DIAGNOSIS — Z7951 Long term (current) use of inhaled steroids: Secondary | ICD-10-CM

## 2022-02-02 DIAGNOSIS — M47816 Spondylosis without myelopathy or radiculopathy, lumbar region: Secondary | ICD-10-CM | POA: Diagnosis present

## 2022-02-02 DIAGNOSIS — Z880 Allergy status to penicillin: Secondary | ICD-10-CM

## 2022-02-02 DIAGNOSIS — E8809 Other disorders of plasma-protein metabolism, not elsewhere classified: Secondary | ICD-10-CM | POA: Diagnosis present

## 2022-02-02 DIAGNOSIS — Z515 Encounter for palliative care: Secondary | ICD-10-CM | POA: Diagnosis not present

## 2022-02-02 DIAGNOSIS — Z8249 Family history of ischemic heart disease and other diseases of the circulatory system: Secondary | ICD-10-CM

## 2022-02-02 DIAGNOSIS — Y92129 Unspecified place in nursing home as the place of occurrence of the external cause: Secondary | ICD-10-CM | POA: Diagnosis not present

## 2022-02-02 DIAGNOSIS — I959 Hypotension, unspecified: Secondary | ICD-10-CM | POA: Diagnosis not present

## 2022-02-02 DIAGNOSIS — Z881 Allergy status to other antibiotic agents status: Secondary | ICD-10-CM

## 2022-02-02 DIAGNOSIS — Z7985 Long-term (current) use of injectable non-insulin antidiabetic drugs: Secondary | ICD-10-CM

## 2022-02-02 DIAGNOSIS — Z9049 Acquired absence of other specified parts of digestive tract: Secondary | ICD-10-CM

## 2022-02-02 DIAGNOSIS — R451 Restlessness and agitation: Secondary | ICD-10-CM | POA: Diagnosis not present

## 2022-02-02 DIAGNOSIS — I252 Old myocardial infarction: Secondary | ICD-10-CM

## 2022-02-02 DIAGNOSIS — J168 Pneumonia due to other specified infectious organisms: Secondary | ICD-10-CM | POA: Diagnosis not present

## 2022-02-02 DIAGNOSIS — R3589 Other polyuria: Secondary | ICD-10-CM | POA: Diagnosis not present

## 2022-02-02 DIAGNOSIS — J811 Chronic pulmonary edema: Secondary | ICD-10-CM | POA: Diagnosis not present

## 2022-02-02 DIAGNOSIS — R6889 Other general symptoms and signs: Secondary | ICD-10-CM | POA: Diagnosis present

## 2022-02-02 DIAGNOSIS — E871 Hypo-osmolality and hyponatremia: Secondary | ICD-10-CM | POA: Diagnosis not present

## 2022-02-02 LAB — COMPREHENSIVE METABOLIC PANEL
ALT: 26 U/L (ref 0–44)
AST: 82 U/L — ABNORMAL HIGH (ref 15–41)
Albumin: 2.3 g/dL — ABNORMAL LOW (ref 3.5–5.0)
Alkaline Phosphatase: 78 U/L (ref 38–126)
Anion gap: 16 — ABNORMAL HIGH (ref 5–15)
BUN: 31 mg/dL — ABNORMAL HIGH (ref 8–23)
CO2: 23 mmol/L (ref 22–32)
Calcium: 5.1 mg/dL — CL (ref 8.9–10.3)
Chloride: 96 mmol/L — ABNORMAL LOW (ref 98–111)
Creatinine, Ser: 1.43 mg/dL — ABNORMAL HIGH (ref 0.61–1.24)
GFR, Estimated: 54 mL/min — ABNORMAL LOW (ref >60)
Glucose, Bld: 94 mg/dL (ref 70–99)
Potassium: 3.4 mmol/L — ABNORMAL LOW (ref 3.5–5.1)
Sodium: 135 mmol/L (ref 135–145)
Total Bilirubin: 0.7 mg/dL (ref 0.3–1.2)
Total Protein: 6.3 g/dL — ABNORMAL LOW (ref 6.5–8.1)

## 2022-02-02 LAB — CBC WITH DIFFERENTIAL/PLATELET
Abs Immature Granulocytes: 0.01 10*3/uL (ref 0.00–0.07)
Basophils Absolute: 0 10*3/uL (ref 0.0–0.1)
Basophils Relative: 0 %
Eosinophils Absolute: 0 10*3/uL (ref 0.0–0.5)
Eosinophils Relative: 0 %
HCT: 22.8 % — ABNORMAL LOW (ref 39.0–52.0)
Hemoglobin: 7.6 g/dL — ABNORMAL LOW (ref 13.0–17.0)
Immature Granulocytes: 0 %
Lymphocytes Relative: 12 %
Lymphs Abs: 0.8 10*3/uL (ref 0.7–4.0)
MCH: 27.6 pg (ref 26.0–34.0)
MCHC: 33.3 g/dL (ref 30.0–36.0)
MCV: 82.9 fL (ref 80.0–100.0)
Monocytes Absolute: 0.6 10*3/uL (ref 0.1–1.0)
Monocytes Relative: 10 %
Neutro Abs: 4.8 10*3/uL (ref 1.7–7.7)
Neutrophils Relative %: 78 %
Platelets: 145 10*3/uL — ABNORMAL LOW (ref 150–400)
RBC: 2.75 MIL/uL — ABNORMAL LOW (ref 4.22–5.81)
RDW: 20.3 % — ABNORMAL HIGH (ref 11.5–15.5)
WBC: 6.1 10*3/uL (ref 4.0–10.5)
nRBC: 0 % (ref 0.0–0.2)

## 2022-02-02 LAB — APTT: aPTT: 44 seconds — ABNORMAL HIGH (ref 24–36)

## 2022-02-02 LAB — RESP PANEL BY RT-PCR (RSV, FLU A&B, COVID)  RVPGX2
Influenza A by PCR: NEGATIVE
Influenza B by PCR: NEGATIVE
Resp Syncytial Virus by PCR: NEGATIVE
SARS Coronavirus 2 by RT PCR: NEGATIVE

## 2022-02-02 LAB — LACTIC ACID, PLASMA: Lactic Acid, Venous: 1.2 mmol/L (ref 0.5–1.9)

## 2022-02-02 LAB — PROTIME-INR
INR: 1.6 — ABNORMAL HIGH (ref 0.8–1.2)
Prothrombin Time: 19.3 seconds — ABNORMAL HIGH (ref 11.4–15.2)

## 2022-02-02 MED ORDER — LEVOFLOXACIN IN D5W 750 MG/150ML IV SOLN
750.0000 mg | Freq: Once | INTRAVENOUS | Status: AC
  Administered 2022-02-02: 750 mg via INTRAVENOUS
  Filled 2022-02-02: qty 150

## 2022-02-02 MED ORDER — LACTATED RINGERS IV BOLUS (SEPSIS)
1000.0000 mL | Freq: Once | INTRAVENOUS | Status: AC
  Administered 2022-02-02: 1000 mL via INTRAVENOUS

## 2022-02-02 MED ORDER — LACTATED RINGERS IV SOLN: INTRAVENOUS | Status: DC

## 2022-02-02 MED ORDER — LEVOFLOXACIN IN D5W 750 MG/150ML IV SOLN
750.0000 mg | INTRAVENOUS | Status: DC
  Administered 2022-02-03 – 2022-02-04 (×2): 750 mg via INTRAVENOUS
  Filled 2022-02-02 (×2): qty 150

## 2022-02-02 MED ORDER — LACTATED RINGERS IV BOLUS (SEPSIS): 500.0000 mL | Freq: Once | INTRAVENOUS | Status: DC

## 2022-02-02 MED ORDER — OXYCODONE HCL 5 MG PO TABS
10.0000 mg | ORAL_TABLET | Freq: Once | ORAL | Status: AC
  Administered 2022-02-02: 10 mg via ORAL
  Filled 2022-02-02: qty 2

## 2022-02-02 MED ORDER — CALCIUM GLUCONATE 10 % IV SOLN
1.0000 g | Freq: Once | INTRAVENOUS | Status: AC
  Administered 2022-02-02: 1 g via INTRAVENOUS
  Filled 2022-02-02: qty 10

## 2022-02-02 NOTE — ED Notes (Signed)
Pt placed on 2L [place] at this time. 

## 2022-02-02 NOTE — Sepsis Progress Note (Signed)
Following per sepsis protocol   

## 2022-02-02 NOTE — Progress Notes (Signed)
Pharmacy Antibiotic Note  [name] is a 66 y.o. male BIB EMS from nursing home and admitted on 02/02/2022 with sepsis secondary to pneumonia in setting of non-productive cough w/ weakness x1 week. 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 Pharmacy has been consulted for Levofloxacin dosing.  WBC 6.1, Tmax 99.5, ANC 4.8, LA 1.2 SCr 1.43, elevated from baseline SCr~0.6-0.8  Levofloxacin 750mg  IV x1 given in ED at 21:29 on 02/02/22.   Plan: Continue levofloxacin 750mg  IV q24h x 4 days  Monitor daily CBC, temp, SCr, and for clinical signs of improvement  F/u cultures and de-escalate antibiotic as able   Height: 6' (182.9 cm) Weight: 77.1 kg (170 lb) IBW/kg (Calculated) : 77.6  Temp (24hrs), Avg:98.9 F (37.2 C), Min:98.9 F (37.2 C), Max:98.9 F (37.2 C)  Recent Labs  Lab 02/02/22 2042  WBC 6.1  CREATININE 1.43*  LATICACIDVEN 1.2    Estimated Creatinine Clearance: 54.7 mL/min (A) (by C-G formula based on SCr of 1.43 mg/dL (H)).    Allergies  Allergen Reactions   Rocephin [Ceftriaxone] Anaphylaxis   Zofran [Ondansetron Hcl] Swelling    sts had tongue swelling and trouble breathing   Ampicillin     Per MAR    Lipitor [Atorvastatin]     Per MAR    Toradol [Ketorolac Tromethamine] Nausea And Vomiting    Pt states he is not allergic to ibuprofen   Tramadol Nausea And Vomiting    Not listed on MAR     Antimicrobials this admission: Levofloxacin 12/13 >> (12/17)  Dose adjustments this admission: N/A  Microbiology results: 12/13 BCx: pending 12/13 UCx: pending  Thank you for allowing pharmacy to be a part of this patient's care.  Luisa Hart, PharmD, BCPS Clinical Pharmacist 02/02/2022 10:52 PM   Please refer to [facility] for pharmacy phone number

## 2022-02-02 NOTE — ED Notes (Signed)
Communicated critical calcium level of 5.1 to PA at this time

## 2022-02-02 NOTE — ED Provider Notes (Signed)
[facility] EMERGENCY DEPARTMENT Provider Note   CSN: [id number] Arrival date & time: 02/02/22  2033     History  Chief Complaint  Patient presents with   Weakness    [name] is a 66 y.o. male.  The history is provided by the patient and medical records. No language interpreter was used.  Weakness    66 year old male with multiple comorbidities which include non-small cell lung cancer, bipolar, COPD, CAD, CHF, asthma, hypertension brought here via EMS from a nursing home for evaluation of generalized weakness.  For the past 2 to 3 days patient endorsed increased cough, generalized fatigue, feeling very weak, and has fallen twice.  States each time when he gets up he endorsed weakness in his legs any assistance up to the floor.  He normally use a wheelchair.  He denies hitting his head or loss of consciousness.  He also endorsed increasing nonproductive cough for the past week.  He does note some chills without fever.  He denies nausea vomiting or diarrhea or dysuria.  He is not on any blood thinner medication.  Does have history of lung cancer.  Home Medications Prior to Admission medications   Medication Sig Start Date End Date Taking? Authorizing Provider  acetaminophen (TYLENOL) 325 MG tablet Take 650 mg by mouth every 6 (six) hours as needed (pain.).    [provider]  albuterol (PROVENTIL) (2.5 MG/3ML) 0.083% nebulizer solution Take 2.5 mg by nebulization every 6 (six) hours as needed for wheezing or shortness of breath.    [provider]  albuterol (VENTOLIN HFA) 108 (90 Base) MCG/ACT inhaler Inhale 2 puffs into the lungs every 6 (six) hours as needed for wheezing or shortness of breath.    [provider]  allopurinol (ZYLOPRIM) 100 MG tablet Take 100 mg by mouth daily. (0900) 11/27/20   [provider]  aspirin EC 81 MG tablet Take 1 tablet (81 mg total) by mouth daily. Swallow whole. 11/03/21   O'Neal, Cassie Freer,  MD  calcium-vitamin D (OSCAL WITH D) 500-5 MG-MCG tablet Take 1 tablet by mouth 2 (two) times daily. 12/20/21   Curt Bears, MD  cyclobenzaprine (FLEXERIL) 10 MG tablet Take 10 mg by mouth 3 (three) times daily. (0900, 1300 & 2100)    [provider]  ferrous sulfate 325 (65 FE) MG tablet Take 325 mg by mouth in the morning and at bedtime. (0800 & 1600)    [provider]  Fluticasone-Umeclidin-Vilant (TRELEGY ELLIPTA) 200-62.5-25 MCG/ACT AEPB Inhale 1 puff into the lungs daily. 10/26/21   Cobb, Karie Schwalbe, NP  furosemide (LASIX) 20 MG tablet Take 20 mg by mouth in the morning. (0900) 11/27/20   [provider]  gabapentin (NEURONTIN) 400 MG capsule Take 400 mg by mouth 3 (three) times daily. (0900, 1300 & 2100)    [provider]  Glucagon, rDNA, (GLUCAGON EMERGENCY) 1 MG KIT Inject 1 mg as directed as needed (hypoglycemia).    [provider]  HUMALOG KWIKPEN 100 UNIT/ML KwikPen Inject 0-10 Units into the skin in the morning, at noon, in the evening, and at bedtime. Per Sliding Scale: 0-150=0 units 151-200=2 units 201-250=4 units 251-350=8 units 351-400=10 units Patient not taking: Reported on 12/20/2021 03/04/21   [provider]  ipratropium-albuterol (DUONEB) 0.5-2.5 (3) MG/3ML SOLN Inhale 3 mLs into the lungs every 4 (four) hours as needed (wheezing/COPD).    [provider]  levothyroxine (SYNTHROID) 75 MCG tablet Take 75 mcg by mouth daily before breakfast.  [provider]  LORazepam (ATIVAN) 2 MG tablet Take 2 mg by mouth every 12 (twelve) hours. (0800 & 2000)    [provider]  metFORMIN (GLUCOPHAGE) 500 MG tablet Take 500 mg by mouth 2 (two) times daily with a meal. (0900 & 1700)    [provider]  metoprolol succinate (TOPROL-XL) 25 MG 24 hr tablet Take 25 mg by mouth in the morning. (0900) 04/02/18   [provider]  omeprazole (PRILOSEC) 20 MG capsule Take 20 mg by mouth in the  morning.    [provider]  Oxycodone HCl 10 MG TABS Take 10 mg by mouth every 6 (six) hours as needed (pain).    [provider]  polyethylene glycol (MIRALAX / GLYCOLAX) packet Take 17 g by mouth daily as needed for mild constipation.     [provider]  pravastatin (PRAVACHOL) 80 MG tablet Take 80 mg by mouth at bedtime. (2000)    [provider]  prochlorperazine (COMPAZINE) 10 MG tablet Take 1 tablet (10 mg total) by mouth every 6 (six) hours as needed for nausea or vomiting. 11/29/21   Heilingoetter, Cassandra L, PA-C  QUEtiapine (SEROQUEL) 100 MG tablet Take 100 mg by mouth at bedtime. (2000)    [provider]  saccharomyces boulardii (FLORASTOR) 250 MG capsule Take 250 mg by mouth 2 (two) times daily. (0900 & 2100)    [provider]  temazepam (RESTORIL) 30 MG capsule Take 30 mg by mouth at bedtime. (2000)    [provider]  therapeutic multivitamin-minerals (THERAGRAN-M) tablet Take 1 tablet by mouth in the morning. (0900)    [provider]  venlafaxine (EFFEXOR) 75 MG tablet Take 75 mg by mouth in the morning. (0900)    [provider]      Allergies    Rocephin [ceftriaxone], Zofran [ondansetron hcl], Ampicillin, Lipitor [atorvastatin], Toradol [ketorolac tromethamine], and Tramadol    Review of Systems   Review of Systems  Neurological:  Positive for weakness.  All other systems reviewed and are negative.   Physical Exam Updated Vital Signs BP 92/75   Pulse (!) 117   Temp 98.9 F (37.2 C) (Oral)   Resp 18   Ht 6' (1.829 m)   Wt 77.1 kg   SpO2 93%   BMI 23.06 kg/m  Physical Exam Vitals and nursing note reviewed.  Constitutional:      General: He is not in acute distress.    Appearance: He is well-developed. He is ill-appearing.  HENT:     Head: Atraumatic.     Mouth/Throat:     Mouth: Mucous membranes are dry.  Eyes:     Conjunctiva/sclera: Conjunctivae normal.  Cardiovascular:      Rate and Rhythm: Tachycardia present.     Heart sounds: No murmur heard.    No friction rub. No gallop.  Pulmonary:     Breath sounds: Wheezing and rales present.  Abdominal:     Palpations: Abdomen is soft.  Musculoskeletal:     Cervical back: Neck supple.     Right lower leg: No edema.     Left lower leg: No edema.  Skin:    Findings: No rash.  Neurological:     Mental Status: He is alert. Mental status is at baseline.     ED Results / Procedures / Treatments   Labs (all labs ordered are listed, but only abnormal results are displayed) Labs Reviewed  COMPREHENSIVE METABOLIC PANEL - Abnormal; Notable for the following  components:      Result Value   Potassium 3.4 (*)    Chloride 96 (*)    BUN 31 (*)    Creatinine, Ser 1.43 (*)    Calcium 5.1 (*)    Total Protein 6.3 (*)    Albumin 2.3 (*)    AST 82 (*)    GFR, Estimated 54 (*)    Anion gap 16 (*)    All other components within normal limits  CBC WITH DIFFERENTIAL/PLATELET - Abnormal; Notable for the following components:   RBC 2.75 (*)    Hemoglobin 7.6 (*)    HCT 22.8 (*)    RDW 20.3 (*)    Platelets 145 (*)    All other components within normal limits  PROTIME-INR - Abnormal; Notable for the following components:   Prothrombin Time 19.3 (*)    INR 1.6 (*)    All other components within normal limits  APTT - Abnormal; Notable for the following components:   aPTT 44 (*)    All other components within normal limits  RESP PANEL BY RT-PCR (RSV, FLU A&B, COVID)  RVPGX2  CULTURE, BLOOD (ROUTINE X 2)  CULTURE, BLOOD (ROUTINE X 2)  URINE CULTURE  LACTIC ACID, PLASMA  URINALYSIS, ROUTINE W REFLEX MICROSCOPIC  CBC  BASIC METABOLIC PANEL    EKG None ED ECG REPORT   Date: 02/02/2022  Rate: 117  Rhythm: sinus tachycardia  QRS Axis: normal  Intervals: normal  ST/T Wave abnormalities: nonspecific ST changes  Conduction Disutrbances:none  Narrative Interpretation:   Old EKG Reviewed: unchanged  I have  personally reviewed the EKG tracing and agree with the computerized printout as noted.   Radiology DG Chest Port 1 View  Result Date: 02/02/2022 CLINICAL DATA:  Questionable sepsis EXAM: PORTABLE CHEST 1 VIEW COMPARISON:  CT chest 10/26/2021.  Chest x-ray 11/24/2020. FINDINGS: Small right pleural effusion with right lower lung airspace opacities extending to the right hilum appears stable. Cardiomediastinal silhouette is within normal limits. No evidence for pneumothorax. No acute fractures are seen. Stable nodular density in the left lower lung. IMPRESSION: 1. Stable small right pleural effusion with right lower lung airspace opacities extending to the right hilum. 2. Stable nodular density in the left lower lung. Electronically Signed   By: Ronney Asters M.D.   On: 02/02/2022 21:30    Procedures .Critical Care  Performed by: Domenic Moras, PA-C Authorized by: Domenic Moras, PA-C   Critical care provider statement:    Critical care time (minutes):  56   Critical care was time spent personally by me on the following activities:  Development of treatment plan with patient or surrogate, discussions with consultants, evaluation of patient's response to treatment, examination of patient, ordering and review of laboratory studies, ordering and review of radiographic studies, ordering and performing treatments and interventions, pulse oximetry, re-evaluation of patient's condition and review of old charts     Medications Ordered in ED Medications  levofloxacin (LEVAQUIN) IVPB 750 mg (has no administration in time range)  calcium gluconate inj 10% (1 g) URGENT USE ONLY! (has no administration in time range)  lactated ringers bolus 1,000 mL (0 mLs Intravenous Stopped 02/02/22 2221)    And  lactated ringers bolus 1,000 mL (0 mLs Intravenous Stopped 02/02/22 2221)  levofloxacin (LEVAQUIN) IVPB 750 mg (0 mg Intravenous Stopped 02/02/22 2307)  oxyCODONE (Oxy IR/ROXICODONE) immediate release tablet 10 mg  (10 mg Oral Given 02/02/22 2236)    ED Course/ Medical Decision Making/ A&P  Medical Decision Making Amount and/or Complexity of Data Reviewed Labs: ordered. Radiology: ordered. ECG/medicine tests: ordered.  Risk Prescription drug management.   BP 106/75   Pulse (!) 235   Temp 98.9 F (37.2 C) (Oral)   Resp (!) 30   Ht 6' (1.829 m)   Wt 77.1 kg   SpO2 99%   BMI 23.06 kg/m   57:67 PM  66 year old male with multiple comorbidities which include bipolar, COPD, CAD, CHF, asthma, hypertension brought here via EMS from a nursing home for evaluation of generalized weakness.  For the past 2 to 3 days patient endorsed increased cough, generalized fatigue, feeling very weak, and has fallen twice.  States each time when he gets up he endorsed weakness in his legs any assistance up to the floor.  He normally use a wheelchair.  He denies hitting his head or loss of consciousness.  He also endorsed increasing nonproductive cough for the past week.  He does note some chills without fever.  He denies nausea vomiting or diarrhea or dysuria.  He is not on any blood thinner medication.  Does have history of lung cancer.  On exam the is an ill-appearing male actively coughing and appears to be in some distress.  Heart remarkable tachycardia, lungs with decreased lung sounds with wheezes and crackles heard throughout all lung fields.  He is globally weak but with equal strength.  He is pale in appearance  Vitals unremarkable for blood pressure of 90/75, heart rate of 117, O2 sats at 93% on room air, or temperature is 98.9 however patient is warm to the touch.  Code sepsis initiated and since patient is allergic to Rocephin, will treat with Levaquin pharmacy to dose.  IV fluid given at 30 mL/kg.  Chest x-ray and viral panel ordered.  Workup initiated.  Care discussed with Dr. Armandina Gemma.  -Labs ordered, independently viewed and interpreted by me.  Labs remarkable for clcium of 5.1  consistent with hypocalcemia.  It was 6.3 approximately 1 month ago.  Will give calcium gluconate. Cr. 1.43, IVF given. Hgb 7.6 similar to baseline -The patient was maintained on a cardiac monitor.  I personally viewed and interpreted the cardiac monitored which showed an underlying rhythm of: sinus tach -Imaging independently viewed and interpreted by me and I agree with radiologist's interpretation.  Result remarkable for CXR showing stable small right pleural effusion with right lower ling airspace opacities -This patient presents to the ED for concern of weakness, this involves an extensive number of treatment options, and is a complaint that carries with it a high risk of complications and morbidity.  The differential diagnosis includes sepsis, electrolytes derangement, cardiac arrhythmia,  -Co morbidities that complicate the patient evaluation includes lung CA, COPD, CAD, CHF, DM -Treatment includes levaquin, IVF, calcium -Reevaluation of the patient after these medicines showed that the patient improved -PCP office notes or outside notes reviewed -Discussion with specialist [facility] DR. HOwerter who agrees to see and will admit pt -Escalation to admission/observation considered: patients feels comfortable with admission.  Pt is a DNR/DNI currently under comfort care         Final Clinical Impression(s) / ED Diagnoses Final diagnoses:  Generalized weakness  Hypocalcemia  Pneumonia of right lower lobe due to infectious organism    Rx / DC Orders ED Discharge Orders     None         Domenic Moras, PA-C 02/02/22 2347    Regan Lemming, MD 02/03/22 [phone]

## 2022-02-02 NOTE — ED Triage Notes (Signed)
Pt BIB EMS from nursing home. Per EMS, pt has had generalized weakness for the past two days with two falls today. Pt felt weak in legs and assisted himself to floor. Denies hitting head or LOC. Pt has also had non productive cough for one week. Hx of lung cancer. Currently A/Ox3

## 2022-02-03 ENCOUNTER — Encounter (HOSPITAL_COMMUNITY): Payer: Self-pay | Admitting: Internal Medicine

## 2022-02-03 DIAGNOSIS — N179 Acute kidney failure, unspecified: Secondary | ICD-10-CM | POA: Diagnosis not present

## 2022-02-03 DIAGNOSIS — J189 Pneumonia, unspecified organism: Secondary | ICD-10-CM | POA: Diagnosis present

## 2022-02-03 DIAGNOSIS — E039 Hypothyroidism, unspecified: Secondary | ICD-10-CM | POA: Diagnosis not present

## 2022-02-03 DIAGNOSIS — F411 Generalized anxiety disorder: Secondary | ICD-10-CM | POA: Diagnosis present

## 2022-02-03 DIAGNOSIS — Z8709 Personal history of other diseases of the respiratory system: Secondary | ICD-10-CM

## 2022-02-03 DIAGNOSIS — D638 Anemia in other chronic diseases classified elsewhere: Secondary | ICD-10-CM | POA: Diagnosis not present

## 2022-02-03 DIAGNOSIS — I5032 Chronic diastolic (congestive) heart failure: Secondary | ICD-10-CM | POA: Diagnosis present

## 2022-02-03 DIAGNOSIS — E860 Dehydration: Secondary | ICD-10-CM | POA: Diagnosis present

## 2022-02-03 DIAGNOSIS — E876 Hypokalemia: Secondary | ICD-10-CM | POA: Diagnosis present

## 2022-02-03 DIAGNOSIS — R531 Weakness: Secondary | ICD-10-CM | POA: Diagnosis not present

## 2022-02-03 LAB — MAGNESIUM: Magnesium: 0.6 mg/dL — CL (ref 1.7–2.4)

## 2022-02-03 LAB — CBC
HCT: 23.2 % — ABNORMAL LOW (ref 39.0–52.0)
Hemoglobin: 7.6 g/dL — ABNORMAL LOW (ref 13.0–17.0)
MCH: 27.6 pg (ref 26.0–34.0)
MCHC: 32.8 g/dL (ref 30.0–36.0)
MCV: 84.4 fL (ref 80.0–100.0)
Platelets: 163 10*3/uL (ref 150–400)
RBC: 2.75 MIL/uL — ABNORMAL LOW (ref 4.22–5.81)
RDW: 20.3 % — ABNORMAL HIGH (ref 11.5–15.5)
WBC: 7.2 10*3/uL (ref 4.0–10.5)
nRBC: 0 % (ref 0.0–0.2)

## 2022-02-03 LAB — URINALYSIS, ROUTINE W REFLEX MICROSCOPIC
Bilirubin Urine: NEGATIVE
Glucose, UA: NEGATIVE mg/dL
Ketones, ur: 5 mg/dL — AB
Leukocytes,Ua: NEGATIVE
Nitrite: NEGATIVE
Protein, ur: 30 mg/dL — AB
Specific Gravity, Urine: 1.014 (ref 1.005–1.030)
pH: 5 (ref 5.0–8.0)

## 2022-02-03 LAB — IRON AND TIBC
Iron: 22 ug/dL — ABNORMAL LOW (ref 45–182)
Saturation Ratios: 18 % (ref 17.9–39.5)
TIBC: 126 ug/dL — ABNORMAL LOW (ref 250–450)
UIBC: 104 ug/dL

## 2022-02-03 LAB — COMPREHENSIVE METABOLIC PANEL
ALT: 24 U/L (ref 0–44)
AST: 72 U/L — ABNORMAL HIGH (ref 15–41)
Albumin: 2.2 g/dL — ABNORMAL LOW (ref 3.5–5.0)
Alkaline Phosphatase: 74 U/L (ref 38–126)
Anion gap: 15 (ref 5–15)
BUN: 22 mg/dL (ref 8–23)
CO2: 21 mmol/L — ABNORMAL LOW (ref 22–32)
Calcium: 5.7 mg/dL — CL (ref 8.9–10.3)
Chloride: 100 mmol/L (ref 98–111)
Creatinine, Ser: 1.12 mg/dL (ref 0.61–1.24)
GFR, Estimated: >60 mL/min (ref >60)
Glucose, Bld: 84 mg/dL (ref 70–99)
Potassium: 3.5 mmol/L (ref 3.5–5.1)
Sodium: 136 mmol/L (ref 135–145)
Total Bilirubin: 0.9 mg/dL (ref 0.3–1.2)
Total Protein: 6.1 g/dL — ABNORMAL LOW (ref 6.5–8.1)

## 2022-02-03 LAB — HEMOGLOBIN A1C
Hgb A1c MFr Bld: 5.4 % (ref 4.8–5.6)
Mean Plasma Glucose: 108 mg/dL

## 2022-02-03 LAB — BRAIN NATRIURETIC PEPTIDE: B Natriuretic Peptide: 161.4 pg/mL — ABNORMAL HIGH (ref 0.0–100.0)

## 2022-02-03 LAB — CBG MONITORING, ED
Glucose-Capillary: 83 mg/dL (ref 70–99)
Glucose-Capillary: 126 mg/dL — ABNORMAL HIGH (ref 70–99)
Glucose-Capillary: 106 mg/dL — ABNORMAL HIGH (ref 70–99)
Glucose-Capillary: 180 mg/dL — ABNORMAL HIGH (ref 70–99)

## 2022-02-03 LAB — PHOSPHORUS: Phosphorus: 2.9 mg/dL (ref 2.5–4.6)

## 2022-02-03 LAB — PROTIME-INR
INR: 1.6 — ABNORMAL HIGH (ref 0.8–1.2)
Prothrombin Time: 19.2 seconds — ABNORMAL HIGH (ref 11.4–15.2)

## 2022-02-03 LAB — CK: Total CK: 3658 U/L — ABNORMAL HIGH (ref 49–397)

## 2022-02-03 LAB — FERRITIN: Ferritin: 1122 ng/mL — ABNORMAL HIGH (ref 24–336)

## 2022-02-03 LAB — PROCALCITONIN: Procalcitonin: 0.76 ng/mL

## 2022-02-03 LAB — TSH: TSH: 4.812 u[IU]/mL — ABNORMAL HIGH (ref 0.350–4.500)

## 2022-02-03 MED ORDER — SODIUM CHLORIDE 0.9 % IV SOLN: INTRAVENOUS | Status: DC

## 2022-02-03 MED ORDER — OXYCODONE HCL 5 MG PO TABS: 10.0000 mg | ORAL_TABLET | Freq: Four times a day (QID) | ORAL | Status: DC | PRN

## 2022-02-03 MED ORDER — UMECLIDINIUM BROMIDE 62.5 MCG/ACT IN AEPB
1.0000 | INHALATION_SPRAY | Freq: Every day | RESPIRATORY_TRACT | Status: DC
  Administered 2022-02-03 – 2022-02-05 (×3): 1 via RESPIRATORY_TRACT
  Filled 2022-02-03: qty 7

## 2022-02-03 MED ORDER — CYCLOBENZAPRINE HCL 10 MG PO TABS
10.0000 mg | ORAL_TABLET | Freq: Three times a day (TID) | ORAL | Status: DC | PRN
  Administered 2022-02-04: 10 mg via ORAL
  Filled 2022-02-03: qty 1

## 2022-02-03 MED ORDER — MELATONIN 3 MG PO TABS: 3.0000 mg | ORAL_TABLET | Freq: Every evening | ORAL | Status: DC | PRN

## 2022-02-03 MED ORDER — ACETAMINOPHEN 650 MG RE SUPP: 650.0000 mg | Freq: Four times a day (QID) | RECTAL | Status: DC | PRN

## 2022-02-03 MED ORDER — LORAZEPAM 0.5 MG PO TABS
0.5000 mg | ORAL_TABLET | Freq: Two times a day (BID) | ORAL | Status: DC | PRN
  Administered 2022-02-03 – 2022-02-04 (×2): 0.5 mg via ORAL
  Filled 2022-02-03 (×2): qty 1

## 2022-02-03 MED ORDER — ACETAMINOPHEN 325 MG PO TABS: 650.0000 mg | ORAL_TABLET | Freq: Four times a day (QID) | ORAL | Status: DC | PRN

## 2022-02-03 MED ORDER — VENLAFAXINE HCL 75 MG PO TABS
75.0000 mg | ORAL_TABLET | Freq: Every day | ORAL | Status: DC
  Administered 2022-02-04 – 2022-02-05 (×2): 75 mg via ORAL
  Filled 2022-02-03 (×6): qty 1

## 2022-02-03 MED ORDER — INSULIN ASPART 100 UNIT/ML IJ SOLN
0.0000 [IU] | Freq: Three times a day (TID) | INTRAMUSCULAR | Status: DC
  Administered 2022-02-04 (×2): 1 [IU] via SUBCUTANEOUS

## 2022-02-03 MED ORDER — PRAVASTATIN SODIUM 40 MG PO TABS: 80.0000 mg | ORAL_TABLET | Freq: Every day | ORAL | Status: DC

## 2022-02-03 MED ORDER — LEVOTHYROXINE SODIUM 75 MCG PO TABS
75.0000 ug | ORAL_TABLET | Freq: Every day | ORAL | Status: DC
  Administered 2022-02-03 – 2022-02-04 (×2): 75 ug via ORAL
  Filled 2022-02-03 (×2): qty 1

## 2022-02-03 MED ORDER — LEVALBUTEROL HCL 0.63 MG/3ML IN NEBU
0.6300 mg | INHALATION_SOLUTION | Freq: Four times a day (QID) | RESPIRATORY_TRACT | Status: DC
  Administered 2022-02-03 – 2022-02-04 (×4): 0.63 mg via RESPIRATORY_TRACT
  Filled 2022-02-03 (×4): qty 3

## 2022-02-03 MED ORDER — LACTATED RINGERS IV SOLN: INTRAVENOUS | Status: AC

## 2022-02-03 MED ORDER — PROCHLORPERAZINE MALEATE 10 MG PO TABS: 10.0000 mg | ORAL_TABLET | Freq: Four times a day (QID) | ORAL | Status: DC | PRN

## 2022-02-03 MED ORDER — ALBUTEROL SULFATE (2.5 MG/3ML) 0.083% IN NEBU
2.5000 mg | INHALATION_SOLUTION | Freq: Four times a day (QID) | RESPIRATORY_TRACT | Status: DC | PRN
  Administered 2022-02-03: 2.5 mg via RESPIRATORY_TRACT
  Filled 2022-02-03: qty 3

## 2022-02-03 MED ORDER — METOPROLOL SUCCINATE ER 25 MG PO TB24
25.0000 mg | ORAL_TABLET | Freq: Every day | ORAL | Status: DC
  Administered 2022-02-03 – 2022-02-04 (×2): 25 mg via ORAL
  Filled 2022-02-03 (×2): qty 1

## 2022-02-03 MED ORDER — POLYETHYLENE GLYCOL 3350 17 G PO PACK: 17.0000 g | PACK | Freq: Every day | ORAL | Status: DC | PRN

## 2022-02-03 MED ORDER — ASPIRIN 81 MG PO TBEC
81.0000 mg | DELAYED_RELEASE_TABLET | Freq: Every day | ORAL | Status: DC
  Administered 2022-02-03 – 2022-02-04 (×2): 81 mg via ORAL
  Filled 2022-02-03 (×2): qty 1

## 2022-02-03 MED ORDER — QUETIAPINE FUMARATE 50 MG PO TABS
100.0000 mg | ORAL_TABLET | Freq: Every day | ORAL | Status: DC
  Administered 2022-02-03 – 2022-02-05 (×3): 100 mg via ORAL
  Filled 2022-02-03: qty 1
  Filled 2022-02-03 (×2): qty 2

## 2022-02-03 MED ORDER — FLUTICASONE FUROATE-VILANTEROL 200-25 MCG/ACT IN AEPB
1.0000 | INHALATION_SPRAY | Freq: Every day | RESPIRATORY_TRACT | Status: DC
  Administered 2022-02-03 – 2022-02-05 (×3): 1 via RESPIRATORY_TRACT
  Filled 2022-02-03: qty 28

## 2022-02-03 MED ORDER — OXYCODONE HCL 5 MG PO TABS
10.0000 mg | ORAL_TABLET | Freq: Four times a day (QID) | ORAL | Status: DC | PRN
  Administered 2022-02-03 – 2022-02-04 (×7): 10 mg via ORAL
  Filled 2022-02-03 (×7): qty 2

## 2022-02-03 MED ORDER — GUAIFENESIN ER 600 MG PO TB12
1200.0000 mg | ORAL_TABLET | Freq: Two times a day (BID) | ORAL | Status: DC
  Administered 2022-02-03 – 2022-02-05 (×5): 1200 mg via ORAL
  Filled 2022-02-03 (×5): qty 2

## 2022-02-03 MED ORDER — MAGNESIUM SULFATE 4 GM/100ML IV SOLN
4.0000 g | Freq: Once | INTRAVENOUS | Status: AC
  Administered 2022-02-03: 4 g via INTRAVENOUS
  Filled 2022-02-03: qty 100

## 2022-02-03 MED ORDER — CALCIUM GLUCONATE-NACL 1-0.675 GM/50ML-% IV SOLN
1.0000 g | Freq: Once | INTRAVENOUS | Status: AC
  Administered 2022-02-03: 1000 mg via INTRAVENOUS
  Filled 2022-02-03: qty 50

## 2022-02-03 MED ORDER — FERROUS SULFATE 325 (65 FE) MG PO TABS
325.0000 mg | ORAL_TABLET | Freq: Every day | ORAL | Status: DC
  Administered 2022-02-03 – 2022-02-04 (×2): 325 mg via ORAL
  Filled 2022-02-03 (×2): qty 1

## 2022-02-03 NOTE — H&P (Signed)
History and Physical      [name] ZOX:[id number] DOB: 1955-10-05 DOA: 02/02/2022  PCP: [name], MD  Patient coming from: SNF  I have personally briefly reviewed patient's old medical records in [facility]  Chief Complaint: Generalized weakness  HPI: [name] is a 66 y.o. male with medical history significant for non-small cell lung cancer chronic diastolic heart failure, COPD, type 2 diabetes mellitus, anemia of chronic disease associate with baseline hemoglobin 7-9, acquired hypothyroidism, who is admitted to [facility] on 02/02/2022 with generalized weakness after presenting from snf to [facility] ED complaining of generalized weakness.    The patient reports 3 to 4 days of progressive generalized weakness in the absence of any acute focal weakness or any acute focal numbness or paresthesias.  Over the last 3 to 4 days he notes new, progressive mildly productive cough in the absence of any hemoptysis.  No associated with any fever, chills, rigors, or generalized myalgias.  He notes that this generalized weakness over the last 3 to 4 days is rendered more difficult for him to ambulate, and notes that he has almost fallen on multiple occasions over that timeframe due to the generalized weakness.  Denies any associated dizziness, lightheadedness, presyncope, or syncope.  No recent chest pain or palpitations, orthopnea, PND, or worsening peripheral edema.  He has a history of non-small cell lung cancer for which he follows with Dr. Julien Nordmann as outpatient oncologist.  Medical history also notable for chronic diastolic heart failure, with most recent echocardiogram on 11/26/2021, which is notable for LVEF 55 to 60%, indeterminate diastolic parameters, normal right ventricular systolic function, trivial mitral regurgitation.  On daily Lasix as an outpatient.  Additionally, he has a history of anemia of chronic disease, associated baseline hemoglobin range 7-9, with most recent  prior hemoglobin found to be 7.3 on 12/20/2021.  Per chart review, most recent prior calcium level, adjusted for hypoalbuminemia, was noted to be 7.1 when checked on 12/20/2021.      ED Course:  Vital signs in the ED were notable for the following: Temperature max 99.5; heart rate 111 118; initial blood pressure 92/75, which subsequently increased to 123/89 following interval IV fluids; respiratory rate 16-22, oxygen saturation 94 to 99% on room air.  Labs were notable for the following: CMP notable for the following: Potassium 3.2, bicarbonate 23, BUN 31, creatinine 1.43 compared to most recent prior value of 0.61 on 12/20/2021, BUN/creatinine ratio 21.7, calcium, adjusted for mild hypoalbuminemia noted to be 6.5, albumin 2.3, AST 82, otherwise liver enzymes within normal limits.  Lactic acid 1.2.  CBC notable for white blood cell count 6100, hemoglobin 7.6 associated normocytic/normochromic properties.  INR 1.6.  Urinalysis showed no white blood cells, large hemoglobin with no RBCs, was positive for hyaline casts and demonstrated 30 protein.  Blood cultures x 2 were collected prior to initiation of IV antibiotics.  COVID-19, influenza, and RSV PCR were all negative.  Per my interpretation, EKG in ED demonstrated the following: EKG shows sinus tachycardia with heart rate 117, normal intervals, no evidence of T wave or ST changes, including no evidence of ST elevation.  Chest x-ray, in comparison to chest x-ray from October 2022, shows right lower lobe airspace opacity concerning for infiltrate, while showing stable small right pleural effusion, no evidence of pulmonary edema, and no evidence of pneumothorax.  While in the ED, the following were administered: Calcium gluconate 1 g IV x 1, oxycodone IR 10 mg p.o. x 1, lactated  Ringer's x 2 L bolus, Levaquin.  Subsequently, the patient was admitted for further evaluation management generalized weakness, low suspicion for community-acquired pneumonia,  with presentation also notable for acute kidney injury, dehydration, and with lab abnormalities that include hypocalcemia and hypokalemia.     Review of Systems: As per HPI otherwise 10 point review of systems negative.   Past Medical History:  Diagnosis Date   Anginal pain ([place])    Anxiety    Arthritis    "right hip; herniated L4-5" (11/05/2015)   Asthma    Bipolar 1 disorder (HCC)    CHF (congestive heart failure) (HCC)    Chronic bronchitis (HCC)    Chronic lower back pain    Chronic pain disorder    Community acquired pneumonia 10/30/2016   COPD (chronic obstructive pulmonary disease) ([place])    Coronary artery disease    Coronary atherosclerosis 12/05/2008   Qualifier: Diagnosis of  By: Jaramillo, [place], HX OF 12/05/2008   Qualifier: Diagnosis of  By: Jaramillo, [place], MAJOR 12/05/2008   Qualifier: Diagnosis of  By: Sidney Ace     Diabetes mellitus type 2 with complications ([place]) 1/60/1093   Diverticulitis large intestine    DVT (deep venous thrombosis) ([place]) 1991   "left calf; after 1st knee scope"   EROSIVE GASTRITIS 12/05/2008   Qualifier: Diagnosis of  By: Sidney Ace     Fungal esophagitis    GERD 12/05/2008   Qualifier: Diagnosis of  By: Sidney Ace     GERD (gastroesophageal reflux disease)    Heart attack ([place]) 2010   BMS x 2 RCA   High cholesterol    Hyperlipidemia    Hypertension    Hypothyroidism    MRSA (methicillin resistant staph aureus) culture positive    Myocardial infarction ([place])    MYOCARDIAL INFARCTION, HX OF 12/05/2008   Qualifier: Diagnosis of  By: Sidney Ace     Obesity 12/05/2008   Qualifier: Diagnosis of  By: Sidney Ace     Pneumonia    "several times"   PULMONARY NODULE, LEFT LOWER LOBE 12/05/2008   Qualifier: Diagnosis of  By: Sidney Ace     S/P angioplasty with stent 11/05/15 PCI DES to mLAD  11/06/2015   Schizo affective schizophrenia ([place]) 2002   SOB (shortness of  breath) 08/22/2016   Status post cholecystectomy 09/25/2014    Past Surgical History:  Procedure Laterality Date   APPENDECTOMY     BIOPSY  11/16/2021   Procedure: BIOPSY;  Surgeon: Rigoberto Noel, MD;  Location: [facility];  Service: Cardiopulmonary;;   BRONCHIAL BRUSHINGS  11/16/2021   Procedure: BRONCHIAL BRUSHINGS;  Surgeon: Rigoberto Noel, MD;  Location: [facility];  Service: Cardiopulmonary;;   BRONCHIAL WASHINGS  11/16/2021   Procedure: BRONCHIAL WASHINGS;  Surgeon: Rigoberto Noel, MD;  Location: [facility];  Service: Cardiopulmonary;;   CARDIAC CATHETERIZATION N/A 11/05/2015   Procedure: Left Heart Cath and Coronary Angiography;  Surgeon: Nelva Bush, MD;  Location: [place] [facility];  Service: Cardiovascular;  Laterality: N/A;   CARDIAC CATHETERIZATION N/A 11/05/2015   Procedure: Coronary Stent Intervention;  Surgeon: Nelva Bush, MD;  Location: [place] [facility];  Service: Cardiovascular;  Laterality: N/A;   CARDIAC CATHETERIZATION N/A 11/05/2015   Procedure: Intravascular Pressure Wire/FFR Study;  Surgeon: Nelva Bush, MD;  Location: [place] [facility];  Service: Cardiovascular;  Laterality: N/A;   CORONARY ANGIOPLASTY WITH STENT PLACEMENT  2010   90%  RCA s/p BMS x 2, 50% LAD, 30% CFX, EF 45-50%   CORONARY ANGIOPLASTY WITH STENT PLACEMENT  11/05/2015   "1 today; makes a total of 4 stents" (11/05/2015)   ENDOBRONCHIAL ULTRASOUND Bilateral 11/16/2021   Procedure: ENDOBRONCHIAL ULTRASOUND;  Surgeon: Rigoberto Noel, MD;  Location: [facility];  Service: Cardiopulmonary;  Laterality: Bilateral;   FINE NEEDLE ASPIRATION BIOPSY  11/16/2021   Procedure: FINE NEEDLE ASPIRATION BIOPSY;  Surgeon: Rigoberto Noel, MD;  Location: [facility];  Service: Cardiopulmonary;;   HEMOSTASIS CONTROL  11/16/2021   Procedure: HEMOSTASIS CONTROL;  Surgeon: Rigoberto Noel, MD;  Location: [facility];  Service: Cardiopulmonary;;   INGUINAL HERNIA REPAIR Right    KNEE ARTHROSCOPY Left X 3    LAPAROSCOPIC CHOLECYSTECTOMY     TONSILLECTOMY AND ADENOIDECTOMY     UPPER GI ENDOSCOPY  05/24/2016   UGI ENDO INCLUDE ESOPHAGUS, STOMACH & DUODENUM &/OR JEJUNUM; DX W/WO COLLECTION SPECIMEN BY BRUSH OR [place]; SURGEON :ALBERT San Morelle, MD LOCATION; [facility]    Social History:  reports that he has been smoking cigarettes. He has a 42.00 pack-year smoking history. He has never used smokeless tobacco. He reports that he does not drink alcohol and does not use drugs.   Allergies  Allergen Reactions   Rocephin [Ceftriaxone] Anaphylaxis   Zofran [Ondansetron Hcl] Swelling    sts had tongue swelling and trouble breathing   Ampicillin     Per MAR    Lipitor [Atorvastatin]     Per MAR    Toradol [Ketorolac Tromethamine] Nausea And Vomiting    Pt states he is not allergic to ibuprofen   Tramadol Nausea And Vomiting    Not listed on MAR     Family History  Problem Relation Age of Onset   Heart Problems Mother        49   Heart disease Father        9    Family history reviewed and not pertinent    Prior to Admission medications   Medication Sig Start Date End Date Taking? Authorizing Provider  acetaminophen (TYLENOL) 325 MG tablet Take 650 mg by mouth every 6 (six) hours as needed (pain.).    [provider]  albuterol (PROVENTIL) (2.5 MG/3ML) 0.083% nebulizer solution Take 2.5 mg by nebulization every 6 (six) hours as needed for wheezing or shortness of breath.    [provider]  albuterol (VENTOLIN HFA) 108 (90 Base) MCG/ACT inhaler Inhale 2 puffs into the lungs every 6 (six) hours as needed for wheezing or shortness of breath.    [provider]  allopurinol (ZYLOPRIM) 100 MG tablet Take 100 mg by mouth daily. (0900) 11/27/20   [provider]  aspirin EC 81 MG tablet Take 1 tablet (81 mg total) by mouth daily. Swallow whole. 11/03/21   O'Neal, Cassie Freer, MD  calcium-vitamin D (OSCAL WITH D) 500-5 MG-MCG tablet Take 1 tablet by  mouth 2 (two) times daily. 12/20/21   Curt Bears, MD  cyclobenzaprine (FLEXERIL) 10 MG tablet Take 10 mg by mouth 3 (three) times daily. (0900, 1300 & 2100)    [provider]  ferrous sulfate 325 (65 FE) MG tablet Take 325 mg by mouth in the morning and at bedtime. (0800 & 1600)    [provider]  Fluticasone-Umeclidin-Vilant (TRELEGY ELLIPTA) 200-62.5-25 MCG/ACT AEPB Inhale 1 puff into the lungs daily. 10/26/21   Cobb, Karie Schwalbe, NP  furosemide (LASIX) 20 MG tablet Take 20 mg by mouth in the morning. (  0900) 11/27/20   [provider]  gabapentin (NEURONTIN) 400 MG capsule Take 400 mg by mouth 3 (three) times daily. (0900, 1300 & 2100)    [provider]  Glucagon, rDNA, (GLUCAGON EMERGENCY) 1 MG KIT Inject 1 mg as directed as needed (hypoglycemia).    [provider]  HUMALOG KWIKPEN 100 UNIT/ML KwikPen Inject 0-10 Units into the skin in the morning, at noon, in the evening, and at bedtime. Per Sliding Scale: 0-150=0 units 151-200=2 units 201-250=4 units 251-350=8 units 351-400=10 units Patient not taking: Reported on 12/20/2021 03/04/21   [provider]  ipratropium-albuterol (DUONEB) 0.5-2.5 (3) MG/3ML SOLN Inhale 3 mLs into the lungs every 4 (four) hours as needed (wheezing/COPD).    [provider]  levothyroxine (SYNTHROID) 75 MCG tablet Take 75 mcg by mouth daily before breakfast.    [provider]  LORazepam (ATIVAN) 2 MG tablet Take 2 mg by mouth every 12 (twelve) hours. (0800 & 2000)    [provider]  metFORMIN (GLUCOPHAGE) 500 MG tablet Take 500 mg by mouth 2 (two) times daily with a meal. (0900 & 1700)    [provider]  metoprolol succinate (TOPROL-XL) 25 MG 24 hr tablet Take 25 mg by mouth in the morning. (0900) 04/02/18   [provider]  omeprazole (PRILOSEC) 20 MG capsule Take 20 mg by mouth in the morning.    [provider]  Oxycodone HCl 10 MG TABS Take 10 mg  by mouth every 6 (six) hours as needed (pain).    [provider]  polyethylene glycol (MIRALAX / GLYCOLAX) packet Take 17 g by mouth daily as needed for mild constipation.     [provider]  pravastatin (PRAVACHOL) 80 MG tablet Take 80 mg by mouth at bedtime. (2000)    [provider]  prochlorperazine (COMPAZINE) 10 MG tablet Take 1 tablet (10 mg total) by mouth every 6 (six) hours as needed for nausea or vomiting. 11/29/21   Heilingoetter, Cassandra L, PA-C  QUEtiapine (SEROQUEL) 100 MG tablet Take 100 mg by mouth at bedtime. (2000)    [provider]  saccharomyces boulardii (FLORASTOR) 250 MG capsule Take 250 mg by mouth 2 (two) times daily. (0900 & 2100)    [provider]  temazepam (RESTORIL) 30 MG capsule Take 30 mg by mouth at bedtime. (2000)    [provider]  therapeutic multivitamin-minerals (THERAGRAN-M) tablet Take 1 tablet by mouth in the morning. (0900)    [provider]  venlafaxine (EFFEXOR) 75 MG tablet Take 75 mg by mouth in the morning. (0900)    [provider]     Objective    Physical Exam: Vitals:   02/02/22 2200 02/02/22 2215 02/02/22 2240 02/02/22 2245  BP: 106/70 101/69  112/82  Pulse: (!) 114 (!) 113  (!) 112  Resp: 16 (!) 24  16  Temp:   99.5 F (37.5 C)   TempSrc:   Rectal   SpO2: 99% 99%  98%  Weight:      Height:        General: appears to be stated age; alert, oriented Skin: warm, dry, no rash Head:  AT/[place] Mouth:  Oral mucosa membranes appear dry, normal dentition Neck: supple; trachea midline Heart:  RRR; did not appreciate any M/R/G Lungs: CTAB, did not appreciate any wheezes, rales, or rhonchi Abdomen: + BS; soft, ND, NT Vascular: 2+ pedal pulses b/l; 2+ radial pulses b/l Extremities: no peripheral edema, no muscle wasting Neuro: strength  and sensation intact in upper and lower extremities b/l   Labs on Admission: I have personally reviewed following labs and  imaging studies  CBC: Recent Labs  Lab 02/02/22 2042  WBC 6.1  NEUTROABS 4.8  HGB 7.6*  HCT 22.8*  MCV 82.9  PLT 481*   Basic Metabolic Panel: Recent Labs  Lab 02/02/22 2042  NA 135  K 3.4*  CL 96*  CO2 23  GLUCOSE 94  BUN 31*  CREATININE 1.43*  CALCIUM 5.1*   GFR: Estimated Creatinine Clearance: 54.7 mL/min (A) (by C-G formula based on SCr of 1.43 mg/dL (H)). Liver Function Tests: Recent Labs  Lab 02/02/22 2042  AST 82*  ALT 26  ALKPHOS 78  BILITOT 0.7  PROT 6.3*  ALBUMIN 2.3*   No results for input(s): "LIPASE", "AMYLASE" in the last 168 hours. No results for input(s): "AMMONIA" in the last 168 hours. Coagulation Profile: Recent Labs  Lab 02/02/22 2042  INR 1.6*   Cardiac Enzymes: No results for input(s): "CKTOTAL", "CKMB", "CKMBINDEX", "TROPONINI" in the last 168 hours. BNP (last 3 results) No results for input(s): "PROBNP" in the last 8760 hours. HbA1C: No results for input(s): "HGBA1C" in the last 72 hours. CBG: No results for input(s): "GLUCAP" in the last 168 hours. Lipid Profile: No results for input(s): "CHOL", "HDL", "LDLCALC", "TRIG", "CHOLHDL", "LDLDIRECT" in the last 72 hours. Thyroid Function Tests: No results for input(s): "TSH", "T4TOTAL", "FREET4", "T3FREE", "THYROIDAB" in the last 72 hours. Anemia Panel: No results for input(s): "VITAMINB12", "FOLATE", "FERRITIN", "TIBC", "IRON", "RETICCTPCT" in the last 72 hours. Urine analysis:    Component Value Date/Time   COLORURINE YELLOW 10/10/2016 [place] 10/10/2016 1247   LABSPEC 1.003 (L) 10/10/2016 1247   PHURINE 6.0 10/10/2016 1247   GLUCOSEU NEGATIVE 10/10/2016 1247   HGBUR SMALL (A) 10/10/2016 1247   BILIRUBINUR NEGATIVE 10/10/2016 1247   KETONESUR NEGATIVE 10/10/2016 1247   PROTEINUR NEGATIVE 10/10/2016 1247   UROBILINOGEN 1.0 10/23/2014 0540   NITRITE NEGATIVE 10/10/2016 1247   LEUKOCYTESUR NEGATIVE 10/10/2016 1247    Radiological Exams on Admission: DG  Chest Port 1 View  Result Date: 02/02/2022 CLINICAL DATA:  Questionable sepsis EXAM: PORTABLE CHEST 1 VIEW COMPARISON:  CT chest 10/26/2021.  Chest x-ray 11/24/2020. FINDINGS: Small right pleural effusion with right lower lung airspace opacities extending to the right hilum appears stable. Cardiomediastinal silhouette is within normal limits. No evidence for pneumothorax. No acute fractures are seen. Stable nodular density in the left lower lung. IMPRESSION: 1. Stable small right pleural effusion with right lower lung airspace opacities extending to the right hilum. 2. Stable nodular density in the left lower lung. Electronically Signed   By: Ronney Asters M.D.   On: 02/02/2022 21:30      Assessment/Plan   Principal Problem:   Generalized weakness Active Problems:   Acquired hypothyroidism   DM2 (diabetes mellitus, type 2) (HCC)   HLD (hyperlipidemia)   Anemia of chronic disease   Hypocalcemia   CAP (community acquired pneumonia)   Hypokalemia   AKI (acute kidney injury) ([place])   Dehydration   Chronic diastolic CHF (congestive heart failure) (HCC)   History of COPD   GAD (generalized anxiety disorder)      #) Generalized weakness:  at least 3-4 day  duration of generalized weakness, in the absence of any evidence of acute focal neurologic deficits, including no evidence of acute focal weakness to suggest acute CVA.  Suspect contribution from physiologic stress stemming from presenting dehydration resulting  in acute kidney injury, potentially resulting in diminished renal clearance of several central acting medications taken as an outpatient, with additional potential physiologic stressors stemming from suspected community-acquired pneumonia, as further detailed below.  No e/o additional infectious process at this time, including negative COVID, influenza, RSV, We will urinalysis is not suggestive of UTI.  All of this is superimposed on underlying nonsmall cell lung cancer.    Additionally, in the setting of AKI, with urinalysis showing large hemoglobin but no RBCs, will also add on CPK level. Will further eval for any additional contributions from endocrine/metabolic sources, as detailed below.    Plan: Gentle additional IV fluids in the form of lactated Ringer's at 100 cc/h x 8 additional hours.  PT consult ordered for the AM. Fall precautions. CMP/CBC in the AM. Check TSH, serum Mg level. Check CPK level.  Hold home gabapentin for now.  Further evaluation management suspected community-acquired pneumonia, as further detailed below.           #) Hypocalcemia: Presenting adjusted calcium level 6.5, slightly lower than most recent prior adjusted calcium level of 7.1 on 12/20/2021.  Potential contribution from outpatient Lasix.  It is also noted that the patient is on scheduled calcium/vitamin D supplementation as outpatient.  He is status post receipt of 1 g of IV calcium gluconate in the ED this evening.  Will also add on serum magnesium level.   Plan: Add on serum magnesium level.  Calcium gluconate 1 g IV over 1 hour x 1 additional dose now.  CMP in the morning.  Check serum phosphorus level.  Hold home Lasix for now.  Monitor strict I's and O's and daily weights.            #) Community-acquired pneumonia: Suspected diagnosis in the setting of 3 to 4 days of reported new onset mildly productive cough, with chest x-ray showing interval evidence of right lower lobe airspace opacity concerning for infiltrate.  In the absence of objective fever or leukocytosis, SIRS criteria not met for sepsis at this time.  Additionally, lactic acid nonelevated.  Blood cultures x 2 collected in the ED this evening prior to initiation of Levaquin.  COVID, influenza, and RSV all found to be negative, as further detailed above.  Plan: Monitor for results of blood cultures x 2.  Continue Levaquin.  Add on procalcitonin level.  Add on BNP.  Repeat CBC in the morning.  And  status from a tree.            #) Hypokalemia: Presenting serum potassium level mildly low at 3.4.  Has already received 2 L of LR in the ED this evening.  Given concomitant acute kidney injury, will refrain from dedicated potassium supplementation at this time, but rather proceed with additional continuous lactated Ringer's and evaluate serum magnesium level, as further detailed below.   Plan: Continuous lactic Ringer's, as above.  Add on some magnesium level.  Monitor on telemetry.  Repeat CMP in the morning.  Holding home Lasix for now, as above.              #) Acute Kidney Injury:  as quantified above.  It appears prerenal in nature, with clinical evidence to suggest dehydration.  This appears consistent with laboratory findings that include evidence of acute prerenal azotemia as well as the presence hyaline casts on urinalysis.   Plan: monitor strict I's & O's and daily weights. Attempt to avoid nephrotoxic agents.  Hold home gabapentin and Lasix for now.  Refrain from NSAIDs. Repeat CMP in the morning. Check serum magnesium level. Add-on random urine sodium and random urine creatinine.  Add on CPK level, as above.  Continuous lactated Ringer's, as above. if renal function does not improve with the above measures, can consider obtaining a renal US to evaluate for parenchymal abnormality as well as to assess for evidence of post-renal obstructive process.                 #) Dehydration: Clinical suspicion for such, including the appearance of dry oral mucous membranes as well as laboratory findings notable for acute prerenal azotemia and UA demonstrating the presence of hyaline casts.  Appears to be in the setting of diminished oral intake, and is also consistent with resolution of mild initial soft blood pressures with interval IV fluids.     Plan: Monitor strict I's and O's.  Daily weights.  Repeat BMP in the morning. IVF's , as  above.              #) Generalized anxiety disorder: Documented history of such, on Effexor as well as scheduled Ativan as an outpatient.  Plan: Continue home Effexor.  Will continue Ativan, but on a as needed basis during this hospitalization.               #) Chronic diastolic heart failure: documented history of such, with most recent echocardiogram performed in October 2023, with results notable for LVEF 55 to 60% as well as indeterminate diastolic parameters, with additional results as conveyed above. No clinical or radiographic evidence to suggest acutely decompensated heart failure at this time. home diuretic regimen reportedly consists of the following: Lasix, which may also be contributory to the patient's presenting hypocalcemia.    Plan: monitor strict I's & O's and daily weights. Repeat CMP in AM. Check serum mag level.  Hold home Lasix for now.  Add on BMP.           #) COPD: Documented history thereof, without clinical evidence of acute exacerbation at this time.   Outpatient respiratory regimen includes the following: Trelegy, as needed albuterol nebulizer.   Plan: cont outpatient Trelegy. Prn albuterol nebulizer. Check CMP and serum magnesium level in the AM.            #) Hyperlipidemia: Recommend history of such, pravastatin is not patient.    Plan: Continue on pravastatin.  Follow-up results of CPK level.           #) Anemia of chronic disease: Documented history of such, a/w with baseline hgb range of 7-9, with presenting hgb consistent with this range, in the absence of any overt evidence of active bleed.  Mild elevation in INR, PTT noted.  Platelet count also noted to be borderline low.  Will repeat platelet count with CBC in the morning as well as repeat INR in the morning, with potential for expansion of DIC workup depending upon associated interval trend of these labs.  Is noted to be on daily oral iron supplementation as  an outpatient.  Prickly in the setting of presenting generalized weakness, will also assess iron studies for room for optimization from IV supplementation    Plan: Repeat CBC in the morning.  Repeat INR in the morning.  Add on iron studies.                   #) acquired hypothyroidism: documented h/o such, on Synthroid as outpatient.   Plan: cont home Synthroid.  Check TSH.  DVT prophylaxis: SCD's   Code Status: Full code Family Communication: none Disposition Plan: Per Rounding Team Consults called: none;  Admission status: Inpatient     I SPENT GREATER THAN 75  MINUTES IN CLINICAL CARE TIME/MEDICAL DECISION-MAKING IN COMPLETING THIS ADMISSION.      [place] DO [facility]  From [place]

## 2022-02-03 NOTE — ED Notes (Signed)
Pt's condom cath and sheets changed at this time.

## 2022-02-03 NOTE — ED Notes (Signed)
Admitting MD notified of critical mag level orders placed

## 2022-02-03 NOTE — Progress Notes (Signed)
PROGRESS NOTE    [name]  JOA:[id number] DOB: 09-09-1955 DOA: 02/02/2022 PCP: [name], MD   Brief Narrative:  Patient is a 66 year old Caucasian male with past medical history significant for non-small cell lung cancer, chronic diastolic CHF, COPD, diabetes mellitus type 2, history of anemia of chronic disease with a baseline hemoglobin 7-9, acquired hypothyroidism as well as other comorbidities who presented to the ED with generalized weakness of 3 to 4 days in the absence of any acute focal deficits or paresthesias.  Patient noted that last 3 to 4 days he had a progressively mild productive cough with no hemoptysis and denies any fevers, chills, and felt more weak with difficulty ambulating and states that he did not fall and almost multiple times during his generalized weakness.  Had no worsening peripheral edema and sees Dr. Earlie Server in outpatient setting for his lung cancer.  In the ED he was brought in and had a Tmax of 99.5 with a heart rate of 111 and was given IV fluid boluses with lactated Ringer's and initiated on Levaquin.  He is admitted for generalized weakness and community-acquired pneumonia and also admitted for AKI, dehydration and lab abnormalities including hypocalcemia and hypokalemia.   Assessment and Plan: No notes have been filed under this hospital service. Service: Hospitalist  Generalized weakness -Likely in the setting of electrolyte abnormalities and dysfunction -Will need PT and OT to further evaluate and treat and will get a fluid hydration.  No infectious process noted -Urinalysis is not suggestive of UTI -Patient has been likely dehydrated we will continue IV for hydration and check a TSH and mag level Continue to hold gabapentin -Patient may have a community-acquired pneumonia and so is getting Levaquin  Hypocalcemia -Being replete with IV calcium gluconate Picture monitor trend and repeat CMP in the a.m. and hold Lasix for  now  Community-acquired pneumonia -Has a mildly associated productive cough and an oxygen requirement -Blood cultures x 2 pending and has been initiated on levofloxacin From -Influenza panel negative -Repeat chest x-ray in the a.m. -Will make further adjustments to his regimen and continue Trelegy but will add Xopenex and guaifenesin 1200 malaise.  Twice daily, incentive spirometry as well as flutter valve -Repeat chest x-ray in the a.m. -Will need an amatory home O2 screen prior to discharge  Anemia of chronic disease The patient's hemoglobin/hematocrit is now 7.6/20.2 From anemia panel done and showed an iron level of 22, UIBC 104, TIBC 126, saturation ratios of 18%, ferritin level 1122 -Continue with ferrous sulfate 3 x 1 mg p.o. daily -Continue to monitor for signs and symptoms of bleeding; no overt bleeding noted For repeat CBC in a.m.  Elevated CK -IV fluid hydration given but will resume normal saline at 75 mL/h and will need to monitor and repeat CK in the morning and that his CK on admission was greater than 3000  Hypothyroidism Continue with levothyroxine  Hypokalemia -Replete and improved  AKI -Suggestive of dehydration and elevated CK -Avoid nephrotoxic medications, contrast dyes and hold Lasix as well as renally adjust medications and avoid hypotension to ensure adequate renal perfusion -Resume IV fluid hydration at normal saline at 75 MLS per hour -Strict I's and O's -Patient's BUNs/creatinine has gone from 31/1.43 is now 22/1.12 -To monitor and trend renal function carefully repeat CMP in a.m.  GAD -Continue home Effexor and continue Ativan  Chronic diastolic CHF -Most recent echocardiogram was performed in October 2023 which showed an LVEF of 55 to 60% with indeterminate diastolic  parameters -Cautious IV fluid hydration and will need to continue monitor for signs and symptoms of volume overload -Holding home Lasix for now  COPD -Continue Trelegy and added  Xopenex -Also on as needed albuterol and also added guaifenesin, flutter valve and incentive spirometry  Hyperlipidemia -Hold his pravastatin given his elevated CPK  Hypomagnesemia -Patient's magnesium level was 0.6 -Replete with IV mag sulfate 4 g -Continue to monitor and trend and repeat magnesium level in the a.m.  DVT prophylaxis: SCDs Start: 02/03/22 0019    Code Status: Full Code Family Communication: No family currently at bedside  Disposition Plan:  Level of care: Progressive Status is: Inpatient Remains inpatient appropriate because: Will need further clinical improvement and evaluated by PT and OT as well as improvement in fluid hydration status and he will need an amatory home O2 screen prior to discharge   Consultants:  None  Procedures:  As above  Antimicrobials:  Anti-infectives (From admission, onward)    Start     Dose/Rate Route Frequency Ordered Stop   02/03/22 2100  levofloxacin (LEVAQUIN) IVPB 750 mg        750 mg 100 mL/hr over 90 Minutes Intravenous Every 24 hours 02/02/22 2251 02/07/22 2059   02/02/22 2115  levofloxacin (LEVAQUIN) IVPB 750 mg        750 mg 100 mL/hr over 90 Minutes Intravenous  Once 02/02/22 2104 02/02/22 2307        Subjective: Seen and examined at bedside and states that he is still feeling weak and felt short of breath little bit.  No nausea or vomiting.  Denies any lightheadedness or dizziness.  States that he might be doing a little bit better since coming in.  No other concerns or complaints at this time.  Objective: Vitals:   02/03/22 1400 02/03/22 1500 02/03/22 1530 02/03/22 1635  BP: 104/78 110/81    Pulse: 89 93 94   Resp: 12 (!) 24 14   Temp:    97.6 F (36.4 C)  TempSrc:    Oral  SpO2: 100% 100% 100%   Weight:      Height:        Intake/Output Summary (Last 24 hours) at 02/03/2022 1836 Last data filed at 02/03/2022 1706 Gross per 24 hour  Intake 2250 ml  Output 3200 ml  Net -950 ml   Filed Weights    02/02/22 2039  Weight: 77.1 kg   Examination: Physical Exam:  Constitutional: Thin chronically appearing Caucasian male currently no acute distress Respiratory: Diminished to auscultation bilaterally with coarse breath sounds, no wheezing, rales, rhonchi or crackles. Normal respiratory effort and patient is not tachypenic. No accessory muscle use.  Unlabored breathing Cardiovascular: RRR, no murmurs / rubs / gallops. S1 and S2 auscultated. No extremity edema.  Abdomen: Soft, non-tender, slightly distended. Bowel sounds positive.  GU: Deferred. Musculoskeletal: No clubbing / cyanosis of digits/nails. No joint deformity upper and lower extremities.  Skin: No rashes, lesions, ulcers on limited skin evaluation. No induration; Warm and dry.  Neurologic: CN 2-12 grossly intact with no focal deficits. Romberg sign cerebellar reflexes not assessed.  Psychiatric: Normal judgment and insight. Alert and oriented x 3. Normal mood and appropriate affect.   Data Reviewed: I have personally reviewed following labs and imaging studies  CBC: Recent Labs  Lab 02/02/22 2042 02/03/22 0331  WBC 6.1 7.2  NEUTROABS 4.8  --   HGB 7.6* 7.6*  HCT 22.8* 23.2*  MCV 82.9 84.4  PLT 145* 956   Basic Metabolic  Panel: Recent Labs  Lab 02/02/22 2042 02/03/22 0331  NA 135 136  K 3.4* 3.5  CL 96* 100  CO2 23 21*  GLUCOSE 94 84  BUN 31* 22  CREATININE 1.43* 1.12  CALCIUM 5.1* 5.7*  MG  --  0.6*  PHOS  --  2.9   GFR: Estimated Creatinine Clearance: 69.8 mL/min (by C-G formula based on SCr of 1.12 mg/dL). Liver Function Tests: Recent Labs  Lab 02/02/22 2042 02/03/22 0331  AST 82* 72*  ALT 26 24  ALKPHOS 78 74  BILITOT 0.7 0.9  PROT 6.3* 6.1*  ALBUMIN 2.3* 2.2*   No results for input(s): "LIPASE", "AMYLASE" in the last 168 hours. No results for input(s): "AMMONIA" in the last 168 hours. Coagulation Profile: Recent Labs  Lab 02/02/22 2042 02/03/22 0331  INR 1.6* 1.6*   Cardiac  Enzymes: Recent Labs  Lab 02/03/22 0331  CKTOTAL 3,658*   BNP (last 3 results) No results for input(s): "PROBNP" in the last 8760 hours. HbA1C: No results for input(s): "HGBA1C" in the last 72 hours. CBG: Recent Labs  Lab 02/03/22 0744 02/03/22 1239 02/03/22 1702  GLUCAP 83 126* 106*   Lipid Profile: No results for input(s): "CHOL", "HDL", "LDLCALC", "TRIG", "CHOLHDL", "LDLDIRECT" in the last 72 hours. Thyroid Function Tests: Recent Labs    02/03/22 0331  TSH 4.812*   Anemia Panel: Recent Labs    02/03/22 0331  FERRITIN 1,122*  TIBC 126*  IRON 22*   Sepsis Labs: Recent Labs  Lab 02/02/22 2042 02/03/22 0331  PROCALCITON  --  0.76  LATICACIDVEN 1.2  --     Recent Results (from the past 240 hour(s))  Blood Culture (routine x 2)     Status: None (Preliminary result)   Collection Time: 02/02/22  8:42 PM   Specimen: BLOOD  Result Value Ref Range Status   Specimen Description BLOOD LEFT ANTECUBITAL  Final   Special Requests   Final    BOTTLES DRAWN AEROBIC AND ANAEROBIC Blood Culture adequate volume   Culture   Final    NO GROWTH < 24 HOURS Performed at [facility], [place] [address], [place], [place] [address]    Report Status PENDING  Incomplete  Resp panel by RT-PCR (RSV, Flu A&B, Covid) Anterior Nasal Swab     Status: None   Collection Time: 02/02/22  9:03 PM   Specimen: Anterior Nasal Swab  Result Value Ref Range Status   SARS Coronavirus 2 by RT PCR NEGATIVE NEGATIVE Final    Comment: (NOTE) SARS-CoV-2 target nucleic acids are NOT DETECTED.  The SARS-CoV-2 RNA is generally detectable in upper respiratory specimens during the acute phase of infection. The lowest concentration of SARS-CoV-2 viral copies this assay can detect is 138 copies/mL. A negative result does not preclude SARS-Cov-2 infection and should not be used as the sole basis for treatment or other patient management decisions. A negative result may occur with  improper specimen  collection/handling, submission of specimen other than nasopharyngeal swab, presence of viral mutation(s) within the areas targeted by this assay, and inadequate number of viral copies(<138 copies/mL). A negative result must be combined with clinical observations, patient history, and epidemiological information. The expected result is Negative.  Fact Sheet for Patients:  EntrepreneurPulse.com.au  Fact Sheet for Healthcare Providers:  IncredibleEmployment.be  This test is no t yet approved or cleared by the [place] FDA and  has been authorized for detection and/or diagnosis of SARS-CoV-2 by FDA under an Emergency Use Authorization (EUA).  This EUA will remain  in effect (meaning this test can be used) for the duration of the COVID-19 declaration under Section 564(b)(1) of the Act, 21 U.S.C.section 360bbb-3(b)(1), unless the authorization is terminated  or revoked sooner.       Influenza A by PCR NEGATIVE NEGATIVE Final   Influenza B by PCR NEGATIVE NEGATIVE Final    Comment: (NOTE) The Xpert Xpress SARS-CoV-2/FLU/RSV plus assay is intended as an aid in the diagnosis of influenza from Nasopharyngeal swab specimens and should not be used as a sole basis for treatment. Nasal washings and aspirates are unacceptable for Xpert Xpress SARS-CoV-2/FLU/RSV testing.  Fact Sheet for Patients: EntrepreneurPulse.com.au  Fact Sheet for Healthcare Providers: IncredibleEmployment.be  This test is not yet approved or cleared by the [place] FDA and has been authorized for detection and/or diagnosis of SARS-CoV-2 by FDA under an Emergency Use Authorization (EUA). This EUA will remain in effect (meaning this test can be used) for the duration of the COVID-19 declaration under Section 564(b)(1) of the Act, 21 U.S.C. section 360bbb-3(b)(1), unless the authorization is terminated or revoked.     Resp Syncytial  Virus by PCR NEGATIVE NEGATIVE Final    Comment: (NOTE) Fact Sheet for Patients: EntrepreneurPulse.com.au  Fact Sheet for Healthcare Providers: IncredibleEmployment.be  This test is not yet approved or cleared by the [place] FDA and has been authorized for detection and/or diagnosis of SARS-CoV-2 by FDA under an Emergency Use Authorization (EUA). This EUA will remain in effect (meaning this test can be used) for the duration of the COVID-19 declaration under Section 564(b)(1) of the Act, 21 U.S.C. section 360bbb-3(b)(1), unless the authorization is terminated or revoked.  Performed at [facility], [place] [address], [place], [place] [address]   Blood Culture (routine x 2)     Status: None (Preliminary result)   Collection Time: 02/03/22  3:41 AM   Specimen: BLOOD RIGHT HAND  Result Value Ref Range Status   Specimen Description BLOOD RIGHT HAND  Final   Special Requests   Final    BOTTLES DRAWN AEROBIC ONLY Blood Culture results may not be optimal due to an inadequate volume of blood received in culture bottles   Culture   Final    NO GROWTH < 12 HOURS Performed at [facility], [place] [address], [place], [place] [address]    Report Status PENDING  Incomplete     Radiology Studies: DG Chest Port 1 View  Result Date: 02/02/2022 CLINICAL DATA:  Questionable sepsis EXAM: PORTABLE CHEST 1 VIEW COMPARISON:  CT chest 10/26/2021.  Chest x-ray 11/24/2020. FINDINGS: Small right pleural effusion with right lower lung airspace opacities extending to the right hilum appears stable. Cardiomediastinal silhouette is within normal limits. No evidence for pneumothorax. No acute fractures are seen. Stable nodular density in the left lower lung. IMPRESSION: 1. Stable small right pleural effusion with right lower lung airspace opacities extending to the right hilum. 2. Stable nodular density in the left lower lung. Electronically Signed   By: Ronney Asters M.D.   On: 02/02/2022 21:30    Scheduled Meds:  aspirin EC  81 mg Oral Daily   ferrous sulfate  325 mg Oral Q breakfast   fluticasone furoate-vilanterol  1 puff Inhalation Daily   And   umeclidinium bromide  1 puff Inhalation Daily   insulin aspart  0-6 Units Subcutaneous TID WC   levothyroxine  75 mcg Oral QAC breakfast   metoprolol succinate  25 mg Oral Daily  pravastatin  80 mg Oral QHS   QUEtiapine  100 mg Oral QHS   venlafaxine  75 mg Oral Q breakfast   Continuous Infusions:  levofloxacin (LEVAQUIN) IV      LOS: 1 day   Raiford Noble, DO [facility] Available via Epic secure chat 7am-7pm After these hours, please refer to coverage provider listed on [email or web address] 02/03/2022, 6:36 PM

## 2022-02-03 NOTE — ED Notes (Signed)
Critical calcium of 5.7 communicated to Howerter, DO at this time.

## 2022-02-04 ENCOUNTER — Inpatient Hospital Stay (HOSPITAL_COMMUNITY): Payer: Medicare HMO

## 2022-02-04 DIAGNOSIS — E039 Hypothyroidism, unspecified: Secondary | ICD-10-CM | POA: Diagnosis not present

## 2022-02-04 DIAGNOSIS — N179 Acute kidney failure, unspecified: Secondary | ICD-10-CM | POA: Diagnosis not present

## 2022-02-04 DIAGNOSIS — J189 Pneumonia, unspecified organism: Secondary | ICD-10-CM | POA: Diagnosis not present

## 2022-02-04 DIAGNOSIS — R531 Weakness: Secondary | ICD-10-CM | POA: Diagnosis not present

## 2022-02-04 DIAGNOSIS — Z7189 Other specified counseling: Secondary | ICD-10-CM

## 2022-02-04 DIAGNOSIS — C3491 Malignant neoplasm of unspecified part of right bronchus or lung: Secondary | ICD-10-CM

## 2022-02-04 LAB — GLUCOSE, CAPILLARY: Glucose-Capillary: 160 mg/dL — ABNORMAL HIGH (ref 70–99)

## 2022-02-04 LAB — CBC WITH DIFFERENTIAL/PLATELET
Abs Immature Granulocytes: 0.04 10*3/uL (ref 0.00–0.07)
Basophils Absolute: 0 10*3/uL (ref 0.0–0.1)
Basophils Relative: 0 %
Eosinophils Absolute: 0 10*3/uL (ref 0.0–0.5)
Eosinophils Relative: 0 %
HCT: 23.7 % — ABNORMAL LOW (ref 39.0–52.0)
Hemoglobin: 7.5 g/dL — ABNORMAL LOW (ref 13.0–17.0)
Immature Granulocytes: 1 %
Lymphocytes Relative: 5 %
Lymphs Abs: 0.4 10*3/uL — ABNORMAL LOW (ref 0.7–4.0)
MCH: 27.8 pg (ref 26.0–34.0)
MCHC: 31.6 g/dL (ref 30.0–36.0)
MCV: 87.8 fL (ref 80.0–100.0)
Monocytes Absolute: 0.4 10*3/uL (ref 0.1–1.0)
Monocytes Relative: 5 %
Neutro Abs: 6.4 10*3/uL (ref 1.7–7.7)
Neutrophils Relative %: 89 %
Platelets: 154 10*3/uL (ref 150–400)
RBC: 2.7 MIL/uL — ABNORMAL LOW (ref 4.22–5.81)
RDW: 20.1 % — ABNORMAL HIGH (ref 11.5–15.5)
WBC: 7.2 10*3/uL (ref 4.0–10.5)
nRBC: 0 % (ref 0.0–0.2)

## 2022-02-04 LAB — CBG MONITORING, ED
Glucose-Capillary: 197 mg/dL — ABNORMAL HIGH (ref 70–99)
Glucose-Capillary: 166 mg/dL — ABNORMAL HIGH (ref 70–99)

## 2022-02-04 LAB — PHOSPHORUS: Phosphorus: 2.1 mg/dL — ABNORMAL LOW (ref 2.5–4.6)

## 2022-02-04 LAB — COMPREHENSIVE METABOLIC PANEL
ALT: 24 U/L (ref 0–44)
AST: 64 U/L — ABNORMAL HIGH (ref 15–41)
Albumin: 2 g/dL — ABNORMAL LOW (ref 3.5–5.0)
Alkaline Phosphatase: 77 U/L (ref 38–126)
Anion gap: 12 (ref 5–15)
BUN: 9 mg/dL (ref 8–23)
CO2: 25 mmol/L (ref 22–32)
Calcium: 5.8 mg/dL — CL (ref 8.9–10.3)
Chloride: 96 mmol/L — ABNORMAL LOW (ref 98–111)
Creatinine, Ser: 1.1 mg/dL (ref 0.61–1.24)
GFR, Estimated: >60 mL/min (ref >60)
Glucose, Bld: 186 mg/dL — ABNORMAL HIGH (ref 70–99)
Potassium: 2.9 mmol/L — ABNORMAL LOW (ref 3.5–5.1)
Sodium: 133 mmol/L — ABNORMAL LOW (ref 135–145)
Total Bilirubin: 0.6 mg/dL (ref 0.3–1.2)
Total Protein: 5.7 g/dL — ABNORMAL LOW (ref 6.5–8.1)

## 2022-02-04 LAB — CK: Total CK: 1900 U/L — ABNORMAL HIGH (ref 49–397)

## 2022-02-04 LAB — MAGNESIUM: Magnesium: 1.1 mg/dL — ABNORMAL LOW (ref 1.7–2.4)

## 2022-02-04 MED ORDER — GLYCOPYRROLATE 0.2 MG/ML IJ SOLN
0.4000 mg | INTRAMUSCULAR | Status: DC | PRN
  Administered 2022-02-05 – 2022-02-06 (×2): 0.4 mg via INTRAVENOUS
  Filled 2022-02-04 (×2): qty 2

## 2022-02-04 MED ORDER — OXYCODONE HCL 5 MG PO TABS
10.0000 mg | ORAL_TABLET | ORAL | Status: DC
  Administered 2022-02-04 – 2022-02-05 (×4): 10 mg via ORAL
  Filled 2022-02-04 (×5): qty 2

## 2022-02-04 MED ORDER — LORAZEPAM 1 MG PO TABS
1.0000 mg | ORAL_TABLET | Freq: Four times a day (QID) | ORAL | Status: DC
  Administered 2022-02-04 – 2022-02-05 (×5): 1 mg via ORAL
  Filled 2022-02-04 (×5): qty 1

## 2022-02-04 MED ORDER — CALCIUM GLUCONATE-NACL 1-0.675 GM/50ML-% IV SOLN
1.0000 g | Freq: Once | INTRAVENOUS | Status: AC
  Administered 2022-02-04: 1000 mg via INTRAVENOUS
  Filled 2022-02-04: qty 50

## 2022-02-04 MED ORDER — LEVALBUTEROL HCL 0.63 MG/3ML IN NEBU
0.6300 mg | INHALATION_SOLUTION | Freq: Four times a day (QID) | RESPIRATORY_TRACT | Status: DC | PRN
  Administered 2022-02-05 – 2022-02-06 (×2): 0.63 mg via RESPIRATORY_TRACT
  Filled 2022-02-04 (×2): qty 3

## 2022-02-04 MED ORDER — MAGNESIUM SULFATE 4 GM/100ML IV SOLN
4.0000 g | Freq: Once | INTRAVENOUS | Status: AC
  Administered 2022-02-04: 4 g via INTRAVENOUS
  Filled 2022-02-04 (×2): qty 100

## 2022-02-04 MED ORDER — POTASSIUM CHLORIDE CRYS ER 20 MEQ PO TBCR
40.0000 meq | EXTENDED_RELEASE_TABLET | Freq: Two times a day (BID) | ORAL | Status: DC
  Administered 2022-02-04: 40 meq via ORAL
  Filled 2022-02-04: qty 2

## 2022-02-04 MED ORDER — SODIUM PHOSPHATES 45 MMOLE/15ML IV SOLN
30.0000 mmol | Freq: Once | INTRAVENOUS | Status: DC
  Administered 2022-02-04: 30 mmol via INTRAVENOUS
  Filled 2022-02-04: qty 10

## 2022-02-04 MED ORDER — LORAZEPAM 2 MG/ML IJ SOLN
1.0000 mg | INTRAMUSCULAR | Status: DC | PRN
  Administered 2022-02-05 – 2022-02-06 (×4): 1 mg via INTRAVENOUS
  Filled 2022-02-04 (×4): qty 1

## 2022-02-04 MED ORDER — BIOTENE DRY MOUTH MT LIQD: 15.0000 mL | OROMUCOSAL | Status: DC | PRN

## 2022-02-04 MED ORDER — HYDROMORPHONE HCL 1 MG/ML IJ SOLN
0.5000 mg | INTRAMUSCULAR | Status: DC | PRN
  Administered 2022-02-04 – 2022-02-05 (×7): 0.5 mg via INTRAVENOUS
  Filled 2022-02-04 (×7): qty 1

## 2022-02-04 MED ORDER — POTASSIUM CHLORIDE 10 MEQ/100ML IV SOLN
10.0000 meq | INTRAVENOUS | Status: AC
  Administered 2022-02-04 (×3): 10 meq via INTRAVENOUS
  Filled 2022-02-04 (×3): qty 100

## 2022-02-04 MED ORDER — BISACODYL 10 MG RE SUPP: 10.0000 mg | Freq: Every day | RECTAL | Status: DC | PRN

## 2022-02-04 MED ORDER — POLYVINYL ALCOHOL 1.4 % OP SOLN: 1.0000 [drp] | Freq: Four times a day (QID) | OPHTHALMIC | Status: DC | PRN

## 2022-02-04 MED ORDER — LEVALBUTEROL HCL 0.63 MG/3ML IN NEBU
0.6300 mg | INHALATION_SOLUTION | Freq: Three times a day (TID) | RESPIRATORY_TRACT | Status: DC
  Administered 2022-02-04 – 2022-02-05 (×2): 0.63 mg via RESPIRATORY_TRACT
  Filled 2022-02-04 (×3): qty 3

## 2022-02-04 NOTE — Consult Note (Signed)
Consultation Note Date: 02/04/2022   Patient Name: [name]  DOB: 01-May-1955  MRN: [id number]  Age / Sex: 66 y.o., male  PCP: [name], MD Referring Physician: Rhetta Mura, DO  Reason for Consultation: Establishing goals of care  HPI/Patient Profile: 66 y.o. male  with past medical history of stage IIIb non-small cell lung cancer chronic diastolic heart failure, COPD, type 2 diabetes mellitus, anemia of chronic disease associate with baseline hemoglobin 7-9, acquired hypothyroidism  admitted on 02/02/2022 with generalized weakness from SNF.   Patient decided to stop chemotherapy and radiation on 11/13 and is now interested in comfort measures and hospice care.  PMT has been consulted to assist with goals of care conversation.  Clinical Assessment and Goals of Care:  I have reviewed medical records including EPIC notes, labs and imaging, received report from RN, assessed the patient and met at the bedside with patient to discuss diagnosis prognosis, GOC, EOL wishes, disposition and options.  I then called patient's daughter Rojelio Brenner to provide an update on our conversation.  I introduced Palliative Medicine as specialized medical care for people living with serious illness. It focuses on providing relief from the symptoms and stress of a serious illness. The goal is to improve quality of life for both the patient and the family.  We discussed a brief life review of the patient and then focused on their current illness.  The natural disease trajectory and expectations at EOL were discussed.  I attempted to elicit values and goals of care important to the patient.    Medical History Review and Understanding:  Patient understands the severity of his illness and the terminal nature of his lung cancer now that he no longer wants treatment.  Social History: Patient resides at [facility]  He is  divorced.  He has 1 daughter.  They are not in contact frequently.  Palliative Symptoms: Dyspnea, chronic back pain, new pain around his rib cage, anxiety, insomnia At its worst pain is 11 out of 10, at its best pain is 5 out of 10.  He feels he needs more pain medication around 4 hours after receiving a dose of as needed oxycodone.  He also feels the Ativan does nothing for him and he preferred outpatient medications previously tried.  Code Status: Concepts specific to code status, artifical feeding and hydration, and rehospitalization were considered and discussed.  DNR confirmed.  Discussion: Patient shares that he heard about hospice from his roommate, who enrolled about a year ago.  He understands hospice philosophy and would like to be enrolled upon discharge back to [facility].  He is at peace with his decision.   We discussed the option of comfort care in the hospital will call dating for return to [facility].  Counseled that patient would no longer receive aggressive medical interventions such as continuous vital signs, lab work, radiology testing, or medications not focused on comfort. All care would focus on how the patient is looking and feeling. This would include management of any symptoms that may cause discomfort, pain, shortness of breath, cough, nausea, agitation, anxiety, and/or secretions etc. Symptoms would be managed with medications and other non-pharmacological interventions such as spiritual support if requested, repositioning, music therapy, or therapeutic listening.  Patient verbalized understanding and appreciation.  He would like to proceed with transition to comfort care today, although he would also like to complete his course of antibiotics for pneumonia. Patient's daughter called while I was visiting with the patient.  He informed her that he was dying of lung cancer.  After the conversation, he tells me this was the first time she has heard this.  He stopped hearing  from her suddenly a few months back.  He is agreeable to this PA calling her to update her on his decisions. I then called Misty and discussed the above conversation.  Answered her questions regarding prognosis.  She verbalizes understanding and appreciation.   The difference between aggressive medical intervention and comfort care was considered in light of the patient's goals of care. Hospice services outpatient were explained and offered.   Questions and concerns were addressed. The family was encouraged to call with questions or concerns.  PMT will continue to support holistically.   SUMMARY OF RECOMMENDATIONS   -Continue DNR -Transition to comfort measures today, patient would like to continue Levaquin until he has completed entire course but focus is on comfort -Oxycodone 10 mg p.o. every 4 hours -Ativan 1 mg p.o. 4 times daily -IV Dilaudid as needed every 2 hours for breakthrough symptoms/pain/dyspnea -IV Ativan as needed every 2 hours for breakthrough symptoms/anxiety -Robinul PRN for excessive secretions -Liquifilm tears PRN for dry eyes -May have comfort feeding -TOC consulted for referral to hospice at [facility], assistance is appreciated -Spiritual care consult declined -PMT will continue to follow and support   Prognosis:  < 6 months  Discharge Planning: [place] with Hospice      Primary Diagnoses: Present on Admission:  Hypocalcemia  CAP (community acquired pneumonia)  Hypokalemia  AKI (acute kidney injury) ([place])  Dehydration  Chronic diastolic CHF (congestive heart failure) ([facility])  GAD (generalized anxiety disorder)  HLD (hyperlipidemia)  Anemia of chronic disease  Acquired hypothyroidism  Physical Exam Vitals and nursing note reviewed.  Constitutional:      General: He is not in acute distress.    Appearance: He is ill-appearing.     Interventions: Nasal cannula in place.     Comments: 4L  Cardiovascular:     Rate and Rhythm: Normal  rate.  Pulmonary:     Effort: Tachypnea present.  Neurological:     Mental Status: He is alert and oriented to person, place, and time.  Psychiatric:        Mood and Affect: Mood normal.        Behavior: Behavior normal.        Cognition and Memory: Cognition normal.    Vital Signs: BP 131/89 (BP Location: Left Arm)   Pulse 98   Temp 98.4 F (36.9 C) (Oral)   Resp (!) 28   Ht 6' (1.829 m)   Wt 77.1 kg   SpO2 98%   BMI 23.06 kg/m  Pain Scale: Faces   Pain Score: 7   SpO2: SpO2: 98 % O2 Device:SpO2: 98 % O2 Flow Rate: .O2 Flow Rate (L/min): 4 L/min    MDM: High   Analyn Matusek Johnnette Litter, PA-C  Palliative Medicine Team Team phone # [phone]  Thank you for allowing the Palliative Medicine Team to assist in the care of this patient. Please utilize secure chat with additional questions, if there is no response within 30 minutes please call the above phone number.  Palliative Medicine Team providers are available by phone from 7am to 7pm daily and can be reached through the team cell phone.  Should this patient require assistance outside of these hours, please call the patient's attending physician.

## 2022-02-04 NOTE — ED Notes (Signed)
Patient in bed resting, no s/s of any distress. No verbal c/o pain or discomfort. Will continue to monitor

## 2022-02-04 NOTE — Progress Notes (Signed)
PROGRESS NOTE    [name]  VEL:[id number] DOB: 19-Jul-1955 DOA: 02/02/2022 PCP: [name], MD   Brief Narrative:  Patient is a 66 year old Caucasian male with past medical history significant for non-small cell lung cancer, chronic diastolic CHF, COPD, diabetes mellitus type 2, history of anemia of chronic disease with a baseline hemoglobin 7-9, acquired hypothyroidism as well as other comorbidities who presented to the ED with generalized weakness of 3 to 4 days in the absence of any acute focal deficits or paresthesias. Patient noted that last 3 to 4 days he had a progressively mild productive cough with no hemoptysis and denies any fevers, chills, and felt more weak with difficulty ambulating and states that he did not fall and almost multiple times during his generalized weakness. Had no worsening peripheral edema and sees Dr. Earlie Server in outpatient setting for his lung cancer. In the ED he was brought in and had a Tmax of 99.5 with a heart rate of 111 and was given IV fluid boluses with lactated Ringer's and initiated on Levaquin. He is admitted for generalized weakness and community-acquired pneumonia and also admitted for AKI, dehydration and lab abnormalities including hypocalcemia and hypokalemia.    Respiratory status was still tenuous and PCCM was consulted and after discussion patient decided to be DNR.  Palliative care was consulted for further goals of care discussion and after further discussion patient wishes to be comfort care and wished to transition to comfort measures today but would like to continue Levaquin until he has completed his entire course but the main priority is to focus on comfort.  TOC was consulted by the palliative care team for referral to hospice at [facility].  All medications not in the patient's comfort were discontinued except the antibiotics.   Assessment and Plan:  Generalized weakness -Likely in the setting of electrolyte abnormalities and  dysfunction -Will need PT and OT to further evaluate and treat and will get a fluid hydration.  No infectious process noted -Urinalysis is not suggestive of UTI -Patient has been likely dehydrated we will continue IV for hydration and check a TSH and mag level Continue to hold gabapentin -Patient may have a community-acquired pneumonia and so is getting Levaquin   Hypocalcemia -Being replete with IV calcium gluconate Picture monitor trend and repeat CMP in the a.m. and hold Lasix for now -Electrolytes were low again so we will replete with another IV calcium gluconate   Community-acquired pneumonia -Has a mildly associated productive cough and an oxygen requirement -Blood cultures x 2 pending and has been initiated on levofloxacin From -Influenza panel negative -Repeat chest x-ray in the a.m. -Will make further adjustments to his regimen and continue Trelegy but will add Xopenex and guaifenesin 1200 malaise.  Twice daily, incentive spirometry as well as flutter valve -Repeat chest x-ray in the a.m. -Will need an amatory home O2 screen prior to discharge however.  Patient is now full comfort care.  Given that his respiratory status is not really improving pulmonary was consulted for further evaluation and made the patient DNR and made some changes   Anemia of chronic disease The patient's hemoglobin/hematocrit is now 7.6/20.2 yesterday and today is now 7.5/23.7 From anemia panel done and showed an iron level of 22, UIBC 104, TIBC 126, saturation ratios of 18%, ferritin level 1122 -Continue with ferrous sulfate 3 x 1 mg p.o. daily -Continue to monitor for signs and symptoms of bleeding; no overt bleeding noted For repeat CBC in a.m.   Elevated  CK -IV fluid hydration given but will resume normal saline at 75 mL/h and will need to monitor and repeat CK in the morning and that his CK on admission was greater than 3000 and is now 1900   Hypothyroidism Continue with levothyroxine    Hypokalemia -Replete as potassium is now 2.9   AKI -Suggestive of dehydration and elevated CK -Avoid nephrotoxic medications, contrast dyes and hold Lasix as well as renally adjust medications and avoid hypotension to ensure adequate renal perfusion -Resume IV fluid hydration at normal saline at 75 MLS per hour -Strict I's and O's -Patient's BUNs/creatinine has gone from 31/1.43 is now 22/1.12 and is now 9/1.10 -Will not continue to monitor and trend given the patient is being transitioned to comfort care   GAD -Continue home Effexor and continue Ativan   Chronic diastolic CHF -Most recent echocardiogram was performed in October 2023 which showed an LVEF of 55 to 60% with indeterminate diastolic parameters -Cautious IV fluid hydration and will need to continue monitor for signs and symptoms of volume overload: Will stop his IV fluid hydration now -Holding home Lasix for now   COPD -Continue Trelegy and added Xopenex -Also on as needed albuterol and also added guaifenesin, flutter valve and incentive spirometry   Hyperlipidemia -Hold his pravastatin given his elevated CPK   Hypomagnesemia -Patient's magnesium level was 0.6 and improved to 1.1 -Replete with IV mag sulfate 4 g again -Will not continue to monitor and trend and repeat magnesium level in the a.m. given the patient is being transitioned to comfort care  After further goals of care discussion with pulmonary and palliative care medicine patient has elected to go comfort care and wants to go to hospice at [facility].  All medications not in the patient's comfort have been discontinued except antibiotics given the patient's request.  The focus is is to shift towards comfort and he has been placed on oxycodone 10 mg p.o. every 4, Ativan 1 mg p.o. 4 times daily, IV Dilaudid as needed every 2 hours for breakthrough symptoms, IV Ativan every 2 hours for breakthrough symptoms, Robinul for as needed excessive secretions, Liquifilm  Tears for dry eyes as well as comfort feeding  DVT prophylaxis: SCDs Start: 02/03/22 0019    Code Status: DNR Family Communication: No family currently at bedside  Disposition Plan:  Level of care: Progressive Status is: Inpatient Remains inpatient appropriate because: Being transitioned to Full Comfort and needs Hospice to be arranged    Consultants:  Pulmonary Palliative care medicine  Procedures:  As delineated as above  Antimicrobials:  Anti-infectives (From admission, onward)    Start     Dose/Rate Route Frequency Ordered Stop   02/03/22 2100  levofloxacin (LEVAQUIN) IVPB 750 mg        750 mg 100 mL/hr over 90 Minutes Intravenous Every 24 hours 02/02/22 2251 02/07/22 2059   02/02/22 2115  levofloxacin (LEVAQUIN) IVPB 750 mg        750 mg 100 mL/hr over 90 Minutes Intravenous  Once 02/02/22 2104 02/02/22 2307       Subjective: Seen and examined at bedside and he is felt slightly better but continues to have some difficulty breathing.  Pulmonary was consulted and after further goals of care discussion he was made DNR.  Patient continues to feel short of breath and states he denies any chest pain.  No other concerns or complaints at this time and after goals of care discussion with palliative care medicine he is elected for  comfort measures  Objective: Vitals:   02/04/22 1025 02/04/22 1100 02/04/22 1243 02/04/22 1333  BP:  109/80 (!) 133/102   Pulse: 83 83 67   Resp: 18 (!) 32 (!) 30   Temp:   98.1 F (36.7 C)   TempSrc:   Oral   SpO2: 93% 95% 99% 97%  Weight:      Height:        Intake/Output Summary (Last 24 hours) at 02/04/2022 1506 Last data filed at 02/04/2022 0300 Gross per 24 hour  Intake 147.5 ml  Output 2050 ml  Net -1902.5 ml   Filed Weights   02/02/22 2039  Weight: 77.1 kg   Examination: Physical Exam:  Constitutional: Chronically ill-appearing Caucasian male currently no acute distress Respiratory: Diminished to auscultation bilaterally,  no wheezing, rales, rhonchi or crackles. Normal respiratory effort and patient is not tachypenic. No accessory muscle use.  Wearing supplemental oxygen nasal cannula Cardiovascular: RRR, no murmurs / rubs / gallops. S1 and S2 auscultated.  Has some extremity edema. Abdomen: Soft, non-tender, non-distended. Bowel sounds positive.  GU: Deferred. Musculoskeletal: No clubbing / cyanosis of digits/nails. No joint deformity upper and lower extremities.  Skin: No rashes, lesions, ulcers on limited skin evaluation. No induration; Warm and dry.  Neurologic: CN 2-12 grossly intact with no focal deficits. Romberg sign and cerebellar reflexes not assessed.  Psychiatric: Normal judgment and insight. Alert and oriented x 3. Normal mood and appropriate affect.   Data Reviewed: I have personally reviewed following labs and imaging studies  CBC: Recent Labs  Lab 02/02/22 2042 02/03/22 0331 02/04/22 0417  WBC 6.1 7.2 7.2  NEUTROABS 4.8  --  6.4  HGB 7.6* 7.6* 7.5*  HCT 22.8* 23.2* 23.7*  MCV 82.9 84.4 87.8  PLT 145* 163 008   Basic Metabolic Panel: Recent Labs  Lab 02/02/22 2042 02/03/22 0331 02/04/22 0417  NA 135 136 133*  K 3.4* 3.5 2.9*  CL 96* 100 96*  CO2 23 21* 25  GLUCOSE 94 84 186*  BUN 31* 22 9  CREATININE 1.43* 1.12 1.10  CALCIUM 5.1* 5.7* 5.8*  MG  --  0.6* 1.1*  PHOS  --  2.9 2.1*   GFR: Estimated Creatinine Clearance: 71.1 mL/min (by C-G formula based on SCr of 1.1 mg/dL). Liver Function Tests: Recent Labs  Lab 02/02/22 2042 02/03/22 0331 02/04/22 0417  AST 82* 72* 64*  ALT 26 24 24   ALKPHOS 78 74 77  BILITOT 0.7 0.9 0.6  PROT 6.3* 6.1* 5.7*  ALBUMIN 2.3* 2.2* 2.0*   No results for input(s): "LIPASE", "AMYLASE" in the last 168 hours. No results for input(s): "AMMONIA" in the last 168 hours. Coagulation Profile: Recent Labs  Lab 02/02/22 2042 02/03/22 0331  INR 1.6* 1.6*   Cardiac Enzymes: Recent Labs  Lab 02/03/22 0331 02/04/22 0417  CKTOTAL 3,658*  1,900*   BNP (last 3 results) No results for input(s): "PROBNP" in the last 8760 hours. HbA1C: Recent Labs    02/03/22 0331  HGBA1C 5.4   CBG: Recent Labs  Lab 02/03/22 1702 02/03/22 2234 02/04/22 0805 02/04/22 1126 02/04/22 1245  GLUCAP 106* 180* 197* 166* 160*   Lipid Profile: No results for input(s): "CHOL", "HDL", "LDLCALC", "TRIG", "CHOLHDL", "LDLDIRECT" in the last 72 hours. Thyroid Function Tests: Recent Labs    02/03/22 0331  TSH 4.812*   Anemia Panel: Recent Labs    02/03/22 0331  FERRITIN 1,122*  TIBC 126*  IRON 22*   Sepsis Labs: Recent Labs  Lab 02/02/22  2042 02/03/22 0331  PROCALCITON  --  0.76  LATICACIDVEN 1.2  --     Recent Results (from the past 240 hour(s))  Blood Culture (routine x 2)     Status: None (Preliminary result)   Collection Time: 02/02/22  8:42 PM   Specimen: BLOOD  Result Value Ref Range Status   Specimen Description BLOOD LEFT ANTECUBITAL  Final   Special Requests   Final    BOTTLES DRAWN AEROBIC AND ANAEROBIC Blood Culture adequate volume   Culture   Final    NO GROWTH 2 DAYS Performed at [facility], [place] [address], [place], [place] [address]    Report Status PENDING  Incomplete  Resp panel by RT-PCR (RSV, Flu A&B, Covid) Anterior Nasal Swab     Status: None   Collection Time: 02/02/22  9:03 PM   Specimen: Anterior Nasal Swab  Result Value Ref Range Status   SARS Coronavirus 2 by RT PCR NEGATIVE NEGATIVE Final    Comment: (NOTE) SARS-CoV-2 target nucleic acids are NOT DETECTED.  The SARS-CoV-2 RNA is generally detectable in upper respiratory specimens during the acute phase of infection. The lowest concentration of SARS-CoV-2 viral copies this assay can detect is 138 copies/mL. A negative result does not preclude SARS-Cov-2 infection and should not be used as the sole basis for treatment or other patient management decisions. A negative result may occur with  improper specimen collection/handling,  submission of specimen other than nasopharyngeal swab, presence of viral mutation(s) within the areas targeted by this assay, and inadequate number of viral copies(<138 copies/mL). A negative result must be combined with clinical observations, patient history, and epidemiological information. The expected result is Negative.  Fact Sheet for Patients:  EntrepreneurPulse.com.au  Fact Sheet for Healthcare Providers:  IncredibleEmployment.be  This test is no t yet approved or cleared by the [place] FDA and  has been authorized for detection and/or diagnosis of SARS-CoV-2 by FDA under an Emergency Use Authorization (EUA). This EUA will remain  in effect (meaning this test can be used) for the duration of the COVID-19 declaration under Section 564(b)(1) of the Act, 21 U.S.C.section 360bbb-3(b)(1), unless the authorization is terminated  or revoked sooner.       Influenza A by PCR NEGATIVE NEGATIVE Final   Influenza B by PCR NEGATIVE NEGATIVE Final    Comment: (NOTE) The Xpert Xpress SARS-CoV-2/FLU/RSV plus assay is intended as an aid in the diagnosis of influenza from Nasopharyngeal swab specimens and should not be used as a sole basis for treatment. Nasal washings and aspirates are unacceptable for Xpert Xpress SARS-CoV-2/FLU/RSV testing.  Fact Sheet for Patients: EntrepreneurPulse.com.au  Fact Sheet for Healthcare Providers: IncredibleEmployment.be  This test is not yet approved or cleared by the [place] FDA and has been authorized for detection and/or diagnosis of SARS-CoV-2 by FDA under an Emergency Use Authorization (EUA). This EUA will remain in effect (meaning this test can be used) for the duration of the COVID-19 declaration under Section 564(b)(1) of the Act, 21 U.S.C. section 360bbb-3(b)(1), unless the authorization is terminated or revoked.     Resp Syncytial Virus by PCR NEGATIVE  NEGATIVE Final    Comment: (NOTE) Fact Sheet for Patients: EntrepreneurPulse.com.au  Fact Sheet for Healthcare Providers: IncredibleEmployment.be  This test is not yet approved or cleared by the [place] FDA and has been authorized for detection and/or diagnosis of SARS-CoV-2 by FDA under an Emergency Use Authorization (EUA). This EUA will remain in effect (meaning this test can be  used) for the duration of the COVID-19 declaration under Section 564(b)(1) of the Act, 21 U.S.C. section 360bbb-3(b)(1), unless the authorization is terminated or revoked.  Performed at [facility], [place] [address] [place], [place] [address]   Urine Culture     Status: None (Preliminary result)   Collection Time: 02/03/22 12:15 AM   Specimen: In/Out Cath Urine  Result Value Ref Range Status   Specimen Description IN/OUT CATH URINE  Final   Special Requests NONE  Final   Culture   Final    CULTURE REINCUBATED FOR BETTER GROWTH Performed at [facility], [place] [address], [place], [place] [address]    Report Status PENDING  Incomplete  Blood Culture (routine x 2)     Status: None (Preliminary result)   Collection Time: 02/03/22  3:41 AM   Specimen: BLOOD RIGHT HAND  Result Value Ref Range Status   Specimen Description BLOOD RIGHT HAND  Final   Special Requests   Final    BOTTLES DRAWN AEROBIC ONLY Blood Culture results may not be optimal due to an inadequate volume of blood received in culture bottles   Culture   Final    NO GROWTH 1 DAY Performed at [facility], [place] [address], [place], [place] [address]    Report Status PENDING  Incomplete    Radiology Studies: DG CHEST PORT 1 VIEW  Result Date: 02/04/2022 CLINICAL DATA:  66 year old male with history of shortness of breath. EXAM: PORTABLE CHEST 1 VIEW COMPARISON:  Chest x-ray 02/02/2022. FINDINGS: Opacity at the right base which may reflect atelectasis and/or consolidation. Small  right pleural effusion. Left lung is clear. No left pleural effusion. No pneumothorax. Cephalization of the pulmonary vasculature, without frank pulmonary edema. Mild cardiomegaly. Upper mediastinal contours are within normal limits. IMPRESSION: 1. Atelectasis and/or consolidation in the right lung base with small right pleural effusion. 2. Cardiomegaly with pulmonary venous congestion, but no frank pulmonary edema. Electronically Signed   By: Vinnie Langton M.D.   On: 02/04/2022 06:02   DG Chest Port 1 View  Result Date: 02/02/2022 CLINICAL DATA:  Questionable sepsis EXAM: PORTABLE CHEST 1 VIEW COMPARISON:  CT chest 10/26/2021.  Chest x-ray 11/24/2020. FINDINGS: Small right pleural effusion with right lower lung airspace opacities extending to the right hilum appears stable. Cardiomediastinal silhouette is within normal limits. No evidence for pneumothorax. No acute fractures are seen. Stable nodular density in the left lower lung. IMPRESSION: 1. Stable small right pleural effusion with right lower lung airspace opacities extending to the right hilum. 2. Stable nodular density in the left lower lung. Electronically Signed   By: Ronney Asters M.D.   On: 02/02/2022 21:30    Scheduled Meds:  aspirin EC  81 mg Oral Daily   ferrous sulfate  325 mg Oral Q breakfast   fluticasone furoate-vilanterol  1 puff Inhalation Daily   And   umeclidinium bromide  1 puff Inhalation Daily   guaiFENesin  1,200 mg Oral BID   insulin aspart  0-6 Units Subcutaneous TID WC   levalbuterol  0.63 mg Nebulization TID   levothyroxine  75 mcg Oral QAC breakfast   metoprolol succinate  25 mg Oral Daily   potassium chloride  40 mEq Oral BID   QUEtiapine  100 mg Oral QHS   venlafaxine  75 mg Oral Q breakfast   Continuous Infusions:  levofloxacin (LEVAQUIN) IV Stopped (02/03/22 2159)   sodium phosphate 30 mmol in dextrose 5 % 250 mL infusion  30 mmol (02/04/22 1131)    LOS: 2 days   Raiford Noble, DO [facility] Available via Epic secure chat 7am-7pm After these hours, please refer to coverage provider listed on [email or web address] 02/04/2022, 3:06 PM

## 2022-02-04 NOTE — ED Notes (Signed)
This EMT helped to reposition the patient with a pillow. Ensured the environment was secured and call bell was within reach. Will continue to monitor the patient.

## 2022-02-04 NOTE — ED Notes (Signed)
Paged admitting provider for critical lab result. Waiting for callback.

## 2022-02-04 NOTE — Consult Note (Signed)
NAME:  [name], MRN:  [id number], DOB:  September 28, 1955, LOS: 2 ADMISSION DATE:  02/02/2022, CONSULTATION DATE: 02/04/2022 REFERRING MD: [facility], CHIEF COMPLAINT: Respiratory distress  History of Present Illness:  [name] is a 66 year old male former smoker diagnosed with non-small cell lung cancer but quit treatment 1 month ago due to discomfort weight loss and the feeling that it was not helping him.  He fell in the nursing home and was transported to [facility] and noted to have community-acquired pneumonia pulmonary critical care was asked to evaluate.  After discussion with him he said he wants to be a DO NOT RESUSCITATE.  Pulmonary critical care will be available as needed.  Pertinent  Medical History   Past Medical History:  Diagnosis Date   Anginal pain ([place])    Anxiety    Arthritis    "right hip; herniated L4-5" (11/05/2015)   Asthma    Bipolar 1 disorder (HCC)    CHF (congestive heart failure) (HCC)    Chronic bronchitis (HCC)    Chronic lower back pain    Chronic pain disorder    Community acquired pneumonia 10/30/2016   COPD (chronic obstructive pulmonary disease) ([place])    Coronary artery disease    Coronary atherosclerosis 12/05/2008   Qualifier: Diagnosis of  By: Jaramillo, [place], HX OF 12/05/2008   Qualifier: Diagnosis of  By: Jaramillo, [place], MAJOR 12/05/2008   Qualifier: Diagnosis of  By: Sidney Ace     Diabetes mellitus type 2 with complications ([place]) 04/28/6576   Diverticulitis large intestine    DVT (deep venous thrombosis) ([place]) 1991   "left calf; after 1st knee scope"   EROSIVE GASTRITIS 12/05/2008   Qualifier: Diagnosis of  By: Sidney Ace     Fungal esophagitis    GERD 12/05/2008   Qualifier: Diagnosis of  By: Sidney Ace     GERD (gastroesophageal reflux disease)    Heart attack ([place]) 2010   BMS x 2 RCA   High cholesterol    Hyperlipidemia    Hypertension    Hypothyroidism    MRSA  (methicillin resistant staph aureus) culture positive    Myocardial infarction ([place])    MYOCARDIAL INFARCTION, HX OF 12/05/2008   Qualifier: Diagnosis of  By: Sidney Ace     Obesity 12/05/2008   Qualifier: Diagnosis of  By: Sidney Ace     Pneumonia    "several times"   PULMONARY NODULE, LEFT LOWER LOBE 12/05/2008   Qualifier: Diagnosis of  By: Sidney Ace     S/P angioplasty with stent 11/05/15 PCI DES to mLAD  11/06/2015   Schizo affective schizophrenia ([place]) 2002   SOB (shortness of breath) 08/22/2016   Status post cholecystectomy 09/25/2014     Significant Hospital Events: Including procedures, antibiotic start and stop dates in addition to other pertinent events     Interim History / Subjective:  This transition to a no CODE BLUE status  Objective   Blood pressure 109/80, pulse 83, temperature 98.8 F (37.1 C), temperature source Axillary, resp. rate (!) 32, height 6' (1.829 m), weight 77.1 kg, SpO2 95 %.        Intake/Output Summary (Last 24 hours) at 02/04/2022 1209 Last data filed at 02/04/2022 0300 Gross per 24 hour  Intake 147.5 ml  Output 2050 ml  Net -1902.5 ml   Filed Weights   02/02/22 2039  Weight: 77.1 kg    Examination: General: Frail elderly male  who is in obvious respiratory distress HENT: No JVD Lungs: Coarse rhonchi bilaterally Cardiovascular: Heart sounds are distant Abdomen: Soft nontender Extremities: 1+ edema Neuro: Somewhat anxious but intact GU: Foley  Resolved Hospital Problem list     Assessment & Plan:  Acute on chronic respiratory failure in the setting of non-small cell lung cancer superimposed infection in a patient who stopped chemotherapy and radiation due to discomfort.  We had a discussion and he has chosen to be a DNR at this time.  He will continue to receive antibiotics and medical treatment with no intubation no ventilator no shock no vasopressors. Continue antimicrobial therapy currently on Levaquin Continue  bronchodilators Oxygen as needed Agree with Ativan Agree with oxycodone Can be admitted to 6 N. and remain on [facility] service. Pulmonary critical care will be available as needed  Non-small cell lung cancer He stopped treatment due to discomfort  coronary artery disease Continue Metroprolol  Palliative care Is recommended palliation palliative care consult with goal of care to be comfort than curative.  Best Practice (right click and "Reselect all SmartList Selections" daily)   Diet/type: Regular consistency (see orders) DVT prophylaxis:  GI prophylaxis: PPI Lines: N/A Foley:  N/A Code Status:  DNR Last date of multidisciplinary goals of care discussion [tbd] 02/04/2022 12 noon discussion with the patient he does not want to be intubated, placed on mechanical ventilatory support, does not want chest compressions or vasopressor support nor chest compressions.  He does want antimicrobial therapy and usual treatments for breathing but nothing aerobic.  CODE STATUS was changed to DNR at this time. Labs   CBC: Recent Labs  Lab 02/02/22 2042 02/03/22 0331 02/04/22 0417  WBC 6.1 7.2 7.2  NEUTROABS 4.8  --  6.4  HGB 7.6* 7.6* 7.5*  HCT 22.8* 23.2* 23.7*  MCV 82.9 84.4 87.8  PLT 145* 163 660    Basic Metabolic Panel: Recent Labs  Lab 02/02/22 2042 02/03/22 0331 02/04/22 0417  NA 135 136 133*  K 3.4* 3.5 2.9*  CL 96* 100 96*  CO2 23 21* 25  GLUCOSE 94 84 186*  BUN 31* 22 9  CREATININE 1.43* 1.12 1.10  CALCIUM 5.1* 5.7* 5.8*  MG  --  0.6* 1.1*  PHOS  --  2.9 2.1*   GFR: Estimated Creatinine Clearance: 71.1 mL/min (by C-G formula based on SCr of 1.1 mg/dL). Recent Labs  Lab 02/02/22 2042 02/03/22 0331 02/04/22 0417  PROCALCITON  --  0.76  --   WBC 6.1 7.2 7.2  LATICACIDVEN 1.2  --   --     Liver Function Tests: Recent Labs  Lab 02/02/22 2042 02/03/22 0331 02/04/22 0417  AST 82* 72* 64*  ALT _0 ALKPHOS 78 74 77  BILITOT 0.7 0.9 0.6  PROT 6.3*  6.1* 5.7*  ALBUMIN 2.3* 2.2* 2.0*   No results for input(s): "LIPASE", "AMYLASE" in the last 168 hours. No results for input(s): "AMMONIA" in the last 168 hours.  ABG    Component Value Date/Time   PHART 7.440 09/24/2014 0920   PCO2ART 43.1 09/24/2014 0920   PO2ART 26.0 (LL) 09/24/2014 0920   HCO3 29.2 (H) 09/24/2014 0920   TCO2 31 09/24/2014 0920   ACIDBASEDEF 2.9 (H) 01/27/2008 2320   O2SAT 50.0 09/24/2014 0920     Coagulation Profile: Recent Labs  Lab 02/02/22 2042 02/03/22 0331  INR 1.6* 1.6*    Cardiac Enzymes: Recent Labs  Lab 02/03/22 0331 02/04/22 Alexander* 1,900*  HbA1C: Hgb A1c MFr Bld  Date/Time Value Ref Range Status  02/03/2022 03:31 AM 5.4 4.8 - 5.6 % Final    Comment:    (NOTE)         Prediabetes: 5.7 - 6.4         Diabetes: >6.4         Glycemic control for adults with diabetes: <7.0   11/19/2008 08:00 AM  4.6 - 6.1 % Final   5.2 (NOTE) The ADA recommends the following therapeutic goal for glycemic control related to Hgb A1c measurement: Goal of therapy: <6.5 Hgb A1c  Reference: American Diabetes Association: Clinical Practice Recommendations 2010, Diabetes Care, 2010, 33: (Suppl  1).    CBG: Recent Labs  Lab 02/03/22 1239 02/03/22 1702 02/03/22 2234 02/04/22 0805 02/04/22 1126  GLUCAP 126* 106* 180* 197* 166*    Review of Systems:   10 point review of system taken, please see HPI for positives and negatives.   Past Medical History:  He,  has a past medical history of Anginal pain ([place]), Anxiety, Arthritis, Asthma, Bipolar 1 disorder ([place]), CHF (congestive heart failure) ([place]), Chronic bronchitis ([place]), Chronic lower back pain, Chronic pain disorder, Community acquired pneumonia (10/30/2016), COPD (chronic obstructive pulmonary disease) ([place]), Coronary artery disease, Coronary atherosclerosis (12/05/2008), DEEP VENOUS THROMBOPHLEBITIS, HX OF (12/05/2008), DEPRESSION, MAJOR (12/05/2008), Diabetes mellitus type 2 with  complications ([place]) (4/76/5465), Diverticulitis large intestine, DVT (deep venous thrombosis) ([place]) (1991), EROSIVE GASTRITIS (12/05/2008), Fungal esophagitis, GERD (12/05/2008), GERD (gastroesophageal reflux disease), Heart attack ([place]) (2010), High cholesterol, Hyperlipidemia, Hypertension, Hypothyroidism, MRSA (methicillin resistant staph aureus) culture positive, Myocardial infarction [facility]), MYOCARDIAL INFARCTION, HX OF (12/05/2008), Obesity (12/05/2008), Pneumonia, PULMONARY NODULE, LEFT LOWER LOBE (12/05/2008), S/P angioplasty with stent 11/05/15 PCI DES to mLAD  (11/06/2015), Schizo affective schizophrenia ([place]) (2002), SOB (shortness of breath) (08/22/2016), and Status post cholecystectomy (09/25/2014).   Surgical History:   Past Surgical History:  Procedure Laterality Date   APPENDECTOMY     BIOPSY  11/16/2021   Procedure: BIOPSY;  Surgeon: Rigoberto Noel, MD;  Location: [facility];  Service: Cardiopulmonary;;   BRONCHIAL BRUSHINGS  11/16/2021   Procedure: BRONCHIAL BRUSHINGS;  Surgeon: Rigoberto Noel, MD;  Location: [facility];  Service: Cardiopulmonary;;   BRONCHIAL WASHINGS  11/16/2021   Procedure: BRONCHIAL WASHINGS;  Surgeon: Rigoberto Noel, MD;  Location: [facility];  Service: Cardiopulmonary;;   CARDIAC CATHETERIZATION N/A 11/05/2015   Procedure: Left Heart Cath and Coronary Angiography;  Surgeon: Nelva Bush, MD;  Location: [place] [facility];  Service: Cardiovascular;  Laterality: N/A;   CARDIAC CATHETERIZATION N/A 11/05/2015   Procedure: Coronary Stent Intervention;  Surgeon: Nelva Bush, MD;  Location: [place] [facility];  Service: Cardiovascular;  Laterality: N/A;   CARDIAC CATHETERIZATION N/A 11/05/2015   Procedure: Intravascular Pressure Wire/FFR Study;  Surgeon: Nelva Bush, MD;  Location: [place] [facility];  Service: Cardiovascular;  Laterality: N/A;   CORONARY ANGIOPLASTY WITH STENT PLACEMENT  2010   90% RCA s/p BMS x 2, 50% LAD, 30% CFX, EF 45-50%   CORONARY  ANGIOPLASTY WITH STENT PLACEMENT  11/05/2015   "1 today; makes a total of 4 stents" (11/05/2015)   ENDOBRONCHIAL ULTRASOUND Bilateral 11/16/2021   Procedure: ENDOBRONCHIAL ULTRASOUND;  Surgeon: Rigoberto Noel, MD;  Location: [facility];  Service: Cardiopulmonary;  Laterality: Bilateral;   FINE NEEDLE ASPIRATION BIOPSY  11/16/2021   Procedure: FINE NEEDLE ASPIRATION BIOPSY;  Surgeon: Rigoberto Noel, MD;  Location: [facility];  Service: Cardiopulmonary;;   HEMOSTASIS CONTROL  11/16/2021  Procedure: HEMOSTASIS CONTROL;  Surgeon: Rigoberto Noel, MD;  Location: [facility];  Service: Cardiopulmonary;;   INGUINAL HERNIA REPAIR Right    KNEE ARTHROSCOPY Left X 3   LAPAROSCOPIC CHOLECYSTECTOMY     TONSILLECTOMY AND ADENOIDECTOMY     UPPER GI ENDOSCOPY  05/24/2016   UGI ENDO INCLUDE ESOPHAGUS, STOMACH & DUODENUM &/OR JEJUNUM; DX W/WO COLLECTION SPECIMEN BY BRUSH OR [place]; SURGEON :ALBERT San Morelle, MD LOCATION; [facility]     Social History:   reports that he has been smoking cigarettes. He has a 42.00 pack-year smoking history. He has never used smokeless tobacco. He reports that he does not drink alcohol and does not use drugs.   Family History:  His family history includes Heart Problems in his mother; Heart disease in his father.   Allergies Allergies  Allergen Reactions   Rocephin [Ceftriaxone] Anaphylaxis   Zofran [Ondansetron Hcl] Swelling    sts had tongue swelling and trouble breathing   Ampicillin Other (See Comments)    Per MAR    Lipitor [Atorvastatin] Other (See Comments)    Per MAR    Prochlorperazine Other (See Comments)    Per MAR    Toradol [Ketorolac Tromethamine] Nausea And Vomiting    Pt states he is not allergic to ibuprofen   Tramadol Nausea And Vomiting    Not listed on MAR      Home Medications  Prior to Admission medications   Medication Sig Start Date End Date Taking? Authorizing Provider  acetaminophen (TYLENOL) 325 MG tablet Take 650 mg by  mouth every 6 (six) hours as needed (pain.).   Yes [provider]  albuterol (PROVENTIL) (2.5 MG/3ML) 0.083% nebulizer solution Take 2.5 mg by nebulization every 6 (six) hours as needed for wheezing or shortness of breath.   Yes [provider]  albuterol (VENTOLIN HFA) 108 (90 Base) MCG/ACT inhaler Inhale 2 puffs into the lungs every 6 (six) hours as needed for wheezing or shortness of breath.   Yes [provider]  allopurinol (ZYLOPRIM) 100 MG tablet Take 100 mg by mouth daily. 11/27/20  Yes [provider]  aspirin EC 81 MG tablet Take 1 tablet (81 mg total) by mouth daily. Swallow whole. 11/03/21  Yes O'Neal, Cassie Freer, MD  cyclobenzaprine (FLEXERIL) 10 MG tablet Take 10 mg by mouth 3 (three) times daily. (0900, 1300 & 2100)   Yes [provider]  ferrous sulfate 325 (65 FE) MG tablet Take 325 mg by mouth in the morning and at bedtime.   Yes [provider]  Fluticasone-Umeclidin-Vilant (TRELEGY ELLIPTA) 200-62.5-25 MCG/ACT AEPB Inhale 1 puff into the lungs daily. 10/26/21  Yes Cobb, Karie Schwalbe, NP  gabapentin (NEURONTIN) 400 MG capsule Take 400 mg by mouth 3 (three) times daily.   Yes [provider]  Glucagon, rDNA, (GLUCAGON EMERGENCY) 1 MG KIT Inject 1 mg as directed as needed (hypoglycemia).   Yes [provider]  guaiFENesin (MUCINEX) 600 MG 12 hr tablet Take 600 mg by mouth every 12 (twelve) hours. For 5 days starting 02/01/22   Yes [provider]  HUMALOG KWIKPEN 100 UNIT/ML KwikPen Inject 0-10 Units into the skin in the morning, at noon, in the evening, and at bedtime. Per Sliding Scale: 0-150=0 units 151-200=2 units 201-250=4 units 251-300=6 units  301-350= 8 units  351-400=10 units 03/04/21  Yes [provider]  ipratropium-albuterol (DUONEB) 0.5-2.5 (3) MG/3ML SOLN Inhale 3 mLs into the lungs every 6 (six) hours as needed (wheezing and SOB).  Yes [provider]  levofloxacin  (LEVAQUIN) 500 MG tablet Take 500 mg by mouth daily. 02/02/22 02/08/22 Yes [provider]  levothyroxine (SYNTHROID) 75 MCG tablet Take 75 mcg by mouth daily before breakfast.   Yes [provider]  LORazepam (ATIVAN) 2 MG tablet Take 2 mg by mouth every 12 (twelve) hours.   Yes [provider]  metFORMIN (GLUCOPHAGE) 500 MG tablet Take 500 mg by mouth in the morning and at bedtime.   Yes [provider]  metoprolol succinate (TOPROL-XL) 25 MG 24 hr tablet Take 25 mg by mouth in the morning. (0900) 04/02/18  Yes [provider]  omeprazole (PRILOSEC) 20 MG capsule Take 20 mg by mouth in the morning.   Yes [provider]  Oxycodone HCl 10 MG TABS Take 10 mg by mouth every 4 (four) hours as needed (pain).   Yes [provider]  OXYGEN Inhale 2-4 L into the lungs as needed (SOB or O2 stat below 90%).   Yes [provider]  polyethylene glycol (MIRALAX / GLYCOLAX) packet Take 17 g by mouth daily as needed for mild constipation.    Yes [provider]  pravastatin (PRAVACHOL) 80 MG tablet Take 80 mg by mouth at bedtime.   Yes [provider]  QUEtiapine (SEROQUEL) 50 MG tablet Take 75 mg by mouth at bedtime.   Yes [provider]  temazepam (RESTORIL) 30 MG capsule Take 30 mg by mouth at bedtime. (2000)   Yes [provider]  therapeutic multivitamin-minerals (THERAGRAN-M) tablet Take 1 tablet by mouth in the morning.   Yes [provider]  venlafaxine (EFFEXOR) 75 MG tablet Take 75 mg by mouth in the morning. (0900)   Yes [provider]  calcium-vitamin D (OSCAL WITH D) 500-5 MG-MCG tablet Take 1 tablet by mouth 2 (two) times daily. Patient not taking: Reported on 02/04/2022 12/20/21   Curt Bears, MD  furosemide (LASIX) 20 MG tablet Take 20 mg by mouth in the morning. (0900) Patient not taking: Reported on 02/04/2022 11/27/20   [provider]  prochlorperazine  (COMPAZINE) 10 MG tablet Take 1 tablet (10 mg total) by mouth every 6 (six) hours as needed for nausea or vomiting. Patient not taking: Reported on 02/04/2022 11/29/21   Heilingoetter, Cassandra L, PA-C  saccharomyces boulardii (FLORASTOR) 250 MG capsule Take 250 mg by mouth 2 (two) times daily. (0900 & 2100) Patient not taking: Reported on 02/04/2022    [provider]     Critical care time: Ferol Luz Desani Sprung ACNP Acute Care Nurse Practitioner [place] Please consult Pablo Pena 02/04/2022, 12:10 PM

## 2022-02-05 DIAGNOSIS — N179 Acute kidney failure, unspecified: Secondary | ICD-10-CM | POA: Diagnosis not present

## 2022-02-05 DIAGNOSIS — D638 Anemia in other chronic diseases classified elsewhere: Secondary | ICD-10-CM | POA: Diagnosis not present

## 2022-02-05 DIAGNOSIS — R531 Weakness: Secondary | ICD-10-CM | POA: Diagnosis not present

## 2022-02-05 DIAGNOSIS — E039 Hypothyroidism, unspecified: Secondary | ICD-10-CM | POA: Diagnosis not present

## 2022-02-05 LAB — URINE CULTURE: Culture: 4000 — AB

## 2022-02-05 MED ORDER — HYDROMORPHONE HCL 2 MG PO TABS
4.0000 mg | ORAL_TABLET | ORAL | Status: DC
  Administered 2022-02-05 – 2022-02-06 (×5): 4 mg via ORAL
  Filled 2022-02-05 (×5): qty 2

## 2022-02-05 MED ORDER — LEVOFLOXACIN 750 MG PO TABS
750.0000 mg | ORAL_TABLET | ORAL | Status: DC
  Administered 2022-02-05: 750 mg via ORAL
  Filled 2022-02-05 (×2): qty 1

## 2022-02-05 MED ORDER — HYDROMORPHONE HCL 1 MG/ML IJ SOLN
1.0000 mg | INTRAMUSCULAR | Status: DC | PRN
  Administered 2022-02-05 – 2022-02-06 (×4): 1 mg via INTRAVENOUS
  Filled 2022-02-05 (×4): qty 1

## 2022-02-05 MED ORDER — ENSURE ENLIVE PO LIQD: 237.0000 mL | Freq: Two times a day (BID) | ORAL | Status: DC

## 2022-02-05 NOTE — TOC Progression Note (Signed)
Transition of Care [facility]) - Progression Note    Patient Details  Name: [name] MRN: [id number] Date of Birth: Nov 05, 1955  [facility]) CM/SW Contact  Bartholomew Crews, RN Phone Number: [phone] 02/05/2022, 4:47 PM  Clinical Narrative:     Notified by MD of patient's readiness to transition back to [facility] where he resides long term. CSW, Ebony Hail, contacted [facility] who can accept patient back tomorrow. Will need DC summary from MD by 10 am. Patient is currently on [place] 2L oxygen. Reached out to Ivor at [facility] - plan for patient to admit to hospice services tomorrow after returning to [facility]. Patient will need [facility] transport. TOC following for transition needs.   Expected Discharge Plan: Long Term Nursing Home Barriers to Discharge: Continued Medical Work up  Expected Discharge Plan and Services Expected Discharge Plan: [place]   Discharge Planning Services: CM Consult Post Acute Care Choice: Hospice Living arrangements for the past 2 months: [place]                 DME Arranged: N/A DME Agency: NA       HH Arranged: NA HH Agency: NA         Social Determinants of Health (SDOH) Interventions    Readmission Risk Interventions     No data to display

## 2022-02-05 NOTE — Plan of Care (Signed)
  Problem: Coping: Goal: Ability to adjust to condition or change in health will improve Outcome: Progressing   Problem: Pain Managment: Goal: General experience of comfort will improve Outcome: Progressing

## 2022-02-05 NOTE — Progress Notes (Signed)
Pharmacy Antibiotic Note  [name] is a 66 y.o. male BIB EMS from nursing home and admitted on 02/02/2022 with sepsis secondary to pneumonia in setting of non-productive cough w/ weakness x1 week. 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 Pharmacy has been consulted for Levofloxacin dosing.  Plan: Patient has decided to go comfort care, but would like to continue levaquin for remainder of treatment (end date is 12/18). All comfort care medications placed and will be transitioned to [employer]. Patient renal function has continued to improve and will keep dose as is.   Height: 6' (182.9 cm) Weight: 77.1 kg (170 lb) IBW/kg (Calculated) : 77.6  Temp (24hrs), Avg:98.4 F (36.9 C), Min:97.9 F (36.6 C), Max:99 F (37.2 C)  Recent Labs  Lab 02/02/22 2042 02/03/22 0331 02/04/22 0417  WBC 6.1 7.2 7.2  CREATININE 1.43* 1.12 1.10  LATICACIDVEN 1.2  --   --      Estimated Creatinine Clearance: 71.1 mL/min (by C-G formula based on SCr of 1.1 mg/dL).    Allergies  Allergen Reactions   Rocephin [Ceftriaxone] Anaphylaxis   Zofran [Ondansetron Hcl] Swelling    sts had tongue swelling and trouble breathing   Ampicillin Other (See Comments)    Per MAR    Lipitor [Atorvastatin] Other (See Comments)    Per MAR    Prochlorperazine Other (See Comments)    Per MAR    Toradol [Ketorolac Tromethamine] Nausea And Vomiting    Pt states he is not allergic to ibuprofen   Tramadol Nausea And Vomiting    Not listed on MAR     Antimicrobials this admission: Levofloxacin 12/13 >> (12/17)  Dose adjustments this admission: N/A  Microbiology results: 12/13 BCx: NGTD 12/13 UCx: re-incubated  Thank you for allowing pharmacy to be a part of this patient's care.  Sandford Craze, PharmD. [facility] Acute [facility] PGY-1  02/05/2022 8:32  AM    Please refer to [facility] for [facility] phone number

## 2022-02-05 NOTE — Progress Notes (Signed)
PROGRESS NOTE    [name]  WLS:[id number] DOB: Dec 05, 1955 DOA: 02/02/2022 PCP: [name], MD   Brief Narrative:  Patient is a 66 year old Caucasian male with past medical history significant for non-small cell lung cancer, chronic diastolic CHF, COPD, diabetes mellitus type 2, history of anemia of chronic disease with a baseline hemoglobin 7-9, acquired hypothyroidism as well as other comorbidities who presented to the ED with generalized weakness of 3 to 4 days in the absence of any acute focal deficits or paresthesias. Patient noted that last 3 to 4 days he had a progressively mild productive cough with no hemoptysis and denies any fevers, chills, and felt more weak with difficulty ambulating and states that he did not fall and almost multiple times during his generalized weakness. Had no worsening peripheral edema and sees Dr. Earlie Server in outpatient setting for his lung cancer. In the ED he was brought in and had a Tmax of 99.5 with a heart rate of 111 and was given IV fluid boluses with lactated Ringer's and initiated on Levaquin. He is admitted for generalized weakness and community-acquired pneumonia and also admitted for AKI, dehydration and lab abnormalities including hypocalcemia and hypokalemia.     Respiratory status was still tenuous and PCCM was consulted and after discussion patient decided to be DNR.  Palliative care was consulted for further goals of care discussion and after further discussion patient wishes to be comfort care and wished to transition to comfort measures today but would like to continue Levaquin until he has completed his entire course but the main priority is to focus on comfort.  TOC was consulted by the palliative care team for referral to hosp[facility] at [facility].  All medications not in the patient's comfort were discontinued except the antibiotics.    Assessment and Plan:  Generalized weakness -Likely in the setting of electrolyte abnormalities and  dysfunction -Will need PT and OT to further evaluate and treat and will get a fluid hydration.  No infectious process noted -Urinalysis is not suggestive of UTI -Patient has been likely dehydrated we will continue IV for hydration and check a TSH and mag level Continue to hold gabapentin -Patient may have a community-acquired pneumonia and so is getting Levaquin   Hypocalcemia -Being replete with IV calcium gluconate Picture monitor trend and repeat CMP in the a.m. and hold Lasix for now -Electrolytes were low again so we will replete with another IV calcium gluconate   Community-acquired pneumonia -Has a mildly associated productive cough and an oxygen requirement -Blood cultures x 2 pending and has been initiated on levofloxacin From -Influenza panel negative -Repeat chest x-ray in the a.m. -Will make further adjustments to his regimen and continue Trelegy but will add Xopenex and guaifenesin 1200 malaise.  Twice daily, incentive spirometry as well as flutter valve -Repeat chest x-ray in the a.m. -Will need an amatory home O2 screen prior to discharge however.  Patient is now full comfort care.  Given that his respiratory status is not really improving pulmonary was consulted for further evaluation and made the patient DNR and made some changes   Anemia of chronic disease The patient's hemoglobin/hematocrit is now 7.6/20.2 yesterday and today is now 7.5/23.7 From anemia panel done and showed an iron level of 22, UIBC 104, TIBC 126, saturation ratios of 18%, ferritin level 1122 -Continue with ferrous sulfate 3 x 1 mg p.o. daily -Continue to monitor for signs and symptoms of bleeding; no overt bleeding noted For repeat CBC in a.m.  Elevated CK -IV fluid hydration given but will resume normal saline at 75 mL/h and will need to monitor and repeat CK in the morning and that his CK on admission was greater than 3000 and is now 1900   Hypothyroidism Continue with levothyroxine    Hypokalemia -Replete as potassium is now 2.9   AKI -Suggestive of dehydration and elevated CK -Avoid nephrotoxic medications, contrast dyes and hold Lasix as well as renally adjust medications and avoid hypotension to ensure adequate renal perfusion -Resume IV fluid hydration at normal saline at 75 MLS per hour -Strict I's and O's -Patient's BUNs/creatinine has gone from 31/1.43 is now 22/1.12 and is now 9/1.10 -Will not continue to monitor and trend given the patient is being transitioned to comfort care   GAD -Continue home Effexor and continue Ativan   Chronic diastolic CHF -Most recent echocardiogram was performed in October 2023 which showed an LVEF of 55 to 60% with indeterminate diastolic parameters -Cautious IV fluid hydration and will need to continue monitor for signs and symptoms of volume overload: Will stop his IV fluid hydration now -Holding home Lasix for now   COPD -Continue Trelegy and added Xopenex -Also on as needed albuterol and also added guaifenesin, flutter valve and incentive spirometry   Hyperlipidemia -Held his pravastatin given his elevated CPK but now discontinued as patient is being transitioned to Hospice    Hypomagnesemia -Patient's magnesium level was 0.6 and improved to 1.1 on last check -Replete with IV mag sulfate 4 g again yesterday prior to him becoming Comfort Care  -Will not continue to monitor and trend and repeat magnesium level in the a.m. given the patient is being transitioned to comfort care   After further goals of care discussion with pulmonary and palliative care medicine patient has elected to go comfort care and wants to go to hospice at [facility].  All medications not in the patient's comfort have been discontinued except antibiotics given the patient's request.  The focus is is to shift towards comfort and he has been placed on oxycodone 10 mg p.o. every 4, Ativan 1 mg p.o. 4 times daily, IV Dilaudid as needed every 2 hours for  breakthrough symptoms, IV Ativan every 2 hours for breakthrough symptoms, Robinul for as needed excessive secretions, Liquifilm Tears for dry eyes as well as comfort feeding   DVT prophylaxis: None given that patient is Comfort Care    Code Status: DNR Family Communication: No family present at bedside   Disposition Plan:  Level of care: Progressive Status is: Inpatient Remains inpatient appropriate because: Will go [facility] with Hospice but cannot be arranged until 02/06/22   Consultants:  PCCM Palliative Care  Procedures:  As delineated as above  Antimicrobials:  Anti-infectives (From admission, onward)    Start     Dose/Rate Route Frequency Ordered Stop   02/05/22 2100  levofloxacin (LEVAQUIN) tablet 750 mg        750 mg Oral Every 24 hours 02/05/22 1101 02/07/22 2059   02/03/22 2100  levofloxacin (LEVAQUIN) IVPB 750 mg  Status:  Discontinued        750 mg 100 mL/hr over 90 Minutes Intravenous Every 24 hours 02/02/22 2251 02/05/22 1101   02/02/22 2115  levofloxacin (LEVAQUIN) IVPB 750 mg        750 mg 100 mL/hr over 90 Minutes Intravenous  Once 02/02/22 2104 02/02/22 2307       Subjective: Seen and examined at bedside and he is still feeling rhonchorous  and states that he is having a little bit difficult time coughing up some sputum.  No nausea or vomiting.  Denies any chest pain but feels okay.  Still short of breath.  No other concerns or complaints at this time appears calm.  Objective: Vitals:   02/04/22 2108 02/05/22 0757 02/05/22 0843 02/05/22 0844  BP: 130/80 (!) 110/99    Pulse: (!) 106 (!) 108    Resp: 20 (!) 22    Temp: 99 F (37.2 C) 97.9 F (36.6 C)    TempSrc: Oral Oral    SpO2: 96% 92% 95% 95%  Weight:      Height:        Intake/Output Summary (Last 24 hours) at 02/05/2022 1633 Last data filed at 02/05/2022 0400 Gross per 24 hour  Intake 150 ml  Output --  Net 150 ml   Filed Weights   02/02/22 2039  Weight: 77.1 kg    Examination: Physical Exam:  Constitutional: WN/WD chronically ill-appearing Caucasian male currently in no acute distress Respiratory: Diminished to auscultation bilaterally with coarse breath sounds and has some significant rhonchi and some slight wheezing.  No appreciable rales or crackles.  Wearing supplemental oxygen via nasal cannula and has a slightly increased respiratory rate but no accessory muscle usage Cardiovascular: RRR, no murmurs / rubs / gallops. S1 and S2 auscultated.  Has some mild lower extremity edema Abdomen: Soft, non-tender, non-distended. Bowel sounds positive.  GU: Deferred. Musculoskeletal: No clubbing / cyanosis of digits/nails. No joint deformity upper and lower extremities.  Skin: No rashes, lesions, ulcers on limited skin evaluation. No induration; Warm and dry.  Neurologic: CN 2-12 grossly intact with no focal deficits.  Romberg sign and cerebellar reflexes not assessed.  Psychiatric: Normal judgment and insight. Alert and oriented x 3. Normal mood and appropriate affect.   Data Reviewed: I have personally reviewed following labs and imaging studies  CBC: Recent Labs  Lab 02/02/22 2042 02/03/22 0331 02/04/22 0417  WBC 6.1 7.2 7.2  NEUTROABS 4.8  --  6.4  HGB 7.6* 7.6* 7.5*  HCT 22.8* 23.2* 23.7*  MCV 82.9 84.4 87.8  PLT 145* 163 856   Basic Metabolic Panel: Recent Labs  Lab 02/02/22 2042 02/03/22 0331 02/04/22 0417  NA 135 136 133*  K 3.4* 3.5 2.9*  CL 96* 100 96*  CO2 23 21* 25  GLUCOSE 94 84 186*  BUN 31* 22 9  CREATININE 1.43* 1.12 1.10  CALCIUM 5.1* 5.7* 5.8*  MG  --  0.6* 1.1*  PHOS  --  2.9 2.1*   GFR: Estimated Creatinine Clearance: 71.1 mL/min (by C-G formula based on SCr of 1.1 mg/dL). Liver Function Tests: Recent Labs  Lab 02/02/22 2042 02/03/22 0331 02/04/22 0417  AST 82* 72* 64*  ALT 26 24 24   ALKPHOS 78 74 77  BILITOT 0.7 0.9 0.6  PROT 6.3* 6.1* 5.7*  ALBUMIN 2.3* 2.2* 2.0*   No results for input(s):  "LIPASE", "AMYLASE" in the last 168 hours. No results for input(s): "AMMONIA" in the last 168 hours. Coagulation Profile: Recent Labs  Lab 02/02/22 2042 02/03/22 0331  INR 1.6* 1.6*   Cardiac Enzymes: Recent Labs  Lab 02/03/22 0331 02/04/22 0417  CKTOTAL 3,658* 1,900*   BNP (last 3 results) No results for input(s): "PROBNP" in the last 8760 hours. HbA1C: Recent Labs    02/03/22 0331  HGBA1C 5.4   CBG: Recent Labs  Lab 02/03/22 1702 02/03/22 2234 02/04/22 0805 02/04/22 1126 02/04/22 1245  GLUCAP 106* 180*  197* 166* 160*   Lipid Profile: No results for input(s): "CHOL", "HDL", "LDLCALC", "TRIG", "CHOLHDL", "LDLDIRECT" in the last 72 hours. Thyroid Function Tests: Recent Labs    02/03/22 0331  TSH 4.812*   Anemia Panel: Recent Labs    02/03/22 0331  FERRITIN 1,122*  TIBC 126*  IRON 22*   Sepsis Labs: Recent Labs  Lab 02/02/22 2042 02/03/22 0331  PROCALCITON  --  0.76  LATICACIDVEN 1.2  --     Recent Results (from the past 240 hour(s))  Blood Culture (routine x 2)     Status: None (Preliminary result)   Collection Time: 02/02/22  8:42 PM   Specimen: BLOOD  Result Value Ref Range Status   Specimen Description BLOOD LEFT ANTECUBITAL  Final   Special Requests   Final    BOTTLES DRAWN AEROBIC AND ANAEROBIC Blood Culture adequate volume   Culture   Final    NO GROWTH 3 DAYS Performed at [facility], [place] [address], [place], [place] [address]    Report Status PENDING  Incomplete  Resp panel by RT-PCR (RSV, Flu A&B, Covid) Anterior Nasal Swab     Status: None   Collection Time: 02/02/22  9:03 PM   Specimen: Anterior Nasal Swab  Result Value Ref Range Status   SARS Coronavirus 2 by RT PCR NEGATIVE NEGATIVE Final    Comment: (NOTE) SARS-CoV-2 target nucleic acids are NOT DETECTED.  The SARS-CoV-2 RNA is generally detectable in upper respiratory specimens during the acute phase of infection. The lowest concentration of SARS-CoV-2 viral copies  this assay can detect is 138 copies/mL. A negative result does not preclude SARS-Cov-2 infection and should not be used as the sole basis for treatment or other patient management decisions. A negative result may occur with  improper specimen collection/handling, submission of specimen other than nasopharyngeal swab, presence of viral mutation(s) within the areas targeted by this assay, and inadequate number of viral copies(<138 copies/mL). A negative result must be combined with clinical observations, patient history, and epidemiological information. The expected result is Negative.  Fact Sheet for Patients:  EntrepreneurPulse.com.au  Fact Sheet for Healthcare Providers:  IncredibleEmployment.be  This test is no t yet approved or cleared by the [place] FDA and  has been authorized for detection and/or diagnosis of SARS-CoV-2 by FDA under an Emergency Use Authorization (EUA). This EUA will remain  in effect (meaning this test can be used) for the duration of the COVID-19 declaration under Section 564(b)(1) of the Act, 21 U.S.C.section 360bbb-3(b)(1), unless the authorization is terminated  or revoked sooner.       Influenza A by PCR NEGATIVE NEGATIVE Final   Influenza B by PCR NEGATIVE NEGATIVE Final    Comment: (NOTE) The Xpert Xpress SARS-CoV-2/FLU/RSV plus assay is intended as an aid in the diagnosis of influenza from Nasopharyngeal swab specimens and should not be used as a sole basis for treatment. Nasal washings and aspirates are unacceptable for Xpert Xpress SARS-CoV-2/FLU/RSV testing.  Fact Sheet for Patients: EntrepreneurPulse.com.au  Fact Sheet for Healthcare Providers: IncredibleEmployment.be  This test is not yet approved or cleared by the [place] FDA and has been authorized for detection and/or diagnosis of SARS-CoV-2 by FDA under an Emergency Use Authorization (EUA). This EUA  will remain in effect (meaning this test can be used) for the duration of the COVID-19 declaration under Section 564(b)(1) of the Act, 21 U.S.C. section 360bbb-3(b)(1), unless the authorization is terminated or revoked.     Resp Syncytial Virus by  PCR NEGATIVE NEGATIVE Final    Comment: (NOTE) Fact Sheet for Patients: EntrepreneurPulse.com.au  Fact Sheet for Healthcare Providers: IncredibleEmployment.be  This test is not yet approved or cleared by the [place] FDA and has been authorized for detection and/or diagnosis of SARS-CoV-2 by FDA under an Emergency Use Authorization (EUA). This EUA will remain in effect (meaning this test can be used) for the duration of the COVID-19 declaration under Section 564(b)(1) of the Act, 21 U.S.C. section 360bbb-3(b)(1), unless the authorization is terminated or revoked.  Performed at [facility], [place] [address], [place], [place] [address]   Urine Culture     Status: Abnormal   Collection Time: 02/03/22 12:15 AM   Specimen: In/Out Cath Urine  Result Value Ref Range Status   Specimen Description IN/OUT CATH URINE  Final   Special Requests NONE  Final   Culture (A)  Final    4,000 COLONIES/mL LEUCONOSTOC MESENTEROIDES Standardized susceptibility testing for this organism is not available. Performed at [facility], [place] [address], [place], [place] [address]    Report Status 02/05/2022 FINAL  Final  Blood Culture (routine x 2)     Status: None (Preliminary result)   Collection Time: 02/03/22  3:41 AM   Specimen: BLOOD RIGHT HAND  Result Value Ref Range Status   Specimen Description BLOOD RIGHT HAND  Final   Special Requests   Final    BOTTLES DRAWN AEROBIC ONLY Blood Culture results may not be optimal due to an inadequate volume of blood received in culture bottles   Culture   Final    NO GROWTH 2 DAYS Performed at [facility], [place] [address] [place], [place] [address]     Report Status PENDING  Incomplete    Radiology Studies: DG CHEST PORT 1 VIEW  Result Date: 02/04/2022 CLINICAL DATA:  66 year old male with history of shortness of breath. EXAM: PORTABLE CHEST 1 VIEW COMPARISON:  Chest x-ray 02/02/2022. FINDINGS: Opacity at the right base which may reflect atelectasis and/or consolidation. Small right pleural effusion. Left lung is clear. No left pleural effusion. No pneumothorax. Cephalization of the pulmonary vasculature, without frank pulmonary edema. Mild cardiomegaly. Upper mediastinal contours are within normal limits. IMPRESSION: 1. Atelectasis and/or consolidation in the right lung base with small right pleural effusion. 2. Cardiomegaly with pulmonary venous congestion, but no frank pulmonary edema. Electronically Signed   By: Vinnie Langton M.D.   On: 02/04/2022 06:02    Scheduled Meds:  [START ON 02/06/2022] feeding supplement  237 mL Oral BID BM   guaiFENesin  1,200 mg Oral BID   HYDROmorphone  4 mg Oral Q4H   levofloxacin  750 mg Oral Q24H   LORazepam  1 mg Oral QID   QUEtiapine  100 mg Oral QHS   venlafaxine  75 mg Oral Q breakfast   Continuous Infusions:   LOS: 3 days   Raiford Noble, DO [facility] Available via Epic secure chat 7am-7pm After these hours, please refer to coverage provider listed on [email or web address] 02/05/2022, 4:33 PM

## 2022-02-05 NOTE — TOC Initial Note (Signed)
Transition of Care [facility]) - Initial/Assessment Note    Patient Details  Name: [name] MRN: [id number] Date of Birth: May 28, 1955  Transition of [facility]) CM/SW Contact:    Bartholomew Crews, RN Phone Number: [phone] 02/05/2022, 12:36 PM  Clinical Narrative:                  Acknowledging [facility] consult for hospice care at SNF. Spoke with patient at the bedside to confirm his desire for hospice care. Discussed hospice agencies - patient agreeable to [facility] hospice which has a contract with [employer]. Patient will need ambulance transport at discharge. TOC following for transition needs.   Expected Discharge Plan: Long Term Nursing Home Barriers to Discharge: Continued Medical Work up   Patient Goals and CMS Choice   CMS Medicare.gov Compare Post Acute Care list provided to:: Patient Choice offered to / list presented to : Patient  Expected Discharge Plan and Services Expected Discharge Plan: [place]   Discharge Planning Services: CM Consult Post Acute Care Choice: Hospice Living arrangements for the past 2 months: [place]                 DME Arranged: N/A DME Agency: NA       HH Arranged: NA [place] Agency: NA        Prior Living Arrangements/Services Living arrangements for the past 2 months: [place] Lives with:: Facility Resident Patient language and need for interpreter reviewed:: Yes Do you feel safe going back to the place where you live?: Yes            Criminal Activity/Legal Involvement Pertinent to Current Situation/Hospitalization: No - Comment as needed  Activities of Daily Living Home Assistive Devices/Equipment: Other (Comment) (Pt is from SNF) ADL Screening (condition at time of admission) Patient's cognitive ability adequate to safely complete daily activities?: Yes Is the patient deaf or have difficulty hearing?: No Does the patient have difficulty seeing, even when wearing glasses/contacts?:  No Does the patient have difficulty concentrating, remembering, or making decisions?: No Patient able to express need for assistance with ADLs?: Yes Does the patient have difficulty dressing or bathing?: Yes Independently performs ADLs?: No Communication: Independent Dressing (OT): Independent Grooming: Independent Feeding: Independent Bathing: Independent In/Out Bed: Needs assistance Is this a change from baseline?: Change from baseline, expected to last <3 days Does the patient have difficulty walking or climbing stairs?: Yes Weakness of Legs: Both Weakness of Arms/Hands: Both  Permission Sought/Granted                  Emotional Assessment Appearance:: Appears stated age Attitude/Demeanor/Rapport: Engaged Affect (typically observed): Accepting Orientation: : Oriented to Self, Oriented to Place, Oriented to  Time, Oriented to Situation Alcohol / Substance Use: Not Applicable Psych Involvement: No (comment)  Admission diagnosis:  Hypocalcemia [E83.51] Generalized weakness [R53.1] Pneumonia of right lower lobe due to infectious organism [J18.9] Patient Active Problem List   Diagnosis Date Noted   Hypocalcemia 02/03/2022   CAP (community acquired pneumonia) 02/03/2022   Hypokalemia 02/03/2022   AKI (acute kidney injury) ([place]) 02/03/2022   Dehydration 02/03/2022   Chronic diastolic CHF (congestive heart failure) ([place]) 02/03/2022   History of COPD 02/03/2022   GAD (generalized anxiety disorder) 02/03/2022   Generalized weakness 02/02/2022   Encounter for antineoplastic chemotherapy 12/07/2021   Anemia of chronic disease 12/07/2021   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 ([place]) 11/22/2021   Malignant neoplasm of overlapping sites  of right lung ([place]) 11/18/2021   Urinary incontinence 10/26/2021   Adjustment disorder with mixed anxiety and depressed mood 06/19/2021   Lung mass 04/07/2021   Hemoptysis 04/07/2021   HCAP (healthcare-associated  pneumonia) 01/29/2017   Fall 10/30/2016   Community acquired pneumonia 10/30/2016   Diabetic polyneuropathy ([place]) 10/30/2016   HLD (hyperlipidemia) 10/30/2016   SOB (shortness of breath) 08/22/2016   Hip pain, acute, right 07/06/2016   Arthritis 07/06/2016   Chronic lower back pain 07/06/2016   Acquired hypothyroidism 07/06/2016   DM2 (diabetes mellitus, type 2) ([place]) 07/06/2016   Chronic pain disorder    Diverticulitis large intestine    Hyperlipidemia    Myocardial infarction ([place])    S/P angioplasty with stent 11/05/15 PCI DES to mLAD  11/06/2015   Ischemic chest pain ([place])    Status post cholecystectomy 09/25/2014   Chest pain 09/24/2014   COPD exacerbation ([place]) 09/24/2014   Chest pain of uncertain etiology    Hyperlipidemia LDL goal <70 12/05/2008   Obesity 12/05/2008   DEPRESSION, MAJOR 12/05/2008   Bipolar 1 disorder ([place]) 12/05/2008   TOBACCO ABUSE 12/05/2008   Hypertensive heart disease 12/05/2008   MYOCARDIAL INFARCTION, HX OF 12/05/2008   Coronary artery disease 12/05/2008   COPD (chronic obstructive pulmonary disease) ([place]) 12/05/2008   PULMONARY NODULE, LEFT LOWER LOBE 12/05/2008   GERD 12/05/2008   EROSIVE GASTRITIS 12/05/2008   DEEP VENOUS THROMBOPHLEBITIS, HX OF 12/05/2008   PCP:  [name], MD Pharmacy:   [place], [place] - [address] [place] [address] Phone: [phone] Fax: [phone]     Social Determinants of Health (SDOH) Interventions    Readmission Risk Interventions     No data to display

## 2022-02-05 NOTE — Progress Notes (Signed)
Nutrition Brief Note:   Pt screened for MST (malnutrition screening tool). Palliative care is following the patient. Pt has decided to pursue comfort measures. Pt is transitioning to hospice care. RD will add some supplements for the patient. No intakes documented at this time.   Please re-consult if POC changes.   Thalia Bloodgood, RD, LDN, CNSC.

## 2022-02-05 NOTE — Progress Notes (Addendum)
Daily Progress Note   Patient Name: [name]       Date: 02/05/2022 DOB: 1955-05-11  Age: 66 y.o. MRN#: [id number] Attending Physician: Kerney Elbe, DO Primary Care Physician: [name], MD Admit Date: 02/02/2022  Reason for Consultation/Follow-up: Terminal Care  Subjective: Medical records reviewed including MAR, progress notes and labs. Patient assessed at the bedside.  No family present during my visit.  Noted patient has requested 6 doses of as needed Dilaudid for breakthrough symptoms since ordered less than 24 hours ago.  He has taken oxycodone and Ativan as scheduled.  Provided patient with update on my conversation with his daughter.  He reports his pain that currently is 6 or 7 out of 10.  He reports marked improvement of his anxiety with scheduled Ativan.  He feels drowsier, states he is still getting used to this medication combination but does not feel overly sedated.  We discussed adjusting his total daily dosage for better control of breakthrough symptoms and reviewed options including conversion of oxycodone to oral Dilaudid, ensuring his understanding of the risks and benefits of opioid use.  He is agreeable.  We discussed the option to schedule Robinul given excessive secretions are continuing.  He states this is normal for him and would prefer to avoid more scheduled medications.  Encouraged him to request as needed if secretions become troublesome.  Questions and concerns addressed. PMT will continue to support holistically.  Addendum: Received update from RN that patient was having increased agitation and difficulty breathing.  Patient assessed at the bedside.  He is restless, reports feeling excessively hot and unable to get a good breath and.  Advised patient  that it seems he is having a panic attack and counseled on the option of trying a dose of IV Ativan.  He is agreeable to trying this.  He also now remembers that it was Xanax that he was taking outpatient and he prefers this to p.o. Ativan.  Will continue p.o. Ativan for now given ongoing use of as needed IV Ativan.  Can use Xanax in the outpatient setting again with hospice.  Will also increase as needed IV Dilaudid to 1 mg every hour.  If this still does not achieve symptom control, will need to escalate to a Dilaudid drip and delay pending discharge tomorrow.  Length of Stay: 3  Physical Exam Vitals and nursing note reviewed.  Constitutional:      Appearance: He is ill-appearing.     Comments: Excessive secretions audible with respirations. 3L O2 [place]  Pulmonary:     Effort: Tachypnea present.  Neurological:     Mental Status: He is alert, oriented to person, place, and time and easily aroused.  Psychiatric:        Behavior: Behavior is cooperative.            Vital Signs: BP (!) 110/99 (BP Location: Right Arm)   Pulse (!) 108   Temp 97.9 F (36.6 C) (Oral)   Resp (!) 22   Ht 6' (1.829 m)   Wt 77.1 kg   SpO2 95%   BMI 23.06 kg/m  SpO2: SpO2: 95 % O2 Device: O2 Device: Nasal Cannula O2 Flow Rate: O2 Flow Rate (L/min): 2 L/min      Palliative Assessment/Data: 30%   Palliative Care Assessment & Plan   Patient Profile: 66 y.o. male  with past medical history of stage IIIb non-small cell lung cancer chronic diastolic heart failure, COPD, type 2 diabetes mellitus, anemia of chronic disease associate with baseline hemoglobin 7-9, acquired hypothyroidism  admitted on 02/02/2022 with generalized weakness from SNF.    Patient decided to stop chemotherapy and radiation on 11/13 and is now interested in comfort measures and hospice care.  PMT has been consulted to assist with goals of care conversation.  Assessment: End-of-life care  Recommendations/Plan: Continue DNR Continue  comfort care with adjustments to medications as below Will discontinue scheduled oxycodone and order Dilaudid 4 mg p.o. every 4 hours Continue Dilaudid IV 0.5 mg every 2 hours for breakthrough symptoms Psychosocial and emotional support provided Patient remains interested in return to [facility] with hospice when this can be coordinated Psychosocial and emotional support provided PMT will continue to follow and support   Prognosis:  < 6 months  Discharge Planning: [place] with Hospice  Care plan was discussed with patient    MDM high Prolonged/additional time: 25 minutes       [place], PA-C  Palliative Medicine Team Team phone # [phone]  Thank you for allowing the Palliative Medicine Team to assist in the care of this patient. Please utilize secure chat with additional questions, if there is no response within 30 minutes please call the above phone number.  Palliative Medicine Team providers are available by phone from 7am to 7pm daily and can be reached through the team cell phone.  Should this patient require assistance outside of these hours, please call the patient's attending physician.

## 2022-02-06 DIAGNOSIS — J3089 Other allergic rhinitis: Secondary | ICD-10-CM | POA: Diagnosis not present

## 2022-02-06 DIAGNOSIS — Z7401 Bed confinement status: Secondary | ICD-10-CM | POA: Diagnosis not present

## 2022-02-06 DIAGNOSIS — R404 Transient alteration of awareness: Secondary | ICD-10-CM | POA: Diagnosis not present

## 2022-02-06 DIAGNOSIS — J441 Chronic obstructive pulmonary disease with (acute) exacerbation: Secondary | ICD-10-CM | POA: Diagnosis not present

## 2022-02-06 DIAGNOSIS — D72828 Other elevated white blood cell count: Secondary | ICD-10-CM | POA: Diagnosis not present

## 2022-02-06 DIAGNOSIS — F411 Generalized anxiety disorder: Secondary | ICD-10-CM | POA: Diagnosis not present

## 2022-02-06 DIAGNOSIS — Z8709 Personal history of other diseases of the respiratory system: Secondary | ICD-10-CM | POA: Diagnosis not present

## 2022-02-06 DIAGNOSIS — J189 Pneumonia, unspecified organism: Secondary | ICD-10-CM | POA: Diagnosis not present

## 2022-02-06 DIAGNOSIS — I959 Hypotension, unspecified: Secondary | ICD-10-CM | POA: Diagnosis not present

## 2022-02-06 DIAGNOSIS — E119 Type 2 diabetes mellitus without complications: Secondary | ICD-10-CM | POA: Diagnosis not present

## 2022-02-06 DIAGNOSIS — Z7189 Other specified counseling: Secondary | ICD-10-CM | POA: Diagnosis not present

## 2022-02-06 DIAGNOSIS — D638 Anemia in other chronic diseases classified elsewhere: Secondary | ICD-10-CM | POA: Diagnosis not present

## 2022-02-06 DIAGNOSIS — Z515 Encounter for palliative care: Secondary | ICD-10-CM | POA: Diagnosis not present

## 2022-02-06 DIAGNOSIS — E871 Hypo-osmolality and hyponatremia: Secondary | ICD-10-CM | POA: Diagnosis not present

## 2022-02-06 DIAGNOSIS — E860 Dehydration: Secondary | ICD-10-CM | POA: Diagnosis not present

## 2022-02-06 DIAGNOSIS — D508 Other iron deficiency anemias: Secondary | ICD-10-CM | POA: Diagnosis not present

## 2022-02-06 DIAGNOSIS — E039 Hypothyroidism, unspecified: Secondary | ICD-10-CM | POA: Diagnosis not present

## 2022-02-06 DIAGNOSIS — F419 Anxiety disorder, unspecified: Secondary | ICD-10-CM | POA: Diagnosis not present

## 2022-02-06 DIAGNOSIS — R531 Weakness: Secondary | ICD-10-CM | POA: Diagnosis not present

## 2022-02-06 DIAGNOSIS — R3589 Other polyuria: Secondary | ICD-10-CM | POA: Diagnosis not present

## 2022-02-06 DIAGNOSIS — I5032 Chronic diastolic (congestive) heart failure: Secondary | ICD-10-CM | POA: Diagnosis not present

## 2022-02-06 DIAGNOSIS — R4182 Altered mental status, unspecified: Secondary | ICD-10-CM | POA: Diagnosis not present

## 2022-02-06 DIAGNOSIS — R918 Other nonspecific abnormal finding of lung field: Secondary | ICD-10-CM | POA: Diagnosis not present

## 2022-02-06 DIAGNOSIS — N179 Acute kidney failure, unspecified: Secondary | ICD-10-CM | POA: Diagnosis not present

## 2022-02-06 DIAGNOSIS — J181 Lobar pneumonia, unspecified organism: Secondary | ICD-10-CM | POA: Diagnosis not present

## 2022-02-06 DIAGNOSIS — C3490 Malignant neoplasm of unspecified part of unspecified bronchus or lung: Secondary | ICD-10-CM | POA: Diagnosis not present

## 2022-02-06 MED ORDER — VENLAFAXINE HCL 75 MG PO TABS: 75.0000 mg | ORAL_TABLET | Freq: Every day | ORAL | Status: DC

## 2022-02-06 MED ORDER — GLYCOPYRROLATE 1 MG PO TABS
1.0000 mg | ORAL_TABLET | ORAL | Status: DC
  Administered 2022-02-06: 1 mg via ORAL
  Filled 2022-02-06 (×3): qty 1

## 2022-02-06 MED ORDER — LORAZEPAM 1 MG PO TABS: 1.0000 mg | ORAL_TABLET | Freq: Four times a day (QID) | ORAL | 0 refills | Status: DC

## 2022-02-06 MED ORDER — BIOTENE DRY MOUTH MT LIQD: 15.0000 mL | OROMUCOSAL | 0 refills | Status: DC | PRN

## 2022-02-06 MED ORDER — ALPRAZOLAM 0.5 MG PO TABS
0.5000 mg | ORAL_TABLET | Freq: Three times a day (TID) | ORAL | Status: DC | PRN
  Administered 2022-02-06: 0.5 mg via ORAL
  Filled 2022-02-06: qty 1

## 2022-02-06 MED ORDER — BISACODYL 10 MG RE SUPP: 10.0000 mg | Freq: Every day | RECTAL | 0 refills | Status: DC | PRN

## 2022-02-06 MED ORDER — HYDROMORPHONE HCL 2 MG PO TABS: 6.0000 mg | ORAL_TABLET | ORAL | 0 refills | Status: DC

## 2022-02-06 MED ORDER — GLYCOPYRROLATE 1 MG PO TABS: 1.0000 mg | ORAL_TABLET | ORAL | 0 refills | Status: DC

## 2022-02-06 MED ORDER — HYDROMORPHONE HCL 2 MG PO TABS
6.0000 mg | ORAL_TABLET | ORAL | Status: DC
  Administered 2022-02-06: 6 mg via ORAL
  Filled 2022-02-06: qty 3

## 2022-02-06 MED ORDER — HYDROMORPHONE HCL 4 MG PO TABS: 4.0000 mg | ORAL_TABLET | ORAL | 0 refills | Status: DC

## 2022-02-06 MED ORDER — QUETIAPINE FUMARATE 100 MG PO TABS: 100.0000 mg | ORAL_TABLET | Freq: Every day | ORAL | Status: DC

## 2022-02-06 MED ORDER — POLYVINYL ALCOHOL 1.4 % OP SOLN: 1.0000 [drp] | Freq: Four times a day (QID) | OPHTHALMIC | 0 refills | Status: DC | PRN

## 2022-02-06 MED ORDER — ENSURE ENLIVE PO LIQD: 237.0000 mL | Freq: Two times a day (BID) | ORAL | 12 refills | Status: DC

## 2022-02-06 MED ORDER — LEVOFLOXACIN 750 MG PO TABS: 750.0000 mg | ORAL_TABLET | ORAL | 0 refills | Status: AC

## 2022-02-06 MED ORDER — ALPRAZOLAM 0.5 MG PO TABS: 0.5000 mg | ORAL_TABLET | Freq: Three times a day (TID) | ORAL | 0 refills | Status: DC | PRN

## 2022-02-06 NOTE — Progress Notes (Signed)
CSW attempted to reach admissions at [facility] twice without success. CSW attempted to reach nursing at facility without success. CSW was able to inform secretary Levada Dy of patient's return.  Number to call for report is [phone]. RN to call report and PTAR when ready.  Madilyn Fireman, MSW, LCSW Transitions of Care  Clinical Social Worker II [phone]

## 2022-02-06 NOTE — TOC Transition Note (Signed)
Transition of Care [facility]) - CM/SW Discharge Note   Patient Details  Name: [name] MRN: [id number] Date of Birth: 08-21-1955  [facility] CM/SW Contact:  Bartholomew Crews, RN Phone Number: [phone] 02/06/2022, 11:47 AM   Clinical Narrative:     Patient to transition back to [facility] today. Discussed transition with liaison at [facility]. She visited patient. Patient to admit to hospice services later today when he arrives at [facility].    Barriers to Discharge: Continued Medical Work up   Patient Goals and CMS Choice   CMS Medicare.gov Compare Post Acute Care list provided to:: Patient Choice offered to / list presented to : Patient  Discharge Placement                       Discharge Plan and Services   Discharge Planning Services: CM Consult Post Acute Care Choice: Hospice          DME Arranged: N/A DME Agency: NA       HH Arranged: NA HH Agency: NA        Social Determinants of Health (SDOH) Interventions     Readmission Risk Interventions     No data to display

## 2022-02-06 NOTE — Progress Notes (Signed)
Attempted to call nurse report to [facility]. No one available to take call at this time.

## 2022-02-06 NOTE — Discharge Summary (Addendum)
Physician Discharge Summary   Patient: [name] MRN: [id number] DOB: 10-11-1955  Admit date:     02/02/2022  Discharge date: 02/06/22  Discharge Physician: Raiford Noble, DO   PCP: [name], MD   Recommendations at discharge:    Further Care per Hospice Protocol   Discharge Diagnoses: Principal Problem:   Generalized weakness Active Problems:   Acquired hypothyroidism   DM2 (diabetes mellitus, type 2) ([place])   HLD (hyperlipidemia)   Anemia of chronic disease   Hypocalcemia   CAP (community acquired pneumonia)   Hypokalemia   AKI (acute kidney injury) ([place])   Dehydration   Chronic diastolic CHF (congestive heart failure) ([place])   History of COPD   GAD (generalized anxiety disorder)  Resolved Problems:   * No resolved hospital problems. *  Hospital Course: Patient is a 66 year old Caucasian male with past medical history significant for non-small cell lung cancer, chronic diastolic CHF, COPD, diabetes mellitus type 2, history of anemia of chronic disease with a baseline hemoglobin 7-9, acquired hypothyroidism as well as other comorbidities who presented to the ED with generalized weakness of 3 to 4 days in the absence of any acute focal deficits or paresthesias. Patient noted that last 3 to 4 days he had a progressively mild productive cough with no hemoptysis and denies any fevers, chills, and felt more weak with difficulty ambulating and states that he did not fall and almost multiple times during his generalized weakness. Had no worsening peripheral edema and sees Dr. Earlie Server in outpatient setting for his lung cancer. In the ED he was brought in and had a Tmax of 99.5 with a heart rate of 111 and was given IV fluid boluses with lactated Ringer's and initiated on Levaquin. He is admitted for generalized weakness and community-acquired pneumonia and also admitted for AKI, dehydration and lab abnormalities including hypocalcemia and hypokalemia.     Respiratory status was  still tenuous and PCCM was consulted and after discussion patient decided to be DNR.  Palliative care was consulted for further goals of care discussion and after further discussion patient wishes to be comfort care and wished to transition to comfort measures today but would like to continue Levaquin until he has completed his entire course but the main priority is to focus on comfort.  TOC was consulted by the palliative care team for referral to hospice at [facility].  All medications not in the patient's comfort were discontinued except the antibiotics.  Assessment and Plan:  Generalized weakness -Likely in the setting of electrolyte abnormalities and dysfunction -Will need PT and OT to further evaluate and treat and will get a fluid hydration.  No infectious process noted -Urinalysis is not suggestive of UTI -Patient has been likely dehydrated we will continue IV for hydration and check a TSH and mag level Continue to hold gabapentin -Patient may have a community-acquired pneumonia and so is getting Levaquin   Hypocalcemia -Being replete with IV calcium gluconate Picture monitor trend and repeat CMP in the a.m. and hold Lasix for now -Electrolytes were low again so we will replete with another IV calcium gluconate   Community-acquired pneumonia -Has a mildly associated productive cough and an oxygen requirement -Blood cultures x 2 pending and has been initiated on levofloxacin From -Influenza panel negative -Repeat chest x-ray in the a.m. -Will make further adjustments to his regimen and continue Trelegy but will add Xopenex and guaifenesin 1200 malaise.  Twice daily, incentive spirometry as well as flutter valve -Repeat chest x-ray  in the a.m. -Will need an amatory home O2 screen prior to discharge however.  Patient is now full comfort care.  Given that his respiratory status is not really improving pulmonary was consulted for further evaluation and made the patient DNR and made some  changes   Anemia of chronic disease The patient's hemoglobin/hematocrit is now 7.6/20.2 yesterday and today is now 7.5/23.7 From anemia panel done and showed an iron level of 22, UIBC 104, TIBC 126, saturation ratios of 18%, ferritin level 1122 -Continue with ferrous sulfate 3 x 1 mg p.o. daily -Continue to monitor for signs and symptoms of bleeding; no overt bleeding noted For repeat CBC in a.m.   Elevated CK -IV fluid hydration given but will resume normal saline at 75 mL/h and will need to monitor and repeat CK in the morning and that his CK on admission was greater than 3000 and is now 1900   Hypothyroidism Continue with levothyroxine   Hypokalemia -Replete as potassium is now 2.9   AKI -Suggestive of dehydration and elevated CK -Avoid nephrotoxic medications, contrast dyes and hold Lasix as well as renally adjust medications and avoid hypotension to ensure adequate renal perfusion -Resume IV fluid hydration at normal saline at 75 MLS per hour -Strict I's and O's -Patient's BUNs/creatinine has gone from 31/1.43 is now 22/1.12 and is now 9/1.10 -Will not continue to monitor and trend given the patient is being transitioned to comfort care   GAD -Continue home Effexor and continue Ativan   Chronic diastolic CHF -Most recent echocardiogram was performed in October 2023 which showed an LVEF of 55 to 60% with indeterminate diastolic parameters -Cautious IV fluid hydration and will need to continue monitor for signs and symptoms of volume overload: Will stop his IV fluid hydration now -Holding home Lasix for now   COPD -Continue Trelegy and added Xopenex -Also on as needed albuterol and also added guaifenesin, flutter valve and incentive spirometry   Hyperlipidemia -Held his pravastatin given his elevated CPK but now discontinued as patient is being transitioned to Hospice    Hypomagnesemia -Patient's magnesium level was 0.6 and improved to 1.1 on last check -Replete with IV  mag sulfate 4 g again yesterday prior to him becoming Comfort Care  -Will not continue to monitor and trend and repeat magnesium level in the a.m. given the patient is being transitioned to comfort care   After further goals of care discussion with pulmonary and palliative care medicine patient has elected to go comfort care and wants to go to hospice at [facility].  All medications not in the patient's comfort have been discontinued except antibiotics given the patient's request.  The focus is is to shift towards comfort and he has been placed on oxycodone 10 mg p.o. every 4, Ativan 1 mg p.o. 4 times daily, IV Dilaudid as needed every 2 hours for breakthrough symptoms, IV Ativan every 2 hours for breakthrough symptoms, Robinul for as needed excessive secretions, Liquifilm Tears for dry eyes as well as comfort feeding  Consultants: PCCM; Palliative Care Procedures performed: As Delineated as above   Disposition: Hospice care  Diet recommendation:  Regular diet  DISCHARGE MEDICATION: Allergies as of 02/06/2022       Reactions   Rocephin [ceftriaxone] Anaphylaxis   Zofran [ondansetron Hcl] Swelling   sts had tongue swelling and trouble breathing   Ampicillin Other (See Comments)   Per MAR    Lipitor [atorvastatin] Other (See Comments)   Per MAR    Prochlorperazine Other (  See Comments)   Per MAR    Toradol [ketorolac Tromethamine] Nausea And Vomiting   Pt states he is not allergic to ibuprofen   Tramadol Nausea And Vomiting   Not listed on MAR         Medication List     STOP taking these medications    allopurinol 100 MG tablet Commonly known as: ZYLOPRIM   aspirin EC 81 MG tablet   calcium-vitamin D 500-5 MG-MCG tablet Commonly known as: OSCAL WITH D   ferrous sulfate 325 (65 FE) MG tablet   furosemide 20 MG tablet Commonly known as: LASIX   gabapentin 400 MG capsule Commonly known as: NEURONTIN   HumaLOG KwikPen 100 UNIT/ML KwikPen Generic drug: insulin  lispro   ipratropium-albuterol 0.5-2.5 (3) MG/3ML Soln Commonly known as: DUONEB   levothyroxine 75 MCG tablet Commonly known as: SYNTHROID   LORazepam 2 MG tablet Commonly known as: ATIVAN   metFORMIN 500 MG tablet Commonly known as: GLUCOPHAGE   metoprolol succinate 25 MG 24 hr tablet Commonly known as: TOPROL-XL   omeprazole 20 MG capsule Commonly known as: PRILOSEC   Oxycodone HCl 10 MG Tabs   pravastatin 80 MG tablet Commonly known as: PRAVACHOL   prochlorperazine 10 MG tablet Commonly known as: COMPAZINE   saccharomyces boulardii 250 MG capsule Commonly known as: FLORASTOR   temazepam 30 MG capsule Commonly known as: RESTORIL   therapeutic multivitamin-minerals tablet       TAKE these medications    acetaminophen 325 MG tablet Commonly known as: TYLENOL Take 650 mg by mouth every 6 (six) hours as needed (pain.).   albuterol 108 (90 Base) MCG/ACT inhaler Commonly known as: VENTOLIN HFA Inhale 2 puffs into the lungs every 6 (six) hours as needed for wheezing or shortness of breath.   albuterol (2.5 MG/3ML) 0.083% nebulizer solution Commonly known as: PROVENTIL Take 2.5 mg by nebulization every 6 (six) hours as needed for wheezing or shortness of breath.   ALPRAZolam 0.5 MG tablet Commonly known as: XANAX Take 1 tablet (0.5 mg total) by mouth 3 (three) times daily as needed for anxiety or sleep (dyspnea/agitation).   antiseptic oral rinse Liqd Apply 15 mLs topically as needed for dry mouth.   bisacodyl 10 MG suppository Commonly known as: DULCOLAX Place 1 suppository (10 mg total) rectally daily as needed for moderate constipation.   cyclobenzaprine 10 MG tablet Commonly known as: FLEXERIL Take 10 mg by mouth 3 (three) times daily. (0900, 1300 & 2100)   feeding supplement Liqd Take 237 mLs by mouth 2 (two) times daily between meals.   Glucagon Emergency 1 MG Kit Inject 1 mg as directed as needed (hypoglycemia).   glycopyrrolate 1 MG  tablet Commonly known as: ROBINUL Take 1 tablet (1 mg total) by mouth every 4 (four) hours.   guaiFENesin 600 MG 12 hr tablet Commonly known as: MUCINEX Take 600 mg by mouth every 12 (twelve) hours. For 5 days starting 02/01/22   HYDROmorphone 2 MG tablet Commonly known as: DILAUDID Take 3 tablets (6 mg total) by mouth every 4 (four) hours.   levofloxacin 750 MG tablet Commonly known as: LEVAQUIN Take 1 tablet (750 mg total) by mouth daily for 2 doses. What changed:  medication strength how much to take when to take this   OXYGEN Inhale 2-4 L into the lungs as needed (SOB or O2 stat below 90%).   polyethylene glycol 17 g packet Commonly known as: MIRALAX / GLYCOLAX Take 17 g by mouth daily as  needed for mild constipation.   polyvinyl alcohol 1.4 % ophthalmic solution Commonly known as: LIQUIFILM TEARS Place 1 drop into both eyes 4 (four) times daily as needed for dry eyes.   QUEtiapine 100 MG tablet Commonly known as: SEROQUEL Take 1 tablet (100 mg total) by mouth at bedtime. What changed:  medication strength how much to take   Trelegy Ellipta 200-62.5-25 MCG/ACT Aepb Generic drug: Fluticasone-Umeclidin-Vilant Inhale 1 puff into the lungs daily.   venlafaxine 75 MG tablet Commonly known as: EFFEXOR Take 1 tablet (75 mg total) by mouth daily with breakfast. What changed:  when to take this additional instructions               Durable Medical Equipment  (From admission, onward)           Start     Ordered   02/05/22 1651  For home use only DME oxygen  Once       Question Answer Comment  Length of Need 6 Months   Mode or (Route) Nasal cannula   Liters per Minute 2   Frequency Continuous (stationary and portable oxygen unit needed)   Oxygen delivery system Gas      02/05/22 1650            Contact information for after-discharge care     [facility] .   Service: Skilled Nursing Contact information: [place] [address] [phone]                   Discharge Exam: Danley Danker Weights   02/02/22 2039  Weight: 77.1 kg   Vitals:   02/05/22 0844 02/05/22 1949  BP:  (!) 121/90  Pulse:  (!) 116  Resp:  (!) 22  Temp:  99.3 F (37.4 C)  SpO2: 95% 100%   Examination: Physical Exam:  Constitutional: Chronically ill-appearing Caucasian male in NAD resting and asleep Respiratory: Diminished to auscultation bilaterally with coarse breath sounds with rhonchi and some slight expiratory wheezing, no rales crackles. Normal respiratory effort and patient is not tachypenic. No accessory muscle use. Wearing supplemental O2 via [place] Cardiovascular: RRR, no murmurs / rubs / gallops. S1 and S2 auscultated. Mild extremity edema.  Abdomen: Soft, non-tender, non-distended. Bowel sounds positive.  GU: Deferred. Musculoskeletal: No clubbing / cyanosis of digits/nails. No joint deformity upper and lower extremities. Skin: No rashes, lesions, ulcers. No induration; Warm and dry.  Neurologic: Somnolent and drowsy and asleep Psychiatric: Unable to assess due to the patient sleeping   Condition at discharge:  Guarded  The results of significant diagnostics from this hospitalization (including imaging, microbiology, ancillary and laboratory) are listed below for reference.   Imaging Studies: DG CHEST PORT 1 VIEW  Result Date: 02/04/2022 CLINICAL DATA:  66 year old male with history of shortness of breath. EXAM: PORTABLE CHEST 1 VIEW COMPARISON:  Chest x-ray 02/02/2022. FINDINGS: Opacity at the right base which may reflect atelectasis and/or consolidation. Small right pleural effusion. Left lung is clear. No left pleural effusion. No pneumothorax. Cephalization of the pulmonary vasculature, without frank pulmonary edema. Mild cardiomegaly. Upper mediastinal contours are within normal limits. IMPRESSION: 1. Atelectasis and/or consolidation in the right lung base with small right  pleural effusion. 2. Cardiomegaly with pulmonary venous congestion, but no frank pulmonary edema. Electronically Signed   By: Vinnie Langton M.D.   On: 02/04/2022 06:02   DG Chest Port 1 View  Result Date: 02/02/2022 CLINICAL DATA:  Questionable sepsis  EXAM: PORTABLE CHEST 1 VIEW COMPARISON:  CT chest 10/26/2021.  Chest x-ray 11/24/2020. FINDINGS: Small right pleural effusion with right lower lung airspace opacities extending to the right hilum appears stable. Cardiomediastinal silhouette is within normal limits. No evidence for pneumothorax. No acute fractures are seen. Stable nodular density in the left lower lung. IMPRESSION: 1. Stable small right pleural effusion with right lower lung airspace opacities extending to the right hilum. 2. Stable nodular density in the left lower lung. Electronically Signed   By: Ronney Asters M.D.   On: 02/02/2022 21:30    Microbiology: Results for orders placed or performed during the hospital encounter of 02/02/22  Blood Culture (routine x 2)     Status: None (Preliminary result)   Collection Time: 02/02/22  8:42 PM   Specimen: BLOOD  Result Value Ref Range Status   Specimen Description BLOOD LEFT ANTECUBITAL  Final   Special Requests   Final    BOTTLES DRAWN AEROBIC AND ANAEROBIC Blood Culture adequate volume   Culture   Final    NO GROWTH 4 DAYS Performed at [facility], [place] [address], [place], [place] [address]    Report Status PENDING  Incomplete  Resp panel by RT-PCR (RSV, Flu A&B, Covid) Anterior Nasal Swab     Status: None   Collection Time: 02/02/22  9:03 PM   Specimen: Anterior Nasal Swab  Result Value Ref Range Status   SARS Coronavirus 2 by RT PCR NEGATIVE NEGATIVE Final    Comment: (NOTE) SARS-CoV-2 target nucleic acids are NOT DETECTED.  The SARS-CoV-2 RNA is generally detectable in upper respiratory specimens during the acute phase of infection. The lowest concentration of SARS-CoV-2 viral copies this assay can detect is 138  copies/mL. A negative result does not preclude SARS-Cov-2 infection and should not be used as the sole basis for treatment or other patient management decisions. A negative result may occur with  improper specimen collection/handling, submission of specimen other than nasopharyngeal swab, presence of viral mutation(s) within the areas targeted by this assay, and inadequate number of viral copies(<138 copies/mL). A negative result must be combined with clinical observations, patient history, and epidemiological information. The expected result is Negative.  Fact Sheet for Patients:  EntrepreneurPulse.com.au  Fact Sheet for Healthcare Providers:  IncredibleEmployment.be  This test is no t yet approved or cleared by the [place] FDA and  has been authorized for detection and/or diagnosis of SARS-CoV-2 by FDA under an Emergency Use Authorization (EUA). This EUA will remain  in effect (meaning this test can be used) for the duration of the COVID-19 declaration under Section 564(b)(1) of the Act, 21 U.S.C.section 360bbb-3(b)(1), unless the authorization is terminated  or revoked sooner.       Influenza A by PCR NEGATIVE NEGATIVE Final   Influenza B by PCR NEGATIVE NEGATIVE Final    Comment: (NOTE) The Xpert Xpress SARS-CoV-2/FLU/RSV plus assay is intended as an aid in the diagnosis of influenza from Nasopharyngeal swab specimens and should not be used as a sole basis for treatment. Nasal washings and aspirates are unacceptable for Xpert Xpress SARS-CoV-2/FLU/RSV testing.  Fact Sheet for Patients: EntrepreneurPulse.com.au  Fact Sheet for Healthcare Providers: IncredibleEmployment.be  This test is not yet approved or cleared by the [place] FDA and has been authorized for detection and/or diagnosis of SARS-CoV-2 by FDA under an Emergency Use Authorization (EUA). This EUA will remain in effect (meaning  this test can be used) for the duration of the COVID-19 declaration under Section  564(b)(1) of the Act, 21 U.S.C. section 360bbb-3(b)(1), unless the authorization is terminated or revoked.     Resp Syncytial Virus by PCR NEGATIVE NEGATIVE Final    Comment: (NOTE) Fact Sheet for Patients: EntrepreneurPulse.com.au  Fact Sheet for Healthcare Providers: IncredibleEmployment.be  This test is not yet approved or cleared by the [place] FDA and has been authorized for detection and/or diagnosis of SARS-CoV-2 by FDA under an Emergency Use Authorization (EUA). This EUA will remain in effect (meaning this test can be used) for the duration of the COVID-19 declaration under Section 564(b)(1) of the Act, 21 U.S.C. section 360bbb-3(b)(1), unless the authorization is terminated or revoked.  Performed at [facility], [place] [address] [place], [place] [address]   Urine Culture     Status: Abnormal   Collection Time: 02/03/22 12:15 AM   Specimen: In/Out Cath Urine  Result Value Ref Range Status   Specimen Description IN/OUT CATH URINE  Final   Special Requests NONE  Final   Culture (A)  Final    4,000 COLONIES/mL LEUCONOSTOC MESENTEROIDES Standardized susceptibility testing for this organism is not available. Performed at [facility], [place] [address] [place], [place] [address]    Report Status 02/05/2022 FINAL  Final  Blood Culture (routine x 2)     Status: None (Preliminary result)   Collection Time: 02/03/22  3:41 AM   Specimen: BLOOD RIGHT HAND  Result Value Ref Range Status   Specimen Description BLOOD RIGHT HAND  Final   Special Requests   Final    BOTTLES DRAWN AEROBIC ONLY Blood Culture results may not be optimal due to an inadequate volume of blood received in culture bottles   Culture   Final    NO GROWTH 3 DAYS Performed at [facility], [place] [address] [place], [place] [address]    Report Status PENDING  Incomplete    Labs: CBC: Recent Labs  Lab 02/02/22 2042 02/03/22 0331 02/04/22 0417  WBC 6.1 7.2 7.2  NEUTROABS 4.8  --  6.4  HGB 7.6* 7.6* 7.5*  HCT 22.8* 23.2* 23.7*  MCV 82.9 84.4 87.8  PLT 145* 163 432   Basic Metabolic Panel: Recent Labs  Lab 02/02/22 2042 02/03/22 0331 02/04/22 0417  NA 135 136 133*  K 3.4* 3.5 2.9*  CL 96* 100 96*  CO2 23 21* 25  GLUCOSE 94 84 186*  BUN 31* 22 9  CREATININE 1.43* 1.12 1.10  CALCIUM 5.1* 5.7* 5.8*  MG  --  0.6* 1.1*  PHOS  --  2.9 2.1*   Liver Function Tests: Recent Labs  Lab 02/02/22 2042 02/03/22 0331 02/04/22 0417  AST 82* 72* 64*  ALT _0 ALKPHOS 78 74 77  BILITOT 0.7 0.9 0.6  PROT 6.3* 6.1* 5.7*  ALBUMIN 2.3* 2.2* 2.0*   CBG: Recent Labs  Lab 02/03/22 1702 02/03/22 2234 02/04/22 0805 02/04/22 1126 02/04/22 1245  GLUCAP 106* 180* 197* 166* 160*   Discharge time spent: greater than 30 minutes.  Signed: Raiford Noble, DO [facility] 02/06/2022

## 2022-02-06 NOTE — Progress Notes (Signed)
Daily Progress Note   Patient Name: [name]       Date: 02/06/2022 DOB: [birth year]  Age: 66 y.o. MRN#: [id number] Attending Physician: Kerney Elbe, DO Primary Care Physician: [name], MD Admit Date: 02/02/2022  Reason for Consultation/Follow-up: Terminal Care  Subjective: Medical records reviewed including MAR, progress notes and labs. Patient assessed at the bedside.  His mood is more depressed today, stating he would rather "you just shoot me" rather than a prolonged dying process.  No family present during my visit.  Discussed with RN.  We discussed patient's symptoms since exacerbation yesterday evening and adjustments to as needed medications.  He initially reports he is feeling better, with improvement of back pain and rib pain, also with fear that pain will return soon.  His dyspnea and anxiety continue to be troublesome for him.  He understands that hospice will be able to continue titrating his medications for effectiveness.  He is agreeable to further titration today while he is awaiting discharge.  At the end of our conversation, patient reports pain is not an 8 out of 10.  Noted that his secretions seem to be improved and recommended scheduling Robinul for more effectiveness.  He is not feeling confident that this would help but is open to trying it.  Patient has taken equivalent of 28.75 mg of oral Dilaudid in the past 24 hours. He has taken 10 mg of total Ativan, IV or p.o.  Length of Stay: 4  Physical Exam Vitals and nursing note reviewed.  Constitutional:      Appearance: He is ill-appearing.     Interventions: Nasal cannula in place.     Comments: Oral secretions improving. 2L O2 [place]  Pulmonary:     Effort: Tachypnea present. No accessory muscle usage.   Neurological:     Mental Status: He is alert, oriented to person, place, and time and easily aroused.  Psychiatric:        Mood and Affect: Mood is depressed.        Behavior: Behavior is cooperative.            Vital Signs: BP (!) 121/90 (BP Location: Right Arm)   Pulse (!) 116   Temp 99.3 F (37.4 C) (Oral)   Resp (!) 22   Ht 6' (1.829 m)  Wt 77.1 kg   SpO2 100%   BMI 23.06 kg/m  SpO2: SpO2: 100 % O2 Device: O2 Device: Nasal Cannula O2 Flow Rate: O2 Flow Rate (L/min): 2 L/min      Palliative Assessment/Data: 30%   Palliative Care Assessment & Plan   Patient Profile: 66 y.o. male  with past medical history of stage IIIb non-small cell lung cancer chronic diastolic heart failure, COPD, type 2 diabetes mellitus, anemia of chronic disease associate with baseline hemoglobin 7-9, acquired hypothyroidism  admitted on 02/02/2022 with generalized weakness from SNF.    Patient decided to stop chemotherapy and radiation on 11/13 and is now interested in comfort measures and hospice care.  PMT has been consulted to assist with goals of care conversation.  Assessment: End-of-life care  Recommendations/Plan: Continue DNR Continue comfort care with adjustments to medications as below Increased dosage of Dilaudid, 6 mg p.o. every 4 hours Scheduled Robinul 1 mg p.o. every 4 hours Ordered Xanax 0.5 mg 3 times daily per patient preference in anticipation of discharge Continue scheduled Ativan and IV Ativan as needed while admitted Continue Dilaudid IV 1 mg every 1 hour for breakthrough symptoms until discharge Psychosocial and emotional support provided Patient to return to [facility] with hospice today Psychosocial and emotional support provided PMT will continue to follow and support as needed   Prognosis:  < 6 months  Discharge Planning: [facility] with Hospice  Care plan was discussed with patient, RN, Dr. Alfredia Ferguson    MDM high   Kehinde Totzke Johnnette Litter,  PA-C  Palliative Medicine Team Team phone # [phone]  Thank you for allowing the Palliative Medicine Team to assist in the care of this patient. Please utilize secure chat with additional questions, if there is no response within 30 minutes please call the above phone number.  Palliative Medicine Team providers are available by phone from 7am to 7pm daily and can be reached through the team cell phone.  Should this patient require assistance outside of these hours, please call the patient's attending physician.

## 2022-02-07 DIAGNOSIS — C3490 Malignant neoplasm of unspecified part of unspecified bronchus or lung: Secondary | ICD-10-CM | POA: Diagnosis not present

## 2022-02-07 DIAGNOSIS — J181 Lobar pneumonia, unspecified organism: Secondary | ICD-10-CM | POA: Diagnosis not present

## 2022-02-07 DIAGNOSIS — J441 Chronic obstructive pulmonary disease with (acute) exacerbation: Secondary | ICD-10-CM | POA: Diagnosis not present

## 2022-02-07 DIAGNOSIS — F419 Anxiety disorder, unspecified: Secondary | ICD-10-CM | POA: Diagnosis not present

## 2022-02-07 LAB — CULTURE, BLOOD (ROUTINE X 2)
Culture: NO GROWTH
Special Requests: ADEQUATE

## 2022-02-08 LAB — CULTURE, BLOOD (ROUTINE X 2): Culture: NO GROWTH

## 2022-02-09 DIAGNOSIS — J309 Allergic rhinitis, unspecified: Secondary | ICD-10-CM | POA: Diagnosis not present

## 2022-02-09 DIAGNOSIS — J449 Chronic obstructive pulmonary disease, unspecified: Secondary | ICD-10-CM | POA: Diagnosis not present

## 2022-02-09 DIAGNOSIS — C3490 Malignant neoplasm of unspecified part of unspecified bronchus or lung: Secondary | ICD-10-CM | POA: Diagnosis not present

## 2022-02-11 DIAGNOSIS — F419 Anxiety disorder, unspecified: Secondary | ICD-10-CM | POA: Diagnosis not present

## 2022-02-11 DIAGNOSIS — C3491 Malignant neoplasm of unspecified part of right bronchus or lung: Secondary | ICD-10-CM | POA: Diagnosis not present

## 2022-02-15 DIAGNOSIS — G894 Chronic pain syndrome: Secondary | ICD-10-CM | POA: Diagnosis not present

## 2022-02-15 DIAGNOSIS — C3491 Malignant neoplasm of unspecified part of right bronchus or lung: Secondary | ICD-10-CM | POA: Diagnosis not present

## 2022-02-15 DIAGNOSIS — R110 Nausea: Secondary | ICD-10-CM | POA: Diagnosis not present

## 2022-03-03 DIAGNOSIS — F3132 Bipolar disorder, current episode depressed, moderate: Secondary | ICD-10-CM | POA: Diagnosis not present

## 2022-03-04 DIAGNOSIS — F3131 Bipolar disorder, current episode depressed, mild: Secondary | ICD-10-CM | POA: Diagnosis not present

## 2022-03-04 DIAGNOSIS — C3491 Malignant neoplasm of unspecified part of right bronchus or lung: Secondary | ICD-10-CM | POA: Diagnosis not present

## 2022-03-04 DIAGNOSIS — J449 Chronic obstructive pulmonary disease, unspecified: Secondary | ICD-10-CM | POA: Diagnosis not present

## 2022-03-04 DIAGNOSIS — G894 Chronic pain syndrome: Secondary | ICD-10-CM | POA: Diagnosis not present

## 2022-05-23 DEATH — deceased
